# Patient Record
Sex: Female | Born: 1990
Health system: Southern US, Community
[De-identification: ages and names within clinical notes are randomized; demographics above are authoritative.]

## PROBLEM LIST (undated history)

## (undated) DIAGNOSIS — A749 Chlamydial infection, unspecified: Secondary | ICD-10-CM

## (undated) DIAGNOSIS — Z23 Encounter for immunization: Secondary | ICD-10-CM

## (undated) DIAGNOSIS — F418 Other specified anxiety disorders: Secondary | ICD-10-CM

## (undated) DIAGNOSIS — O139 Gestational [pregnancy-induced] hypertension without significant proteinuria, unspecified trimester: Secondary | ICD-10-CM

## (undated) DIAGNOSIS — C50919 Malignant neoplasm of unspecified site of unspecified female breast: Secondary | ICD-10-CM

## (undated) HISTORY — DX: Gestational (pregnancy-induced) hypertension without significant proteinuria, unspecified trimester: O13.9

## (undated) HISTORY — DX: Encounter for immunization: Z23

## (undated) HISTORY — PX: AUGMENTATION MAMMAPLASTY: SUR837

## (undated) HISTORY — DX: Other specified anxiety disorders: F41.8

## (undated) HISTORY — DX: Chlamydial infection, unspecified: A74.9

## (undated) HISTORY — PX: TONSILLECTOMY: SUR1361

## (undated) MED FILL — Fosaprepitant Dimeglumine For IV Infusion 150 MG (Base Eq): INTRAVENOUS | Qty: 5 | Status: AC

---
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
## 2013-01-15 ENCOUNTER — Observation Stay: Payer: Self-pay | Admitting: Obstetrics and Gynecology

## 2013-02-23 ENCOUNTER — Observation Stay: Payer: Self-pay | Admitting: Obstetrics and Gynecology

## 2013-02-23 LAB — PIH PROFILE
Anion Gap: 6 — ABNORMAL LOW (ref 7–16)
BUN: 3 mg/dL — ABNORMAL LOW (ref 7–18)
Co2: 22 mmol/L (ref 21–32)
Creatinine: 0.58 mg/dL — ABNORMAL LOW (ref 0.60–1.30)
EGFR (Non-African Amer.): >60
Glucose: 78 mg/dL (ref 65–99)
HCT: 32.2 % — ABNORMAL LOW (ref 35.0–47.0)
MCH: 28.4 pg (ref 26.0–34.0)
Osmolality: 271 (ref 275–301)
RBC: 3.81 10*6/uL (ref 3.80–5.20)
RDW: 12.5 % (ref 11.5–14.5)
Sodium: 138 mmol/L (ref 136–145)
Uric Acid: 4.1 mg/dL (ref 2.6–6.0)

## 2013-02-23 LAB — PROTEIN / CREATININE RATIO, URINE
Creatinine, Urine: 72.5 mg/dL (ref 30.0–125.0)
Protein, Random Urine: 16 mg/dL — ABNORMAL HIGH (ref 0–12)

## 2013-03-01 ENCOUNTER — Observation Stay: Payer: Self-pay

## 2013-03-01 LAB — PIH PROFILE
Anion Gap: 6 — ABNORMAL LOW (ref 7–16)
Calcium, Total: 8.5 mg/dL (ref 8.5–10.1)
Co2: 25 mmol/L (ref 21–32)
Creatinine: 0.71 mg/dL (ref 0.60–1.30)
EGFR (African American): >60
Glucose: 72 mg/dL (ref 65–99)
HCT: 30.4 % — ABNORMAL LOW (ref 35.0–47.0)
HGB: 10.5 g/dL — ABNORMAL LOW (ref 12.0–16.0)
MCH: 28.3 pg (ref 26.0–34.0)
MCHC: 34.6 g/dL (ref 32.0–36.0)
MCV: 82 fL (ref 80–100)
Osmolality: 271 (ref 275–301)
Platelet: 202 10*3/uL (ref 150–440)
RDW: 12.8 % (ref 11.5–14.5)
WBC: 15.8 10*3/uL — ABNORMAL HIGH (ref 3.6–11.0)

## 2013-03-01 LAB — PROTEIN / CREATININE RATIO, URINE: Protein/Creat. Ratio: 240 mg/gCREAT — ABNORMAL HIGH (ref 0–200)

## 2013-03-04 ENCOUNTER — Observation Stay: Payer: Self-pay | Admitting: Obstetrics and Gynecology

## 2013-03-04 LAB — PIH PROFILE
BUN: 4 mg/dL — ABNORMAL LOW (ref 7–18)
Chloride: 106 mmol/L (ref 98–107)
Co2: 24 mmol/L (ref 21–32)
Creatinine: 0.58 mg/dL — ABNORMAL LOW (ref 0.60–1.30)
EGFR (Non-African Amer.): >60
Glucose: 98 mg/dL (ref 65–99)
MCH: 28.2 pg (ref 26.0–34.0)
MCHC: 34.3 g/dL (ref 32.0–36.0)
Platelet: 207 10*3/uL (ref 150–440)
RBC: 3.54 10*6/uL — ABNORMAL LOW (ref 3.80–5.20)
RDW: 12.9 % (ref 11.5–14.5)
Uric Acid: 4.9 mg/dL (ref 2.6–6.0)
WBC: 15.3 10*3/uL — ABNORMAL HIGH (ref 3.6–11.0)

## 2013-03-04 LAB — PROTEIN / CREATININE RATIO, URINE
Creatinine, Urine: 115.2 mg/dL (ref 30.0–125.0)
Protein, Random Urine: 29 mg/dL — ABNORMAL HIGH (ref 0–12)

## 2013-03-05 LAB — PROTEIN, URINE, 24 HOUR
Collection Hours: 24 hours
Protein, Urine: 23 mg/dL (ref 0–12)
Total Volume: 1200 mL

## 2013-03-10 ENCOUNTER — Inpatient Hospital Stay: Payer: Self-pay

## 2013-03-11 LAB — BASIC METABOLIC PANEL
Anion Gap: 7 (ref 7–16)
BUN: 4 mg/dL — ABNORMAL LOW (ref 7–18)
Calcium, Total: 8.5 mg/dL (ref 8.5–10.1)
Co2: 26 mmol/L (ref 21–32)
Creatinine: 0.66 mg/dL (ref 0.60–1.30)
EGFR (Non-African Amer.): >60
Glucose: 72 mg/dL (ref 65–99)
Potassium: 3.2 mmol/L — ABNORMAL LOW (ref 3.5–5.1)

## 2013-03-11 LAB — SGOT (AST)(ARMC): SGOT(AST): 12 U/L — ABNORMAL LOW (ref 15–37)

## 2013-03-11 LAB — PROTEIN / CREATININE RATIO, URINE
Creatinine, Urine: 23.2 mg/dL — ABNORMAL LOW (ref 30.0–125.0)
Protein, Random Urine: 8 mg/dL (ref 0–12)

## 2013-03-11 LAB — CBC WITH DIFFERENTIAL/PLATELET
Eosinophil #: 0.1 10*3/uL (ref 0.0–0.7)
Eosinophil %: 0.6 %
HCT: 32.8 % — ABNORMAL LOW (ref 35.0–47.0)
HGB: 11.1 g/dL — ABNORMAL LOW (ref 12.0–16.0)
MCH: 27.4 pg (ref 26.0–34.0)
MCV: 81 fL (ref 80–100)
Monocyte #: 1.6 x10 3/mm — ABNORMAL HIGH (ref 0.2–0.9)
Monocyte %: 7 %
Neutrophil %: 83 %
Platelet: 214 10*3/uL (ref 150–440)
WBC: 22.2 10*3/uL — ABNORMAL HIGH (ref 3.6–11.0)

## 2013-03-14 LAB — CBC
MCH: 27.3 pg (ref 26.0–34.0)
MCV: 83 fL (ref 80–100)

## 2013-03-14 LAB — GENTAMICIN LEVEL, RANDOM: Gentamicin, Random: 1.5 ug/mL

## 2013-03-15 LAB — CBC WITH DIFFERENTIAL/PLATELET
Basophil #: 0 10*3/uL (ref 0.0–0.1)
Basophil %: 0.2 %
Eosinophil #: 0.6 10*3/uL (ref 0.0–0.7)
Eosinophil %: 3.6 %
HCT: 28.2 % — ABNORMAL LOW (ref 35.0–47.0)
HGB: 9.5 g/dL — ABNORMAL LOW (ref 12.0–16.0)
Lymphocyte %: 16.5 %
MCH: 27.2 pg (ref 26.0–34.0)
MCHC: 33.6 g/dL (ref 32.0–36.0)
Monocyte #: 1.3 x10 3/mm — ABNORMAL HIGH (ref 0.2–0.9)
Monocyte %: 7.4 %
Platelet: 257 10*3/uL (ref 150–440)
RBC: 3.48 10*6/uL — ABNORMAL LOW (ref 3.80–5.20)
RDW: 13.3 % (ref 11.5–14.5)
WBC: 17.8 10*3/uL — ABNORMAL HIGH (ref 3.6–11.0)

## 2013-06-24 HISTORY — PX: BREAST ENHANCEMENT SURGERY: SHX7

## 2014-10-14 NOTE — Op Note (Signed)
PATIENT NAME:  Eileen Hart, RIPP MR#:  [id number] DATE OF BIRTH:  December 30, 1990  DATE OF PROCEDURE:  03/12/2013  PREOPERATIVE DIAGNOSES: 1.  Term intrauterine pregnancy at 38 weeks, 5 days.  2.  Induction of labor secondary to gestational hypertension.  3.  Failure to progress with arrest of dilation at 4 cm.   POSTOPERATIVE DIAGNOSES: 1.  Term intrauterine pregnancy at 38 weeks, 5 days.  2.  Induction of labor secondary to gestational hypertension.  3.  Failure to progress with arrest of dilation at 4 cm.  4.  Asynclitic presentation of the fetus and nuchal cord x 2.   PROCEDURE PERFORMED:  Low transverse cesarean section via Pfannenstiel skin incision.   ANESTHESIA USED:  Spinal.   PRIMARY SURGEON:  Vena Austria, M.D.   ASSISTANT:  Babette Relic Brother, certified nurse midwife.   ESTIMATED BLOOD LOSS:  600 mL.   OPERATIVE FLUIDS:  500 mL.   URINE OUTPUT:  550 mL.   PREOPERATIVE ANTIBIOTICS:  2 grams of Ancef.   DRAINS OR TUBES:  Foley to gravity drainage, On-Q catheter system.   IMPLANTS:  None.   SPECIMENS REMOVED:  None.   COMPLICATIONS:  None.   INTRAOPERATIVE FINDINGS:  Normal tubes, uterus and ovaries.  Delivery resulted in the birth of a liveborn female infant weighing 2950 grams, 6 pounds, 8 ounces, Apgars 8 and 9.  There was a loose nuchal cord x 2 as well as a caput to the right of the midline suggesting asynclitic presentation.   THE PATIENT CONDITION FOLLOWING THE PROCEDURE:  Stable.   PROCEDURE IN DETAIL:  Risks, benefits and alternatives of the procedure were discussed with the patient prior to proceeding to the Operating Room.  The patient was taken to the Operating Room where spinal anesthesia was administered.  She was prepped and draped in the usual sterile fashion.  A timeout procedure was performed.  The level of anesthetic was checked and noted to be adequate before proceeding with a Pfannenstiel skin incision 2 cm above the pubic symphysis.  The incision was  carried down sharply to the level of the rectus fascia using the knife.  The fascia was incised in the midline and then extended using Mayo scissors.  The superior border of the rectus fascia was grasped with two Coker clamps.  The underlying rectus fascia and the median raphe were taken down bluntly.  The inferior border of the rectus fascia was dissected off the rectus muscle in a similar fashion with the median raphe being incised with Mayo scissors.  The midline was identified.  The peritoneum was entered bluntly.  The peritoneal incision was then extended using manual traction.  A bladder blade was placed.  Next, a bladder flap was created using Metzenbaum scissors and further developed digitally.  The bladder blade was then replaced displacing the bladder caudad.  Following this, a low transverse incision was made on the uterus.  The hysterotomy incision was entered bluntly using a hemostat.  The incision was extended using manual traction.  Upon placing the operator's hand into the hysterotomy, the infant was noted to be in the OA position, slightly asynclitic.  The vertex was grasped, flexed, brought to the incision and delivered atraumatically using fundal pressure.  There is two loose nuchal cords which were reduced prior to delivery of the body.  The infant was suctioned, cord was clamped and cut and the infant was passed to the awaiting pediatricians.  Cord blood was obtained.  The placenta was delivered using manual  extraction.  The uterus was then exteriorized, wiped clean of clots and debris using two moist laps.  The hysterotomy incision was repaired using a two layer closure of 0 Vicryl with the first being a running lock, the second a vertical imbricating.  The uterus was then returned to the abdomen.  The peritoneal gutters were wiped clean of clots and debris using two moist laps.  The hysterotomy incision was reinspected and noted to be hemostatic.  The peritoneum was closed using a running 2-0  Vicryl.  Following this, the superior border of the rectus fascia was grasped with Kocher clamps and the On-Q trocars were placed 4 cm above the incision in the midline.  The catheters were then threaded through the introducers before removing the introducers.  The fascia was then closed using a running #1 looped PDS.  The subcutaneous tissue was irrigated.  Hemostasis was achieved using the Bovie.  Skin was closed using 4-0 Monocryl.  The incision was then dressed with Steri-Strips.  The On-Q catheters were bolused with 5 mL of 0.5% bupivacaine each.  Sponge, needle, instrument counts were correct x 2.  The patient tolerated the procedure well and was taken to the recovery room in stable condition.    ____________________________ Florina OuAndreas M. Bonney AidStaebler, MD ams:ea D: 03/12/2013 01:46:15 ET T: 03/12/2013 02:26:38 ET JOB#: [id number]  cc: Florina OuAndreas M. Bonney AidStaebler, MD, <Dictator> Lorrene ReidANDREAS M Landri Dorsainvil MD ELECTRONICALLY SIGNED 03/16/2013 20:12

## 2014-11-01 NOTE — H&P (Signed)
L&D Evaluation:  History:  HPI 24 yo G1 at 7955w4d by D=8wk US derived EDC of 03/21/13 presenting from clinic for evalution of PIH.  Patient has had elevated BP's in the third trimester.  Labs and protein/creatnine ratio have been normal.  She has not undergone a formal 24-hr urine protein collection.  Patient has been started on labetalol but did not take today.  No LOF, no VB, no ctx, +FM.  Denies HA, vision changes, RUQ/epigastric pain, or increased swelling.    PNL O neg /  1st trimester screen negative / 1-hr 92 / GBS negative   Presents with elevated BP in clinic   Patient's Medical History No Chronic Illness   Patient's Surgical History none   Medications Pre Natal Vitamins  labetalol 100mg  po bid   Allergies NKDA   Social History none   Family History Non-Contributory   ROS:  ROS All systems were reviewed.  HEENT, CNS, GI, GU, Respiratory, CV, Renal and Musculoskeletal systems were found to be normal.   Exam:  Vital Signs 151/85; 149/89; 139/99; 136/82; 146/89   Urine Protein not completed   General no apparent distress   Mental Status clear   Chest clear   Abdomen gravid, non-tender   Estimated Fetal Weight Average for gestational age   Fetal Position vtx   Back no CVAT   Edema no edema   Reflexes 1+   Clonus negative   Pelvic no external lesions, 1/30/-3   Mebranes Intact   FHT normal rate with no decels, 145, moderate, +accels, no decels   Ucx irregular   Impression:  Impression evaluation for PIH   Plan:  Plan EFM/NST, PIH panel   Comments 1) GHTN - Pr/Cr ratio remains normal range, PIH labs normal     - outpatient 24-hr urine collection     - discussed ok not to continue labetalol if peristently severe range BP's would recommend delivery, at present mild range off of medications     - Plan IOL at 5419w3d on 03/10/2013 cervidil  2) Fetus - category I tracing, normal AFI today  3) Disposition - has repeat NST/AFI on 9/15   Electronic  Signatures: Lorrene ReidStaebler, Aireal Slater M (MD)  (Signed 11-Sep-14 15:32)  Authored: L&D Evaluation   Last Updated: 11-Sep-14 15:32 by Lorrene ReidStaebler, Calum Cormier M (MD)

## 2015-12-29 ENCOUNTER — Encounter: Payer: Self-pay | Admitting: Family Medicine

## 2015-12-29 ENCOUNTER — Ambulatory Visit (INDEPENDENT_AMBULATORY_CARE_PROVIDER_SITE_OTHER): Payer: BLUE CROSS/BLUE SHIELD | Admitting: Family Medicine

## 2015-12-29 VITALS — BP 102/68 | HR 70 | Temp 98.5°F | Ht 64.0 in | Wt 114.8 lb

## 2015-12-29 DIAGNOSIS — M2142 Flat foot [pes planus] (acquired), left foot: Secondary | ICD-10-CM | POA: Diagnosis not present

## 2015-12-29 DIAGNOSIS — M25562 Pain in left knee: Secondary | ICD-10-CM

## 2015-12-29 DIAGNOSIS — F419 Anxiety disorder, unspecified: Secondary | ICD-10-CM

## 2015-12-29 DIAGNOSIS — M2141 Flat foot [pes planus] (acquired), right foot: Secondary | ICD-10-CM

## 2015-12-29 DIAGNOSIS — F418 Other specified anxiety disorders: Secondary | ICD-10-CM

## 2015-12-29 DIAGNOSIS — F32A Depression, unspecified: Secondary | ICD-10-CM | POA: Insufficient documentation

## 2015-12-29 DIAGNOSIS — F329 Major depressive disorder, single episode, unspecified: Secondary | ICD-10-CM

## 2015-12-29 NOTE — Assessment & Plan Note (Signed)
Patient with recurrence of anxiety and depression. Some thoughts of being better off if not waking up though no intent or plan to harm herself. Discussed medication and therapy. Patient opted for seeking therapist. She is given names of local therapists to contact. She is given return precautions.

## 2015-12-29 NOTE — Patient Instructions (Signed)
Nice to see you. We are going to refer you to sports medicine for evaluation of your flat feet and consideration of orthotics.  I have provided you with a list of names of therapists in the area. Please contact them and see which ones take her insurance. If you develop worsening anxiety or depression, thoughts of harming herself or others, or any new or changing symptoms please seek medical attention.

## 2015-12-29 NOTE — Assessment & Plan Note (Signed)
Patient with an acute episode of left knee pain over her patella. No obvious cause. No abnormalities of the knees noted on exam. No tenderness of the patella. Unsure of cause at this time. She does have flat feet and eversion of her feet at her ankles which could place undue stress on her knees. Discussed having her evaluated by sports medicine for this for potential custom orthotics. Referral will be placed. She will continue to monitor for recurrence.

## 2015-12-29 NOTE — Progress Notes (Signed)
Pre visit review using our clinic review tool, if applicable. No additional management support is needed unless otherwise documented below in the visit note. 

## 2015-12-29 NOTE — Progress Notes (Signed)
Patient ID: Eileen Hart, female   DOB: 04-Apr-1991, 25 y.o.   MRN: [id number]  Marikay AlarEric Danylle Ouk, MD Phone: [phone]  Eileen CampHolly L Calabretta is a 25 y.o. female who presents today for new patient visit.  Anxiety/depression: Patient notes for the last several months she has felt some anxiety and depression. Notes history of postpartum depression 3 years ago with the birth of her son. Was tried on Zoloft at that time though did not react well to it. She notes she is at baseline and anxious person. She does have thoughts about whether or not it would be better if she just didn't wake up though has no plan or intent to harm herself. Is interested in seeing a therapist.  Depression screen PHQ 2/9 12/29/2015  Decreased Interest 2  Down, Depressed, Hopeless 2  PHQ - 2 Score 4  Altered sleeping 1  Tired, decreased energy 3  Change in appetite 2  Feeling bad or failure about yourself  2  Trouble concentrating 1  Moving slowly or fidgety/restless 0  Suicidal thoughts 0  PHQ-9 Score 13  Difficult doing work/chores Somewhat difficult   GAD 7 : Generalized Anxiety Score 12/29/2015  Nervous, Anxious, on Edge 3  Control/stop worrying 3  Worry too much - different things 3  Trouble relaxing 1  Restless 1  Easily annoyed or irritable 3  Afraid - awful might happen 3  Total GAD 7 Score 17  Anxiety Difficulty Extremely difficult    Left knee pain: Patient notes last week she was walking outside of work when she felt a pop and then had a swollen area and bruised area over her patella. Notes there is pain over this area. All this resolved within 2 days. No pain at this time. Has a history of tendinitis in her knees. Notes chronic achey discomfort in her knees. Does note she has flat feet and occasional achey discomfort in her bilateral feet. Does have insoles though these were not necessarily custom fit for her. No giving out of her knees.  Active Ambulatory Problems    Diagnosis Date Noted  . Anxiety and  depression 12/29/2015  . Knee pain, left 12/29/2015   Resolved Ambulatory Problems    Diagnosis Date Noted  . No Resolved Ambulatory Problems   Past Medical History  Diagnosis Date  . Depression   . Gestational hypertension     Family History  Problem Relation Age of Onset  . Breast cancer Other     Social History   Social History  . Marital Status: Single    Spouse Name: N/A  . Number of Children: N/A  . Years of Education: N/A   Occupational History  . Not on file.   Social History Main Topics  . Smoking status: Current Every Day Smoker  . Smokeless tobacco: Not on file  . Alcohol Use: No  . Drug Use: No  . Sexual Activity: Not on file   Other Topics Concern  . Not on file   Social History Narrative  . No narrative on file    ROS  General:  Negative for nexplained weight loss, fever Skin: Negative for new or changing mole, sore that won't heal HEENT: Negative for trouble hearing, trouble seeing, ringing in ears, mouth sores, hoarseness, change in voice, dysphagia. CV:  Negative for chest pain, dyspnea, edema, palpitations Resp: Negative for cough, dyspnea, hemoptysis GI: Negative for nausea, vomiting, diarrhea, constipation, abdominal pain, melena, hematochezia. GU: Negative for dysuria, incontinence, urinary hesitance, hematuria, vaginal or penile  discharge, polyuria, sexual difficulty, lumps in testicle or breasts MSK: Negative for muscle cramps or aches, joint pain or swelling Neuro: Negative for headaches, weakness, numbness, dizziness, passing out/fainting Psych: Positive for depression, anxiety, negative for memory problems  Objective  Physical Exam Filed Vitals:   12/29/15 1048  BP: 102/68  Pulse: 70  Temp: 98.5 F (36.9 C)    BP Readings from Last 3 Encounters:  12/29/15 102/68   Wt Readings from Last 3 Encounters:  12/29/15 114 lb 12.8 oz (52.073 kg)    Physical Exam  Constitutional: She is well-developed, well-nourished, and in no  distress.  HENT:  Head: Normocephalic and atraumatic.  Right Ear: External ear normal.  Left Ear: External ear normal.  Eyes: Conjunctivae are normal. Pupils are equal, round, and reactive to light.  Cardiovascular: Normal rate, regular rhythm and normal heart sounds.   Pulmonary/Chest: Effort normal and breath sounds normal.  Abdominal: Soft. Bowel sounds are normal. She exhibits no distension. There is no tenderness. There is no rebound and no guarding.  Musculoskeletal:  Bilateral knees with no swelling, tenderness, bruising, warmth, or erythema, no ligamentous laxity, negative McMurray's, has planus noted bilaterally with eversion of feet when standing flat  Neurological: She is alert. Gait normal.  5 out of 5 strength bilateral quads, hamstrings, plantar flexion, and dorsiflexion, sensation light touch intact in bilateral lower extremities, 2+ patellar reflexes  Skin: Skin is warm and dry.  Psychiatric:  Affect normal, mood anxious and depressed     Assessment/Plan:   Anxiety and depression Patient with recurrence of anxiety and depression. Some thoughts of being better off if not waking up though no intent or plan to harm herself. Discussed medication and therapy. Patient opted for seeking therapist. She is given names of local therapists to contact. She is given return precautions.  Knee pain, left Patient with an acute episode of left knee pain over her patella. No obvious cause. No abnormalities of the knees noted on exam. No tenderness of the patella. Unsure of cause at this time. She does have flat feet and eversion of her feet at her ankles which could place undue stress on her knees. Discussed having her evaluated by sports medicine for this for potential custom orthotics. Referral will be placed. She will continue to monitor for recurrence.    Orders Placed This Encounter  Procedures  . Ambulatory referral to Sports Medicine    Referral Priority:  Routine    Referral  Type:  Consultation    Number of Visits Requested:  1    Marikay AlarEric Shayli Altemose, MD [facility] [place]

## 2016-01-12 ENCOUNTER — Encounter: Payer: Self-pay | Admitting: Sports Medicine

## 2016-01-12 ENCOUNTER — Ambulatory Visit (INDEPENDENT_AMBULATORY_CARE_PROVIDER_SITE_OTHER): Payer: BLUE CROSS/BLUE SHIELD | Admitting: Sports Medicine

## 2016-01-12 VITALS — BP 134/79 | HR 53 | Ht 64.0 in | Wt 114.0 lb

## 2016-01-12 DIAGNOSIS — M2142 Flat foot [pes planus] (acquired), left foot: Secondary | ICD-10-CM | POA: Diagnosis not present

## 2016-01-12 DIAGNOSIS — M2141 Flat foot [pes planus] (acquired), right foot: Secondary | ICD-10-CM

## 2016-01-12 NOTE — Progress Notes (Signed)
   Subjective:    Patient ID: Eileen Hart, female    DOB: January 04, 1991, 25 y.o.   MRN: [id number]  HPI chief complaint: Bilateral foot pain and left knee pain  Very pleasant 25 year old female comes in today with a couple of different complaints. First is her left knee. About 3 weeks ago she began to experience some stinging type pain in the anterior patella which was followed shortly thereafter by some swelling. She then began to notice ecchymosis and at that same time her swelling resolved. Her pain also improved as the swelling dissipated. Today she is pain-free. She denies any trauma to the area. Only problem she has had in her knee in the past was with tendinitis. She has a picture on her cell phone of her knee which shows quite an extensive area of ecchymosis over the anterior knee. She denies instability. No mechanical symptoms. No fevers or chills.  Second issue is with her feet. She has intermittent pain that she localizes to the arch of each foot. 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 She has tried some off-the-shelf orthotics as well as some more rigid custom orthotics. The rigid orthotics were uncomfortable but the off-the-shelf orthotics were softer and more tolerable but she has misplaced them. She denies any prior surgery to her foot or ankle in the past. She denies any numbness or tingling. She is here today with her mom.  Past medical history reviewed Medications reviewed Allergies reviewed    Review of Systems As above    Objective:   Physical Exam  Well-developed, well-nourished. No acute distress. Awake alert and oriented 3. Vital signs reviewed  Left knee: Full range of motion. No effusion. No prepatellar swelling. No tenderness to palpation. Good joint stability.  Examination of each foot in the standing position shows moderate pes planus with calcaneal valgus. No tenderness to palpation. No  swelling. Good pulses. Walking without a limp.      Assessment & Plan:   Resolved left knee pain and swelling-question hemorrhagic prepatellar bursitis Pes planus  The etiology of her left knee pain and swelling is not straightforward. It is possible that she had a hemorrhagic prepatellar bursitis but she denies any trauma. The ecchymosis seen on her cell phone picture has certainly resolved and today she is pain-free. I do not think we need to work this up any further but I do want her to return to the office if she experiences any returning swelling/ecchymosis. For her feet, I've given her some green sports insoles with scaphoid pads. If she finds them to be comfortable then we could consider custom orthotics at her follow-up visit in 4 weeks. No restriction on activity. Call with questions or concerns prior to her follow-up visit.

## 2016-02-19 ENCOUNTER — Ambulatory Visit: Payer: BLUE CROSS/BLUE SHIELD | Admitting: Sports Medicine

## 2016-03-01 ENCOUNTER — Encounter (INDEPENDENT_AMBULATORY_CARE_PROVIDER_SITE_OTHER): Payer: Self-pay

## 2016-03-01 ENCOUNTER — Ambulatory Visit (INDEPENDENT_AMBULATORY_CARE_PROVIDER_SITE_OTHER): Payer: BLUE CROSS/BLUE SHIELD | Admitting: Family Medicine

## 2016-03-01 VITALS — BP 102/64 | HR 97 | Temp 98.3°F | Wt 117.8 lb

## 2016-03-01 DIAGNOSIS — F419 Anxiety disorder, unspecified: Principal | ICD-10-CM

## 2016-03-01 DIAGNOSIS — F418 Other specified anxiety disorders: Secondary | ICD-10-CM | POA: Diagnosis not present

## 2016-03-01 DIAGNOSIS — F32A Depression, unspecified: Secondary | ICD-10-CM

## 2016-03-01 DIAGNOSIS — R5383 Other fatigue: Secondary | ICD-10-CM | POA: Diagnosis not present

## 2016-03-01 DIAGNOSIS — F329 Major depressive disorder, single episode, unspecified: Secondary | ICD-10-CM

## 2016-03-01 LAB — TSH: TSH: 0.98 u[IU]/mL (ref 0.35–4.50)

## 2016-03-01 LAB — VITAMIN B12: VITAMIN B 12: 212 pg/mL (ref 211–911)

## 2016-03-01 MED ORDER — CITALOPRAM HYDROBROMIDE 20 MG PO TABS: 20.0000 mg | ORAL_TABLET | Freq: Every day | ORAL | 3 refills | Status: DC

## 2016-03-01 NOTE — Progress Notes (Signed)
Pre visit review using our clinic review tool, if applicable. No additional management support is needed unless otherwise documented below in the visit note. 

## 2016-03-01 NOTE — Patient Instructions (Signed)
Nice to see you. We are going to start on Celexa for your anxiety and depression. We will also check some lab work and call you with the results. If you develop worsening anxiety or depression, thoughts of harming your self, or any new or changing symptoms please seek medical attention.

## 2016-03-01 NOTE — Assessment & Plan Note (Addendum)
No improvement. Has not been treated in any manner. We will start on Celexa. Check TSH and B12. Advised if she gets pregnant she will need to let us and her OB know to discuss whether to continue this medication. Given return precautions.

## 2016-03-01 NOTE — Progress Notes (Signed)
  Marikay AlarEric Tiyona Desouza, MD Phone: [phone]  Eileen CampHolly L Hart is a 25 y.o. female who presents today for follow-up.  Anxiety and depression: Patient notes she was not able to afford therapy given her deductible is quite high. She is interested in starting a medication. She's previously been on Zoloft and Klonopin. She notes Klonopin made her quite drowsy. She notes feeling depressed and having little interest or pleasure in doing things. She has decreased energy. Notes poor appetite. Also having trouble concentrating. Notes significant anxiety as well. She wonders if B12 deficiency or thyroid issues could be playing a role. She notes her grandmother has a history of thyroid dysfunction and B12 deficiency.  ROS see history of present illness  Objective  Physical Exam Vitals:   03/01/16 0850  BP: 102/64  Pulse: 97  Temp: 98.3 F (36.8 C)    BP Readings from Last 3 Encounters:  03/01/16 102/64  01/12/16 134/79  12/29/15 102/68   Wt Readings from Last 3 Encounters:  03/01/16 117 lb 12.8 oz (53.4 kg)  01/12/16 114 lb (51.7 kg)  12/29/15 114 lb 12.8 oz (52.1 kg)    Physical Exam  Constitutional: No distress.  HENT:  Head: Normocephalic and atraumatic.  Cardiovascular: Normal rate, regular rhythm and normal heart sounds.   Pulmonary/Chest: Effort normal and breath sounds normal.  Neurological: She is alert. Gait normal.  Skin: She is not diaphoretic.  Psychiatric:  Mood anxious, affect normal     Assessment/Plan: Please see individual problem list.  Anxiety and depression No improvement. Has not been treated in any manner. We will start on Celexa. Check TSH and B12. Advised if she gets pregnant she will need to let us and her OB know to discuss whether to continue this medication. Given return precautions.   Orders Placed This Encounter  Procedures  . TSH  . B12    Meds ordered this encounter  Medications  . citalopram (CELEXA) 20 MG tablet    Sig: Take 1 tablet (20 mg  total) by mouth daily.    Dispense:  30 tablet    Refill:  3    Marikay AlarEric Analisia Kingsford, MD [facility] [place]

## 2016-03-22 ENCOUNTER — Other Ambulatory Visit: Payer: Self-pay | Admitting: Surgical

## 2016-03-22 DIAGNOSIS — F419 Anxiety disorder, unspecified: Principal | ICD-10-CM

## 2016-03-22 DIAGNOSIS — F329 Major depressive disorder, single episode, unspecified: Secondary | ICD-10-CM

## 2016-03-22 DIAGNOSIS — F32A Depression, unspecified: Secondary | ICD-10-CM

## 2016-03-22 MED ORDER — CITALOPRAM HYDROBROMIDE 20 MG PO TABS: 20.0000 mg | ORAL_TABLET | Freq: Every day | ORAL | 0 refills | Status: DC

## 2016-05-02 ENCOUNTER — Encounter: Payer: Self-pay | Admitting: Family Medicine

## 2016-05-02 ENCOUNTER — Ambulatory Visit (INDEPENDENT_AMBULATORY_CARE_PROVIDER_SITE_OTHER): Payer: BLUE CROSS/BLUE SHIELD | Admitting: Family Medicine

## 2016-05-02 DIAGNOSIS — Z720 Tobacco use: Secondary | ICD-10-CM | POA: Insufficient documentation

## 2016-05-02 DIAGNOSIS — F418 Other specified anxiety disorders: Secondary | ICD-10-CM

## 2016-05-02 DIAGNOSIS — F32A Depression, unspecified: Secondary | ICD-10-CM

## 2016-05-02 DIAGNOSIS — F329 Major depressive disorder, single episode, unspecified: Secondary | ICD-10-CM

## 2016-05-02 DIAGNOSIS — F419 Anxiety disorder, unspecified: Principal | ICD-10-CM

## 2016-05-02 HISTORY — DX: Tobacco use: Z72.0

## 2016-05-02 MED ORDER — CITALOPRAM HYDROBROMIDE 40 MG PO TABS: 40.0000 mg | ORAL_TABLET | Freq: Every day | ORAL | 1 refills | Status: DC

## 2016-05-02 NOTE — Assessment & Plan Note (Signed)
Discussed tobacco cessation. Not currently interested. Advised that if she becomes interested she can let us know.

## 2016-05-02 NOTE — Patient Instructions (Signed)
Nice to see you. We are going to increase your Celexa to 40 mg daily. You can take two 20 mg tablets daily until you runs out of your current prescription. If your anxiety worsens or your depression worsens or you develop thoughts of harming yourself or others please seek medical attention immediately.
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
## 2016-05-02 NOTE — Assessment & Plan Note (Addendum)
Depression improved. Anxiety is still present. No SI or HI. Offered therapy though she cannot afford this at this time. We will increase her Celexa to 40 mg daily. I discussed that Wellbutrin has an indication for depression and smoking cessation though not anxiety. She decided to defer treatment with this. She will monitor and follow up in 2 months.

## 2016-05-03 ENCOUNTER — Ambulatory Visit: Payer: BLUE CROSS/BLUE SHIELD | Admitting: Family Medicine

## 2016-06-27 ENCOUNTER — Other Ambulatory Visit: Payer: Self-pay | Admitting: Family Medicine

## 2016-06-27 DIAGNOSIS — F32A Depression, unspecified: Secondary | ICD-10-CM

## 2016-06-27 DIAGNOSIS — F419 Anxiety disorder, unspecified: Principal | ICD-10-CM

## 2016-06-27 DIAGNOSIS — F329 Major depressive disorder, single episode, unspecified: Secondary | ICD-10-CM

## 2016-07-12 ENCOUNTER — Ambulatory Visit: Payer: BLUE CROSS/BLUE SHIELD | Admitting: Family Medicine

## 2016-09-30 ENCOUNTER — Other Ambulatory Visit: Payer: Self-pay | Admitting: Family Medicine

## 2016-09-30 ENCOUNTER — Encounter: Payer: Self-pay | Admitting: Certified Nurse Midwife

## 2016-09-30 ENCOUNTER — Ambulatory Visit (INDEPENDENT_AMBULATORY_CARE_PROVIDER_SITE_OTHER): Payer: BLUE CROSS/BLUE SHIELD | Admitting: Certified Nurse Midwife

## 2016-09-30 VITALS — BP 98/58 | HR 80 | Ht 64.0 in | Wt 123.0 lb

## 2016-09-30 DIAGNOSIS — Z124 Encounter for screening for malignant neoplasm of cervix: Secondary | ICD-10-CM | POA: Diagnosis not present

## 2016-09-30 DIAGNOSIS — F32A Depression, unspecified: Secondary | ICD-10-CM

## 2016-09-30 DIAGNOSIS — Z01419 Encounter for gynecological examination (general) (routine) without abnormal findings: Secondary | ICD-10-CM | POA: Diagnosis not present

## 2016-09-30 DIAGNOSIS — Z3041 Encounter for surveillance of contraceptive pills: Secondary | ICD-10-CM

## 2016-09-30 DIAGNOSIS — Z113 Encounter for screening for infections with a predominantly sexual mode of transmission: Secondary | ICD-10-CM

## 2016-09-30 DIAGNOSIS — F329 Major depressive disorder, single episode, unspecified: Secondary | ICD-10-CM

## 2016-09-30 DIAGNOSIS — F419 Anxiety disorder, unspecified: Principal | ICD-10-CM

## 2016-09-30 MED ORDER — NORGESTIMATE-ETH ESTRADIOL 0.25-35 MG-MCG PO TABS: 1.0000 | ORAL_TABLET | Freq: Every day | ORAL | 11 refills | Status: DC

## 2016-09-30 NOTE — Progress Notes (Signed)
Gynecology Annual Exam  PCP: Marikay Alar, MD  Chief Complaint:  Chief Complaint  Patient presents with  . Gynecologic Exam    History of Present Illness Eileen Hart is a 26 year old Caucasian/White female, G2 P1011, who presents for her annual exam. She is having no significant GYN problems.  She ran out of her Sprintec 1 month ago and would like a refill. Her menses have been regular on the pill,  They come every month, last 4-6 days and the flow is lite to medium. Her LMP is 09/26/2016  She has had no spotting.  She denies dysmenorrhea.  The patient's past medical history is notable for a history of breast augmentation surgery in 2015. Her last annual exam was in 06/28/2015 at which time her Pap smear was NIL.   She is sexually active. She is currently using condoms for contraception.   Mammogram is not applicable.  There is a positive history of breast cancer in her maternal second cousin. Genetic testing has not been done.  There is no family history of ovarian cancer.  The patient does do monthly self breast exams.  The patient smokes < 1/2 PPD for "stress relief" Plans on quitting once her partner graduates from college (currently cares for child on her own Mon-Fri)  The patient does drink infrequently.  The patient does not use illegal drugs.  The patient does not exercise but leads an active life.  The patient does not get adequate calcium in her diet.  She has not had a recent cholesterol screen and is not interested in labwork.  She has also been off her Celexa (forgetting to take) and doing well off the Celexa. Wanted to restart the medication, but unsure what dose to take. Felt no different on the 40 mgm then on the 20 mgm.   Review of Systems: Review of Systems  Constitutional: Negative for chills, fever and weight loss.  HENT: Negative for congestion, sinus pain and sore throat.   Eyes: Negative for blurred vision and pain.  Respiratory: Negative for hemoptysis,  shortness of breath and wheezing.   Cardiovascular: Negative for chest pain, palpitations and leg swelling.  Gastrointestinal: Negative for abdominal pain, blood in stool, diarrhea, heartburn, nausea and vomiting.  Genitourinary: Negative for dysuria, frequency, hematuria and urgency.  Musculoskeletal: Negative for back pain, joint pain and myalgias.  Skin: Negative for itching and rash.  Neurological: Negative for dizziness, tingling and headaches.  Endo/Heme/Allergies: Negative for environmental allergies and polydipsia. Does not bruise/bleed easily.       Negative for hirsutism   Psychiatric/Behavioral: Negative for depression. The patient is not nervous/anxious and does not have insomnia.      Past Medical History:  Past Medical History:  Diagnosis Date  . Depression   . Gestational hypertension     Past Surgical History:  Past Surgical History:  Procedure Laterality Date  . BREAST ENHANCEMENT SURGERY  2015  . TONSILLECTOMY      Medications: Prior to Admission medications   Medication Sig Start Date End Date Taking? Authorizing Provider  citalopram (CELEXA) 40 MG tablet Take 1 tablet (40 mg total) by mouth daily. Patient not taking: Reported on 09/30/2016 05/02/16   Glori Luis, MD  SPRINTEC 28 0.25-35 MG-MCG tablet TAKE 1 TABLET BY ORAL ROUTE ONCE DAILY 12/20/15   Historical Provider, MD    Allergies:  No Known Allergies  Gynecologic History: Patient's last menstrual period was 09/26/2016 (exact date). Treated for Chlamydia in 2011. Has completed her  Gardasil series.  Obstetric History: G2P1011 Hx of LTCS for FTP/ gestational hypertension  Social History:  Social History   Social History  . Marital status: Single    Spouse name: N/A  . Number of children: 1  . Years of education: N/A   Occupational History  . Groomer    Social History Main Topics  . Smoking status: Current Every Day Smoker  . Smokeless tobacco: Never Used  . Alcohol use No  . Drug use:  No  . Sexual activity: Yes    Birth control/ protection: Condom   Other Topics Concern  . Not on file   Social History Narrative  . No narrative on file    Family History:  Family History  Problem Relation Age of Onset  . Breast cancer Other      Physical Exam Vitals:  BP (!) 98/58   Pulse 80   Ht  (1.626 m)   Wt 55.8 kg (123 lb)   LMP 09/26/2016 (Exact Date)   BMI 21.11 kg/m  Physical Exam  Constitutional: She is oriented to person, place, and time. She appears well-developed and well-nourished.  HENT:  Head: Normocephalic and atraumatic.  Neck: Normal range of motion. Neck supple. No thyromegaly present.  Cardiovascular: Normal rate and regular rhythm.   No murmur heard. Respiratory: Effort normal and breath sounds normal. She has no wheezes. She has no rales. She exhibits no tenderness. Right breast exhibits no inverted nipple, no mass, no nipple discharge, no skin change and no tenderness. Left breast exhibits no inverted nipple, no mass, no nipple discharge, no skin change and no tenderness.  s/p breast augmentation. No infraclavicular or supraclavicular adenopathy  GI: Soft. She exhibits no distension and no mass. There is no tenderness. There is no rebound and no guarding.  Genitourinary: Rectum normal. Cervix exhibits no motion tenderness, no discharge and no friability. Right adnexum displays no mass and no tenderness. Left adnexum displays no mass and no tenderness.  Genitourinary Comments: Vulva: no lesions or inflammation Vagina: small amt bleeding, no lesions Cervix: nullip, NT, no lesions, blood tinged mucous Uterus: nonenlarged, normal contour Position: anteverted  mobile, non-tender Adnexa: no masses, NT   Musculoskeletal: Normal range of motion.  Lymphadenopathy:    She has no cervical adenopathy.    She has no axillary adenopathy.       Right: No inguinal adenopathy present.       Left: No inguinal adenopathy present.  Neurological: She is  alert and oriented to person, place, and time.  Skin: Skin is warm and dry. No rash noted.  Psychiatric: She has a normal mood and affect. Her behavior is normal. Judgment and thought content normal.     Assessment: 26 y.o. G2P1011 well woman exam Tobacco use  Plan:  1) Contraception : Rx for Sprintec 28 tabs with RF x11. Can restart her pills today. Advised can restart her Celexa at 20 mgm.  2)Pap and  STI screening was done.  3) Encouraged smoking cessation. "Not ready to quit."  4) Discussed recommended calcium and vitamin d3 requirements. May need to take supplement.  5) Follow up 1 year for routine annual exam    Farrel Conners, CNM  09/30/2016 1:46 PM

## 2016-10-02 LAB — PAP IG, CT-NG, RFX HPV ALL
CHLAMYDIA, NUC. ACID AMP: NEGATIVE
GONOCOCCUS BY NUCLEIC ACID AMP: NEGATIVE
PAP SMEAR COMMENT: 0

## 2016-10-08 ENCOUNTER — Other Ambulatory Visit: Payer: Self-pay | Admitting: Family Medicine

## 2016-10-08 DIAGNOSIS — F32A Depression, unspecified: Secondary | ICD-10-CM

## 2016-10-08 DIAGNOSIS — F419 Anxiety disorder, unspecified: Principal | ICD-10-CM

## 2016-10-08 DIAGNOSIS — F329 Major depressive disorder, single episode, unspecified: Secondary | ICD-10-CM

## 2016-10-08 NOTE — Telephone Encounter (Signed)
Last OV 05/02/16 last filled 05/02/16 90 1rf

## 2016-10-08 NOTE — Telephone Encounter (Signed)
It appears that she reported not taking this on 09/30/16. Please contact her to get her set up for follow-up and to find out why she was not taking it. Thanks.

## 2016-10-09 NOTE — Telephone Encounter (Signed)
Noted. I will refuse the medication then.

## 2016-10-09 NOTE — Telephone Encounter (Signed)
Patient is no longer taking this, patient is scheduled for follow up 11/25/16

## 2016-10-09 NOTE — Telephone Encounter (Signed)
Left message to return call 

## 2016-11-10 ENCOUNTER — Other Ambulatory Visit: Payer: Self-pay | Admitting: Certified Nurse Midwife

## 2016-11-25 ENCOUNTER — Ambulatory Visit (INDEPENDENT_AMBULATORY_CARE_PROVIDER_SITE_OTHER): Payer: BLUE CROSS/BLUE SHIELD | Admitting: Family Medicine

## 2016-11-25 ENCOUNTER — Encounter: Payer: Self-pay | Admitting: Family Medicine

## 2016-11-25 DIAGNOSIS — F419 Anxiety disorder, unspecified: Secondary | ICD-10-CM | POA: Diagnosis not present

## 2016-11-25 DIAGNOSIS — F329 Major depressive disorder, single episode, unspecified: Secondary | ICD-10-CM | POA: Diagnosis not present

## 2016-11-25 DIAGNOSIS — F32A Depression, unspecified: Secondary | ICD-10-CM

## 2016-11-25 DIAGNOSIS — Z720 Tobacco use: Secondary | ICD-10-CM

## 2016-11-25 MED ORDER — CITALOPRAM HYDROBROMIDE 10 MG PO TABS: 10.0000 mg | ORAL_TABLET | Freq: Every day | ORAL | 1 refills | Status: DC

## 2016-11-25 NOTE — Assessment & Plan Note (Signed)
No depression. Anxiety is still present and worse than prior. No SI or HI. We will start back on Celexa 10 mg daily. We'll see her back in 6 weeks and determine if needed go up on the dose.

## 2016-11-25 NOTE — Assessment & Plan Note (Signed)
Encouraged tobacco cessation though patient is currently not ready to quit. Advised that when she is ready she should let us know.

## 2016-11-25 NOTE — Patient Instructions (Signed)
Nice to see you. We're going to restart you on Celexa. We'll start at 10 mg daily. We'll see you back in 6 weeks.

## 2016-11-25 NOTE — Progress Notes (Signed)
  Eileen AlarEric Belmont Valli, MD Phone: [phone]  Eileen Hart is a 26 y.o. female who presents today for follow-up.  Anxiety/depression: Patient notes no depression. Notes anything and everything makes her anxious. Particularly driving and being in crowds. She stopped Celexa on her own 3-4 months ago when she started to forget to take it when she stopped taking her birth control. Notes her anxiety is a little worse over the last several months. Notes there is quite a difference when she is not on the Celexa. Those around her could tell she is much less anxious when taking it. No SI or HI.  ROS see history of present illness  Objective  Physical Exam Vitals:   11/25/16 1508  BP: 108/80  Pulse: 86  Temp: 99.3 F (37.4 C)    BP Readings from Last 3 Encounters:  11/25/16 108/80  09/30/16 (!) 98/58  05/02/16 116/74   Wt Readings from Last 3 Encounters:  11/25/16 118 lb 6.4 oz (53.7 kg)  09/30/16 123 lb (55.8 kg)  05/02/16 120 lb 12.8 oz (54.8 kg)    Physical Exam  Constitutional: She is well-developed, well-nourished, and in no distress.  Cardiovascular: Normal rate, regular rhythm and normal heart sounds.   Pulmonary/Chest: Effort normal and breath sounds normal.     Assessment/Plan: Please see individual problem list.  Anxiety and depression No depression. Anxiety is still present and worse than prior. No SI or HI. We will start back on Celexa 10 mg daily. We'll see her back in 6 weeks and determine if needed go up on the dose.  Tobacco abuse Encouraged tobacco cessation though patient is currently not ready to quit. Advised that when she is ready she should let us know.   No orders of the defined types were placed in this encounter.   Meds ordered this encounter  Medications  . citalopram (CELEXA) 10 MG tablet    Sig: Take 1 tablet (10 mg total) by mouth daily.    Dispense:  90 tablet    Refill:  1   Eileen AlarEric Brennden Masten, MD [facility] [place]

## 2017-01-13 ENCOUNTER — Ambulatory Visit (INDEPENDENT_AMBULATORY_CARE_PROVIDER_SITE_OTHER): Payer: BLUE CROSS/BLUE SHIELD | Admitting: Family Medicine

## 2017-01-13 ENCOUNTER — Encounter: Payer: Self-pay | Admitting: Family Medicine

## 2017-01-13 DIAGNOSIS — F419 Anxiety disorder, unspecified: Secondary | ICD-10-CM

## 2017-01-13 DIAGNOSIS — Z720 Tobacco use: Secondary | ICD-10-CM | POA: Diagnosis not present

## 2017-01-13 DIAGNOSIS — F329 Major depressive disorder, single episode, unspecified: Secondary | ICD-10-CM

## 2017-01-13 DIAGNOSIS — F32A Depression, unspecified: Secondary | ICD-10-CM

## 2017-01-13 NOTE — Patient Instructions (Signed)
Nice to see you. I am glad things are going better. Please continue the Celexa. If your anxiety worsens please let us know.

## 2017-01-13 NOTE — Progress Notes (Signed)
  Eileen AlarEric Sonnenberg, MD Phone: [phone]  Eileen Hart is a 26 y.o. female who presents today for follow-up.  Anxiety/depression: Currently on Celexa 10 mg. Notes her anxiety is quite a bit better. Not a whole lot makes her anxious now. She left her significant other and notes this helped significantly with anxiety. She notes no depression. No SI or HI.  Tobacco abuse: Still not ready to quit. In the past she is quit on her own.   PMH: Smoker.   ROS see history of present illness  Objective  Physical Exam Vitals:   01/13/17 1404  BP: 102/60  Pulse: 69  Temp: 98.9 F (37.2 C)    BP Readings from Last 3 Encounters:  01/13/17 102/60  11/25/16 108/80  09/30/16 (!) 98/58   Wt Readings from Last 3 Encounters:  01/13/17 118 lb 3.2 oz (53.6 kg)  11/25/16 118 lb 6.4 oz (53.7 kg)  09/30/16 123 lb (55.8 kg)    Physical Exam  Constitutional: No distress.  Cardiovascular: Normal rate, regular rhythm and normal heart sounds.   Pulmonary/Chest: Effort normal and breath sounds normal.  Skin: She is not diaphoretic.     Assessment/Plan: Please see individual problem list.  Anxiety and depression Improved. Continue current dose of Celexa. If anxiety worsens she can call and we would consider going up on Celexa. Follow-up in 6 months.  Tobacco abuse Not quite ready to quit. In the past she has quit on her own. Discussed that she could try nicotine replacement.  Eileen AlarEric Sonnenberg, MD [facility] [place]

## 2017-01-13 NOTE — Assessment & Plan Note (Signed)
Not quite ready to quit. In the past she has quit on her own. Discussed that she could try nicotine replacement.

## 2017-01-13 NOTE — Assessment & Plan Note (Signed)
Improved. Continue current dose of Celexa. If anxiety worsens she can call and we would consider going up on Celexa. Follow-up in 6 months.

## 2017-04-17 ENCOUNTER — Ambulatory Visit (INDEPENDENT_AMBULATORY_CARE_PROVIDER_SITE_OTHER): Payer: BLUE CROSS/BLUE SHIELD | Admitting: Family Medicine

## 2017-04-17 ENCOUNTER — Encounter: Payer: Self-pay | Admitting: Family Medicine

## 2017-04-17 DIAGNOSIS — S46912A Strain of unspecified muscle, fascia and tendon at shoulder and upper arm level, left arm, initial encounter: Secondary | ICD-10-CM | POA: Diagnosis not present

## 2017-04-17 NOTE — Progress Notes (Signed)
  Marikay AlarEric Tige Meas, MD Phone: [phone]  Eileen CampHolly L Hart is a 26 y.o. female who presents today for day visit.  Patient notes about 10 days ago she was at work and barely moved her left shoulder forward and felt a pop and then a burning sensation. Notes the pain was not that bad until several days later when almost any movement of her left shoulder would cause discomfort. Particularly moving it forward and up. She rested it and held it in the same position for some time and then over the last day or so it has improved to where she has almost no pain.  ROS see history of present illness  Objective  Physical Exam Vitals:   04/17/17 0841  BP: 106/72  Pulse: 91  Temp: 99 F (37.2 C)  SpO2: 98%    BP Readings from Last 3 Encounters:  04/17/17 106/72  01/13/17 102/60  11/25/16 108/80   Wt Readings from Last 3 Encounters:  04/17/17 123 lb 9.6 oz (56.1 kg)  01/13/17 118 lb 3.2 oz (53.6 kg)  11/25/16 118 lb 6.4 oz (53.7 kg)    Physical Exam  Constitutional: No distress.  Cardiovascular: Normal rate, regular rhythm and normal heart sounds.   Pulmonary/Chest: Effort normal and breath sounds normal.  Musculoskeletal:  Bilateral shoulder symmetric no tenderness, full range of motion with no discomfort bilaterally  Neurological: She is alert.  Skin: She is not diaphoretic.     Assessment/Plan: Please see individual problem list.  Left shoulder strain Possible impingement syndrome versus other muscular strain. Has improved significantly. Will provide with exercises to help strengthen the shoulder. She'll monitor for worsening.  Marikay AlarEric Teirra Carapia, MD [facility] [place] [place]

## 2017-04-17 NOTE — Patient Instructions (Signed)
Shoulder Impingement Syndrome Rehab  Ask your health care provider which exercises are safe for you. Do exercises exactly as told by your health care provider and adjust them as directed. It is normal to feel mild stretching, pulling, tightness, or discomfort as you do these exercises, but you should stop right away if you feel sudden pain or your pain gets worse. Do not begin these exercises until told by your health care provider.  Stretching and range of motion exercise  This exercise warms up your muscles and joints and improves the movement and flexibility of your shoulder. This exercise also helps to relieve pain and stiffness.  Exercise A: Passive horizontal adduction    1. Sit or stand and pull your left / right elbow across your chest, toward your other shoulder. Stop when you feel a gentle stretch in the back of your shoulder and upper arm.  ? Keep your arm at shoulder height.  ? Keep your arm as close to your body as you comfortably can.  2. Hold for __________ seconds.  3. Slowly return to the starting position.  Repeat __________ times. Complete this exercise __________ times a day.  Strengthening exercises  These exercises build strength and endurance in your shoulder. Endurance is the ability to use your muscles for a long time, even after they get tired.  Exercise B: External rotation, isometric  1. Stand or sit in a doorway, facing the door frame.  2. Bend your left / right elbow and place the back of your wrist against the door frame. Only your wrist should be touching the frame. Keep your upper arm at your side.  3. Gently press your wrist against the door frame, as if you are trying to push your arm away from your abdomen.  ? Avoid shrugging your shoulder while you press your hand against the door frame. Keep your shoulder blade tucked down toward the middle of your back.  4. Hold for __________ seconds.  5. Slowly release the tension, and relax your muscles completely before you do the exercise  again.  Repeat __________ times. Complete this exercise __________ times a day.  Exercise C: Internal rotation, isometric    1. Stand or sit in a doorway, facing the door frame.  2. Bend your left / right elbow and place the inside of your wrist against the door frame. Only your wrist should be touching the frame. Keep your upper arm at your side.  3. Gently press your wrist against the door frame, as if you are trying to push your arm toward your abdomen.  ? Avoid shrugging your shoulder while you press your hand against the door frame. Keep your shoulder blade tucked down toward the middle of your back.  4. Hold for __________ seconds.  5. Slowly release the tension, and relax your muscles completely before you do the exercise again.  Repeat __________ times. Complete this exercise __________ times a day.  Exercise D: Scapular protraction, supine    1. Lie on your back on a firm surface. Hold a __________ weight in your left / right hand.  2. Raise your left / right arm straight into the air so your hand is directly above your shoulder joint.  3. Push the weight into the air so your shoulder lifts off of the surface that you are lying on. Do not move your head, neck, or back.  4. Hold for __________ seconds.  5. Slowly return to the starting position. Let your muscles relax completely before   you repeat this exercise.  Repeat __________ times. Complete this exercise __________ times a day.  Exercise E: Scapular retraction    1. Sit in a stable chair without armrests, or stand.  2. Secure an exercise band to a stable object in front of you so the band is at shoulder height.  3. Hold one end of the exercise band in each hand. Your palms should face down.  4. Squeeze your shoulder blades together and move your elbows slightly behind you. Do not shrug your shoulders while you do this.  5. Hold for __________ seconds.  6. Slowly return to the starting position.  Repeat __________ times. Complete this exercise __________  times a day.  Exercise F: Shoulder extension    1. Sit in a stable chair without armrests, or stand.  2. Secure an exercise band to a stable object in front of you where the band is above shoulder height.  3. Hold one end of the exercise band in each hand.  4. Straighten your elbows and lift your hands up to shoulder height.  5. Squeeze your shoulder blades together and pull your hands down to the sides of your thighs. Stop when your hands are straight down by your sides. Do not let your hands go behind your body.  6. Hold for __________ seconds.  7. Slowly return to the starting position.  Repeat __________ times. Complete this exercise __________ times a day.  This information is not intended to replace advice given to you by your health care provider. Make sure you discuss any questions you have with your health care provider.  Document Released: 06/10/2005 Document Revised: 02/15/2016 Document Reviewed: 05/13/2015  Elsevier Interactive Patient Education © 2018 [employer]

## 2017-04-17 NOTE — Assessment & Plan Note (Signed)
Possible impingement syndrome versus other muscular strain. Has improved significantly. Will provide with exercises to help strengthen the shoulder. She'll monitor for worsening.

## 2017-05-25 ENCOUNTER — Other Ambulatory Visit: Payer: Self-pay | Admitting: Family Medicine

## 2017-05-25 DIAGNOSIS — F32A Depression, unspecified: Secondary | ICD-10-CM

## 2017-05-25 DIAGNOSIS — F329 Major depressive disorder, single episode, unspecified: Secondary | ICD-10-CM

## 2017-05-25 DIAGNOSIS — F419 Anxiety disorder, unspecified: Principal | ICD-10-CM

## 2017-07-21 ENCOUNTER — Encounter: Payer: Self-pay | Admitting: Family Medicine

## 2017-07-21 ENCOUNTER — Other Ambulatory Visit: Payer: Self-pay

## 2017-07-21 ENCOUNTER — Ambulatory Visit: Payer: BLUE CROSS/BLUE SHIELD | Admitting: Family Medicine

## 2017-07-21 VITALS — BP 102/80 | HR 89 | Temp 98.3°F | Wt 126.1 lb

## 2017-07-21 DIAGNOSIS — Z720 Tobacco use: Secondary | ICD-10-CM

## 2017-07-21 DIAGNOSIS — F329 Major depressive disorder, single episode, unspecified: Secondary | ICD-10-CM

## 2017-07-21 DIAGNOSIS — F419 Anxiety disorder, unspecified: Secondary | ICD-10-CM | POA: Diagnosis not present

## 2017-07-21 DIAGNOSIS — F32A Depression, unspecified: Secondary | ICD-10-CM

## 2017-07-21 NOTE — Assessment & Plan Note (Signed)
Fairly well controlled.  She will continue Celexa.  She wants to hold on her current dose.  She is interested in seeing a therapist.  I think this would be beneficial.  Referral placed.

## 2017-07-21 NOTE — Assessment & Plan Note (Signed)
Encouraged smoking cessation.  She notes when she is ready she will quit.

## 2017-07-21 NOTE — Patient Instructions (Signed)
Nice to see you. We will see about getting you into see a therapist. If you would like to increase your Celexa dose please let us know.
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
## 2017-09-04 ENCOUNTER — Other Ambulatory Visit: Payer: Self-pay

## 2017-09-04 DIAGNOSIS — F32A Depression, unspecified: Secondary | ICD-10-CM

## 2017-09-04 DIAGNOSIS — F329 Major depressive disorder, single episode, unspecified: Secondary | ICD-10-CM

## 2017-09-04 DIAGNOSIS — F419 Anxiety disorder, unspecified: Principal | ICD-10-CM

## 2017-09-04 MED ORDER — CITALOPRAM HYDROBROMIDE 10 MG PO TABS: 10.0000 mg | ORAL_TABLET | Freq: Every day | ORAL | 1 refills | Status: DC

## 2017-10-14 ENCOUNTER — Other Ambulatory Visit: Payer: Self-pay | Admitting: Certified Nurse Midwife

## 2017-12-22 DIAGNOSIS — F411 Generalized anxiety disorder: Secondary | ICD-10-CM | POA: Diagnosis not present

## 2017-12-29 DIAGNOSIS — F411 Generalized anxiety disorder: Secondary | ICD-10-CM | POA: Diagnosis not present

## 2018-01-12 DIAGNOSIS — F411 Generalized anxiety disorder: Secondary | ICD-10-CM | POA: Diagnosis not present

## 2018-01-15 ENCOUNTER — Other Ambulatory Visit: Payer: Self-pay | Admitting: Certified Nurse Midwife

## 2018-01-19 ENCOUNTER — Encounter: Payer: Self-pay | Admitting: Family Medicine

## 2018-01-19 ENCOUNTER — Ambulatory Visit: Payer: BLUE CROSS/BLUE SHIELD | Admitting: Family Medicine

## 2018-01-19 DIAGNOSIS — F329 Major depressive disorder, single episode, unspecified: Secondary | ICD-10-CM | POA: Diagnosis not present

## 2018-01-19 DIAGNOSIS — Z720 Tobacco use: Secondary | ICD-10-CM | POA: Diagnosis not present

## 2018-01-19 DIAGNOSIS — F32A Depression, unspecified: Secondary | ICD-10-CM

## 2018-01-19 DIAGNOSIS — F419 Anxiety disorder, unspecified: Secondary | ICD-10-CM | POA: Diagnosis not present

## 2018-01-19 NOTE — Patient Instructions (Signed)
Nice to see you. We will continue your Celexa at this time.  Please continue to see the therapist as you are. When you are ready to quit smoking please quit.

## 2018-01-19 NOTE — Assessment & Plan Note (Signed)
Much improved.  We will continue Celexa and she will continue to see the therapist.  She will follow-up in 6 months.  At that time if things are going well we could consider tapering off of Celexa.

## 2018-01-19 NOTE — Assessment & Plan Note (Signed)
She has cut down.  I have encouraged her to quit when she is ready.

## 2018-01-19 NOTE — Progress Notes (Signed)
  Eileen AlarEric Cutberto Winfree, MD Phone: [phone]  Eileen Hart is a 27 y.o. female who presents today for f/u.  CC: anxiety, tobacco abuse  Anxiety: Eileen Hart notes overall this is quite a bit improved.  Eileen Hart is in a good place.  Eileen Hart started going to see a therapist which has been beneficial.  They are in the process of spacing her visits out.  Eileen Hart notes no depression.  Eileen Hart is on Celexa.  No SI.  Tobacco abuse: Eileen Hart is down to 1/4 pack/day.  Eileen Hart is in the process of buying a house and plans to quit as soon as that is completed.  Social History   Tobacco Use  Smoking Status Current Every Day Smoker  Smokeless Tobacco Never Used     ROS see history of present illness  Objective  Physical Exam Vitals:   01/19/18 1002  BP: 94/60  Pulse: 97  Temp: 98.2 F (36.8 C)  SpO2: 96%    BP Readings from Last 3 Encounters:  01/19/18 94/60  07/21/17 102/80  04/17/17 106/72   Wt Readings from Last 3 Encounters:  01/19/18 118 lb (53.5 kg)  07/21/17 126 lb 1.9 oz (57.2 kg)  04/17/17 123 lb 9.6 oz (56.1 kg)    Physical Exam  Constitutional: No distress.  Cardiovascular: Normal rate, regular rhythm and normal heart sounds.  Pulmonary/Chest: Effort normal and breath sounds normal.  Neurological: Eileen Hart is alert.  Skin: Skin is warm and dry. Eileen Hart is not diaphoretic.     Assessment/Plan: Please see individual problem list.  Anxiety and depression Much improved.  We will continue Celexa and Eileen Hart will continue to see the therapist.  Eileen Hart will follow-up in 6 months.  At that time if things are going well we could consider tapering off of Celexa.  Tobacco abuse Eileen Hart has cut down.  I have encouraged her to quit when Eileen Hart is ready.   No orders of the defined types were placed in this encounter.   No orders of the defined types were placed in this encounter.    Eileen AlarEric Traveion Ruddock, MD [facility] [place] [facility]

## 2018-02-09 DIAGNOSIS — H10433 Chronic follicular conjunctivitis, bilateral: Secondary | ICD-10-CM | POA: Diagnosis not present

## 2018-03-16 ENCOUNTER — Other Ambulatory Visit: Payer: Self-pay | Admitting: Certified Nurse Midwife

## 2018-03-17 ENCOUNTER — Telehealth: Payer: Self-pay | Admitting: Certified Nurse Midwife

## 2018-03-17 NOTE — Telephone Encounter (Signed)
Patient is schedule 05/04/18 with CLG

## 2018-03-17 NOTE — Telephone Encounter (Signed)
-----   Message from Farrel Connersolleen Gutierrez, [place] sent at 03/17/2018 11:43 AM EDT ----- Regarding: appointment Please call client and schedule an appointment for her annual gyn exam.

## 2018-03-23 DIAGNOSIS — N390 Urinary tract infection, site not specified: Secondary | ICD-10-CM | POA: Diagnosis not present

## 2018-03-24 ENCOUNTER — Telehealth: Payer: Self-pay | Admitting: Family Medicine

## 2018-03-24 ENCOUNTER — Ambulatory Visit: Payer: BLUE CROSS/BLUE SHIELD | Admitting: Family Medicine

## 2018-03-24 ENCOUNTER — Encounter: Payer: Self-pay | Admitting: Family Medicine

## 2018-03-24 VITALS — BP 112/58 | HR 102 | Temp 100.2°F | Ht 64.0 in | Wt 115.6 lb

## 2018-03-24 DIAGNOSIS — R309 Painful micturition, unspecified: Secondary | ICD-10-CM

## 2018-03-24 DIAGNOSIS — N3000 Acute cystitis without hematuria: Secondary | ICD-10-CM

## 2018-03-24 DIAGNOSIS — R109 Unspecified abdominal pain: Secondary | ICD-10-CM | POA: Diagnosis not present

## 2018-03-24 DIAGNOSIS — R110 Nausea: Secondary | ICD-10-CM | POA: Diagnosis not present

## 2018-03-24 LAB — POCT URINALYSIS DIPSTICK
Glucose, UA: NEGATIVE
KETONES UA: 80
NITRITE UA: POSITIVE
PROTEIN UA: POSITIVE — AB
Spec Grav, UA: 1.02 (ref 1.010–1.025)
Urobilinogen, UA: 1 E.U./dL
pH, UA: 6 (ref 5.0–8.0)

## 2018-03-24 LAB — POCT URINE PREGNANCY: PREG TEST UR: NEGATIVE

## 2018-03-24 MED ORDER — CEFTRIAXONE SODIUM 1 G IJ SOLR
1.0000 g | Freq: Once | INTRAMUSCULAR | Status: AC
  Administered 2018-03-24: 1 g via INTRAMUSCULAR

## 2018-03-24 MED ORDER — ONDANSETRON 4 MG PO TBDP: 4.0000 mg | ORAL_TABLET | Freq: Three times a day (TID) | ORAL | 0 refills | Status: DC | PRN

## 2018-03-24 NOTE — Patient Instructions (Signed)
Finish bactrim prescribed by the urgent care. 1gram of IM rocephin given in clinic today  Tylenol or motrin as needed for fever  Increase fluids, rest, do good handwashing   Urinary Tract Infection, Adult A urinary tract infection (UTI) is an infection of any part of the urinary tract, which includes the kidneys, ureters, bladder, and urethra. These organs make, store, and get rid of urine in the body. UTI can be a bladder infection (cystitis) or kidney infection (pyelonephritis). What are the causes? This infection may be caused by fungi, viruses, or bacteria. Bacteria are the most common cause of UTIs. This condition can also be caused by repeated incomplete emptying of the bladder during urination. What increases the risk? This condition is more likely to develop if:  You ignore your need to urinate or hold urine for long periods of time.  You do not empty your bladder completely during urination.  You wipe back to front after urinating or having a bowel movement, if you are female.  You are uncircumcised, if you are female.  You are constipated.  You have a urinary catheter that stays in place (indwelling).  You have a weak defense (immune) system.  You have a medical condition that affects your bowels, kidneys, or bladder.  You have diabetes.  You take antibiotic medicines frequently or for long periods of time, and the antibiotics no longer work well against certain types of infections (antibiotic resistance).  You take medicines that irritate your urinary tract.  You are exposed to chemicals that irritate your urinary tract.  You are female.  What are the signs or symptoms? Symptoms of this condition include:  Fever.  Frequent urination or passing small amounts of urine frequently.  Needing to urinate urgently.  Pain or burning with urination.  Urine that smells bad or unusual.  Cloudy urine.  Pain in the lower abdomen or back.  Trouble  urinating.  Blood in the urine.  Vomiting or being less hungry than normal.  Diarrhea or abdominal pain.  Vaginal discharge, if you are female.  How is this diagnosed? This condition is diagnosed with a medical history and physical exam. You will also need to provide a urine sample to test your urine. Other tests may be done, including:  Blood tests.  Sexually transmitted disease (STD) testing.  If you have had more than one UTI, a cystoscopy or imaging studies may be done to determine the cause of the infections. How is this treated? Treatment for this condition often includes a combination of two or more of the following:  Antibiotic medicine.  Other medicines to treat less common causes of UTI.  Over-the-counter medicines to treat pain.  Drinking enough water to stay hydrated.  Follow these instructions at home:  Take over-the-counter and prescription medicines only as told by your health care provider.  If you were prescribed an antibiotic, take it as told by your health care provider. Do not stop taking the antibiotic even if you start to feel better.  Avoid alcohol, caffeine, tea, and carbonated beverages. They can irritate your bladder.  Drink enough fluid to keep your urine clear or pale yellow.  Keep all follow-up visits as told by your health care provider. This is important.  Make sure to: ? Empty your bladder often and completely. Do not hold urine for long periods of time. ? Empty your bladder before and after sex. ? Wipe from front to back after a bowel movement if you are female. Use each tissue  one time when you wipe. Contact a health care provider if:  You have back pain.  You have a fever.  You feel nauseous or vomit.  Your symptoms do not get better after 3 days.  Your symptoms go away and then return. Get help right away if:  You have severe back pain or lower abdominal pain.  You are vomiting and cannot keep down any medicines or  water. This information is not intended to replace advice given to you by your health care provider. Make sure you discuss any questions you have with your health care provider. Document Released: 03/20/2005 Document Revised: 11/22/2015 Document Reviewed: 05/01/2015 Elsevier Interactive Patient Education  [employer].

## 2018-03-24 NOTE — Telephone Encounter (Signed)
Patient called with C/O increased pain in right side and in her back rated at 7 on scale of 0-10, seen on 03/23/18 at [facility] was advised she has UTI given ABX she thinks it was Bactrim given at [facility].  Ask patient if she has ever had kidney stone she said no , but has family HX in mother. Scheduled for 430 fyi.

## 2018-03-24 NOTE — Progress Notes (Signed)
Subjective:    Patient ID: Eileen Hart, female    DOB: 1990/11/30, 27 y.o.   MRN: [id number]  HPI  Patient presents to clinic complaining of right flank pain for past few days.  Recently was seen at [facility], diagnosed with UTI and has been started on Bactrim course.  Patient has taken Bactrim for a total of 3 doses, Rx is for BID x7 days.  Patient also reports feelings of nausea, but nausea seems better since the day has progressed.  Denies any personal history of kidney stones, but states mother has had them before.  Patient Active Problem List   Diagnosis Date Noted  . Tobacco abuse 05/02/2016  . Anxiety and depression 12/29/2015   Social History   Tobacco Use  . Smoking status: Current Every Day Smoker  . Smokeless tobacco: Never Used  Substance Use Topics  . Alcohol use: No    Alcohol/week: 0.0 standard drinks   Review of Systems  Constitutional: Negative for chills, fatigue and fever.  HENT: Negative for congestion, ear pain, sinus pain and sore throat.   Eyes: Negative.   Respiratory: Negative for cough, shortness of breath and wheezing.   Cardiovascular: Negative for chest pain, palpitations and leg swelling.  Gastrointestinal: Negative for abdominal pain, diarrhea, nausea and vomiting.  Genitourinary: Positive for dysuria, frequency and urgency & right flank pain.   Musculoskeletal: Negative for arthralgias and myalgias.  Skin: Negative for color change, pallor and rash.  Neurological: Negative for syncope, light-headedness and headaches.  Psychiatric/Behavioral: The patient is not nervous/anxious.       Objective:   Physical Exam  Constitutional: She appears well-developed and well-nourished. No distress.  Head: Normocephalic and atraumatic.  Eyes: EOM are normal. No scleral icterus.  Neck: Normal range of motion. Neck supple. No tracheal deviation present.  Cardiovascular: Normal rate, regular rhythm and normal heart sounds.  Pulmonary/Chest:  Effort normal and breath sounds normal. No respiratory distress. She has no wheezes. She has no rales.  Abdominal: Soft. Bowel sounds are normal. Mild R flank pain, mild suprapubic tenderness.  Neurological: She is alert and oriented to person, place, and time.  Gait normal  Skin: Skin is warm and dry. No pallor.  Psychiatric: She has a normal mood and affect. Her behavior is normal. Thought content normal.    Nursing note and vitals reviewed.   Vitals:   03/24/18 1647  BP: (!) 112/58  Pulse: (!) 102  Temp: 100.2 F (37.9 C)  SpO2: 94%   Urine pregnancy negative    Assessment & Plan:    UTI, Nausea, R flank pain -- urine is negative for any blood, so I do not suspect kidney stone.  I feel dysuria/fever/nausea symptoms are all related to UTI.  Patient will finish Bactrim course as prescribed by urgent care.  We will also give her 1 g of IM Rocephin in clinic today due to her temperature of 100.2.  Patient advised to use Tylenol and/or Motrin as needed for fever.  Patient also prescribed Zofran ODT to use as needed for any nausea.  Had discussion about eating bland foods, clear liquids for the next few days such as chicken noodle soup, white rice, ginger ale, then may advance diet slowly as tolerated. Increase water intake, get good rest.  Administrations This Visit    cefTRIAXone (ROCEPHIN) injection 1 g    Admin Date 03/24/2018 Action Given Dose 1 g Route Intramuscular Administered By Clearnce Sorrel, RMA  Keep regular follow-up as already planned.  Return to clinic sooner if issues arise.

## 2018-03-25 LAB — URINALYSIS, MICROSCOPIC ONLY: RBC / HPF: NONE SEEN (ref 0–?)

## 2018-04-12 ENCOUNTER — Other Ambulatory Visit: Payer: Self-pay | Admitting: Certified Nurse Midwife

## 2018-05-03 NOTE — Progress Notes (Signed)
Gynecology Annual Exam       PCP: Glori Luis, MD  Chief Complaint:  Chief Complaint  Patient presents with  . Gynecologic Exam    History of Present Illness Eileen Hart is a 27 year old Caucasian/White female, G2 P1011, who presents for her annual exam. She is having no significant GYN problems.   Her menses have been regular on the pill.  They come every month, last 5-6 days and the flow is lite to medium. Her LMP is 04/12/2018  She has had no spotting.  She denies dysmenorrhea.  The patient's past medical history is notable for a history of breast augmentation surgery in 2015. Her last annual exam was in 09/30/2016 at which time her Pap smear was NIL.   She is sexually active. She is currently using OCPs for contraception.   Mammogram is not applicable.  There is a positive history of breast cancer in her maternal second cousin. Genetic testing has not been done.  There is no family history of ovarian cancer.  The patient does do monthly self breast exams.  The patient smokes 1/2 PPD. Has been thinking about quitting.  The patient does not drink alcohol.  The patient does not use illegal drugs.  The patient does not exercise but leads an active life.  The patient does not get adequate calcium in her diet.  She has not had a recent cholesterol screen and is not interested in labwork.  She has weaned down to 10 mgm Celexa daily. Has been seeing a therapist   Review of Systems: Review of Systems  Constitutional: Negative for chills, fever and weight loss.  HENT: Negative for congestion, sinus pain and sore throat.   Eyes: Negative for blurred vision and pain.  Respiratory: Negative for hemoptysis, shortness of breath and wheezing.   Cardiovascular: Negative for chest pain, palpitations and leg swelling.  Gastrointestinal: Negative for abdominal pain, blood in stool, diarrhea, heartburn, nausea and vomiting.  Genitourinary: Negative for dysuria, frequency,  hematuria and urgency.  Musculoskeletal: Negative for back pain, joint pain and myalgias.  Skin: Negative for itching and rash.  Neurological: Negative for dizziness, tingling and headaches.  Endo/Heme/Allergies: Negative for environmental allergies and polydipsia. Does not bruise/bleed easily.       Negative for hirsutism   Psychiatric/Behavioral: Negative for depression. The patient is nervous/anxious. The patient does not have insomnia.      Past Medical History:  Past Medical History:  Diagnosis Date  . Chlamydia 01/25/2010  . Depression with anxiety   . Gestational hypertension   . Immunization, viral disease    gardasil series completed    Past Surgical History:  Past Surgical History:  Procedure Laterality Date  . BREAST ENHANCEMENT SURGERY  2015  . TONSILLECTOMY      Medications:  Current Outpatient Medications:  .  citalopram (CELEXA) 10 MG tablet, Take 1 tablet (10 mg total) by mouth daily., Disp: 90 tablet, Rfl: 1 .  norgestimate-ethinyl estradiol (SPRINTEC 28) 0.25-35 MG-MCG tablet, Take 1 tablet by mouth daily., Disp: 84 tablet, Rfl: 3 Multivitamin Vitamin C  Allergies:  No Known Allergies  Gynecologic History: Patient's last menstrual period was 04/12/2018 (exact date). Treated for Chlamydia in 2011. Has completed her Gardasil series.  Obstetric History: G2P1011 Hx of LTCS for FTP/ gestational hypertension  Social History:  Social History   Socioeconomic History  . Marital status: Single    Spouse name: Not on file  . Number of children: 1  . Years of  education: 15  . Highest education level: Not on file  Occupational History  . Occupation: Groomer    Comment: [employer]  Social Needs  . Financial resource strain: Not on file  . Food insecurity:    Worry: Not on file    Inability: Not on file  . Transportation needs:    Medical: Not on file    Non-medical: Not on file  Tobacco Use  . Smoking status: Current Every Day Smoker     Packs/day: 0.50    Types: Cigarettes  . Smokeless tobacco: Never Used  Substance and Sexual Activity  . Alcohol use: No    Alcohol/week: 0.0 standard drinks  . Drug use: No  . Sexual activity: Yes    Birth control/protection: Condom, Pill  Lifestyle  . Physical activity:    Days per week: Not on file    Minutes per session: Not on file  . Stress: Not on file  Relationships  . Social connections:    Talks on phone: Not on file    Gets together: Not on file    Attends religious service: Not on file    Active member of club or organization: Not on file    Attends meetings of clubs or organizations: Not on file    Relationship status: Not on file  . Intimate partner violence:    Fear of current or ex partner: Not on file    Emotionally abused: Not on file    Physically abused: Not on file    Forced sexual activity: Not on file  Other Topics Concern  . Not on file  Social History Narrative  . Not on file    Family History:  Family History  Problem Relation Age of Onset  . Breast cancer Other 35       mat 2nd cousin  . Diabetes Neg Hx   . Heart disease Neg Hx   . Hypertension Neg Hx   . Ovarian cancer Neg Hx   . Colon cancer Neg Hx      Physical Exam Vitals:  BP 90/60   Pulse 72   Ht 5\' 4"  (1.626 m)   Wt 117 lb (53.1 kg)   LMP 04/12/2018 (Exact Date)   BMI 20.08 kg/m  Physical Exam  Constitutional: She is oriented to person, place, and time. She appears well-developed and well-nourished.  HENT:  Head: Normocephalic and atraumatic.  Neck: Normal range of motion. Neck supple. No thyromegaly present.  Cardiovascular: Normal rate and regular rhythm.  No murmur heard. Respiratory: Effort normal and breath sounds normal. She has no wheezes. She has no rales. She exhibits no tenderness. Right breast exhibits no inverted nipple, no mass, no nipple discharge, no skin change and no tenderness. Left breast exhibits no inverted nipple, no mass, no nipple discharge, no skin  change and no tenderness.  s/p breast augmentation. No infraclavicular or supraclavicular adenopathy  GI: Soft. She exhibits no distension and no mass. There is no tenderness. There is no rebound and no guarding.  Genitourinary: Rectum normal.  Genitourinary Comments: Vulva: no lesions or inflammation Vagina: clear discharge, no lesions Cervix: nullip, NT, no lesions Uterus: nonenlarged, normal contour Position: anteverted  mobile, non-tender Adnexa: no masses, NT   Musculoskeletal: Normal range of motion.  Lymphadenopathy:    She has no cervical adenopathy.    She has no axillary adenopathy.       Right: No inguinal adenopathy present.       Left: No inguinal adenopathy present.  Neurological: She is alert and oriented to person, place, and time.  Skin: Skin is warm and dry. No rash noted.  Psychiatric: She has a normal mood and affect. Her behavior is normal. Judgment and thought content normal.     Assessment: 27 y.o. G2P1011 well woman exam   Plan:  1) Contraception : Rx for Sprintec 84 tabs with RF x3 .  2)Pap and  STI screening was done.  3) Encouraged smoking cessation. "Not ready to quit."  4) Discussed recommended calcium and vitamin d3 requirements. May need to take supplement.  5) Follow up 1 year for routine annual exam    Farrel Conners, CNM  05/04/2018 5:12 PM

## 2018-05-04 ENCOUNTER — Ambulatory Visit (INDEPENDENT_AMBULATORY_CARE_PROVIDER_SITE_OTHER): Payer: BLUE CROSS/BLUE SHIELD | Admitting: Certified Nurse Midwife

## 2018-05-04 ENCOUNTER — Other Ambulatory Visit (HOSPITAL_COMMUNITY)
Admission: RE | Admit: 2018-05-04 | Discharge: 2018-05-04 | Disposition: A | Discharge status: Home / Self Care | Payer: BLUE CROSS/BLUE SHIELD | Source: Ambulatory Visit | Attending: Certified Nurse Midwife | Admitting: Certified Nurse Midwife

## 2018-05-04 ENCOUNTER — Encounter: Payer: Self-pay | Admitting: Certified Nurse Midwife

## 2018-05-04 VITALS — BP 90/60 | HR 72 | Ht 64.0 in | Wt 117.0 lb

## 2018-05-04 DIAGNOSIS — Z124 Encounter for screening for malignant neoplasm of cervix: Secondary | ICD-10-CM | POA: Insufficient documentation

## 2018-05-04 DIAGNOSIS — Z01419 Encounter for gynecological examination (general) (routine) without abnormal findings: Secondary | ICD-10-CM

## 2018-05-04 DIAGNOSIS — Z113 Encounter for screening for infections with a predominantly sexual mode of transmission: Secondary | ICD-10-CM | POA: Insufficient documentation

## 2018-05-04 DIAGNOSIS — Z3041 Encounter for surveillance of contraceptive pills: Secondary | ICD-10-CM

## 2018-05-04 MED ORDER — NORGESTIMATE-ETH ESTRADIOL 0.25-35 MG-MCG PO TABS: 1.0000 | ORAL_TABLET | Freq: Every day | ORAL | 3 refills | Status: DC

## 2018-05-06 LAB — CYTOLOGY - PAP
CHLAMYDIA, DNA PROBE: NEGATIVE
Diagnosis: NEGATIVE
Neisseria Gonorrhea: NEGATIVE

## 2018-07-27 ENCOUNTER — Ambulatory Visit: Payer: BLUE CROSS/BLUE SHIELD | Admitting: Family Medicine

## 2018-07-27 ENCOUNTER — Encounter: Payer: Self-pay | Admitting: Family Medicine

## 2018-07-27 VITALS — BP 102/74 | HR 90 | Temp 98.4°F | Ht 64.0 in | Wt 118.0 lb

## 2018-07-27 DIAGNOSIS — F419 Anxiety disorder, unspecified: Secondary | ICD-10-CM

## 2018-07-27 DIAGNOSIS — F32A Depression, unspecified: Secondary | ICD-10-CM

## 2018-07-27 DIAGNOSIS — F329 Major depressive disorder, single episode, unspecified: Secondary | ICD-10-CM | POA: Diagnosis not present

## 2018-07-27 DIAGNOSIS — Z720 Tobacco use: Secondary | ICD-10-CM | POA: Diagnosis not present

## 2018-07-27 MED ORDER — CITALOPRAM HYDROBROMIDE 20 MG PO TABS: ORAL_TABLET | ORAL | 1 refills | Status: DC

## 2018-07-27 NOTE — Assessment & Plan Note (Signed)
Depression is adequately controlled.  Anxiety is still there.  She does note the Celexa was beneficial though seem to have become less beneficial towards the end of her taking it.  We will restart Celexa at 10 mg daily and have her increase to 20 mg after 2 weeks.  She will follow-up in 3 months.

## 2018-07-27 NOTE — Progress Notes (Signed)
  Marikay Alar, MD Phone: [phone]  Eileen Hart is a 28 y.o. female who presents today for follow-up.  CC: Anxiety/depression, tobacco abuse  Anxiety/depression: Patient notes she has been out of the Celexa for about 3 months.  She notes no depression as she did go to therapy and that helped quite a bit with that.  She still has some anxiety.  She notes that surrounds pretty much everything.  Her son in particular will make her anxious if there is anything going on with him.  She notes work makes her anxious.  She tried Zoloft for postpartum depression and notes that made her feel nauseous and feel like a zombie.  No SI.  Tobacco abuse: She is getting closer to being ready to quit smoking.  She takes less breaks at work.  She smokes between a quarter and half a pack per day.  Social History   Tobacco Use  Smoking Status Current Every Day Smoker  . Packs/day: 0.50  . Types: Cigarettes  Smokeless Tobacco Never Used     ROS see history of present illness  Objective  Physical Exam Vitals:   07/27/18 0913  BP: 102/74  Pulse: 90  Temp: 98.4 F (36.9 C)  SpO2: 99%    BP Readings from Last 3 Encounters:  07/27/18 102/74  05/04/18 90/60  06/28/15 108/64   Wt Readings from Last 3 Encounters:  07/27/18 118 lb (53.5 kg)  05/04/18 117 lb (53.1 kg)  06/28/15 110 lb (49.9 kg)    Physical Exam Constitutional:      General: She is not in acute distress.    Appearance: She is not diaphoretic.  Cardiovascular:     Rate and Rhythm: Normal rate and regular rhythm.     Heart sounds: Normal heart sounds.  Pulmonary:     Effort: Pulmonary effort is normal.     Breath sounds: Normal breath sounds.  Skin:    General: Skin is warm and dry.  Neurological:     Mental Status: She is alert.      Assessment/Plan: Please see individual problem list.  Anxiety and depression Depression is adequately controlled.  Anxiety is still there.  She does note the Celexa was  beneficial though seem to have become less beneficial towards the end of her taking it.  We will restart Celexa at 10 mg daily and have her increase to 20 mg after 2 weeks.  She will follow-up in 3 months.  Tobacco abuse She has cut down.  She is getting closer to being ready to quit smoking.  She will continue to attempt to quit.   Health Maintenance: Patient will have lipid panel and CMP at next visit.  Discussed doing this today though she had to leave to take her son to the dentist.  No orders of the defined types were placed in this encounter.   Meds ordered this encounter  Medications  . citalopram (CELEXA) 20 MG tablet    Sig: Take 10 mg (half a tablet) by mouth daily for 2 weeks, then take 20 mg (1 tablet) by mouth daily    Dispense:  90 tablet    Refill:  1     Marikay Alar, MD [facility]

## 2018-07-27 NOTE — Patient Instructions (Signed)
Nice to see you. Please start on the Celexa as prescribed.  If you notice any side effects from this please let us know. We will get some screening labs today and contact you with the results.

## 2018-07-27 NOTE — Assessment & Plan Note (Signed)
She has cut down.  She is getting closer to being ready to quit smoking.  She will continue to attempt to quit.

## 2018-11-02 ENCOUNTER — Ambulatory Visit: Payer: BLUE CROSS/BLUE SHIELD | Admitting: Family Medicine

## 2018-11-30 DIAGNOSIS — H5213 Myopia, bilateral: Secondary | ICD-10-CM | POA: Diagnosis not present

## 2019-05-26 ENCOUNTER — Other Ambulatory Visit: Payer: Self-pay

## 2019-05-26 MED ORDER — NORGESTIMATE-ETH ESTRADIOL 0.25-35 MG-MCG PO TABS: 1.0000 | ORAL_TABLET | Freq: Every day | ORAL | 0 refills | Status: DC

## 2019-05-26 NOTE — Telephone Encounter (Signed)
Patient has scheduled AE for 06/2019. Requesting refill of OCP to get her to apt. Cb#[phone]

## 2019-05-26 NOTE — Telephone Encounter (Signed)
Spoke w/patient to verify which pharmacy as we have 3 on file. Refill sent to [facility] [address] per patient request.

## 2019-06-14 ENCOUNTER — Other Ambulatory Visit: Payer: Self-pay | Admitting: Certified Nurse Midwife

## 2019-07-06 ENCOUNTER — Other Ambulatory Visit: Payer: Self-pay

## 2019-07-06 ENCOUNTER — Encounter: Payer: Self-pay | Admitting: Certified Nurse Midwife

## 2019-07-06 ENCOUNTER — Ambulatory Visit (INDEPENDENT_AMBULATORY_CARE_PROVIDER_SITE_OTHER): Payer: BC Managed Care – PPO | Admitting: Certified Nurse Midwife

## 2019-07-06 VITALS — BP 120/90 | HR 72 | Ht 64.0 in | Wt 114.0 lb

## 2019-07-06 DIAGNOSIS — Z1239 Encounter for other screening for malignant neoplasm of breast: Secondary | ICD-10-CM

## 2019-07-06 DIAGNOSIS — Z01419 Encounter for gynecological examination (general) (routine) without abnormal findings: Secondary | ICD-10-CM

## 2019-07-06 DIAGNOSIS — Z3041 Encounter for surveillance of contraceptive pills: Secondary | ICD-10-CM

## 2019-07-06 MED ORDER — NORGESTIMATE-ETH ESTRADIOL 0.25-35 MG-MCG PO TABS: 1.0000 | ORAL_TABLET | Freq: Every day | ORAL | 3 refills | Status: DC

## 2019-07-07 ENCOUNTER — Encounter: Payer: Self-pay | Admitting: Certified Nurse Midwife

## 2019-07-07 DIAGNOSIS — Z309 Encounter for contraceptive management, unspecified: Secondary | ICD-10-CM | POA: Insufficient documentation

## 2019-07-07 NOTE — Progress Notes (Signed)
Gynecology Annual Exam       PCP: Glori Luis, MD  Chief Complaint:  Chief Complaint  Patient presents with  . Gynecologic Exam    History of Present Illness Eileen Hart is a 29 year old Caucasian/White female, G2 P1011, who presents for her annual exam. She is having no significant GYN problems.   Her menses have been regular on the pill.  They come every month, last 4-6 days and the flow is lite to medium. Her LMP is 07/01/2018 She has had no intermenstrual spotting.  She denies dysmenorrhea.  The patient's past medical history is notable for a history of breast augmentation surgery in 2015 and anxiety. She is no longer taking Celexa. . Her last annual exam was in 05/04/2018 at which time her Pap smear was NIL.   She is sexually active. She is currently using OCPs for contraception.   Mammogram is not applicable.  There is a positive history of breast cancer in her maternal second cousin. Genetic testing has not been done.  There is no family history of ovarian cancer.  The patient does do monthly self breast exams.  The patient has quit smoking in the last year! (Feb 2020 The patient does not drink alcohol.  The patient does not use illegal drugs.  The patient does not exercise but leads an active life.  The patient may get adequate calcium in her diet.  She has not had a recent cholesterol screen and is interested in labwork.  She has weaned down to 10 mgm Celexa daily. Has been seeing a therapist   Review of Systems: Review of Systems  Constitutional: Positive for malaise/fatigue. Negative for chills, fever and weight loss.  HENT: Negative for congestion, sinus pain and sore throat.   Eyes: Negative for blurred vision and pain.  Respiratory: Negative for hemoptysis, shortness of breath and wheezing.   Cardiovascular: Negative for chest pain, palpitations and leg swelling.  Gastrointestinal: Negative for abdominal pain, blood in stool, diarrhea, heartburn,  nausea and vomiting.  Genitourinary: Negative for dysuria, frequency, hematuria and urgency.  Musculoskeletal: Negative for back pain, joint pain and myalgias.  Skin: Negative for itching and rash.  Neurological: Negative for dizziness, tingling and headaches.  Endo/Heme/Allergies: Negative for environmental allergies and polydipsia. Does not bruise/bleed easily.       Negative for hirsutism   Psychiatric/Behavioral: Negative for depression. The patient is nervous/anxious. The patient does not have insomnia.      Past Medical History:  Past Medical History:  Diagnosis Date  . Chlamydia 01/25/2010  . Depression with anxiety   . Gestational hypertension   . Immunization, viral disease    gardasil series completed    Past Surgical History:  Past Surgical History:  Procedure Laterality Date  . BREAST ENHANCEMENT SURGERY  2015  . CESAREAN SECTION  2014  . TONSILLECTOMY      Medications:  Current Outpatient Medications:  .  norgestimate-ethinyl estradiol (SPRINTEC 28) 0.25-35 MG-MCG tablet, Take 1 tablet by mouth daily., Disp: 84 tablet, Rfl: 3  Vitamin B12 Iron Multivitamin  Allergies:  No Known Allergies  Gynecologic History: Patient's last menstrual period was 07/02/2019 (exact date). Treated for Chlamydia in 2011. Has completed her Gardasil series.  Obstetric History: G2P1011 Hx of LTCS for FTP/ gestational hypertension  Social History:  Social History   Socioeconomic History  . Marital status: Single    Spouse name: Not on file  . Number of children: 1  . Years of education: 74  .  Highest education level: Not on file  Occupational History  . Occupation: Groomer    Comment: [employer]  Tobacco Use  . Smoking status: Former Smoker    Packs/day: 0.50    Types: Cigarettes    Quit date: 07/25/2018    Years since quitting: 0.9  . Smokeless tobacco: Never Used  . Tobacco comment: quit 07/2018  Substance and Sexual Activity  . Alcohol use: No     Alcohol/week: 0.0 standard drinks  . Drug use: No  . Sexual activity: Yes    Birth control/protection: Pill  Other Topics Concern  . Not on file  Social History Narrative  . Not on file   Social Determinants of Health   Financial Resource Strain:   . Difficulty of Paying Living Expenses: Not on file  Food Insecurity:   . Worried About Charity fundraiser in the Last Year: Not on file  . Ran Out of Food in the Last Year: Not on file  Transportation Needs:   . Lack of Transportation (Medical): Not on file  . Lack of Transportation (Non-Medical): Not on file  Physical Activity:   . Days of Exercise per Week: Not on file  . Minutes of Exercise per Session: Not on file  Stress:   . Feeling of Stress : Not on file  Social Connections:   . Frequency of Communication with Friends and Family: Not on file  . Frequency of Social Gatherings with Friends and Family: Not on file  . Attends Religious Services: Not on file  . Active Member of Clubs or Organizations: Not on file  . Attends Archivist Meetings: Not on file  . Marital Status: Not on file  Intimate Partner Violence:   . Fear of Current or Ex-Partner: Not on file  . Emotionally Abused: Not on file  . Physically Abused: Not on file  . Sexually Abused: Not on file    Family History:  Family History  Problem Relation Age of Onset  . Breast cancer Other 35       mat 2nd cousin  . Diabetes Neg Hx   . Heart disease Neg Hx   . Hypertension Neg Hx   . Ovarian cancer Neg Hx   . Colon cancer Neg Hx      Physical Exam Vitals:  BP 120/90   Pulse 72   Ht 5\' 4"  (1.626 m)   Wt 51.7 kg   LMP 07/02/2019 (Exact Date)   BMI 19.57 kg/m   Repeat BP 125/92 Physical Exam  Constitutional: She is oriented to person, place, and time. She appears well-developed and well-nourished.  HENT:  Head: Normocephalic and atraumatic.  Neck: No thyromegaly present.  Cardiovascular: Normal rate and regular rhythm.  No murmur  heard. Respiratory: Effort normal and breath sounds normal. She has no wheezes. She has no rales. She exhibits no tenderness. Right breast exhibits no inverted nipple, no mass, no nipple discharge, no skin change and no tenderness. Left breast exhibits no inverted nipple, no mass, no nipple discharge, no skin change and no tenderness.  s/p breast augmentation. No infraclavicular or supraclavicular adenopathy  GI: Soft. She exhibits no distension and no mass. There is no abdominal tenderness. There is no rebound and no guarding.  Genitourinary:    Rectum normal.     Genitourinary Comments: Vulva: no lesions or inflammation Vagina: small amount of blood (on menses), no lesions Cervix: nullip, NT, no lesions Uterus: nonenlarged, normal contour Position: anteverted  mobile, non-tender Adnexa: no  masses, NT    Musculoskeletal:        General: Normal range of motion.     Cervical back: Normal range of motion and neck supple.  Lymphadenopathy:    She has no cervical adenopathy.    She has no axillary adenopathy.  Neurological: She is alert and oriented to person, place, and time.  Skin: Skin is warm and dry. No rash noted.  Psychiatric: She has a normal mood and affect. Her behavior is normal. Judgment and thought content normal.     Assessment: 29 y.o. G2P1011 well woman exam Mild blood pressure elevation  Plan:  1) Contraception : Rx for Sprintec 84 tabs with RF x3 .  2)Pap was not done. Pap smears to be done every 3 years. Next due in 2 years.  3) Congratulated for stopping smoking!  4) Follow up 1 year for routine annual exam    Farrel Conners, CNM

## 2019-07-21 ENCOUNTER — Telehealth: Payer: Self-pay | Admitting: Family Medicine

## 2019-07-21 NOTE — Telephone Encounter (Signed)
Pt needs a refill on Celexa sent to [facility] [address]

## 2019-07-22 ENCOUNTER — Other Ambulatory Visit: Payer: Self-pay

## 2019-07-22 NOTE — Telephone Encounter (Signed)
Patient called for a refill on Celexa for anxiety and I informed her that she needed to see the provider before she could have any refills, she scehduled a virtual visit with the provider on 07/27/19.  Shannen Flansburg,cma

## 2019-07-27 ENCOUNTER — Other Ambulatory Visit: Payer: Self-pay

## 2019-07-27 ENCOUNTER — Ambulatory Visit (INDEPENDENT_AMBULATORY_CARE_PROVIDER_SITE_OTHER): Payer: BC Managed Care – PPO | Admitting: Family Medicine

## 2019-07-27 ENCOUNTER — Encounter: Payer: Self-pay | Admitting: Family Medicine

## 2019-07-27 VITALS — Ht 64.0 in | Wt 110.0 lb

## 2019-07-27 DIAGNOSIS — F419 Anxiety disorder, unspecified: Secondary | ICD-10-CM | POA: Diagnosis not present

## 2019-07-27 DIAGNOSIS — Z13220 Encounter for screening for lipoid disorders: Secondary | ICD-10-CM | POA: Diagnosis not present

## 2019-07-27 DIAGNOSIS — F32A Depression, unspecified: Secondary | ICD-10-CM

## 2019-07-27 DIAGNOSIS — F329 Major depressive disorder, single episode, unspecified: Secondary | ICD-10-CM | POA: Diagnosis not present

## 2019-07-27 MED ORDER — CITALOPRAM HYDROBROMIDE 10 MG PO TABS: 10.0000 mg | ORAL_TABLET | Freq: Every day | ORAL | 1 refills | Status: DC

## 2019-07-27 NOTE — Assessment & Plan Note (Signed)
Patient is due for labs.  Will obtain a lipid panel.

## 2019-07-27 NOTE — Progress Notes (Signed)
Virtual Visit via video Note  This visit type was conducted due to national recommendations for restrictions regarding the COVID-19 pandemic (e.g. social distancing).  This format is felt to be most appropriate for this patient at this time.  All issues noted in this document were discussed and addressed.  No physical exam was performed (except for noted visual exam findings with Video Visits).   I connected with Arnette Felts today at  8:00 AM EST by a video enabled telemedicine application and verified that I am speaking with the correct person using two identifiers. Location patient: home Location provider: home office Persons participating in the virtual visit: patient, provider  I discussed the limitations, risks, security and privacy concerns of performing an evaluation and management service by telephone and the availability of in person appointments. I also discussed with the patient that there may be a patient responsible charge related to this service. The patient expressed understanding and agreed to proceed.  Reason for visit: follow-up  HPI: Anxiety/depression: Patient notes she came off of the Celexa on her own.  This was related to the pandemic and having to homeschool her son and then she also felt as though she was doing pretty good for a while so she would see how she did off of it.  She notes her anxiety has gotten increasingly worse since coming off of the Celexa.  She notes any little thing hits her harder than she thinks it should.  She feels somewhat down though no significant depression.  No SI.  She has been doing some self-help things at home to help with this.  She has not been doing therapy.  She did quit smoking.   ROS: See pertinent positives and negatives per HPI.  Past Medical History:  Diagnosis Date  . Chlamydia 01/25/2010  . Depression with anxiety   . Gestational hypertension   . Immunization, viral disease    gardasil series completed  . Tobacco  abuse 05/02/2016    Past Surgical History:  Procedure Laterality Date  . BREAST ENHANCEMENT SURGERY  2015  . CESAREAN SECTION  2014  . TONSILLECTOMY      Family History  Problem Relation Age of Onset  . Breast cancer Other 35       mat 2nd cousin  . Diabetes Neg Hx   . Heart disease Neg Hx   . Hypertension Neg Hx   . Ovarian cancer Neg Hx   . Colon cancer Neg Hx     SOCIAL HX: Former smoker   Current Outpatient Medications:  .  norgestimate-ethinyl estradiol (SPRINTEC 28) 0.25-35 MG-MCG tablet, Take 1 tablet by mouth daily., Disp: 84 tablet, Rfl: 3 .  citalopram (CELEXA) 10 MG tablet, Take 1 tablet (10 mg total) by mouth daily., Disp: 90 tablet, Rfl: 1  EXAM:  VITALS per patient if applicable:  GENERAL: alert, oriented, appears well and in no acute distress  HEENT: atraumatic, conjunttiva clear, no obvious abnormalities on inspection of external nose and ears  NECK: normal movements of the head and neck  LUNGS: on inspection no signs of respiratory distress, breathing rate appears normal, no obvious gross SOB, gasping or wheezing  CV: no obvious cyanosis  MS: moves all visible extremities without noticeable abnormality  PSYCH/NEURO: pleasant and cooperative, no obvious depression or anxiety, speech and thought processing grossly intact  ASSESSMENT AND PLAN:  Discussed the following assessment and plan:  Anxiety and depression Anxiety is worsened.  Will restart Celexa.  Encouraged continued self-help strategies at  home.  We will see her for follow-up in 2 to 3 months.  We will check a CMP given that we are starting Celexa and it has been quite sometime since she has had a sodium check  Lipid screening Patient is due for labs.  Will obtain a lipid panel.   Orders Placed This Encounter  Procedures  . Comp Met (CMET)    Standing Status:   Future    Standing Expiration Date:   07/26/2020  . Lipid panel    Standing Status:   Future    Standing Expiration Date:    07/26/2020    Meds ordered this encounter  Medications  . citalopram (CELEXA) 10 MG tablet    Sig: Take 1 tablet (10 mg total) by mouth daily.    Dispense:  90 tablet    Refill:  1     I discussed the assessment and treatment plan with the patient. The patient was provided an opportunity to ask questions and all were answered. The patient agreed with the plan and demonstrated an understanding of the instructions.   The patient was advised to call back or seek an in-person evaluation if the symptoms worsen or if the condition fails to improve as anticipated.   Tommi Rumps, MD

## 2019-07-27 NOTE — Assessment & Plan Note (Addendum)
Anxiety is worsened.  Will restart Celexa.  Encouraged continued self-help strategies at home.  We will see her for follow-up in 2 to 3 months.  We will check a CMP given that we are starting Celexa and it has been quite sometime since she has had a sodium check

## 2019-08-25 ENCOUNTER — Other Ambulatory Visit: Payer: Self-pay

## 2019-08-25 ENCOUNTER — Other Ambulatory Visit (INDEPENDENT_AMBULATORY_CARE_PROVIDER_SITE_OTHER): Payer: BC Managed Care – PPO

## 2019-08-25 DIAGNOSIS — F329 Major depressive disorder, single episode, unspecified: Secondary | ICD-10-CM

## 2019-08-25 DIAGNOSIS — F419 Anxiety disorder, unspecified: Secondary | ICD-10-CM | POA: Diagnosis not present

## 2019-08-25 DIAGNOSIS — Z13220 Encounter for screening for lipoid disorders: Secondary | ICD-10-CM | POA: Diagnosis not present

## 2019-08-25 DIAGNOSIS — F32A Depression, unspecified: Secondary | ICD-10-CM

## 2019-08-25 LAB — LIPID PANEL
Cholesterol: 175 mg/dL (ref 0–200)
HDL: 45.6 mg/dL (ref >39.00)
LDL Cholesterol: 113 mg/dL — ABNORMAL HIGH (ref 0–99)
NonHDL: 129.65
Total CHOL/HDL Ratio: 4
Triglycerides: 83 mg/dL (ref 0.0–149.0)
VLDL: 16.6 mg/dL (ref 0.0–40.0)

## 2019-08-25 LAB — COMPREHENSIVE METABOLIC PANEL
ALT: 6 U/L (ref 0–35)
AST: 12 U/L (ref 0–37)
Albumin: 4.1 g/dL (ref 3.5–5.2)
Alkaline Phosphatase: 59 U/L (ref 39–117)
BUN: 7 mg/dL (ref 6–23)
CO2: 26 mEq/L (ref 19–32)
Calcium: 9.3 mg/dL (ref 8.4–10.5)
Chloride: 105 mEq/L (ref 96–112)
Creatinine, Ser: 0.67 mg/dL (ref 0.40–1.20)
GFR: 105.49 mL/min (ref >60.00)
Glucose, Bld: 63 mg/dL — ABNORMAL LOW (ref 70–99)
Potassium: 4 mEq/L (ref 3.5–5.1)
Sodium: 138 mEq/L (ref 135–145)
Total Bilirubin: 0.4 mg/dL (ref 0.2–1.2)
Total Protein: 7 g/dL (ref 6.0–8.3)

## 2019-08-28 NOTE — Progress Notes (Signed)
Eileen Luis, MD   Chief Complaint  Patient presents with  . Breast exam    lump on LB noticed last week, no tenderness    HPI:      Eileen Hart is a 29 y.o. G2P1011 who LMP was Patient's last menstrual period was 08/18/2019 (exact date)., presents today for LT breast mass the past wk, noticed on SBE. No change in size since first noticed, no erythema, tenderness, trauma, nipple d/c. No hx of breast masses, but FH benign breast masses/fibroadenoma in her mom and MGM. No prior breast imaging. S/p breast aug about 6 yrs ago. FH breast cancer in her 2nd cousin.    Past Medical History:  Diagnosis Date  . Chlamydia 01/25/2010  . Depression with anxiety   . Gestational hypertension   . Immunization, viral disease    gardasil series completed  . Tobacco abuse 05/02/2016    Past Surgical History:  Procedure Laterality Date  . BREAST ENHANCEMENT SURGERY  2015  . CESAREAN SECTION  2014  . TONSILLECTOMY      Family History  Problem Relation Age of Onset  . Breast cancer Other 35       mat 2nd cousin  . Diabetes Neg Hx   . Heart disease Neg Hx   . Hypertension Neg Hx   . Ovarian cancer Neg Hx   . Colon cancer Neg Hx     Social History   Socioeconomic History  . Marital status: Single    Spouse name: Not on file  . Number of children: 1  . Years of education: 41  . Highest education level: Not on file  Occupational History  . Occupation: Groomer    Comment: [employer]  Tobacco Use  . Smoking status: Former Smoker    Packs/day: 0.50    Types: Cigarettes    Quit date: 07/25/2018    Years since quitting: 1.0  . Smokeless tobacco: Never Used  . Tobacco comment: quit 07/2018  Substance and Sexual Activity  . Alcohol use: No    Alcohol/week: 0.0 standard drinks  . Drug use: No  . Sexual activity: Yes    Birth control/protection: Pill  Other Topics Concern  . Not on file  Social History Narrative  . Not on file   Social Determinants of Health     Financial Resource Strain:   . Difficulty of Paying Living Expenses: Not on file  Food Insecurity:   . Worried About Programme researcher, broadcasting/film/video in the Last Year: Not on file  . Ran Out of Food in the Last Year: Not on file  Transportation Needs:   . Lack of Transportation (Medical): Not on file  . Lack of Transportation (Non-Medical): Not on file  Physical Activity:   . Days of Exercise per Week: Not on file  . Minutes of Exercise per Session: Not on file  Stress:   . Feeling of Stress : Not on file  Social Connections:   . Frequency of Communication with Friends and Family: Not on file  . Frequency of Social Gatherings with Friends and Family: Not on file  . Attends Religious Services: Not on file  . Active Member of Clubs or Organizations: Not on file  . Attends Banker Meetings: Not on file  . Marital Status: Not on file  Intimate Partner Violence:   . Fear of Current or Ex-Partner: Not on file  . Emotionally Abused: Not on file  . Physically Abused: Not on  file  . Sexually Abused: Not on file    Outpatient Medications Prior to Visit  Medication Sig Dispense Refill  . citalopram (CELEXA) 10 MG tablet Take 1 tablet (10 mg total) by mouth daily. 90 tablet 1  . norgestimate-ethinyl estradiol (SPRINTEC 28) 0.25-35 MG-MCG tablet Take 1 tablet by mouth daily. 84 tablet 3   No facility-administered medications prior to visit.      ROS:  Review of Systems  Constitutional: Negative for fever.  Gastrointestinal: Negative for blood in stool, constipation, diarrhea, nausea and vomiting.  Genitourinary: Negative for dyspareunia, dysuria, flank pain, frequency, hematuria, urgency, vaginal bleeding, vaginal discharge and vaginal pain.  Musculoskeletal: Negative for back pain.  Skin: Negative for rash.   BREAST: mass   OBJECTIVE:   Vitals:  BP 110/80   Ht 5\' 4"  (1.626 m)   Wt 111 lb (50.3 kg)   LMP 08/18/2019 (Exact Date)   BMI 19.05 kg/m   Physical  Exam Vitals reviewed.  Pulmonary:     Effort: Pulmonary effort is normal.  Chest:     Breasts: Breasts are symmetrical.        Right: No inverted nipple, mass, nipple discharge, skin change or tenderness.        Left: Mass present. No inverted nipple, nipple discharge, skin change or tenderness.    Musculoskeletal:        General: Normal range of motion.     Cervical back: Normal range of motion.  Skin:    General: Skin is warm and dry.  Neurological:     General: No focal deficit present.     Mental Status: She is alert and oriented to person, place, and time.     Cranial Nerves: No cranial nerve deficit.  Psychiatric:        Mood and Affect: Mood normal.        Behavior: Behavior normal.        Thought Content: Thought content normal.        Judgment: Judgment normal.     Assessment/Plan: Left breast mass - Plan: US BREAST LTD UNI LEFT INC AXILLA; 2:00 POS on exam. Check u/s. Will f/u with results. Most likely benign due to age/FH.    Return if symptoms worsen or fail to improve.  Rickita Forstner B. Mandie Crabbe, PA-C 08/30/2019 10:06 AM

## 2019-08-30 ENCOUNTER — Other Ambulatory Visit: Payer: Self-pay

## 2019-08-30 ENCOUNTER — Encounter: Payer: Self-pay | Admitting: Obstetrics and Gynecology

## 2019-08-30 ENCOUNTER — Ambulatory Visit (INDEPENDENT_AMBULATORY_CARE_PROVIDER_SITE_OTHER): Payer: BC Managed Care – PPO | Admitting: Obstetrics and Gynecology

## 2019-08-30 VITALS — BP 110/80 | Ht 64.0 in | Wt 111.0 lb

## 2019-08-30 DIAGNOSIS — Z803 Family history of malignant neoplasm of breast: Secondary | ICD-10-CM | POA: Diagnosis not present

## 2019-08-30 DIAGNOSIS — N6321 Unspecified lump in the left breast, upper outer quadrant: Secondary | ICD-10-CM

## 2019-08-30 DIAGNOSIS — N6320 Unspecified lump in the left breast, unspecified quadrant: Secondary | ICD-10-CM

## 2019-08-30 NOTE — Patient Instructions (Signed)
I value your feedback and entrusting us with your care. If you get a [facility] patient survey, I would appreciate you taking the time to let us know about your experience today. Thank you!  As of June 03, 2019, your lab results will be released to your MyChart immediately, before I even have a chance to see them. Please give me time to review them and contact you if there are any abnormalities. Thank you for your patience.  

## 2019-09-06 ENCOUNTER — Ambulatory Visit
Admission: RE | Admit: 2019-09-06 | Discharge: 2019-09-06 | Disposition: A | Discharge status: Home / Self Care | Payer: BC Managed Care – PPO | Source: Ambulatory Visit | Attending: Obstetrics and Gynecology | Admitting: Obstetrics and Gynecology

## 2019-09-06 DIAGNOSIS — N6320 Unspecified lump in the left breast, unspecified quadrant: Secondary | ICD-10-CM | POA: Insufficient documentation

## 2019-09-06 DIAGNOSIS — N6321 Unspecified lump in the left breast, upper outer quadrant: Secondary | ICD-10-CM | POA: Diagnosis not present

## 2019-09-22 ENCOUNTER — Other Ambulatory Visit: Payer: Self-pay

## 2019-09-27 ENCOUNTER — Ambulatory Visit: Payer: BC Managed Care – PPO | Admitting: Family Medicine

## 2019-09-27 ENCOUNTER — Encounter: Payer: Self-pay | Admitting: Family Medicine

## 2019-09-27 ENCOUNTER — Other Ambulatory Visit: Payer: Self-pay

## 2019-09-27 DIAGNOSIS — F419 Anxiety disorder, unspecified: Secondary | ICD-10-CM

## 2019-09-27 DIAGNOSIS — F329 Major depressive disorder, single episode, unspecified: Secondary | ICD-10-CM

## 2019-09-27 DIAGNOSIS — F32A Depression, unspecified: Secondary | ICD-10-CM

## 2019-09-27 DIAGNOSIS — M25531 Pain in right wrist: Secondary | ICD-10-CM | POA: Diagnosis not present

## 2019-09-27 MED ORDER — DULOXETINE HCL 30 MG PO CPEP: ORAL_CAPSULE | ORAL | 3 refills | Status: DC

## 2019-09-27 NOTE — Assessment & Plan Note (Signed)
Well-controlled and having side effects related to the Celexa.  We will switch to Cymbalta.  We will follow-up in 2 months.

## 2019-09-27 NOTE — Patient Instructions (Addendum)
Nice to see you. We are going to switch you to Cymbalta. You will start this the day after stopping your Celexa. Please monitor your wrist.  You can take ibuprofen 600 mg every 8 hours for the next 3 to 4 days to see if that will help further.  If your wrist is not completely improved in the next 1 to 2 weeks please let us know.

## 2019-09-27 NOTE — Assessment & Plan Note (Signed)
Improving over the last week or so.  Benign exam.  She will continue the brace.  Discussed 3 to 4 days of scheduled ibuprofen 600 mg every 8 hours.  Discussed taking this with food and if it irritates her stomach she will let us know.  If not improving over the next 1 to 2 weeks she will let us know and we can refer her to sports medicine.

## 2019-09-27 NOTE — Progress Notes (Signed)
  Marikay Alar, MD Phone: [phone]  Eileen Hart is a 29 y.o. female who presents today for f/u.  Anxiety/depression: Patient notes her anxiety is quite a bit better.  No SI or HI.  No depression.  She does report some sexual dysfunction side effects with the Celexa and having difficulty orgasmic with this.  She is on Zoloft previously and felt like a zombie.  Right wrist pain: Patient notes she took all the handles off of her cabinets and put them back on and then took them off again back in December.  She thinks repetitive motion caused the right wrist pain.  It is on the radial volar aspect.  No specific injury.  Hurts now with pushing on it.  No numbness, weakness, or tingling.  Notes it has progressively been improving over the last week.  She bought a brace that has been helpful.  Social History   Tobacco Use  Smoking Status Former Smoker  . Packs/day: 0.50  . Types: Cigarettes  . Quit date: 07/25/2018  . Years since quitting: 1.1  Smokeless Tobacco Never Used  Tobacco Comment   quit 07/2018     ROS see history of present illness  Objective  Physical Exam Vitals:   09/27/19 1107  BP: 120/80  Pulse: 86  Temp: 97.7 F (36.5 C)  SpO2: 99%    BP Readings from Last 3 Encounters:  09/27/19 120/80  08/30/19 110/80  07/06/19 120/90   Wt Readings from Last 3 Encounters:  09/27/19 114 lb 6.4 oz (51.9 kg)  08/30/19 111 lb (50.3 kg)  07/27/19 110 lb (49.9 kg)    Physical Exam Constitutional:      General: She is not in acute distress. Musculoskeletal:       Arms:  Neurological:     Mental Status: She is alert.  Psychiatric:        Mood and Affect: Mood normal.      Assessment/Plan: Please see individual problem list.  Anxiety and depression Well-controlled and having side effects related to the Celexa.  We will switch to Cymbalta.  We will follow-up in 2 months.  Right wrist pain Improving over the last week or so.  Benign exam.  She will continue  the brace.  Discussed 3 to 4 days of scheduled ibuprofen 600 mg every 8 hours.  Discussed taking this with food and if it irritates her stomach she will let us know.  If not improving over the next 1 to 2 weeks she will let us know and we can refer her to sports medicine.   No orders of the defined types were placed in this encounter.   Meds ordered this encounter  Medications  . DULoxetine (CYMBALTA) 30 MG capsule    Sig: Take 1 capsule (30 mg total) by mouth daily for 7 days, THEN 2 capsules (60 mg total) daily.    Dispense:  60 capsule    Refill:  3    This visit occurred during the SARS-CoV-2 public health emergency.  Safety protocols were in place, including screening questions prior to the visit, additional usage of staff PPE, and extensive cleaning of exam room while observing appropriate contact time as indicated for disinfecting solutions.    Marikay Alar, MD [facility]

## 2019-10-04 ENCOUNTER — Ambulatory Visit: Payer: BC Managed Care – PPO | Admitting: Certified Nurse Midwife

## 2019-10-06 ENCOUNTER — Ambulatory Visit (INDEPENDENT_AMBULATORY_CARE_PROVIDER_SITE_OTHER): Payer: BC Managed Care – PPO | Admitting: Certified Nurse Midwife

## 2019-10-06 ENCOUNTER — Other Ambulatory Visit: Payer: Self-pay

## 2019-10-06 VITALS — BP 118/74 | Ht 64.0 in | Wt 115.0 lb

## 2019-10-06 DIAGNOSIS — R030 Elevated blood-pressure reading, without diagnosis of hypertension: Secondary | ICD-10-CM | POA: Diagnosis not present

## 2019-10-06 NOTE — Progress Notes (Signed)
  History of Present Illness:  AMBERA Hart is a 29 y.o. who had an elevated blood pressure at the time of her last annual in January. She presents for a blood pressure check. She is taking Sprintec for contraception. Since her last visit she has been on medication for anxiety and depression. Has a long history of anxiety that worsened after the birth of her last child 6 years ago. She was on Celexa but was switched to Cymbalta just recently. She also had a left breast lump evaluated last month. The mass appeared to be a fibroadenoma on ultrasound and she will be having a follow up ultrasound in September.  PMHx: She  has a past medical history of Chlamydia (01/25/2010), Depression with anxiety, Gestational hypertension, Immunization, viral disease, and Tobacco abuse (05/02/2016). Also,  has a past surgical history that includes Breast enhancement surgery (2015); Tonsillectomy; and Cesarean section (2014)., family history includes Breast cancer (age of onset: 69) in an other family member.,  reports that she quit smoking about 14 months ago. Her smoking use included cigarettes. She smoked 0.50 packs per day. She has never used smokeless tobacco. She reports that she does not drink alcohol or use drugs.  She has a current medication list which includes the following prescription(s): duloxetine and norgestimate-ethinyl estradiol. Also, has No Known Allergies.  ROS  Physical Exam:  BP 118/74   Ht 5\' 4"  (1.626 m)   Wt 115 lb (52.2 kg)   LMP 09/18/2019 (Exact Date)   BMI 19.74 kg/m  Body mass index is 19.74 kg/m. Constitutional: Well nourished, well developed female in no acute distress.  Neuro: Grossly intact Psych:  Normal mood and affect.    Assessment: Normotensive  Plan: She will continue on her Sprintec and will have her come back for her annual in January 2022   February 2022, CNM [facility], [facility] 10/06/2019  9:05 AM

## 2019-10-10 ENCOUNTER — Encounter: Payer: Self-pay | Admitting: Certified Nurse Midwife

## 2019-10-21 ENCOUNTER — Other Ambulatory Visit: Payer: Self-pay | Admitting: Family Medicine

## 2019-11-29 ENCOUNTER — Ambulatory Visit: Payer: BC Managed Care – PPO | Admitting: Family Medicine

## 2019-11-29 ENCOUNTER — Ambulatory Visit (INDEPENDENT_AMBULATORY_CARE_PROVIDER_SITE_OTHER): Payer: BC Managed Care – PPO

## 2019-11-29 ENCOUNTER — Encounter: Payer: Self-pay | Admitting: Family Medicine

## 2019-11-29 ENCOUNTER — Other Ambulatory Visit: Payer: Self-pay

## 2019-11-29 VITALS — BP 120/70 | HR 88 | Temp 95.9°F | Ht 64.0 in | Wt 117.0 lb

## 2019-11-29 DIAGNOSIS — F32A Depression, unspecified: Secondary | ICD-10-CM

## 2019-11-29 DIAGNOSIS — F329 Major depressive disorder, single episode, unspecified: Secondary | ICD-10-CM

## 2019-11-29 DIAGNOSIS — F419 Anxiety disorder, unspecified: Secondary | ICD-10-CM

## 2019-11-29 DIAGNOSIS — M25531 Pain in right wrist: Secondary | ICD-10-CM

## 2019-11-29 NOTE — Patient Instructions (Signed)
Nice to see you. Please continue the Cymbalta. We will get an x-ray ordered and refer you to orthopedics.

## 2019-11-29 NOTE — Assessment & Plan Note (Signed)
X-ray today.  Refer to orthopedics. 

## 2019-11-29 NOTE — Assessment & Plan Note (Signed)
Well-controlled on Cymbalta.  No side effects with this.  She will continue with the Cymbalta.

## 2019-11-29 NOTE — Progress Notes (Signed)
  Marikay Alar, MD Phone: [phone]  Eileen Hart is a 29 y.o. female who presents today for f/u.  Anxiety/depression: Patient notes the symptoms are quite a bit improved.  Cymbalta has been helpful.  She has had no side effects from this.  No SI.  Right wrist pain: This continues to be an issue.  If she is wearing the brace she does quite well.  If she takes the brace off and moves her wrist it starts to hurt.  Sometimes is tender at other times its not.  Social History   Tobacco Use  Smoking Status Former Smoker  . Packs/day: 0.50  . Types: Cigarettes  . Quit date: 07/25/2018  . Years since quitting: 1.3  Smokeless Tobacco Never Used  Tobacco Comment   quit 07/2018     ROS see history of present illness  Objective  Physical Exam Vitals:   11/29/19 1044  BP: 120/70  Pulse: 88  Temp: (!) 95.9 F (35.5 C)  SpO2: 99%    BP Readings from Last 3 Encounters:  11/29/19 120/70  10/06/19 118/74  09/27/19 120/80   Wt Readings from Last 3 Encounters:  11/29/19 117 lb (53.1 kg)  10/06/19 115 lb (52.2 kg)  09/27/19 114 lb 6.4 oz (51.9 kg)    Physical Exam Constitutional:      General: She is not in acute distress. Musculoskeletal:     Comments: Right wrist tender over the radial volar aspect, minimal swelling in this area, no anatomic snuffbox tenderness  Neurological:     Mental Status: She is alert.      Assessment/Plan: Please see individual problem list.  Right wrist pain X-ray today.  Refer to orthopedics.  Anxiety and depression Well-controlled on Cymbalta.  No side effects with this.  She will continue with the Cymbalta.   Orders Placed This Encounter  Procedures  . DG Wrist Complete Right    Standing Status:   Future    Standing Expiration Date:   11/28/2020    Order Specific Question:   Reason for Exam (SYMPTOM  OR DIAGNOSIS REQUIRED)    Answer:   right wrist pain for several months    Order Specific Question:   Is patient pregnant?     Answer:   No    Order Specific Question:   Preferred imaging location?    Answer:   [facility] Specific Question:   Radiology Contrast Protocol - do NOT remove file path    Answer:   [email or web address]    No orders of the defined types were placed in this encounter.   This visit occurred during the SARS-CoV-2 public health emergency.  Safety protocols were in place, including screening questions prior to the visit, additional usage of staff PPE, and extensive cleaning of exam room while observing appropriate contact time as indicated for disinfecting solutions.    Marikay Alar, MD [facility]

## 2019-12-02 ENCOUNTER — Other Ambulatory Visit: Payer: Self-pay | Admitting: Family Medicine

## 2019-12-02 DIAGNOSIS — M25531 Pain in right wrist: Secondary | ICD-10-CM

## 2019-12-29 ENCOUNTER — Telehealth: Payer: Self-pay | Admitting: Family Medicine

## 2019-12-29 NOTE — Telephone Encounter (Signed)
Rejection Reason - Other - patient cancelled" [facility] said about 8 hours ago  "Patient's flight was canceled and she was not able to make it to her scheduled appointment, 12/21/19." [facility] said 8 days ago

## 2019-12-29 NOTE — Telephone Encounter (Signed)
Noted.  Please contact the patient to make sure she reschedules this.

## 2020-01-03 NOTE — Telephone Encounter (Signed)
I called the patient and she stated she would call them right away.  Nataleah Scioneaux,cma

## 2020-01-24 DIAGNOSIS — M25531 Pain in right wrist: Secondary | ICD-10-CM | POA: Diagnosis not present

## 2020-01-24 DIAGNOSIS — G8929 Other chronic pain: Secondary | ICD-10-CM | POA: Diagnosis not present

## 2020-01-24 DIAGNOSIS — M659 Synovitis and tenosynovitis, unspecified: Secondary | ICD-10-CM | POA: Diagnosis not present

## 2020-03-06 DIAGNOSIS — H5213 Myopia, bilateral: Secondary | ICD-10-CM | POA: Diagnosis not present

## 2020-03-13 DIAGNOSIS — M25531 Pain in right wrist: Secondary | ICD-10-CM | POA: Diagnosis not present

## 2020-03-13 DIAGNOSIS — G8929 Other chronic pain: Secondary | ICD-10-CM | POA: Diagnosis not present

## 2020-06-05 ENCOUNTER — Other Ambulatory Visit: Payer: Self-pay

## 2020-06-05 ENCOUNTER — Encounter: Payer: Self-pay | Admitting: Family Medicine

## 2020-06-05 ENCOUNTER — Ambulatory Visit: Payer: BC Managed Care – PPO | Admitting: Family Medicine

## 2020-06-05 DIAGNOSIS — Z3041 Encounter for surveillance of contraceptive pills: Secondary | ICD-10-CM

## 2020-06-05 DIAGNOSIS — F419 Anxiety disorder, unspecified: Secondary | ICD-10-CM | POA: Diagnosis not present

## 2020-06-05 DIAGNOSIS — M25531 Pain in right wrist: Secondary | ICD-10-CM | POA: Diagnosis not present

## 2020-06-05 DIAGNOSIS — F32A Depression, unspecified: Secondary | ICD-10-CM

## 2020-06-05 MED ORDER — NORGESTIMATE-ETH ESTRADIOL 0.25-35 MG-MCG PO TABS: 1.0000 | ORAL_TABLET | Freq: Every day | ORAL | 3 refills | Status: DC

## 2020-06-05 NOTE — Patient Instructions (Signed)
Nice to see you. I would encourage you to get the flu vaccine and Covid vaccine when you ready.

## 2020-06-05 NOTE — Assessment & Plan Note (Signed)
Chronic ongoing issue.  She will continue conservative management.  If it becomes persistently bothersome she will follow-up with orthopedics to have an MRI.

## 2020-06-05 NOTE — Progress Notes (Signed)
Marikay Alar, MD Phone: [phone]  Eileen Hart is a 29 y.o. female who presents today for f/u.  Anxiety/depression: Patient notes this is better since she quit her job.  She notes no real significant symptoms.  She notes things are going well considering she currently has a lawsuit against her.  Takes Cymbalta.  No SI.  Right wrist pain: This is a chronic issue.  She saw orthopedics and was diagnosed with tendinitis.  She did exercises for this and with occasionally take ibuprofen.  Now it only bothers her if she does something at work.  She wears her brace at night.  Orthopedics recommended an MRI if she had a prolonged flare to evaluate further.  Oral contraceptive management: Patient notes she has been on the same regimen for about 12 years.  She has 1 menstrual cycle once monthly lasting 4 to 5 days.  No intermenstrual bleeding.  No pain with her menstrual cycles.  She has 1 sexual partner who is a female.  They do not use condoms.  Social History   Tobacco Use  Smoking Status Former Smoker  . Packs/day: 0.50  . Types: Cigarettes  . Quit date: 07/25/2018  . Years since quitting: 1.8  Smokeless Tobacco Never Used  Tobacco Comment   quit 07/2018     ROS see history of present illness  Objective  Physical Exam Vitals:   06/05/20 0832  BP: 120/70  Pulse: 97  Temp: 99 F (37.2 C)  SpO2: 99%    BP Readings from Last 3 Encounters:  06/05/20 120/70  11/29/19 120/70  10/06/19 118/74   Wt Readings from Last 3 Encounters:  06/05/20 139 lb 12.8 oz (63.4 kg)  11/29/19 117 lb (53.1 kg)  10/06/19 115 lb (52.2 kg)    Physical Exam Constitutional:      General: She is not in acute distress.    Appearance: She is not diaphoretic.  Cardiovascular:     Rate and Rhythm: Normal rate and regular rhythm.     Heart sounds: Normal heart sounds.  Pulmonary:     Effort: Pulmonary effort is normal.     Breath sounds: Normal breath sounds.  Musculoskeletal:        General: No  edema.  Skin:    General: Skin is warm and dry.  Neurological:     Mental Status: She is alert.      Assessment/Plan: Please see individual problem list.  Problem List Items Addressed This Visit    Anxiety and depression    Stable.  Continue Cymbalta 60 mg once daily.  Discussed the potential for tapering off of this at her next visit in 6 months.      Contraceptive management    Patient has been stable on Sprintec 1 tablet daily.  We will refill this.  Discussed small risk of blood clot.  She will continue to see her GYN for gynecologic issues/screening.      Relevant Medications   norgestimate-ethinyl estradiol (SPRINTEC 28) 0.25-35 MG-MCG tablet   Right wrist pain    Chronic ongoing issue.  She will continue conservative management.  If it becomes persistently bothersome she will follow-up with orthopedics to have an MRI.          Health Maintenance: Patient declines Covid vaccine and flu vaccine at this time.   This visit occurred during the SARS-CoV-2 public health emergency.  Safety protocols were in place, including screening questions prior to the visit, additional usage of staff PPE, and extensive cleaning  of exam room while observing appropriate contact time as indicated for disinfecting solutions.    Tommi Rumps, MD [place]

## 2020-06-05 NOTE — Assessment & Plan Note (Signed)
Stable.  Continue Cymbalta 60 mg once daily.  Discussed the potential for tapering off of this at her next visit in 6 months.

## 2020-06-05 NOTE — Assessment & Plan Note (Signed)
Patient has been stable on Sprintec 1 tablet daily.  We will refill this.  Discussed small risk of blood clot.  She will continue to see her GYN for gynecologic issues/screening.

## 2020-09-08 ENCOUNTER — Other Ambulatory Visit: Payer: Self-pay | Admitting: Family Medicine

## 2020-12-04 ENCOUNTER — Other Ambulatory Visit: Payer: Self-pay

## 2020-12-04 ENCOUNTER — Ambulatory Visit (INDEPENDENT_AMBULATORY_CARE_PROVIDER_SITE_OTHER): Payer: 59 | Admitting: Family Medicine

## 2020-12-04 ENCOUNTER — Encounter: Payer: Self-pay | Admitting: Family Medicine

## 2020-12-04 VITALS — BP 120/82 | HR 98 | Temp 98.0°F | Ht 64.0 in | Wt 147.4 lb

## 2020-12-04 DIAGNOSIS — Z1159 Encounter for screening for other viral diseases: Secondary | ICD-10-CM

## 2020-12-04 DIAGNOSIS — Z23 Encounter for immunization: Secondary | ICD-10-CM

## 2020-12-04 DIAGNOSIS — F419 Anxiety disorder, unspecified: Secondary | ICD-10-CM | POA: Diagnosis not present

## 2020-12-04 DIAGNOSIS — M25531 Pain in right wrist: Secondary | ICD-10-CM | POA: Diagnosis not present

## 2020-12-04 DIAGNOSIS — F32A Depression, unspecified: Secondary | ICD-10-CM | POA: Diagnosis not present

## 2020-12-04 DIAGNOSIS — R635 Abnormal weight gain: Secondary | ICD-10-CM | POA: Diagnosis not present

## 2020-12-04 LAB — COMPREHENSIVE METABOLIC PANEL
ALT: 14 U/L (ref 0–35)
AST: 15 U/L (ref 0–37)
Albumin: 4.3 g/dL (ref 3.5–5.2)
Alkaline Phosphatase: 70 U/L (ref 39–117)
BUN: 8 mg/dL (ref 6–23)
CO2: 25 mEq/L (ref 19–32)
Calcium: 9.2 mg/dL (ref 8.4–10.5)
Chloride: 105 mEq/L (ref 96–112)
Creatinine, Ser: 0.68 mg/dL (ref 0.40–1.20)
GFR: 118.55 mL/min (ref >60.00)
Glucose, Bld: 80 mg/dL (ref 70–99)
Potassium: 4.3 mEq/L (ref 3.5–5.1)
Sodium: 139 mEq/L (ref 135–145)
Total Bilirubin: 0.4 mg/dL (ref 0.2–1.2)
Total Protein: 7.2 g/dL (ref 6.0–8.3)

## 2020-12-04 LAB — TSH: TSH: 0.62 u[IU]/mL (ref 0.35–4.50)

## 2020-12-04 NOTE — Assessment & Plan Note (Signed)
Possibly related to the Cymbalta though we will evaluate with lab work for other causes.

## 2020-12-04 NOTE — Progress Notes (Signed)
Tommi Rumps, MD Phone: [phone]  Eileen Hart is a 30 y.o. female who presents today for f/u.  Anxiety/depression: Patient notes this is well controlled.  Cymbalta.  No SI.  Weight gain: The patient notes she has not changed her diet.  There were 2 months where she was out of work and was less active though otherwise her activity level has been pretty stable.  She has chronic fatigue.  No itching.  No constipation.  No cold intolerance though does note some heat intolerance particularly at night.  She notes she would get hot and have fairly significant sweats previously 1-2 times a week though she lowered the temperature in her house from 72 F to 38 F and has noted significant improvement with very minimal sweating only 1-2 times a week.  Weight gain seem to start around the time of starting on the Cymbalta.  Right wrist pain: Patient notes this is stable.  She notes her wrist will only hurt with random movements.  She has not seen orthopedics in a while.  She has no pain at this time.  Social History   Tobacco Use  Smoking Status Former   Packs/day: 0.50   Pack years: 0.00   Types: Cigarettes   Quit date: 07/25/2018   Years since quitting: 2.3  Smokeless Tobacco Never  Tobacco Comments   quit 07/2018    Current Outpatient Medications on File Prior to Visit  Medication Sig Dispense Refill   DULoxetine (CYMBALTA) 30 MG capsule TAKE 1 CAPSULE (30 MG TOTAL) BY MOUTH DAILY FOR 7 DAYS, THEN 2 CAPSULES (60 MG TOTAL) DAILY. 180 capsule 2   norgestimate-ethinyl estradiol (SPRINTEC 28) 0.25-35 MG-MCG tablet Take 1 tablet by mouth daily. 84 tablet 3   No current facility-administered medications on file prior to visit.     ROS see history of present illness  Objective  Physical Exam Vitals:   12/04/20 0826  BP: 120/82  Pulse: 98  Temp: 98 F (36.7 C)  SpO2: 98%    BP Readings from Last 3 Encounters:  12/04/20 120/82  06/05/20 120/70  11/29/19 120/70   Wt Readings  from Last 3 Encounters:  12/04/20 147 lb 6.4 oz (66.9 kg)  06/05/20 139 lb 12.8 oz (63.4 kg)  11/29/19 117 lb (53.1 kg)    Physical Exam Constitutional:      General: She is not in acute distress.    Appearance: She is not diaphoretic.  Neck:     Thyroid: No thyroid mass, thyromegaly or thyroid tenderness.  Cardiovascular:     Rate and Rhythm: Normal rate and regular rhythm.     Heart sounds: Normal heart sounds.  Pulmonary:     Effort: Pulmonary effort is normal.     Breath sounds: Normal breath sounds.  Skin:    General: Skin is warm and dry.  Neurological:     Mental Status: She is alert.     Assessment/Plan: Please see individual problem list.  Problem List Items Addressed This Visit     Anxiety and depression - Primary    Well-controlled though she seems to be having side effects from the medication with sweating and weight gain.  Discussed checking lab work to evaluate for a cause of those symptoms other than the medication and if no cause is found we would change the Cymbalta to an alternative medication.       Right wrist pain    This is a chronic ongoing issue.  I encouraged her to follow-up with  orthopedics to have an MRI completed and to determine the next step in management.       Weight gain    Possibly related to the Cymbalta though we will evaluate with lab work for other causes.       Relevant Orders   TSH   Comp Met (CMET)   Other Visit Diagnoses     Need for Td vaccine       Relevant Orders   Td : Tetanus/diphtheria >7yo Preservative  free (Completed)   Need for hepatitis C screening test       Relevant Orders   Hepatitis C Antibody        Health Maintenance: Tetanus vaccine given today.  Hepatitis C screening completed.  The patient had HIV screening during her prior pregnancy.  Return in about 3 months (around 03/06/2021) for Anxiety/depression/weight gain.  This visit occurred during the SARS-CoV-2 public health emergency.  Safety  protocols were in place, including screening questions prior to the visit, additional usage of staff PPE, and extensive cleaning of exam room while observing appropriate contact time as indicated for disinfecting solutions.    Tommi Rumps, MD [place]

## 2020-12-04 NOTE — Assessment & Plan Note (Signed)
Well-controlled though she seems to be having side effects from the medication with sweating and weight gain.  Discussed checking lab work to evaluate for a cause of those symptoms other than the medication and if no cause is found we would change the Cymbalta to an alternative medication.

## 2020-12-04 NOTE — Assessment & Plan Note (Signed)
This is a chronic ongoing issue.  I encouraged her to follow-up with orthopedics to have an MRI completed and to determine the next step in management.

## 2020-12-04 NOTE — Patient Instructions (Signed)
Nice to see you. We will get some lab work today and if there are no causes for your weight gain we will change your Cymbalta to an alternative medication.

## 2020-12-05 LAB — HEPATITIS C ANTIBODY
Hepatitis C Ab: NONREACTIVE
SIGNAL TO CUT-OFF: 0.02 (ref <1.00)

## 2020-12-12 ENCOUNTER — Telehealth: Payer: Self-pay | Admitting: Family Medicine

## 2020-12-12 NOTE — Telephone Encounter (Signed)
Patient returned office phone call for lab resutls. 

## 2020-12-14 ENCOUNTER — Other Ambulatory Visit: Payer: Self-pay | Admitting: Family Medicine

## 2020-12-14 MED ORDER — ESCITALOPRAM OXALATE 10 MG PO TABS: 10.0000 mg | ORAL_TABLET | Freq: Every day | ORAL | 3 refills | Status: DC

## 2021-01-13 ENCOUNTER — Other Ambulatory Visit: Payer: Self-pay | Admitting: Family Medicine

## 2021-03-07 ENCOUNTER — Ambulatory Visit: Payer: 59 | Admitting: Family Medicine

## 2021-03-19 ENCOUNTER — Encounter: Payer: Self-pay | Admitting: Family Medicine

## 2021-03-19 ENCOUNTER — Other Ambulatory Visit: Payer: Self-pay

## 2021-03-19 ENCOUNTER — Ambulatory Visit (INDEPENDENT_AMBULATORY_CARE_PROVIDER_SITE_OTHER): Payer: 59 | Admitting: Family Medicine

## 2021-03-19 DIAGNOSIS — F419 Anxiety disorder, unspecified: Secondary | ICD-10-CM

## 2021-03-19 DIAGNOSIS — R635 Abnormal weight gain: Secondary | ICD-10-CM

## 2021-03-19 DIAGNOSIS — F32A Depression, unspecified: Secondary | ICD-10-CM

## 2021-03-19 NOTE — Patient Instructions (Signed)
Nice to see you. Please continue the Lexapro 10 mg once daily. Please try to start meal prepping and monitoring your diet more closely.  You should also start walking 2-3 times a week at a moderate pace for 20 to 30 minutes.

## 2021-03-19 NOTE — Progress Notes (Signed)
Marikay Alar, MD Phone: [phone]  Eileen Hart is a 30 y.o. female who presents today for follow-up.  Anxiety/depression: Patient is he things are doing fairly well.  She has been on Lexapro 10 mg once daily.  She notes the sweating resolved.  Her weight is down about 3 pounds.  She does note her patience has been a little thin though otherwise this seems to be well controlled.  No SI.  Weight gain: Patient has lost 3 pounds and is now back into the normal weight range.  She has not been exercising or monitoring her diet.  She wants to start walking for exercise and wants to meal prepping.  Social History   Tobacco Use  Smoking Status Former   Packs/day: 0.50   Types: Cigarettes   Quit date: 07/25/2018   Years since quitting: 2.6  Smokeless Tobacco Never  Tobacco Comments   quit 07/2018    Current Outpatient Medications on File Prior to Visit  Medication Sig Dispense Refill   escitalopram (LEXAPRO) 10 MG tablet TAKE 1 TABLET BY MOUTH EVERY DAY 90 tablet 2   norgestimate-ethinyl estradiol (SPRINTEC 28) 0.25-35 MG-MCG tablet Take 1 tablet by mouth daily. 84 tablet 3   No current facility-administered medications on file prior to visit.     ROS see history of present illness  Objective  Physical Exam Vitals:   03/19/21 0939  BP: 110/78  Pulse: 74  Temp: 98.7 F (37.1 C)  SpO2: 99%    BP Readings from Last 3 Encounters:  03/19/21 110/78  12/04/20 120/82  06/05/20 120/70   Wt Readings from Last 3 Encounters:  03/19/21 144 lb 12.8 oz (65.7 kg)  12/04/20 147 lb 6.4 oz (66.9 kg)  06/05/20 139 lb 12.8 oz (63.4 kg)    Physical Exam Constitutional:      General: She is not in acute distress.    Appearance: She is not diaphoretic.  Cardiovascular:     Rate and Rhythm: Normal rate and regular rhythm.     Heart sounds: Normal heart sounds.  Pulmonary:     Effort: Pulmonary effort is normal.     Breath sounds: Normal breath sounds.  Skin:    General: Skin  is warm and dry.  Neurological:     Mental Status: She is alert.     Assessment/Plan: Please see individual problem list.  Problem List Items Addressed This Visit     Anxiety and depression    Adequately controlled on Lexapro 10 mg once daily.  She will continue with this dose.  She will monitor her symptoms and if she has any worsening symptoms she will let us know.      Weight gain    The Cymbalta likely played a role in this though I suspect her lack of exercise and poor diet have played a role as well.  I encouraged her to start meal prepping and cooking at home.  Discussed adding in walking at a moderate pace for exercise at least 2 to 3 days a week.        Health Maintenance: she was encouraged to contact her GYN to set up a visit for her pap smear which is due in November.   Return in about 6 months (around 09/16/2021) for Anxiety/depression/weight.  This visit occurred during the SARS-CoV-2 public health emergency.  Safety protocols were in place, including screening questions prior to the visit, additional usage of staff PPE, and extensive cleaning of exam room while observing appropriate contact time  as indicated for disinfecting solutions.    Tommi Rumps, MD [place]

## 2021-03-19 NOTE — Assessment & Plan Note (Signed)
Adequately controlled on Lexapro 10 mg once daily.  She will continue with this dose.  She will monitor her symptoms and if she has any worsening symptoms she will let us know.

## 2021-03-19 NOTE — Assessment & Plan Note (Signed)
The Cymbalta likely played a role in this though I suspect her lack of exercise and poor diet have played a role as well.  I encouraged her to start meal prepping and cooking at home.  Discussed adding in walking at a moderate pace for exercise at least 2 to 3 days a week.

## 2021-06-06 IMAGING — US US BREAST*L* LIMITED INC AXILLA
1 series · 9 of 9 positions shown · non-contrast
Comparison: None.

CLINICAL DATA: 29-year-old female presenting for evaluation of a
palpable lump in the left breast.

EXAM:
ULTRASOUND OF THE LEFT BREAST

[Series 1: us breast*left* limited inc axilla · 0.05mm/px · 9 of 9 slices shown]
[im 1/9]
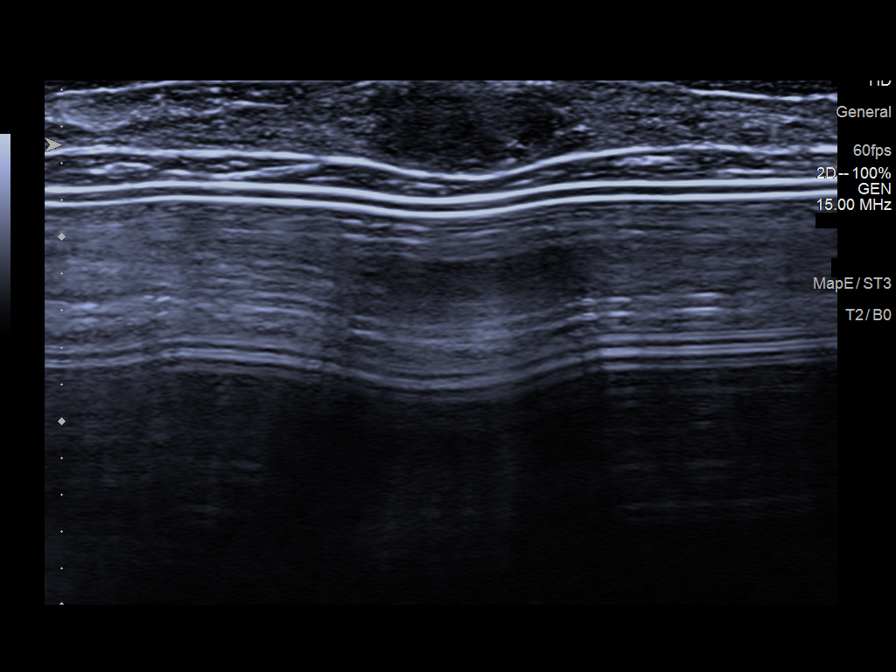
[im 2/9]
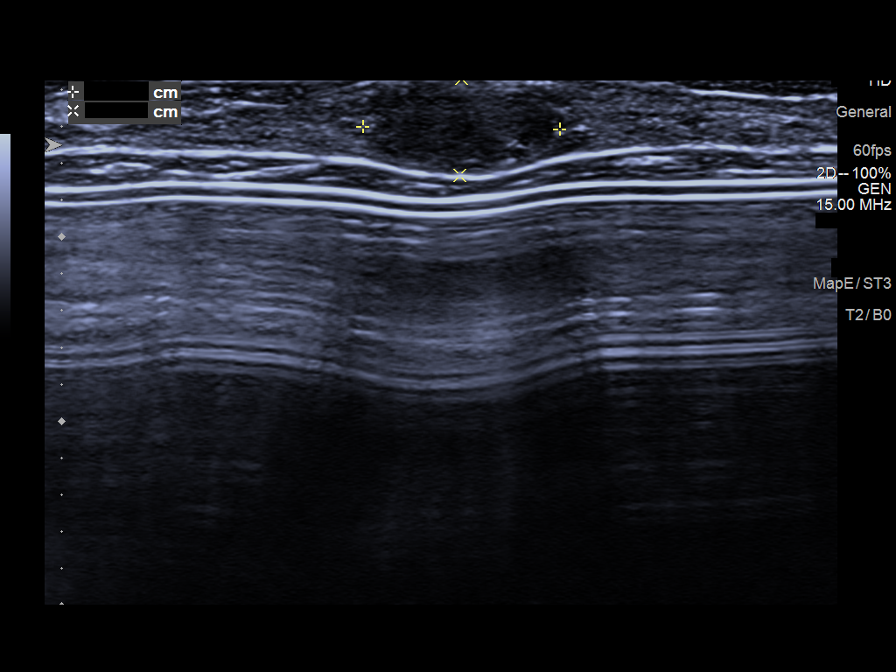
[im 3/9]
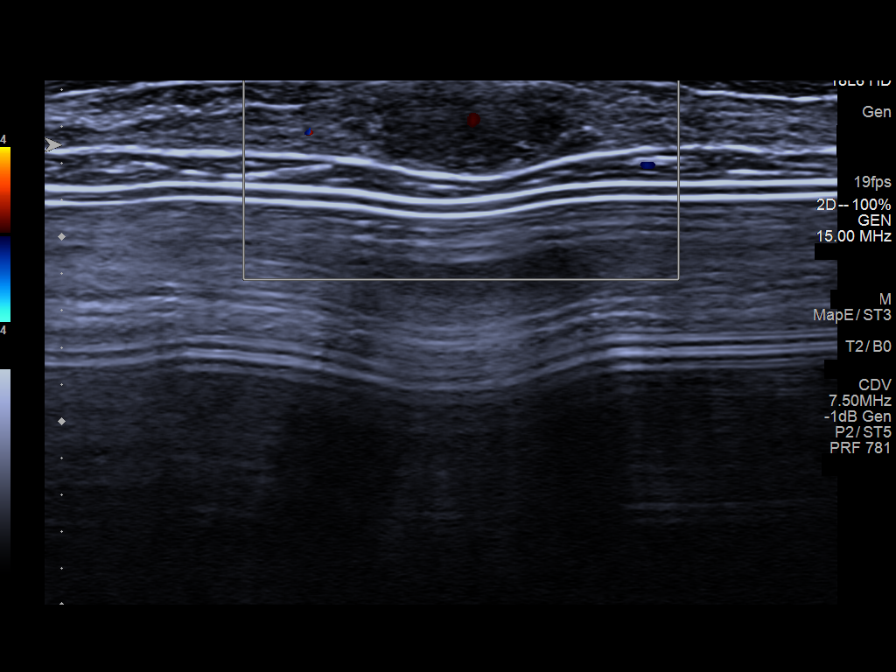
[im 4/9]
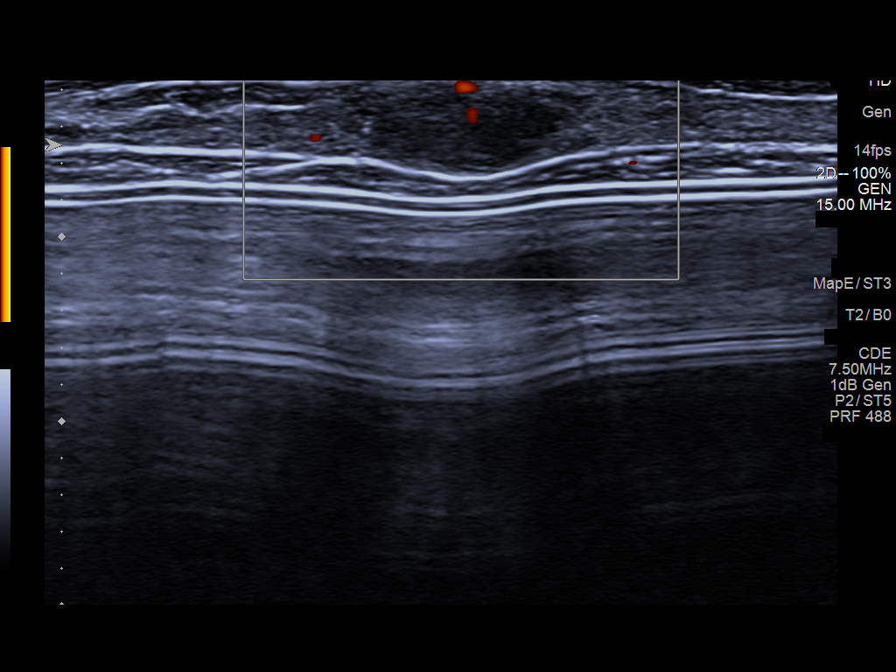
[im 5/9]
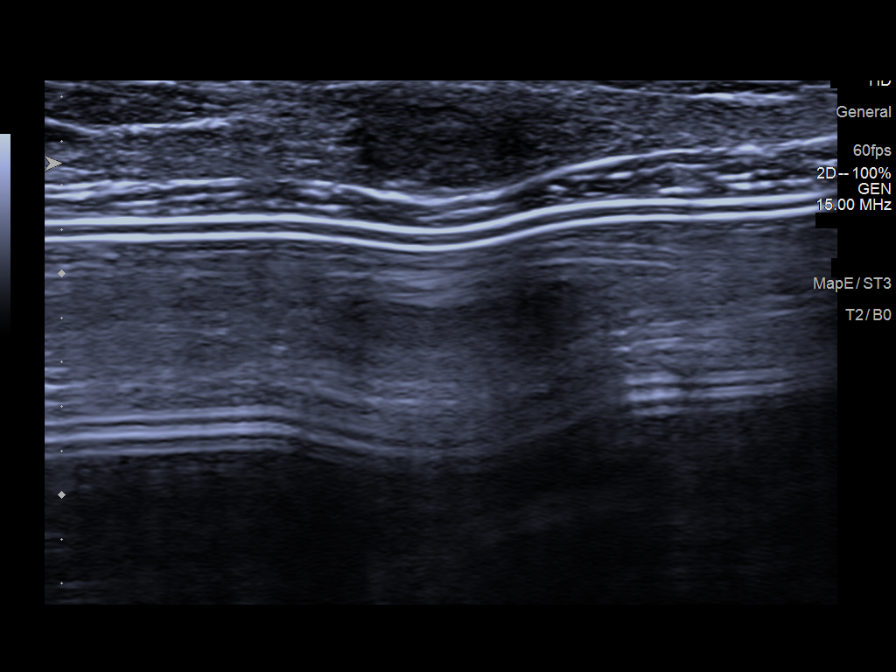
[im 6/9]
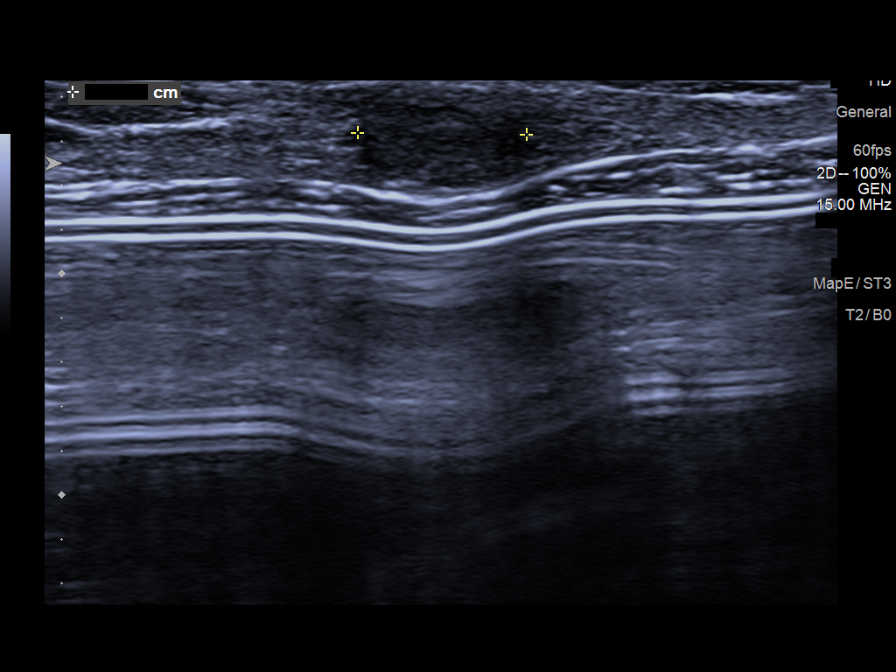
[im 7/9]
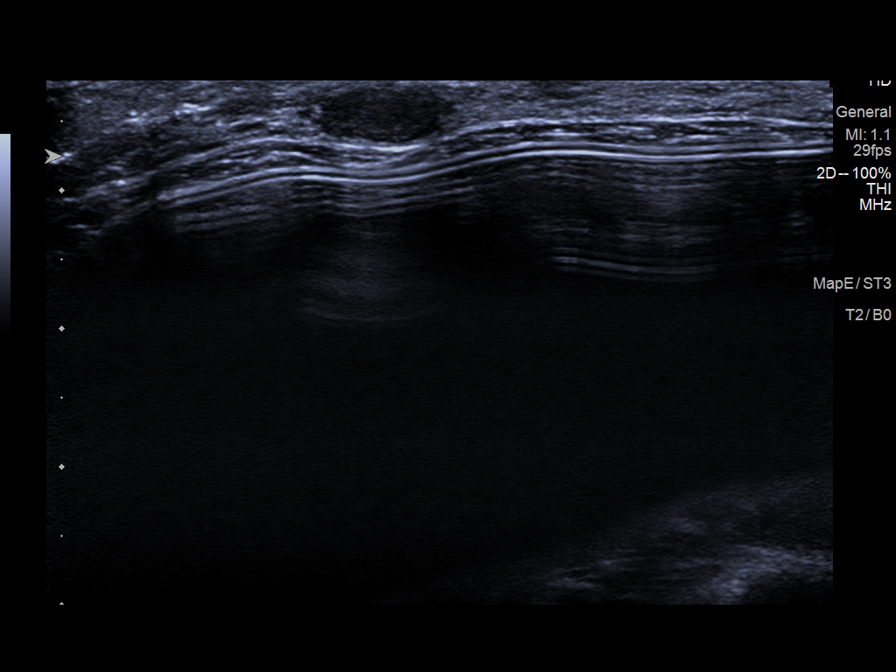
[im 8/9]
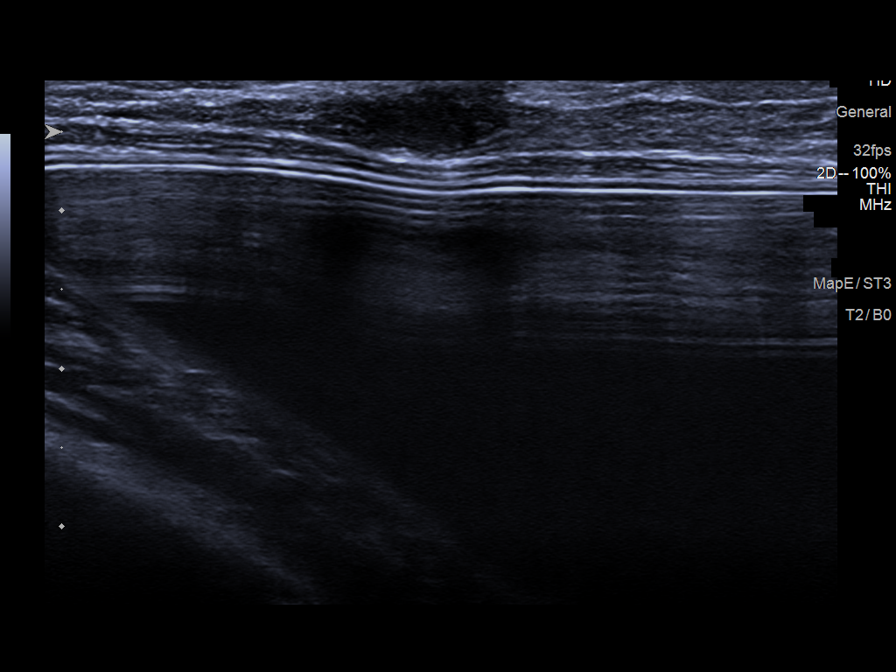
[im 9/9]
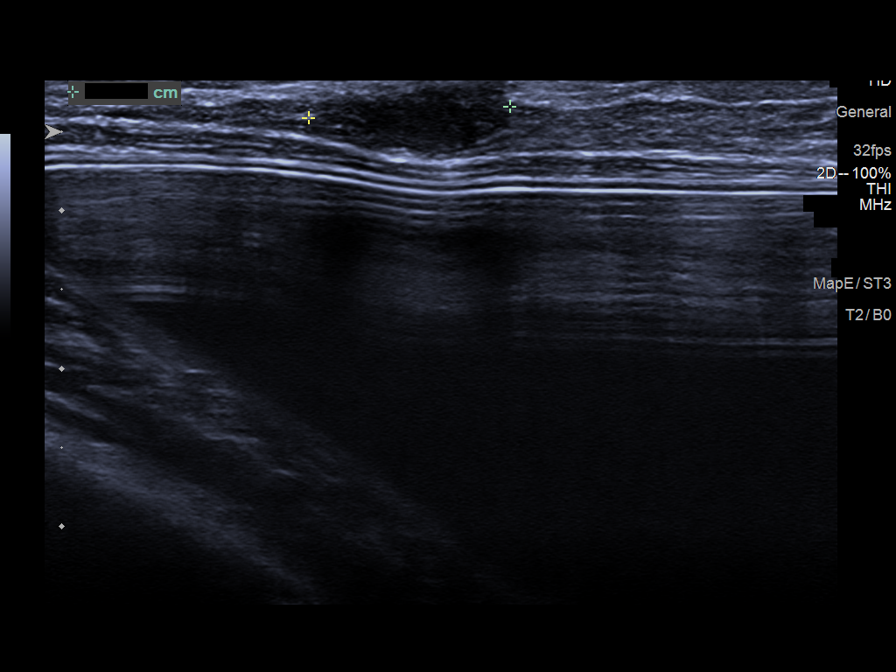

[9 of 9 positions shown; findings below may reference images not displayed]

FINDINGS: On physical exam, there is a mobile smooth palpable lump in the
upper-outer quadrant of the left breast.

Targeted ultrasound is performed, showing a circumscribed oval
hypoechoic mass at 2 o'clock, 5 cm from the nipple demonstrates
measuring 1.3 x 0.5 x 0.8 cm.
IMPRESSION: The mass in the left breast at 2 o'clock is likely benign, favored
to represent a fibroadenoma.

RECOMMENDATION:
Six-month follow-up left breast ultrasound.

I have discussed the findings and recommendations with the patient.
If applicable, a reminder letter will be sent to the patient
regarding the next appointment.

BI-RADS CATEGORY  3: Probably benign.

## 2021-07-31 ENCOUNTER — Other Ambulatory Visit: Payer: Self-pay | Admitting: Family Medicine

## 2021-07-31 DIAGNOSIS — Z3041 Encounter for surveillance of contraceptive pills: Secondary | ICD-10-CM

## 2021-08-29 IMAGING — DX DG WRIST COMPLETE 3+V*R*
3 series · 3 of 3 positions shown · non-contrast
Comparison: None.

CLINICAL DATA: Right wrist pain for several months

EXAM:
RIGHT WRIST - COMPLETE 3+ VIEW

[wrist pa]
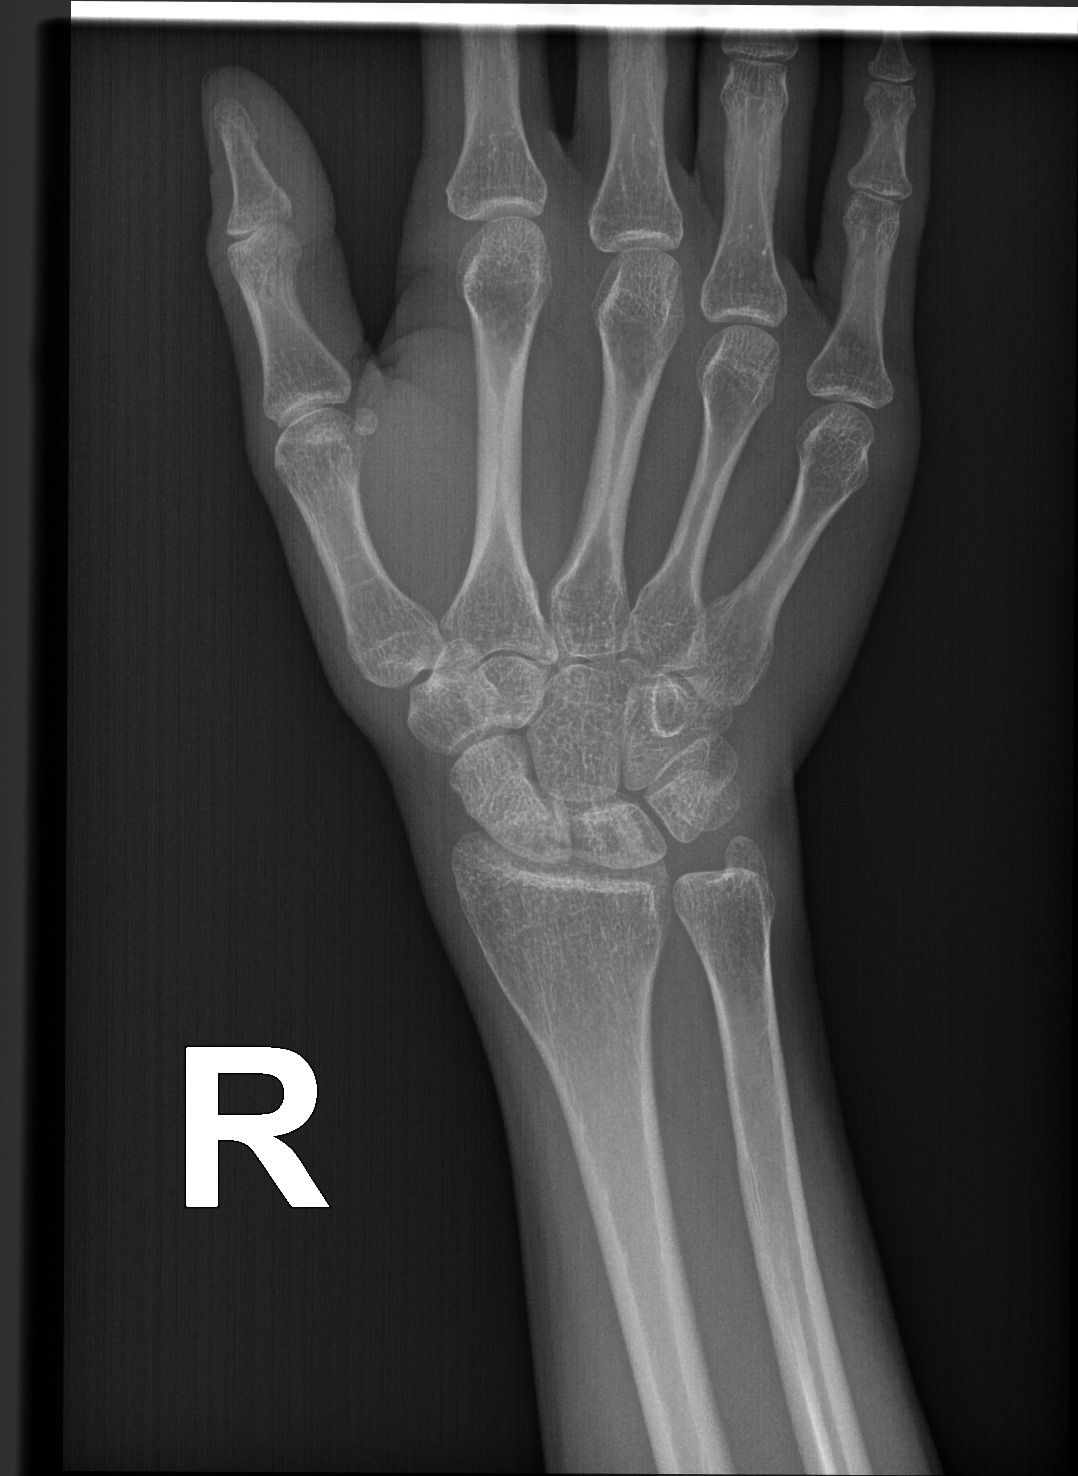

[wrist lat]
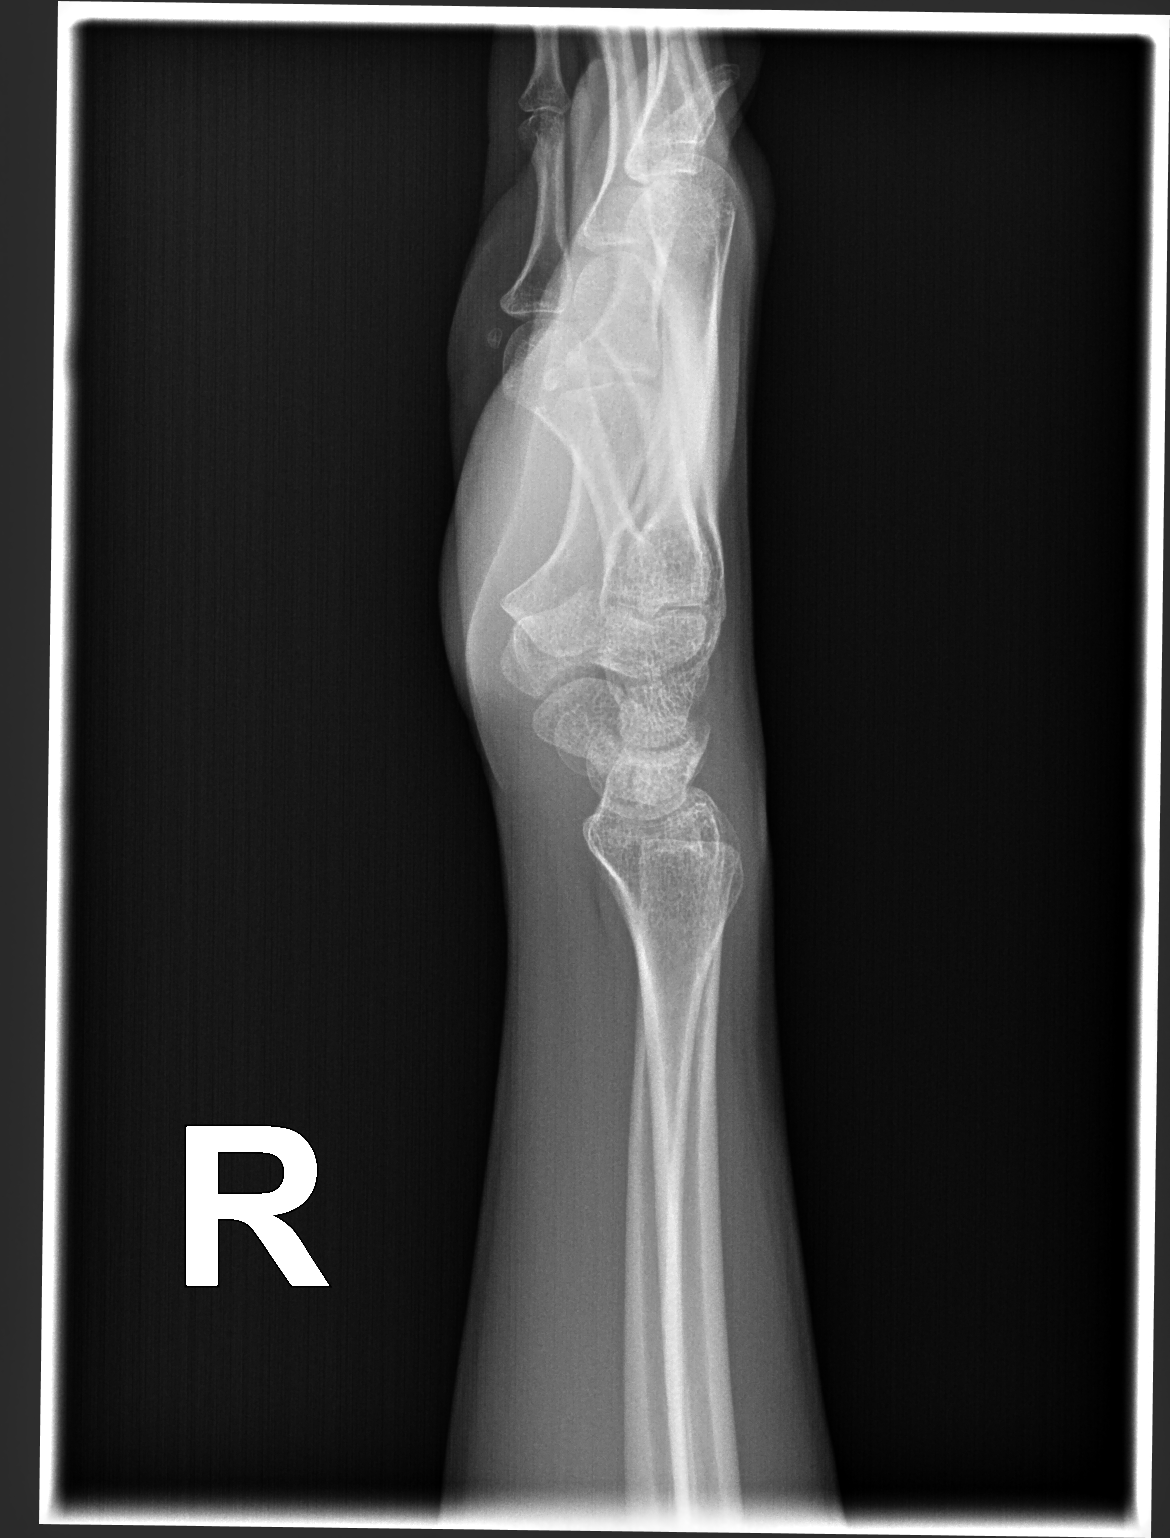

[wrist obl (oblique)]
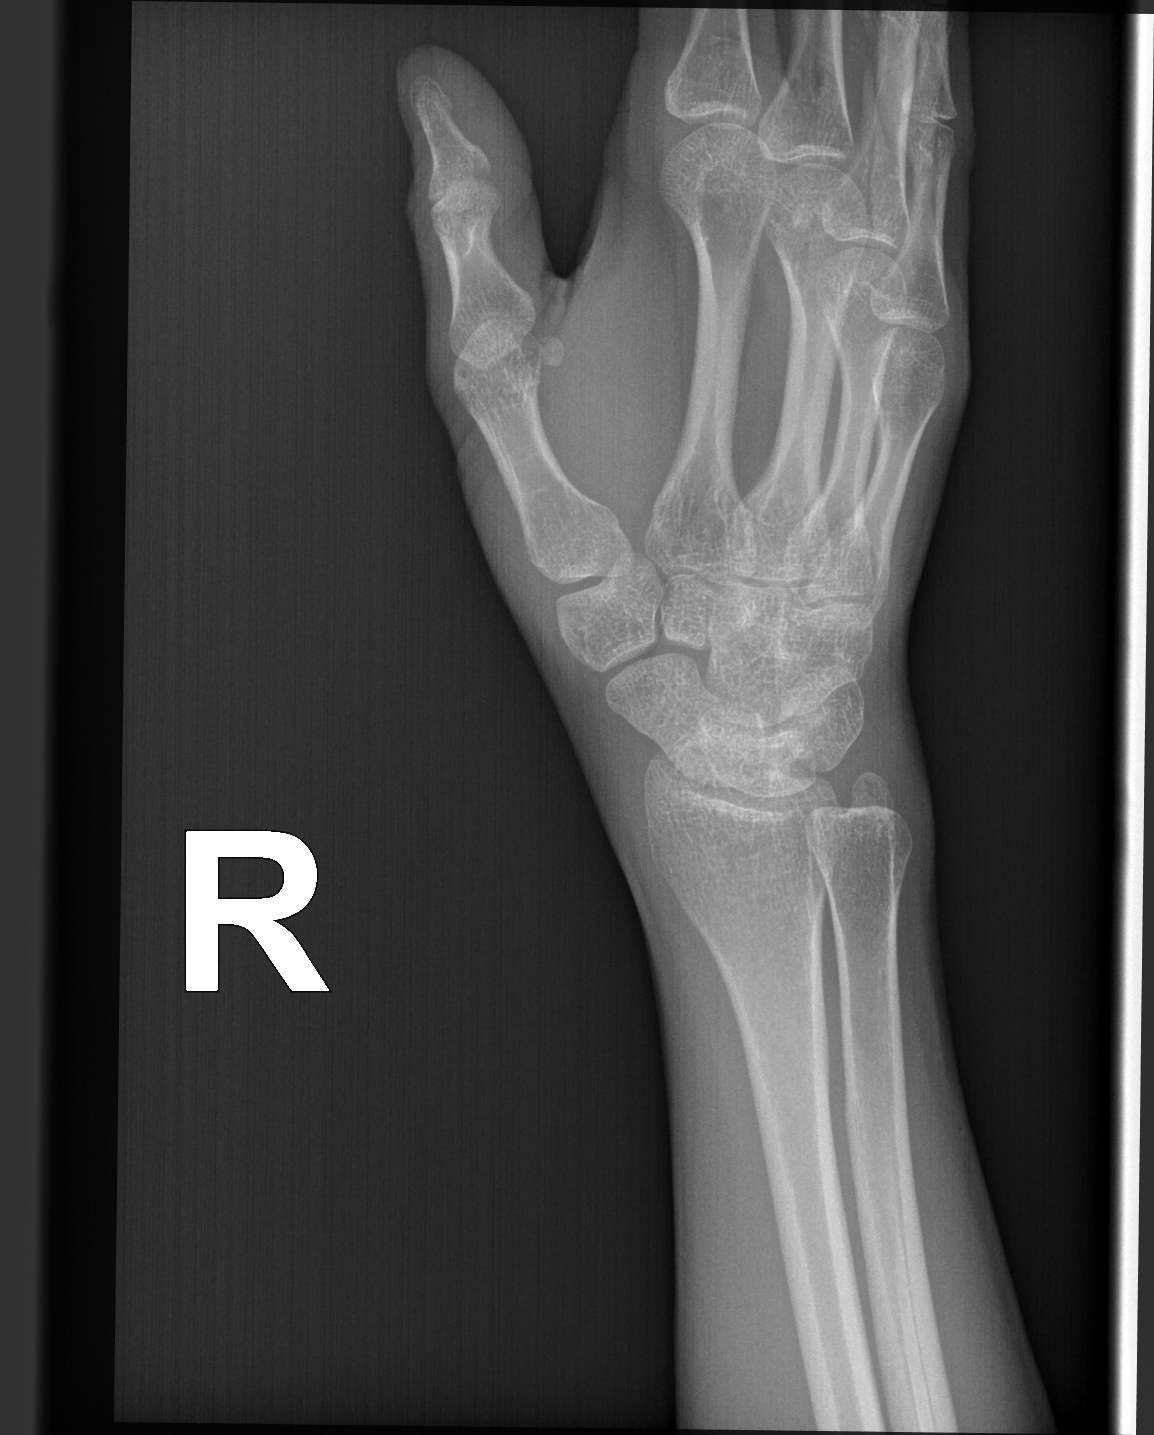

[3 of 3 positions shown; findings below may reference images not displayed]

FINDINGS: There is no evidence of fracture or dislocation. There is no
evidence of arthropathy or other focal bone abnormality. Soft
tissues are unremarkable.
IMPRESSION: No fracture or dislocation of the right wrist. The carpus is
normally aligned.

## 2021-09-17 ENCOUNTER — Encounter: Payer: Self-pay | Admitting: Family Medicine

## 2021-09-17 ENCOUNTER — Other Ambulatory Visit: Payer: Self-pay

## 2021-09-17 ENCOUNTER — Ambulatory Visit (INDEPENDENT_AMBULATORY_CARE_PROVIDER_SITE_OTHER): Payer: 59 | Admitting: Family Medicine

## 2021-09-17 DIAGNOSIS — F32A Depression, unspecified: Secondary | ICD-10-CM | POA: Diagnosis not present

## 2021-09-17 DIAGNOSIS — F419 Anxiety disorder, unspecified: Secondary | ICD-10-CM

## 2021-09-17 DIAGNOSIS — M25531 Pain in right wrist: Secondary | ICD-10-CM | POA: Diagnosis not present

## 2021-09-17 DIAGNOSIS — R635 Abnormal weight gain: Secondary | ICD-10-CM | POA: Diagnosis not present

## 2021-09-17 NOTE — Assessment & Plan Note (Signed)
Continues to trend down.  Suspect lots of the weight gain was related to her prior anxiety medication though inactivity and diet likely played a role as well.  Discussed healthier diet and increasing her activity level. ?

## 2021-09-17 NOTE — Assessment & Plan Note (Signed)
Suspect de Quervain's tenosynovitis though she has had persistent pain despite multiple different kinds of treatment.  She will contact orthopedics to see them for follow-up.  If she is not able to get in to see them in the near future she will let me know and I can proceed with an MRI of her wrist prior to her seeing them. ?

## 2021-09-17 NOTE — Assessment & Plan Note (Signed)
Adequately controlled.  She will continue Lexapro 10 mg once daily.  She will follow-up in 1 year. ?

## 2021-09-17 NOTE — Patient Instructions (Signed)
Nice to see you. ?Please try to increase your exercise and reduce eating out. ?Please call orthopedics to schedule an evaluation for your right wrist. ?Please call GYN to schedule your Pap smear as well. ?

## 2021-09-17 NOTE — Progress Notes (Signed)
?Tommi Rumps, MD ?Phone: [phone] ? ?ADNA WARHURST is a 31 y.o. female who presents today for f/u. ? ?Anxiety/depression: Minimal anxiety.  No depression.  Lexapro has been beneficial.  No noted side effects.  No SI. ? ?Right wrist pain: She continues to have issues with radial right wrist pain.  She gets a sharp pain intermittently when she moves her wrist.  She notes icing did not help.  She has worn multiple braces with no benefit.  She saw orthopedics and had a steroid injection which was not beneficial.  She noted orthopedics advised that she would need an MRI as the next step. ? ?Weight gain: Patient's weight has trended down some with the change in anxiety medication.  She has not been exercising but plans to start walking the dogs.  She eats a lot of restaurant food.   ? ?Social History  ? ?Tobacco Use  ?Smoking Status Former  ? Packs/day: 0.50  ? Types: Cigarettes  ? Quit date: 07/25/2018  ? Years since quitting: 3.1  ?Smokeless Tobacco Never  ?Tobacco Comments  ? quit 07/2018  ? ? ?Current Outpatient Medications on File Prior to Visit  ?Medication Sig Dispense Refill  ? escitalopram (LEXAPRO) 10 MG tablet TAKE 1 TABLET BY MOUTH EVERY DAY 90 tablet 2  ? norgestimate-ethinyl estradiol (ORTHO-CYCLEN) 0.25-35 MG-MCG tablet TAKE 1 TABLET BY MOUTH EVERY DAY 84 tablet 3  ? ?No current facility-administered medications on file prior to visit.  ? ? ? ?ROS see history of present illness ? ?Objective ? ?Physical Exam ?Vitals:  ? 09/17/21 0909  ?BP: 100/60  ?Pulse: 73  ?Temp: 99.3 ?F (37.4 ?C)  ?SpO2: 99%  ? ? ?BP Readings from Last 3 Encounters:  ?09/17/21 100/60  ?03/19/21 110/78  ?12/04/20 120/82  ? ?Wt Readings from Last 3 Encounters:  ?09/17/21 141 lb (64 kg)  ?03/19/21 144 lb 12.8 oz (65.7 kg)  ?12/04/20 147 lb 6.4 oz (66.9 kg)  ? ? ?Physical Exam ?Constitutional:   ?   General: She is not in acute distress. ?   Appearance: She is not diaphoretic.  ?Cardiovascular:  ?   Rate and Rhythm: Normal rate and  regular rhythm.  ?   Heart sounds: Normal heart sounds.  ?Pulmonary:  ?   Effort: Pulmonary effort is normal.  ?   Breath sounds: Normal breath sounds.  ?Musculoskeletal:  ?   Comments: Right wrist with no tenderness, no anatomic snuffbox tenderness, positive Finkelstein's on the right  ?Skin: ?   General: Skin is warm and dry.  ?Neurological:  ?   Mental Status: She is alert.  ? ? ? ?Assessment/Plan: Please see individual problem list. ? ?Problem List Items Addressed This Visit   ? ? Anxiety and depression (Chronic)  ?  Adequately controlled.  She will continue Lexapro 10 mg once daily.  She will follow-up in 1 year. ?  ?  ? Right wrist pain (Chronic)  ?  Suspect de Quervain's tenosynovitis though she has had persistent pain despite multiple different kinds of treatment.  She will contact orthopedics to see them for follow-up.  If she is not able to get in to see them in the near future she will let me know and I can proceed with an MRI of her wrist prior to her seeing them. ?  ?  ? Weight gain  ?  Continues to trend down.  Suspect lots of the weight gain was related to her prior anxiety medication though inactivity and diet likely played  a role as well.  Discussed healthier diet and increasing her activity level. ?  ?  ? ? ? ?Health Maintenance: The patient will call GYN to schedule her Pap smear. ? ?Return in about 1 year (around 09/18/2022) for Anxiety follow-up. ? ?This visit occurred during the SARS-CoV-2 public health emergency.  Safety protocols were in place, including screening questions prior to the visit, additional usage of staff PPE, and extensive cleaning of exam room while observing appropriate contact time as indicated for disinfecting solutions.  ? ? ?Tommi Rumps, MD ?[place] ? ?

## 2022-02-17 ENCOUNTER — Other Ambulatory Visit: Payer: Self-pay | Admitting: Family Medicine

## 2022-09-18 ENCOUNTER — Ambulatory Visit (INDEPENDENT_AMBULATORY_CARE_PROVIDER_SITE_OTHER): Payer: Self-pay | Admitting: Family Medicine

## 2022-09-18 ENCOUNTER — Encounter: Payer: Self-pay | Admitting: Family Medicine

## 2022-09-18 VITALS — BP 116/72 | HR 106 | Temp 98.6°F | Ht 64.0 in | Wt 128.4 lb

## 2022-09-18 DIAGNOSIS — R635 Abnormal weight gain: Secondary | ICD-10-CM

## 2022-09-18 DIAGNOSIS — F32A Depression, unspecified: Secondary | ICD-10-CM

## 2022-09-18 DIAGNOSIS — F419 Anxiety disorder, unspecified: Secondary | ICD-10-CM

## 2022-09-18 DIAGNOSIS — Z3041 Encounter for surveillance of contraceptive pills: Secondary | ICD-10-CM

## 2022-09-18 DIAGNOSIS — N6310 Unspecified lump in the right breast, unspecified quadrant: Secondary | ICD-10-CM

## 2022-09-18 MED ORDER — NORGESTIMATE-ETH ESTRADIOL 0.25-35 MG-MCG PO TABS: 1.0000 | ORAL_TABLET | Freq: Every day | ORAL | 3 refills | Status: DC

## 2022-09-18 NOTE — Progress Notes (Addendum)
Eileen Rumps, MD Phone: [phone]  Eileen Hart is a 32 y.o. female who presents today for follow-up.  Anxiety/depression: Patient notes her anxiety is adequately controlled.  She notes no depression.  She continues on Lexapro.  No SI.  Contraception: Patient continues on an OCP.  She has a menstrual cycle once monthly lasting 4 to 5 days.  She notes no intermenstrual bleeding.  No smoking.  Right breast lump: Patient noted this 2 weeks ago.  There is no pain though it is a little sensitive if she touches the area.  No nipple discharge or skin changes.  Patient notes she started working out with a Physiological scientist 2 times a week and is doing weights and core exercises.  She notes she cut out fast food and is cooking more at home.  She has lost some weight with these changes.  Social History   Tobacco Use  Smoking Status Former   Packs/day: .5   Types: Cigarettes   Quit date: 07/25/2018   Years since quitting: 4.1  Smokeless Tobacco Never  Tobacco Comments   quit 07/2018    Current Outpatient Medications on File Prior to Visit  Medication Sig Dispense Refill   escitalopram (LEXAPRO) 10 MG tablet TAKE 1 TABLET BY MOUTH EVERY DAY 90 tablet 2   No current facility-administered medications on file prior to visit.     ROS see history of present illness  Objective  Physical Exam Vitals:   09/18/22 0823  BP: 116/72  Pulse: (!) 106  Temp: 98.6 F (37 C)  SpO2: 99%    BP Readings from Last 3 Encounters:  09/18/22 116/72  09/17/21 100/60  03/19/21 110/78   Wt Readings from Last 3 Encounters:  09/18/22 128 lb 6.4 oz (58.2 kg)  09/17/21 141 lb (64 kg)  03/19/21 144 lb 12.8 oz (65.7 kg)    Physical Exam Constitutional:      General: She is not in acute distress.    Appearance: She is not diaphoretic.  Cardiovascular:     Rate and Rhythm: Normal rate and regular rhythm.     Heart sounds: Normal heart sounds.  Pulmonary:     Effort: Pulmonary effort is  normal.     Breath sounds: Normal breath sounds.  Chest:       Comments: Other than as outlined there were no other breast masses, tenderness, nipple inversion, or skin changes in either breast, no axillary masses bilaterally Skin:    General: Skin is warm and dry.  Neurological:     Mental Status: She is alert.      Assessment/Plan: Please see individual problem list.  Anxiety and depression Assessment & Plan: Chronic issue.  Adequately controlled.  Patient would like to stay on Lexapro 10 mg daily.  Will follow-up in 1 year.   Encounter for surveillance of contraceptive pills Assessment & Plan: Chronic issue.  Patient has been stable on Ortho-Cyclen 1 tablet daily.  This will be refilled.  Orders: -     Norgestimate-Eth Estradiol; Take 1 tablet by mouth daily.  Dispense: 84 tablet; Refill: 3  Weight gain Assessment & Plan: Patient has changed diet and exercise and has lost weight.  She will continue with her changes.   Mass of right breast, unspecified quadrant Assessment & Plan: Exam completed today.  Mammogram and ultrasound ordered.  Orders: -     MM 3D DIAGNOSTIC MAMMOGRAM BILATERAL BREAST; Future -     [place] LIMITED ULTRASOUND INCLUDING AXILLA LEFT BREAST ; Future -  [place] LIMITED ULTRASOUND INCLUDING AXILLA RIGHT BREAST; Future     Health Maintenance: patient was encouraged to schedule a physical with me or GYN for her pap smear.  Return in about 3 months (around 12/19/2022) for physical.   Eileen Rumps, MD [place]

## 2022-09-18 NOTE — Assessment & Plan Note (Signed)
Chronic issue.  Adequately controlled.  Patient would like to stay on Lexapro 10 mg daily.  Will follow-up in 1 year.

## 2022-09-18 NOTE — Assessment & Plan Note (Signed)
Patient has changed diet and exercise and has lost weight.  She will continue with her changes.

## 2022-09-18 NOTE — Assessment & Plan Note (Signed)
Exam completed today.  Mammogram and ultrasound ordered.

## 2022-09-18 NOTE — Addendum Note (Signed)
Addended by: Leone Haven on: 09/18/2022 10:24 AM   Modules accepted: Orders

## 2022-09-18 NOTE — Assessment & Plan Note (Addendum)
Chronic issue.  Patient has been stable on Ortho-Cyclen 1 tablet daily.  This will be refilled.

## 2022-09-18 NOTE — Patient Instructions (Signed)
Nice to see you. Somebody should call you to schedule your mammogram.  If you do not hear from them within 2 weeks please let us know. Please schedule physical with me or gynecology in the next few months to have your Pap smear completed.

## 2022-10-07 ENCOUNTER — Ambulatory Visit
Admission: RE | Admit: 2022-10-07 | Discharge: 2022-10-07 | Disposition: A | Discharge status: Home / Self Care | Payer: Medicaid Other | Source: Ambulatory Visit | Attending: Family Medicine | Admitting: Family Medicine

## 2022-10-07 ENCOUNTER — Other Ambulatory Visit: Payer: Self-pay

## 2022-10-07 ENCOUNTER — Other Ambulatory Visit: Payer: Self-pay | Admitting: Family Medicine

## 2022-10-07 DIAGNOSIS — Z3041 Encounter for surveillance of contraceptive pills: Secondary | ICD-10-CM

## 2022-10-07 DIAGNOSIS — N6310 Unspecified lump in the right breast, unspecified quadrant: Secondary | ICD-10-CM

## 2022-10-07 DIAGNOSIS — N6311 Unspecified lump in the right breast, upper outer quadrant: Secondary | ICD-10-CM | POA: Diagnosis not present

## 2022-10-07 DIAGNOSIS — F419 Anxiety disorder, unspecified: Secondary | ICD-10-CM

## 2022-10-07 DIAGNOSIS — R635 Abnormal weight gain: Secondary | ICD-10-CM

## 2022-10-08 ENCOUNTER — Other Ambulatory Visit: Payer: Self-pay | Admitting: Family Medicine

## 2022-10-08 DIAGNOSIS — R928 Other abnormal and inconclusive findings on diagnostic imaging of breast: Secondary | ICD-10-CM

## 2022-10-08 DIAGNOSIS — N63 Unspecified lump in unspecified breast: Secondary | ICD-10-CM

## 2022-10-14 ENCOUNTER — Ambulatory Visit
Admission: RE | Admit: 2022-10-14 | Discharge: 2022-10-14 | Disposition: A | Discharge status: Home / Self Care | Payer: Medicaid Other | Source: Ambulatory Visit | Attending: Family Medicine | Admitting: Family Medicine

## 2022-10-14 DIAGNOSIS — N6311 Unspecified lump in the right breast, upper outer quadrant: Secondary | ICD-10-CM | POA: Insufficient documentation

## 2022-10-14 DIAGNOSIS — N63 Unspecified lump in unspecified breast: Secondary | ICD-10-CM | POA: Insufficient documentation

## 2022-10-14 DIAGNOSIS — R928 Other abnormal and inconclusive findings on diagnostic imaging of breast: Secondary | ICD-10-CM

## 2022-10-14 HISTORY — PX: BREAST BIOPSY: SHX20

## 2022-10-14 MED ORDER — LIDOCAINE HCL (PF) 1 % IJ SOLN
2.0000 mL | Freq: Once | INTRAMUSCULAR | Status: AC
  Administered 2022-10-14: 2 mL via INTRADERMAL

## 2022-10-14 MED ORDER — LIDOCAINE-EPINEPHRINE 1 %-1:100000 IJ SOLN
8.0000 mL | Freq: Once | INTRAMUSCULAR | Status: AC
  Administered 2022-10-14: 8 mL

## 2022-10-17 LAB — SURGICAL PATHOLOGY

## 2022-10-18 ENCOUNTER — Other Ambulatory Visit: Payer: Self-pay | Admitting: Anatomic Pathology & Clinical Pathology

## 2022-10-18 ENCOUNTER — Telehealth: Payer: Self-pay | Admitting: Family Medicine

## 2022-10-18 ENCOUNTER — Encounter: Payer: Self-pay | Admitting: *Deleted

## 2022-10-18 DIAGNOSIS — C50919 Malignant neoplasm of unspecified site of unspecified female breast: Secondary | ICD-10-CM

## 2022-10-18 NOTE — Telephone Encounter (Signed)
I attempted to contact the patient after receiving cosign orders for oncology and general surgery after her recent biopsy results came back.  I had to leave a message asking the patient to call back to the office.  I will await her call back.  It appears she has already been scheduled with general surgery and oncology as well as genetics for her recently diagnosed breast cancer.

## 2022-10-18 NOTE — Telephone Encounter (Signed)
Patient returned office phone call. She will keep her phone with her and will wait for Dr Purvis Sheffield phone call, [phone].

## 2022-10-18 NOTE — Telephone Encounter (Signed)
Spoke with patient.  She noted she had no questions for me.  She confirms she knew when her appointments were with the surgeon and oncologist.  I advised if she had any questions or needed anything from [place] she could contact us in the future.

## 2022-10-18 NOTE — Progress Notes (Signed)
Received referral for newly diagnosed breast cancer from [facility].  Navigation initiated.  Referral sent to [facility].  She will see Dr. Smith Robert on Tuesday at 10:45.

## 2022-10-21 ENCOUNTER — Encounter: Payer: Self-pay | Admitting: Surgery

## 2022-10-21 ENCOUNTER — Ambulatory Visit (INDEPENDENT_AMBULATORY_CARE_PROVIDER_SITE_OTHER): Payer: Self-pay | Admitting: Surgery

## 2022-10-21 VITALS — BP 135/84 | HR 84 | Temp 98.9°F | Ht 64.0 in | Wt 123.8 lb

## 2022-10-21 DIAGNOSIS — Z171 Estrogen receptor negative status [ER-]: Secondary | ICD-10-CM

## 2022-10-21 DIAGNOSIS — R9230 Dense breasts, unspecified: Secondary | ICD-10-CM

## 2022-10-21 DIAGNOSIS — C50411 Malignant neoplasm of upper-outer quadrant of right female breast: Secondary | ICD-10-CM

## 2022-10-21 NOTE — Progress Notes (Unsigned)
Hematology/Oncology Consult note [facility] [phone] [phone]  Patient Care Team: Glori Luis, MD as PCP - General (Family Medicine) Hulen Luster, RN as Oncology Nurse Navigator   Name of the patient: Eileen Hart  [id number]  1990-07-16    Reason for referral-new diagnosis of breast cancer   Referring physician-Dr. Birdie Sons  Date of visit: 10/21/22   History of presenting illness- Patient is a 32 year old female who underwent a bilateral diagnostic mammogram on 10/07/2022 after she felt a palpable area of concern in her right breast.  Mammogram showed a 1.6 x 1 x 1.5 cm solid mass in the right breast 9:30 position 5 cm from the nipple.  There was also an adjacent 6 x 9 x 2 mm mass in the right breast.  No right axillary adenopathy.  The dominant mass was biopsied and was consistent with invasive mammary carcinoma grade 3 ER and HER2 negative.Patient has met with Dr. Elenor Legato and bilateral breast MRI has been ordered.  Menarche at the age of 12.  She is G2, P1.  Family history significant for breast cancer in her second maternal cousin at the age of 74.  ECOG PS- ***  Pain scale- ***   Review of systems- ROS  No Known Allergies  Patient Active Problem List   Diagnosis Date Noted   Lump of right breast 09/18/2022   Weight gain 12/04/2020   Right wrist pain 09/27/2019   Contraceptive management 07/07/2019   Anxiety and depression 12/29/2015     Past Medical History:  Diagnosis Date   Chlamydia 01/25/2010   Depression with anxiety    Gestational hypertension    Immunization, viral disease    gardasil series completed   Tobacco abuse 05/02/2016     Past Surgical History:  Procedure Laterality Date   AUGMENTATION MAMMAPLASTY Bilateral    saline implants 2015   BREAST BIOPSY Right 10/14/2022   US biopsy/ coil clip/ path pending   BREAST BIOPSY Right 10/14/2022   [place] RT BREAST BX W LOC DEV 1ST LESION IMG BX SPEC  US GUIDE 10/14/2022 [facility]   BREAST ENHANCEMENT SURGERY  2015   CESAREAN SECTION  2014   TONSILLECTOMY      Social History   Socioeconomic History   Marital status: Single    Spouse name: Not on file   Number of children: 1   Years of education: 15   Highest education level: Not on file  Occupational History   Occupation: Groomer    Comment: [employer]  Tobacco Use   Smoking status: Former    Packs/day: .5    Types: Cigarettes    Quit date: 07/25/2018    Years since quitting: 4.2   Smokeless tobacco: Never   Tobacco comments:    quit 07/2018  Vaping Use   Vaping Use: Never used  Substance and Sexual Activity   Alcohol use: No    Alcohol/week: 0.0 standard drinks of alcohol   Drug use: No   Sexual activity: Yes    Birth control/protection: Pill  Other Topics Concern   Not on file  Social History Narrative   Not on file   Social Determinants of Health   Financial Resource Strain: Not on file  Food Insecurity: Not on file  Transportation Needs: Not on file  Physical Activity: Not on file  Stress: Not on file  Social Connections: Not on file  Intimate Partner Violence: Not on file     Family History  Problem Relation  Age of Onset   Breast cancer Other 35       mat 2nd cousin   Diabetes Neg Hx    Heart disease Neg Hx    Hypertension Neg Hx    Ovarian cancer Neg Hx    Colon cancer Neg Hx      Current Outpatient Medications:    escitalopram (LEXAPRO) 10 MG tablet, TAKE 1 TABLET BY MOUTH EVERY DAY, Disp: 90 tablet, Rfl: 2   norgestimate-ethinyl estradiol (ORTHO-CYCLEN) 0.25-35 MG-MCG tablet, Take 1 tablet by mouth daily., Disp: 84 tablet, Rfl: 3   Physical exam: There were no vitals filed for this visit. Physical Exam        Latest Ref Rng & Units 12/04/2020    9:07 AM  CMP  Glucose 70 - 99 mg/dL 80   BUN 6 - 23 mg/dL 8   Creatinine 1.61 - 0.96 mg/dL 0.45   Sodium 409 - 811 mEq/L 139   Potassium 3.5 - 5.1 mEq/L 4.3   Chloride 96 -  112 mEq/L 105   CO2 19 - 32 mEq/L 25   Calcium 8.4 - 10.5 mg/dL 9.2   Total Protein 6.0 - 8.3 g/dL 7.2   Total Bilirubin 0.2 - 1.2 mg/dL 0.4   Alkaline Phos 39 - 117 U/L 70   AST 0 - 37 U/L 15   ALT 0 - 35 U/L 14       Latest Ref Rng & Units 03/15/2013   11:02 AM  CBC  WBC 3.6 - 11.0 x10 3/mm 3 17.8   Hemoglobin 12.0 - 16.0 g/dL 9.5   Hematocrit 91.4 - 47.0 % 28.2   Platelets 150 - 440 x10 3/mm 3 257     No images are attached to the encounter.  [place] RT BREAST BX W LOC DEV 1ST LESION IMG BX SPEC US GUIDE  Addendum Date: 10/18/2022   ADDENDUM REPORT: 10/18/2022 12:11 ADDENDUM: PATHOLOGY revealed: BREAST, RIGHT AT 9:30 O'CLOCK, 5 CM FROM THE NIPPLE; ULTRASOUND-GUIDED CORE NEEDLE BIOPSY: - INVASIVE MAMMARY CARCINOMA, NO SPECIAL TYPE. 5 mm in this sample. Grade 3. Ductal carcinoma in situ: Not identified. Lymphovascular invasion: Not identified. Comment: Immunohistochemical studies were performed. Tumor cells are positive for GATA3, and negative for neuroendocrine markers chromogranin and synaptophysin. These findings support the above diagnosis. Pathology results are CONCORDANT with imaging findings, per Dr. Annia Belt. Pathology results and recommendations were discussed with patient via telephone on 10/17/2022. Patient reported biopsy site doing well with no adverse symptoms, and only slight tenderness at the site. Post biopsy care instructions were reviewed, questions were answered and my direct phone number was provided. Patient was instructed to call [facility] for any additional questions or concerns related to biopsy site. RECOMMENDATIONS: 1. Please schedule patient for additional RIGHT breast ULTRASOUND-GUIDED CORE NEEDLE BIOPSY for a probably benign oval hypoechoic mass right breast 9:30 o'clock 5 cm from the nipple measuring up to 0.9 cm. 2. Surgical and oncological consultation. Request for surgical and oncological consultation relayed to Irving Shows RN at [facility] by Randa Lynn RN on 10/17/2022. Pathology results reported by Randa Lynn RN on 10/17/2022. Electronically Signed   By: Annia Belt M.D.   On: 10/18/2022 12:11   Result Date: 10/18/2022 CLINICAL DATA:  Indeterminate palpable right breast mass. EXAM: ULTRASOUND GUIDED RIGHT BREAST CORE NEEDLE BIOPSY COMPARISON:  Previous exam(s). PROCEDURE: I met with the patient and we discussed the procedure of ultrasound-guided biopsy, including benefits and alternatives. We discussed the high likelihood  of a successful procedure. We discussed the risks of the procedure, including infection, bleeding, tissue injury, clip migration, and inadequate sampling. Informed written consent was given. The usual time-out protocol was performed immediately prior to the procedure. Lesion quadrant: Upper outer quadrant Using sterile technique and 1% Lidocaine as local anesthetic, under direct ultrasound visualization, a 14 gauge spring-loaded device was used to perform biopsy of right breast mass 9:30 o'clock using a lateral approach. At the conclusion of the procedure coil shaped tissue marker clip was deployed into the biopsy cavity. Follow up 2 view mammogram was performed and dictated separately. IMPRESSION: Ultrasound guided biopsy of right breast mass 9:30 o'clock. No apparent complications. Electronically Signed: By: Annia Belt M.D. On: 10/14/2022 09:08   MM CLIP PLACEMENT RIGHT  Result Date: 10/14/2022 CLINICAL DATA:  Status post ultrasound biopsy right breast mass EXAM: 3D DIAGNOSTIC RIGHT MAMMOGRAM POST ULTRASOUND BIOPSY COMPARISON:  Previous exam(s). FINDINGS: 3D Mammographic images were obtained following ultrasound guided biopsy of right breast mass 9:30 o'clock. The biopsy marking clip is in expected position at the site of biopsy. IMPRESSION: Appropriate positioning of the coil shaped biopsy marking clip at the site of biopsy in the right breast mass 9:30 o'clock. Final Assessment: Post Procedure Mammograms for  Marker Placement Electronically Signed   By: Annia Belt M.D.   On: 10/14/2022 09:02  [place] LIMITED ULTRASOUND INCLUDING AXILLA RIGHT BREAST  Result Date: 10/07/2022 CLINICAL DATA:  Patient presents for palpable area of concern within the outer right breast. EXAM: DIGITAL DIAGNOSTIC BILATERAL MAMMOGRAM WITH IMPLANTS, TOMOSYNTHESIS; ULTRASOUND RIGHT BREAST LIMITED TECHNIQUE: Bilateral digital diagnostic mammography and breast tomosynthesis was performed. Standard and/or implant displaced views were performed.; Targeted ultrasound examination of the right breast was performed COMPARISON:  None ACR Breast Density Category c: The breasts are heterogeneously dense, which may obscure small masses. FINDINGS: There is a partially obscured mass underlying the palpable marker within the outer right breast. No additional masses, calcifications or nonsurgical distortion identified within either breast. The patient has retropectoral implants. On physical exam, there is a small palpable mass within the outer right breast. Targeted ultrasound is performed, showing a 1.6 x 1.0 x 1.5 cm heterogeneous lobular solid mass right breast 9:30 o'clock 5 cm from the nipple. There is an adjacent 6 x 9 x 2 mm hypoechoic mass right breast 9:30 o'clock 5 cm from the nipple. No right axillary adenopathy. IMPRESSION: 1. Indeterminate palpable right breast mass 9:30 o'clock 5 cm from the nipple measuring up to 1.6 cm. 2. Additional probably benign oval hypoechoic mass right breast 9:30 o'clock 5 cm from the nipple measuring up to 0.9 cm. RECOMMENDATION: Ultrasound-guided core needle biopsy of the palpable right breast mass 9:30 o'clock 5 cm from the nipple measuring up to 1.6 cm. If this demonstrates benign pathology, six-month follow-up right breast ultrasound is recommended to reassess the adjacent probably benign right breast mass. At the palpable mass is malignant or surgical in etiology, biopsy of the additional adjacent mass is recommended.  I have discussed the findings and recommendations with the patient. If applicable, a reminder letter will be sent to the patient regarding the next appointment. BI-RADS CATEGORY  4: Suspicious. Electronically Signed   By: Annia Belt M.D.   On: 10/07/2022 15:18  MM 3D DIAGNOSTIC MAMMOGRAM BILATERAL BREAST W/IMPLANT  Result Date: 10/07/2022 CLINICAL DATA:  Patient presents for palpable area of concern within the outer right breast. EXAM: DIGITAL DIAGNOSTIC BILATERAL MAMMOGRAM WITH IMPLANTS, TOMOSYNTHESIS; ULTRASOUND RIGHT BREAST LIMITED TECHNIQUE: Bilateral digital diagnostic  mammography and breast tomosynthesis was performed. Standard and/or implant displaced views were performed.; Targeted ultrasound examination of the right breast was performed COMPARISON:  None ACR Breast Density Category c: The breasts are heterogeneously dense, which may obscure small masses. FINDINGS: There is a partially obscured mass underlying the palpable marker within the outer right breast. No additional masses, calcifications or nonsurgical distortion identified within either breast. The patient has retropectoral implants. On physical exam, there is a small palpable mass within the outer right breast. Targeted ultrasound is performed, showing a 1.6 x 1.0 x 1.5 cm heterogeneous lobular solid mass right breast 9:30 o'clock 5 cm from the nipple. There is an adjacent 6 x 9 x 2 mm hypoechoic mass right breast 9:30 o'clock 5 cm from the nipple. No right axillary adenopathy. IMPRESSION: 1. Indeterminate palpable right breast mass 9:30 o'clock 5 cm from the nipple measuring up to 1.6 cm. 2. Additional probably benign oval hypoechoic mass right breast 9:30 o'clock 5 cm from the nipple measuring up to 0.9 cm. RECOMMENDATION: Ultrasound-guided core needle biopsy of the palpable right breast mass 9:30 o'clock 5 cm from the nipple measuring up to 1.6 cm. If this demonstrates benign pathology, six-month follow-up right breast ultrasound is  recommended to reassess the adjacent probably benign right breast mass. At the palpable mass is malignant or surgical in etiology, biopsy of the additional adjacent mass is recommended. I have discussed the findings and recommendations with the patient. If applicable, a reminder letter will be sent to the patient regarding the next appointment. BI-RADS CATEGORY  4: Suspicious. Electronically Signed   By: Annia Belt M.D.   On: 10/07/2022 15:18   Assessment and plan- Patient is a 32 y.o. female with newly diagnosed triple negative breast cancer of the right breast cT1 cN0 M0 here to discuss further management  I have reviewed mammogram images independently and discussed findings with the patient.  Patient found to have a 1.6 cm solid mass in the right breast and an adjacent6 mm mass at the 9: 30 position 5 cm from the nipple.  No abnormal appearing right axillary lymph nodes.  Dominant mass was biopsied and was consistent with triple negative breast cancer.  I agree with proceeding with bilateral breast MRI given her young age and dense breasts to see if there is anything more than what meets the eye.  I would favor neoadjuvant chemotherapy for her on the regimen will be decided based on the extent of disease on MRI.  Discussed differences between hormone positive and hormone negative breast cancer and the rationale for chemotherapy use and triple negative breast cancer.  I will also discuss her case with Dr. Elenor Legato after her MRI is done.  If plan is to undergo a lumpectomy she will need a biopsy of the nondominant mass as well.  I will refer her to genetic counseling.  Given the absence of local regional adenopathy I am not scheduling any staging scans for her.  I will see her back for an in person visit after MRI results are back.  We will also obtain a baseline echocardiogram at this time.  I will refer her for genetic counseling   Thank you for this kind referral and the opportunity to participate in the  care of this  Patient   Visit Diagnosis No diagnosis found.  Dr. Owens Shark, MD, MPH [facility] [phone] 10/21/2022

## 2022-10-21 NOTE — Patient Instructions (Addendum)
If you have any concerns or questions, please feel free to call our office.   Breast Cancer, Female  Breast cancer is a malignant growth of tissue (tumor) in the breast. Unlike noncancerous (benign) tumors, malignant tumors are cancerous and can spread to other parts of the body. The two most common types of breast cancer start in the milk ducts (ductal carcinoma) or in the lobules where milk is made in the breast (lobular carcinoma). Breast cancer is one of the most common types of cancer in women. What are the causes? The exact cause of female breast cancer is unknown. What increases the risk? The following factors may make you more likely to develop this condition: Being older than 30 years of age. Having a family history of breast cancer. Starting menopause after age 55. Starting your menstrual periods before age 12. Having never been pregnant or having your first child after age 30. Having never breastfed. A personal history of: Breast cancer. Dense breast tissue. Radiation exposure. Having the BRCA1 and BRCA2 genes. Having certain types of benign breast conditions. Exposure to the drug DES, which was given to pregnant women from the 1940s to the 1970s. Other risks include: Using birth control pills. Using hormone therapy after menopause. Drinking more than one alcoholic drink a day. Obesity. What are the signs or symptoms? Symptoms of this condition include: A painless lump or thickening in your breast. Changes in the size or shape of your breast. Breast skin changes, such as puckering or dimpling. Nipple abnormalities, such as scaling, crustiness, redness, or pulling in (retraction). Nipple discharge that is bloody or clear. How is this diagnosed? This condition may be diagnosed by: Taking your medical history and doing a physical exam. During the exam, your health care provider will feel the tissue around your breast and under your arms. Taking a sample of nipple  discharge. The sample will be examined under a microscope. Performing imaging tests, such as breast X-rays (mammogram), ultrasound, or MRI. Taking a tissue sample (biopsy) from the breast. The sample will be examined under a microscope to look for cancer cells. Taking a sample from the lymph nodes near the affected breast (sentinel node biopsy). Your cancer will be staged to determine its severity and extent. Staging is a careful attempt to find out the size of the tumor, whether the cancer has spread, and if so, to what parts of the body. Staging also includes testing your tumor for certain receptors, such as estrogen, progesterone, and human epidermal growth factor receptor 2 (HER2). This will help your cancer care team decide on a treatment that will work best for you. You may need to have more tests to determine the stage of your cancer. Stages include the following: Stage 0--The tumor has not spread to other breast tissue. Stage 1 (I)--The cancer is only found in the breast or may be in the lymph nodes. The tumor may be up to  inch (2 cm) wide. Stage 2 (II)--The cancer has spread to nearby lymph nodes. The tumor may be up to 2 inches (5 cm) wide. Stage 3 (III)--The cancer has spread to more distant lymph nodes. The tumor may be larger than 2 inches (5 cm) wide. Stage 4 (IV)--The cancer has spread to other parts of the body, such as the bones, brain, liver, or lungs. How is this treated? Treatment for this condition depends on the type and stage of the breast cancer. It may be treated with: Surgery. This may involve breast-conserving surgery (lumpectomy or   partial mastectomy) in which only the part of the breast containing the cancer is removed. Some normal tissue surrounding this area may also be removed. In some cases, surgery may be done to remove the entire breast (mastectomy) and nipple. Lymph nodes may also be removed. Radiation therapy, which uses high-energy rays to kill cancer  cells. Chemotherapy, which is the use of medicines to kill cancer cells. Hormone therapy, which involves taking medicine to adjust the hormone levels in your body. You may take medicine to decrease your estrogen levels. This can help stop cancer cells from growing. Targeted therapy, in which medicines are used to block the growth and spread of cancer cells. These medicines target a specific part of the cancer cell and usually cause fewer side effects than chemotherapy. Targeted therapy may be used alone or in combination with chemotherapy. Immunotherapy, which is the use of medicines to boost the immune system to recognize and destroy cancer cells more effectively. A combination of surgery, radiation, chemotherapy, or hormone therapy may be needed to treat breast cancer. Follow these instructions at home: Take over-the-counter and prescription medicines only as told by your health care provider. Eat a healthy diet. A healthy diet includes lots of fruits and vegetables, low-fat dairy products, lean meats, and fiber. Make sure half your plate is filled with fruits or vegetables. Choose high-fiber foods such as whole-grain breads and cereals. Consider joining a support group. This may help you cope with the stress of having breast cancer. Talk to your health care team about exercise and physical activity. The right exercise program can: Help prevent or reduce symptoms such as fatigue or depression. Improve overall health and survival rates. Keep all follow-up visits. This is important. Where to find more information American Cancer Society: www.cancer.org National Cancer Institute: www.cancer.gov Contact a health care provider if: You have a sudden increase in pain. You have any symptoms or changes that concern you. You lose weight without trying. You notice a new lump in either breast or under your arm. You develop swelling in either arm or hand. You have a fever. You notice new fatigue or  weakness. Get help right away if: You have chest pain or trouble breathing. These symptoms may be an emergency. Get help right away. Call 911. Do not wait to see if the symptoms will go away. Do not drive yourself to the hospital. Summary Breast cancer is a malignant growth of tissue (tumor) in the breast. Your cancer will be staged to determine its severity and extent. Treatment for this condition depends on the type and stage of the breast cancer. This information is not intended to replace advice given to you by your health care provider. Make sure you discuss any questions you have with your health care provider. Document Revised: 04/30/2021 Document Reviewed: 04/30/2021 Elsevier Patient Education  2023 Elsevier Inc.  

## 2022-10-21 NOTE — H&P (View-Only) (Signed)
Patient ID: Eileen Hart, female   DOB: 07/30/1990, 32 y.o.   MRN: [id number]  HPI Eileen Hart is a 32 y.o. female seen in consultation at the request of Dr. Sonnenberg for an abnormal mammogram.  She reports that she felt a nodule in the right breast about 4 weeks ago.  The area is a little bit hypersensitive but no real pain.  It has not grown in size.  She denies any nipple discharge or any skin changes. She did have a history of breast augmentation with submuscular saline implants.  Did not have any complication related to breast surgery.  Is able to perform more than 4 METS of activity without any shortness of breath or chest pain.  She quit smoking more than 3 years ago but she still uses nicotine products.  Her mother had a history of first-degree causing with breast cancer that developed around age 30.  No other family members with cancer. Menarche at age 15.  Gravida 2 para 1M1.  She did have breast augmentation surgery right after breast-feeding. She Does take birth control pills She Did have a C-section 9 years ago. SHe is accompanied by her mother HPI  Past Medical History:  Diagnosis Date   Chlamydia 01/25/2010   Depression with anxiety    Gestational hypertension    Immunization, viral disease    gardasil series completed   Tobacco abuse 05/02/2016    Past Surgical History:  Procedure Laterality Date   AUGMENTATION MAMMAPLASTY Bilateral    saline implants 2015   BREAST BIOPSY Right 10/14/2022   us biopsy/ coil clip/ path pending   BREAST BIOPSY Right 10/14/2022   US RT BREAST BX W LOC DEV 1ST LESION IMG BX SPEC US GUIDE 10/14/2022 [facility]   BREAST ENHANCEMENT SURGERY  2015   CESAREAN SECTION  2014   TONSILLECTOMY      Family History  Problem Relation Age of Onset   Breast cancer Other 35       mat 2nd cousin   Diabetes Neg Hx    Heart disease Neg Hx    Hypertension Neg Hx    Ovarian cancer Neg Hx    Colon cancer Neg Hx     Social History Social  History   Tobacco Use   Smoking status: Former    Packs/day: .5    Types: Cigarettes    Quit date: 07/25/2018    Years since quitting: 4.2   Smokeless tobacco: Never   Tobacco comments:    quit 07/2018  Vaping Use   Vaping Use: Never used  Substance Use Topics   Alcohol use: No    Alcohol/week: 0.0 standard drinks of alcohol   Drug use: No    No Known Allergies  Current Outpatient Medications  Medication Sig Dispense Refill   escitalopram (LEXAPRO) 10 MG tablet TAKE 1 TABLET BY MOUTH EVERY DAY 90 tablet 2   norgestimate-ethinyl estradiol (ORTHO-CYCLEN) 0.25-35 MG-MCG tablet Take 1 tablet by mouth daily. 84 tablet 3   No current facility-administered medications for this visit.     Review of Systems Full ROS  was asked and was negative except for the information on the HPI  Physical Exam Blood pressure 135/84, pulse 84, temperature 98.9 F (37.2 C), temperature source Oral, height 5' 4" (1.626 m), weight 123 lb 12.8 oz (56.2 kg), last menstrual period 09/23/2022, SpO2 99 %. CONSTITUTIONAL: NAD. EYES: Pupils are equal, round, Sclera are non-icteric. EARS, NOSE, MOUTH AND THROAT: The oral mucosa is pink and   moist. Hearing is intact to voice. LYMPH NODES:  Lymph nodes in the neck are normal. RESPIRATORY:  Lungs are clear. There is normal respiratory effort, with equal breath sounds bilaterally, and without pathologic use of accessory muscles. CARDIOVASCULAR: Heart is regular without murmurs, gallops, or rubs. BREAST: Is evidence of a 1.5 cm nodule located in the right breast at 935 cm from the nipple.  It is mobile and seems to be superficial. The left breast at 8:00 there is some thickening also superficial difficult to exclude potential mass.  This tissue seems to be dense There is no evidence of axillary lymphadenopathy Evidence of prior breast augmentation surgery bilaterally GI: The abdomen is  soft, nontender, and nondistended. There are no palpable masses. There is no  hepatosplenomegaly. There are normal bowel sounds in all quadrants. GU: Rectal deferred.   MUSCULOSKELETAL: Normal muscle strength and tone. No cyanosis or edema.   SKIN: Turgor is good and there are no pathologic skin lesions or ulcers. NEUROLOGIC: Motor and sensation is grossly normal. Cranial nerves are grossly intact. PSYCH:  Oriented to person, place and time. Affect is normal.  Data Reviewed  I have personally reviewed the patient's imaging, laboratory findings and medical records.    Assessment/Plan 32 yo with triple negative Right breast invasive mammary carcinoma Upper outer quadrant .  Patient does have dense breast tissue and on the left breast I am able to feel some thickening within the inner lower quadrant.  Due to those reasons and the patient being 32 years old triple negative with dense breast tissue strongly believe that further imaging with bilateral MRI is indicated.  We will also make sure she follows with genetic counseling as this will help us determine surgical therapy.  I had an extensive discussion with the patient and the mother regarding breast cancer.  Regarding surgical therapy to include lumpectomy plus radiation therapy versus mastectomy.  We also briefly discussed reconstruction. She   Does have an appointment tomorrow with Dr. Rao for further biopsy of a probably benign mass within the right breast. Will like to get all the data before committing to any definitive surgical intervention.  Please note that I spent 60 minutes in this encounter including personally reviewing imaging studies, coordinating her care, placing orders and performing appropriate documentation Copy of this report was sent to the referring provider  Octavis Sheeler, MD FACS General Surgeon 10/21/2022, 3:15 PM   

## 2022-10-21 NOTE — Progress Notes (Signed)
Patient ID: Eileen Hart, female   DOB: 06-Apr-1991, 32 y.o.   MRN: [id number]  HPI Eileen Hart is a 32 y.o. female seen in consultation at the request of Eileen Hart for an abnormal mammogram.  She reports that she felt a nodule in the right breast about 4 weeks ago.  The area is a little bit hypersensitive but no real pain.  It has not grown in size.  She denies any nipple discharge or any skin changes. She did have a history of breast augmentation with submuscular saline implants.  Did not have any complication related to breast surgery.  Is able to perform more than 4 METS of activity without any shortness of breath or chest pain.  She quit smoking more than 3 years ago but she still uses nicotine products.  Her mother had a history of first-degree causing with breast cancer that developed around age 43.  No other family members with cancer. Menarche at age 6.  Gravida 2 para 1M1.  She did have breast augmentation surgery right after breast-feeding. She Does take birth control pills She Did have a C-section 9 years ago. SHe is accompanied by her mother HPI  Past Medical History:  Diagnosis Date   Chlamydia 01/25/2010   Depression with anxiety    Gestational hypertension    Immunization, viral disease    gardasil series completed   Tobacco abuse 05/02/2016    Past Surgical History:  Procedure Laterality Date   AUGMENTATION MAMMAPLASTY Bilateral    saline implants 2015   BREAST BIOPSY Right 10/14/2022   US biopsy/ coil clip/ path pending   BREAST BIOPSY Right 10/14/2022   [place] RT BREAST BX W LOC DEV 1ST LESION IMG BX SPEC US GUIDE 10/14/2022 [facility]   BREAST ENHANCEMENT SURGERY  2015   CESAREAN SECTION  2014   TONSILLECTOMY      Family History  Problem Relation Age of Onset   Breast cancer Other 35       mat 2nd cousin   Diabetes Neg Hx    Heart disease Neg Hx    Hypertension Neg Hx    Ovarian cancer Neg Hx    Colon cancer Neg Hx     Social History Social  History   Tobacco Use   Smoking status: Former    Packs/day: .5    Types: Cigarettes    Quit date: 07/25/2018    Years since quitting: 4.2   Smokeless tobacco: Never   Tobacco comments:    quit 07/2018  Vaping Use   Vaping Use: Never used  Substance Use Topics   Alcohol use: No    Alcohol/week: 0.0 standard drinks of alcohol   Drug use: No    No Known Allergies  Current Outpatient Medications  Medication Sig Dispense Refill   escitalopram (LEXAPRO) 10 MG tablet TAKE 1 TABLET BY MOUTH EVERY DAY 90 tablet 2   norgestimate-ethinyl estradiol (ORTHO-CYCLEN) 0.25-35 MG-MCG tablet Take 1 tablet by mouth daily. 84 tablet 3   No current facility-administered medications for this visit.     Review of Systems Full ROS  was asked and was negative except for the information on the HPI  Physical Exam Blood pressure 135/84, pulse 84, temperature 98.9 F (37.2 C), temperature source Oral, height 5\' 4"  (1.626 m), weight 123 lb 12.8 oz (56.2 kg), last menstrual period 09/23/2022, SpO2 99 %. CONSTITUTIONAL: NAD. EYES: Pupils are equal, round, Sclera are non-icteric. EARS, NOSE, MOUTH AND THROAT: The oral mucosa is pink and  moist. Hearing is intact to voice. LYMPH NODES:  Lymph nodes in the neck are normal. RESPIRATORY:  Lungs are clear. There is normal respiratory effort, with equal breath sounds bilaterally, and without pathologic use of accessory muscles. CARDIOVASCULAR: Heart is regular without murmurs, gallops, or rubs. BREAST: Is evidence of a 1.5 cm nodule located in the right breast at 935 cm from the nipple.  It is mobile and seems to be superficial. The left breast at 8:00 there is some thickening also superficial difficult to exclude potential mass.  This tissue seems to be dense There is no evidence of axillary lymphadenopathy Evidence of prior breast augmentation surgery bilaterally GI: The abdomen is  soft, nontender, and nondistended. There are no palpable masses. There is no  hepatosplenomegaly. There are normal bowel sounds in all quadrants. GU: Rectal deferred.   MUSCULOSKELETAL: Normal muscle strength and tone. No cyanosis or edema.   SKIN: Turgor is good and there are no pathologic skin lesions or ulcers. NEUROLOGIC: Motor and sensation is grossly normal. Cranial nerves are grossly intact. PSYCH:  Oriented to person, place and time. Affect is normal.  Data Reviewed  I have personally reviewed the patient's imaging, laboratory findings and medical records.    Assessment/Plan 32 yo with triple negative Right breast invasive mammary carcinoma Upper outer quadrant .  Patient does have dense breast tissue and on the left breast I am able to feel some thickening within the inner lower quadrant.  Due to those reasons and the patient being 32 years old triple negative with dense breast tissue strongly believe that further imaging with bilateral MRI is indicated.  We will also make sure she follows with genetic counseling as this will help [place] determine surgical therapy.  I had an extensive discussion with the patient and the mother regarding breast cancer.  Regarding surgical therapy to include lumpectomy plus radiation therapy versus mastectomy.  We also briefly discussed reconstruction. She   Does have an appointment tomorrow with Eileen Hart for further biopsy of a probably benign mass within the right breast. Will like to get all the data before committing to any definitive surgical intervention.  Please note that I spent 60 minutes in this encounter including personally reviewing imaging studies, coordinating her care, placing orders and performing appropriate documentation Copy of this report was sent to the referring provider  Eileen Big, MD FACS General Surgeon 10/21/2022, 3:15 PM

## 2022-10-22 ENCOUNTER — Inpatient Hospital Stay: Payer: Self-pay | Attending: Oncology | Admitting: Licensed Clinical Social Worker

## 2022-10-22 ENCOUNTER — Other Ambulatory Visit: Payer: Self-pay | Admitting: Family Medicine

## 2022-10-22 ENCOUNTER — Inpatient Hospital Stay (HOSPITAL_BASED_OUTPATIENT_CLINIC_OR_DEPARTMENT_OTHER): Payer: Self-pay | Admitting: Oncology

## 2022-10-22 ENCOUNTER — Inpatient Hospital Stay: Payer: Medicaid Other

## 2022-10-22 ENCOUNTER — Encounter: Payer: Self-pay | Admitting: Licensed Clinical Social Worker

## 2022-10-22 ENCOUNTER — Encounter: Payer: Self-pay | Admitting: *Deleted

## 2022-10-22 ENCOUNTER — Encounter: Payer: Self-pay | Admitting: Oncology

## 2022-10-22 VITALS — BP 115/78 | HR 84 | Temp 96.5°F | Resp 16 | Ht 64.0 in | Wt 123.9 lb

## 2022-10-22 DIAGNOSIS — C50411 Malignant neoplasm of upper-outer quadrant of right female breast: Secondary | ICD-10-CM | POA: Insufficient documentation

## 2022-10-22 DIAGNOSIS — C50919 Malignant neoplasm of unspecified site of unspecified female breast: Secondary | ICD-10-CM

## 2022-10-22 DIAGNOSIS — Z7189 Other specified counseling: Secondary | ICD-10-CM | POA: Insufficient documentation

## 2022-10-22 DIAGNOSIS — Z87891 Personal history of nicotine dependence: Secondary | ICD-10-CM

## 2022-10-22 DIAGNOSIS — Z171 Estrogen receptor negative status [ER-]: Secondary | ICD-10-CM | POA: Insufficient documentation

## 2022-10-22 DIAGNOSIS — R928 Other abnormal and inconclusive findings on diagnostic imaging of breast: Secondary | ICD-10-CM

## 2022-10-22 DIAGNOSIS — Z803 Family history of malignant neoplasm of breast: Secondary | ICD-10-CM | POA: Insufficient documentation

## 2022-10-22 NOTE — Progress Notes (Signed)
Accompanied patient and family to initial medical oncology appointment.   Reviewed Breast Cancer treatment handbook.   Care plan summary given to patient.   Reviewed outreach programs and cancer center services.   

## 2022-10-22 NOTE — Addendum Note (Signed)
Addended by: Corene Cornea on: 10/22/2022 02:33 PM   Modules accepted: Orders

## 2022-10-22 NOTE — Progress Notes (Signed)
REFERRING PROVIDER: Creig Hines, MD [address] [place],  [place] [address]  PRIMARY PROVIDER:  Glori Luis, MD  PRIMARY REASON FOR VISIT:  1. Invasive carcinoma of breast (HCC)   2. Family history of breast cancer      HISTORY OF PRESENT ILLNESS:   [name], a 32 y.o. female, was seen for a [facility] cancer genetics consultation at the request of Dr. Smith Robert due to a personal history of breast cancer.  Ms. Banke presents to clinic today to discuss the possibility of a hereditary predisposition to cancer, genetic testing, and to further clarify her future cancer risks, as well as potential cancer risks for family members.   CANCER HISTORY:  In 2024, at the age of 88, Ms. Twining was diagnosed with invasive mammary carcinoma of the right breast, triple negative. The treatment plan includes neoadjuvant chemotherapy, surgical plan is still being determined as patient is having bilateral MRI first.   RISK FACTORS:  Menarche was at age 14.  First live birth at age 27.  Ovaries intact: yes.  Hysterectomy: no.  Menopausal status: premenopausal.  Colonoscopy: n/a   Past Medical History:  Diagnosis Date   Chlamydia 01/25/2010   Depression with anxiety    Gestational hypertension    Immunization, viral disease    gardasil series completed   Tobacco abuse 05/02/2016    Past Surgical History:  Procedure Laterality Date   AUGMENTATION MAMMAPLASTY Bilateral    saline implants 2015   BREAST BIOPSY Right 10/14/2022   US biopsy/ coil clip/ path pending   BREAST BIOPSY Right 10/14/2022   [place] RT BREAST BX W LOC DEV 1ST LESION IMG BX SPEC US GUIDE 10/14/2022 [facility]   BREAST ENHANCEMENT SURGERY  2015   CESAREAN SECTION  2014   TONSILLECTOMY      FAMILY HISTORY:  We obtained a detailed, 4-generation family history.  Significant diagnoses are listed below: Family History  Problem Relation Age of Onset   Breast cancer Other 35       mat 2nd cousin   Diabetes Neg  Hx    Heart disease Neg Hx    Hypertension Neg Hx    Ovarian cancer Neg Hx    Colon cancer Neg Hx    Ms. Quigley has 1 son, 75. She has 1 maternal half sister, 1 paternal half brother, 2 paternal half sisters.   Ms. Hennington's mother is living at 27. Patient's great aunt's daughter (1st cousin once removed) had breast cancer in her 30s and died of acute leukemia. Another first cousin once removed died of leukemia in her teens. Maternal great grandfather had leukemia.  Ms. Yo's father is living at 17. Limited information about this side of the family. No known cancers. Possible lung cancer in paternal grandmother.  Ms. Harney is unaware of previous family history of genetic testing for hereditary cancer risks. There is no reported Ashkenazi Jewish ancestry. There is no known consanguinity.    GENETIC COUNSELING ASSESSMENT: Ms. Tatham is a 32 y.o. female with a personal and family history of early onset breast cancer which is somewhat suggestive of a hereditary cancer syndrome and predisposition to cancer. We, therefore, discussed and recommended the following at today's visit.   DISCUSSION: We discussed that approximately 10% of breast cancer is hereditary. Most cases of hereditary breast cancer are associated with BRCA1/BRCA2 genes, although there are other genes associated with hereditary cancer as well. Cancers and risks are gene specific. We discussed that testing is beneficial for  several reasons including knowing about cancer risks, identifying potential screening and risk-reduction options that may be appropriate, and to understand if other family members could be at risk for cancer and allow them to undergo genetic testing.   We reviewed the characteristics, features and inheritance patterns of hereditary cancer syndromes. We also discussed genetic testing, including the appropriate family members to test, the process of testing, insurance coverage and turn-around-time for results. We  discussed the implications of a negative, positive and/or variant of uncertain significant result. We recommended Ms. Woodrum pursue genetic testing for the [facility] Multi-Cancer+RNA gene panel.   Based on Ms. Frink's personal and family history of cancer, she meets medical criteria for genetic testing. Despite that she meets criteria, she may still have an out of pocket cost. We discussed that if her out of pocket cost for testing is over $100, the laboratory will call and confirm whether she wants to proceed with testing.  If the out of pocket cost of testing is less than $100 she will be billed by the genetic testing laboratory.   PLAN: After considering the risks, benefits, and limitations, Ms. Delude provided informed consent to pursue genetic testing and the blood sample was sent to [facility] for analysis of the Multi-Cancer+RNA panel. Results should be available within approximately 2-3 weeks' time, at which point they will be disclosed by telephone to Ms. Aguillard, as will any additional recommendations warranted by these results. Ms. Dimaio will receive a summary of her genetic counseling visit and a copy of her results once available. This information will also be available in Epic.   Ms. Styer's questions were answered to her satisfaction today. Our contact information was provided should additional questions or concerns arise. Thank you for the referral and allowing [place] to share in the care of your patient.   Lacy Duverney, MS, [facility] Genetic Counselor [email or web address] Phone: [phone]  The patient was seen for a total of 15 minutes in face-to-face genetic counseling. Patient's mother was also present.  Dr. Orlie Dakin was available for discussion regarding this case.   _______________________________________________________________________ For Office Staff:  Number of people involved in session: 2 Was an Intern/ student involved with case: no

## 2022-10-23 ENCOUNTER — Telehealth: Payer: Self-pay | Admitting: Surgery

## 2022-10-23 NOTE — Telephone Encounter (Signed)
Patient has been advised of Pre-Admission date/time, and Surgery date at [facility].  Surgery Date: 10/24/22 Preadmission Testing Date: 10/24/22 (patient to arrive 2 hrs early)  Patient has been made aware to call [phone], between 1-3:00pm the day before surgery, to find out what time to arrive for surgery.   '

## 2022-10-23 NOTE — Addendum Note (Signed)
Addended by: Sterling Big F on: 10/23/2022 01:18 PM   Modules accepted: Orders

## 2022-10-24 ENCOUNTER — Other Ambulatory Visit: Payer: Self-pay

## 2022-10-24 ENCOUNTER — Ambulatory Visit: Payer: Self-pay

## 2022-10-24 ENCOUNTER — Ambulatory Visit: Payer: Medicaid Other | Admitting: Urgent Care

## 2022-10-24 ENCOUNTER — Encounter: Payer: Self-pay | Admitting: Surgery

## 2022-10-24 ENCOUNTER — Encounter
Admission: RE | Disposition: A | Discharge status: Home / Self Care | Payer: Self-pay | Source: Home / Self Care | Attending: Surgery

## 2022-10-24 ENCOUNTER — Ambulatory Visit
Admission: RE | Admit: 2022-10-24 | Discharge: 2022-10-24 | Disposition: A | Discharge status: Home / Self Care | Payer: Medicaid Other | Attending: Surgery | Admitting: Surgery

## 2022-10-24 DIAGNOSIS — I10 Essential (primary) hypertension: Secondary | ICD-10-CM | POA: Diagnosis not present

## 2022-10-24 DIAGNOSIS — Z803 Family history of malignant neoplasm of breast: Secondary | ICD-10-CM | POA: Diagnosis not present

## 2022-10-24 DIAGNOSIS — F419 Anxiety disorder, unspecified: Secondary | ICD-10-CM | POA: Diagnosis not present

## 2022-10-24 DIAGNOSIS — Z171 Estrogen receptor negative status [ER-]: Secondary | ICD-10-CM | POA: Insufficient documentation

## 2022-10-24 DIAGNOSIS — F32A Depression, unspecified: Secondary | ICD-10-CM | POA: Insufficient documentation

## 2022-10-24 DIAGNOSIS — C50411 Malignant neoplasm of upper-outer quadrant of right female breast: Secondary | ICD-10-CM | POA: Insufficient documentation

## 2022-10-24 DIAGNOSIS — Z87891 Personal history of nicotine dependence: Secondary | ICD-10-CM | POA: Insufficient documentation

## 2022-10-24 HISTORY — PX: PORTACATH PLACEMENT: SHX2246

## 2022-10-24 LAB — POCT PREGNANCY, URINE: Preg Test, Ur: NEGATIVE

## 2022-10-24 SURGERY — INSERTION, TUNNELED CENTRAL VENOUS DEVICE, WITH PORT
Anesthesia: General

## 2022-10-24 MED ORDER — GABAPENTIN 300 MG PO CAPS
300.0000 mg | ORAL_CAPSULE | ORAL | Status: AC
  Administered 2022-10-24: 300 mg via ORAL

## 2022-10-24 MED ORDER — 0.9 % SODIUM CHLORIDE (POUR BTL) OPTIME
TOPICAL | Status: DC | PRN
  Administered 2022-10-24: 500 mL

## 2022-10-24 MED ORDER — BUPIVACAINE-EPINEPHRINE (PF) 0.25% -1:200000 IJ SOLN
INTRAMUSCULAR | Status: DC | PRN
  Administered 2022-10-24: 10 mL

## 2022-10-24 MED ORDER — LIDOCAINE HCL (PF) 1 % IJ SOLN
INTRAMUSCULAR | Status: AC
  Filled 2022-10-24: qty 30

## 2022-10-24 MED ORDER — OXYCODONE HCL 5 MG PO TABS: 5.0000 mg | ORAL_TABLET | Freq: Once | ORAL | Status: DC | PRN

## 2022-10-24 MED ORDER — ORAL CARE MOUTH RINSE: 15.0000 mL | Freq: Once | OROMUCOSAL | Status: AC

## 2022-10-24 MED ORDER — FENTANYL CITRATE (PF) 100 MCG/2ML IJ SOLN
INTRAMUSCULAR | Status: DC | PRN
  Administered 2022-10-24: 100 ug via INTRAVENOUS

## 2022-10-24 MED ORDER — ACETAMINOPHEN 500 MG PO TABS
1000.0000 mg | ORAL_TABLET | ORAL | Status: AC
  Administered 2022-10-24: 1000 mg via ORAL

## 2022-10-24 MED ORDER — LACTATED RINGERS IV SOLN: INTRAVENOUS | Status: DC

## 2022-10-24 MED ORDER — OXYCODONE HCL 5 MG/5ML PO SOLN: 5.0000 mg | Freq: Once | ORAL | Status: DC | PRN

## 2022-10-24 MED ORDER — EPINEPHRINE PF 1 MG/ML IJ SOLN
INTRAMUSCULAR | Status: AC
  Filled 2022-10-24: qty 1

## 2022-10-24 MED ORDER — ACETAMINOPHEN 500 MG PO TABS
ORAL_TABLET | ORAL | Status: AC
  Filled 2022-10-24: qty 2

## 2022-10-24 MED ORDER — CHLORHEXIDINE GLUCONATE CLOTH 2 % EX PADS: 6.0000 | MEDICATED_PAD | Freq: Once | CUTANEOUS | Status: DC

## 2022-10-24 MED ORDER — MIDAZOLAM HCL 2 MG/2ML IJ SOLN
INTRAMUSCULAR | Status: AC
  Filled 2022-10-24: qty 2

## 2022-10-24 MED ORDER — CEFAZOLIN SODIUM-DEXTROSE 2-4 GM/100ML-% IV SOLN
2.0000 g | INTRAVENOUS | Status: AC
  Administered 2022-10-24: 2 g via INTRAVENOUS

## 2022-10-24 MED ORDER — HYDROCODONE-ACETAMINOPHEN 5-325 MG PO TABS
1.0000 | ORAL_TABLET | Freq: Four times a day (QID) | ORAL | 0 refills | Status: DC | PRN
Start: 2022-10-24 — End: 2023-02-05

## 2022-10-24 MED ORDER — PROPOFOL 1000 MG/100ML IV EMUL
INTRAVENOUS | Status: AC
  Filled 2022-10-24: qty 100

## 2022-10-24 MED ORDER — HEPARIN 5000 UNITS IN NS 1000 ML (FLUSH)
INTRAMUSCULAR | Status: DC | PRN
  Administered 2022-10-24: 2 mL via INTRAMUSCULAR

## 2022-10-24 MED ORDER — DEXMEDETOMIDINE HCL IN NACL 80 MCG/20ML IV SOLN
INTRAVENOUS | Status: DC | PRN
  Administered 2022-10-24: 8 ug via INTRAVENOUS

## 2022-10-24 MED ORDER — MIDAZOLAM HCL 2 MG/2ML IJ SOLN
INTRAMUSCULAR | Status: DC | PRN
  Administered 2022-10-24: 2 mg via INTRAVENOUS

## 2022-10-24 MED ORDER — PHENYLEPHRINE HCL (PRESSORS) 10 MG/ML IV SOLN
INTRAVENOUS | Status: DC | PRN
  Administered 2022-10-24: 160 ug via INTRAVENOUS
  Administered 2022-10-24 (×2): 80 ug via INTRAVENOUS

## 2022-10-24 MED ORDER — CHLORHEXIDINE GLUCONATE 0.12 % MT SOLN
OROMUCOSAL | Status: AC
  Filled 2022-10-24: qty 15

## 2022-10-24 MED ORDER — CELECOXIB 200 MG PO CAPS
200.0000 mg | ORAL_CAPSULE | ORAL | Status: AC
  Administered 2022-10-24: 200 mg via ORAL

## 2022-10-24 MED ORDER — PROPOFOL 10 MG/ML IV BOLUS
INTRAVENOUS | Status: AC
  Filled 2022-10-24: qty 20

## 2022-10-24 MED ORDER — CELECOXIB 200 MG PO CAPS
ORAL_CAPSULE | ORAL | Status: AC
  Filled 2022-10-24: qty 1

## 2022-10-24 MED ORDER — BUPIVACAINE HCL (PF) 0.25 % IJ SOLN
INTRAMUSCULAR | Status: AC
  Filled 2022-10-24: qty 30

## 2022-10-24 MED ORDER — HEPARIN SODIUM (PORCINE) 5000 UNIT/ML IJ SOLN
INTRAMUSCULAR | Status: AC
  Filled 2022-10-24: qty 1

## 2022-10-24 MED ORDER — LIDOCAINE HCL (PF) 1 % IJ SOLN
INTRAMUSCULAR | Status: DC | PRN
  Administered 2022-10-24: 10 mL

## 2022-10-24 MED ORDER — ONDANSETRON HCL 4 MG/2ML IJ SOLN
INTRAMUSCULAR | Status: DC | PRN
  Administered 2022-10-24: 4 mg via INTRAVENOUS

## 2022-10-24 MED ORDER — CHLORHEXIDINE GLUCONATE CLOTH 2 % EX PADS
6.0000 | MEDICATED_PAD | Freq: Once | CUTANEOUS | Status: AC
  Administered 2022-10-24: 6 via TOPICAL

## 2022-10-24 MED ORDER — GABAPENTIN 300 MG PO CAPS
ORAL_CAPSULE | ORAL | Status: AC
  Filled 2022-10-24: qty 1

## 2022-10-24 MED ORDER — ACETAMINOPHEN 10 MG/ML IV SOLN: 1000.0000 mg | Freq: Once | INTRAVENOUS | Status: DC | PRN

## 2022-10-24 MED ORDER — FENTANYL CITRATE (PF) 100 MCG/2ML IJ SOLN
INTRAMUSCULAR | Status: AC
  Filled 2022-10-24: qty 2

## 2022-10-24 MED ORDER — CHLORHEXIDINE GLUCONATE 0.12 % MT SOLN
15.0000 mL | Freq: Once | OROMUCOSAL | Status: AC
  Administered 2022-10-24: 15 mL via OROMUCOSAL

## 2022-10-24 MED ORDER — ONDANSETRON HCL 4 MG/2ML IJ SOLN: 4.0000 mg | Freq: Once | INTRAMUSCULAR | Status: DC | PRN

## 2022-10-24 MED ORDER — DEXAMETHASONE SODIUM PHOSPHATE 10 MG/ML IJ SOLN
INTRAMUSCULAR | Status: DC | PRN
  Administered 2022-10-24: 5 mg via INTRAVENOUS

## 2022-10-24 MED ORDER — CEFAZOLIN SODIUM-DEXTROSE 2-4 GM/100ML-% IV SOLN
INTRAVENOUS | Status: AC
  Filled 2022-10-24: qty 100

## 2022-10-24 MED ORDER — FENTANYL CITRATE (PF) 100 MCG/2ML IJ SOLN: 25.0000 ug | INTRAMUSCULAR | Status: DC | PRN

## 2022-10-24 MED ORDER — PROPOFOL 500 MG/50ML IV EMUL
INTRAVENOUS | Status: DC | PRN
  Administered 2022-10-24: 120 ug/kg/min via INTRAVENOUS
  Administered 2022-10-24: 40 ug/kg/min via INTRAVENOUS

## 2022-10-24 SURGICAL SUPPLY — 41 items
ADH SKN CLS APL DERMABOND .7 (GAUZE/BANDAGES/DRESSINGS) ×1
APL PRP STRL LF DISP 70% ISPRP (MISCELLANEOUS) ×1
BAG DECANTER FOR FLEXI CONT (MISCELLANEOUS) ×2 IMPLANT
BLADE SURG SZ11 CARB STEEL (BLADE) ×1 IMPLANT
BOOT SUTURE VASCULAR YLW (MISCELLANEOUS) ×1
CHLORAPREP W/TINT 26 (MISCELLANEOUS) ×1 IMPLANT
CLAMP SUTURE YELLOW 5 PAIRS (MISCELLANEOUS) ×1 IMPLANT
COVER LIGHT HANDLE STERIS (MISCELLANEOUS) ×2 IMPLANT
DERMABOND ADVANCED .7 DNX12 (GAUZE/BANDAGES/DRESSINGS) ×1 IMPLANT
DRAPE 3/4 80X56 (DRAPES) ×1 IMPLANT
DRAPE C-ARM XRAY 36X54 (DRAPES) ×2 IMPLANT
DRAPE INCISE IOBAN 66X45 STRL (DRAPES) ×1 IMPLANT
ELECT CAUTERY BLADE 6.4 (BLADE) ×1 IMPLANT
ELECT REM PT RETURN 9FT ADLT (ELECTROSURGICAL) ×1
ELECTRODE REM PT RTRN 9FT ADLT (ELECTROSURGICAL) ×1 IMPLANT
GAUZE 4X4 16PLY ~~LOC~~+RFID DBL (SPONGE) ×1 IMPLANT
GEL ULTRASOUND 20GR AQUASONIC (MISCELLANEOUS) ×1 IMPLANT
GLOVE BIO SURGEON STRL SZ7 (GLOVE) ×1 IMPLANT
GOWN STRL REUS W/ TWL LRG LVL3 (GOWN DISPOSABLE) ×2 IMPLANT
GOWN STRL REUS W/TWL LRG LVL3 (GOWN DISPOSABLE) ×2
IV NS 500ML (IV SOLUTION) ×1
IV NS 500ML BAXH (IV SOLUTION) ×1 IMPLANT
KIT PORT POWER 8FR ISP CVUE (Port) ×1 IMPLANT
MANIFOLD NEPTUNE II (INSTRUMENTS) ×1 IMPLANT
NDL HYPO 22X1.5 SAFETY MO (MISCELLANEOUS) ×1 IMPLANT
NEEDLE HYPO 22X1.5 SAFETY MO (MISCELLANEOUS) ×1 IMPLANT
PACK PORT-A-CATH (MISCELLANEOUS) ×1 IMPLANT
PENCIL SMOKE EVACUATOR (MISCELLANEOUS) IMPLANT
SPONGE T-LAP 18X18 ~~LOC~~+RFID (SPONGE) ×1 IMPLANT
SUT MNCRL AB 4-0 PS2 18 (SUTURE) ×1 IMPLANT
SUT PROLENE 2-0 (SUTURE) ×1
SUT PROLENE 2-0 RB1 36X2 ARM (SUTURE) ×1
SUT VIC AB 3-0 SH 27 (SUTURE) ×1
SUT VIC AB 3-0 SH 27X BRD (SUTURE) ×1 IMPLANT
SUTURE PROLEN 2-0 RB1 36X2 ARM (SUTURE) ×1 IMPLANT
SYR 10ML LL (SYRINGE) ×1 IMPLANT
SYR 20ML LL LF (SYRINGE) ×1 IMPLANT
SYR 5ML LL (SYRINGE) ×1 IMPLANT
TAG SUTURE CLAMP YLW 5PR (MISCELLANEOUS) ×1
TOWEL OR 17X26 4PK STRL BLUE (TOWEL DISPOSABLE) ×1 IMPLANT
TRAP FLUID SMOKE EVACUATOR (MISCELLANEOUS) ×1 IMPLANT

## 2022-10-24 NOTE — Anesthesia Preprocedure Evaluation (Addendum)
Anesthesia Evaluation  Patient identified by MRN, date of birth, ID band Patient awake    Reviewed: Allergy & Precautions, NPO status , Patient's Chart, lab work & pertinent test results  History of Anesthesia Complications Negative for: history of anesthetic complications  Airway Mallampati: III   Neck ROM: Full    Dental no notable dental hx.    Pulmonary former smoker (quit 2020)   Pulmonary exam normal breath sounds clear to auscultation       Cardiovascular hypertension, Normal cardiovascular exam Rhythm:Regular Rate:Normal     Neuro/Psych  PSYCHIATRIC DISORDERS Anxiety Depression    negative neurological ROS     GI/Hepatic negative GI ROS,,,  Endo/Other  negative endocrine ROS    Renal/GU negative Renal ROS     Musculoskeletal   Abdominal   Peds  Hematology Breast CA   Anesthesia Other Findings   Reproductive/Obstetrics                             Anesthesia Physical Anesthesia Plan  ASA: 2  Anesthesia Plan: General   Post-op Pain Management:    Induction: Intravenous  PONV Risk Score and Plan: 3 and Ondansetron, Dexamethasone, Treatment may vary due to age or medical condition, Propofol infusion and TIVA  Airway Management Planned: Natural Airway  Additional Equipment:   Intra-op Plan:   Post-operative Plan:   Informed Consent: I have reviewed the patients History and Physical, chart, labs and discussed the procedure including the risks, benefits and alternatives for the proposed anesthesia with the patient or authorized representative who has indicated his/her understanding and acceptance.       Plan Discussed with: CRNA  Anesthesia Plan Comments: (LMA/GETA backup discussed.  Patient consented for risks of anesthesia including but not limited to:  - adverse reactions to medications - damage to eyes, teeth, lips or other oral mucosa - nerve damage due to  positioning  - sore throat or hoarseness - damage to heart, brain, nerves, lungs, other parts of body or loss of life  Informed patient about role of CRNA in peri- and intra-operative care.  Patient voiced understanding.)        Anesthesia Quick Evaluation

## 2022-10-24 NOTE — Interval H&P Note (Signed)
History and Physical Interval Note:  10/24/2022 7:25 AM  Eileen Hart  has presented today for surgery, with the diagnosis of breast cancer right.  The various methods of treatment have been discussed with the patient and family. After consideration of risks, benefits and other options for treatment, the patient has consented to  Procedure(s): INSERTION PORT-A-CATH (N/A) as a surgical intervention.  The patient's history has been reviewed, patient examined, no change in status, stable for surgery.  I have reviewed the patient's chart and labs.  Questions were answered to the patient's satisfaction.     Derika Eckles F Lama Narayanan

## 2022-10-24 NOTE — Op Note (Signed)
  Pre-operative Diagnosis: Right breast CA  Post-operative Diagnosis: same   Surgeon: Sterling Big, MD FACS  Anesthesia: IV sedation, marcaine .25% w epi and lidocaine 1%  Procedure: Left IJ  Port placement with fluoroscopy under U/S guidance  Findings: Good position of the tip of the catheter by fluoroscopy  Estimated Blood Loss: Minimal         Drains: None         Specimens: None       Complications: none            Procedure Details  The patient was seen again in the Holding Room. The benefits, complications, treatment options, and expected outcomes were discussed with the patient. The risks of bleeding, infection, recurrence of symptoms, failure to resolve symptoms,  thrombosis nonfunction breakage pneumothorax hemopneumothorax any of which could require chest tube or further surgery were reviewed with the patient.   The patient was taken to Operating Room, identified as Eileen Hart and the procedure verified.  A Time Out was held and the above information confirmed.  Prior to the induction of general anesthesia, antibiotic prophylaxis was administered. VTE prophylaxis was in place. Appropriate anesthesia was then administered and tolerated well. The chest was prepped with Chloraprep and draped in the sterile fashion. The patient was positioned in the supine position. Then the patient was placed in Trendelenburg position.  Patient was prepped and draped in sterile fashion and in a Trendelenburg position local anesthetic was infiltrated into the skin and subcutaneous tissues in the neck and anterior chest wall. The large bore needle was placed into the internal jugular vein under U/S guidance without difficulty and then the Seldinger wire was advanced. Fluoroscopy was utilized to confirm that the Seldinger wire was in the superior vena cava.  An incision was made and a port pocket developed with blunt and electrocautery dissection. The introducer dilator was placed over the  Seldinger wire the wire was removed. The previously flushed catheter was placed into the introducer dilator and the peel-away sheath was removed. The catheter length was confirmed and trimmed utilizing fluoroscopy for proper positioning. The catheter was then attached to the previously flushed port. The port was placed into the pocket. The port was held in with 2-0 Prolenes and flushed for function and heparin locked.  The wound was closed with interrupted 3-0 Vicryl followed by 4-0 subcuticular Monocryl sutures. Dermabond used to coat the skin  Patient was taken to the recovery room in stable condition where a postoperative chest film has been ordered.

## 2022-10-24 NOTE — Anesthesia Postprocedure Evaluation (Signed)
Anesthesia Post Note  Patient: Eileen Hart  Procedure(s) Performed: INSERTION PORT-A-CATH  Patient location during evaluation: PACU Anesthesia Type: General Level of consciousness: awake and alert, oriented and patient cooperative Pain management: pain level controlled Vital Signs Assessment: post-procedure vital signs reviewed and stable Respiratory status: spontaneous breathing, nonlabored ventilation and respiratory function stable Cardiovascular status: blood pressure returned to baseline and stable Postop Assessment: adequate PO intake Anesthetic complications: no   No notable events documented.   Last Vitals:  Vitals:   10/24/22 0930 10/24/22 0945  BP: (!) 80/51 98/67  Pulse: (!) 56 (!) 52  Resp: 18 19  Temp:    SpO2: 98% 100%    Last Pain:  Vitals:   10/24/22 0930  TempSrc:   PainSc: 0-No pain                 Reed Breech

## 2022-10-24 NOTE — Discharge Instructions (Addendum)
AMBULATORY SURGERY  DISCHARGE INSTRUCTIONS   The drugs that you were given will stay in your system until tomorrow so for the next 24 hours you should not:  Drive an automobile Make any legal decisions Drink any alcoholic beverage   You may resume regular meals tomorrow.  Today it is better to start with liquids and gradually work up to solid foods.  You may eat anything you prefer, but it is better to start with liquids, then soup and crackers, and gradually work up to solid foods.   Please notify your doctor immediately if you have any unusual bleeding, trouble breathing, redness and pain at the surgery site, drainage, fever, or pain not relieved by medication.   Please call to schedule your post-operative visit.  

## 2022-10-24 NOTE — Transfer of Care (Signed)
Immediate Anesthesia Transfer of Care Note  Patient: Eileen Hart  Procedure(s) Performed: INSERTION PORT-A-CATH  Patient Location: PACU  Anesthesia Type:General  Level of Consciousness: drowsy  Airway & Oxygen Therapy: Patient Spontanous Breathing and Patient connected to face mask oxygen  Post-op Assessment: Report given to RN  Post vital signs: stable  Last Vitals:  Vitals Value Taken Time  BP 83/55 10/24/22 0840  Temp    Pulse 66 10/24/22 0842  Resp 11 10/24/22 0842  SpO2 100 % 10/24/22 0842  Vitals shown include unvalidated device data.  Last Pain:  Vitals:   10/24/22 0625  TempSrc: Temporal  PainSc: 0-No pain         Complications: No notable events documented.

## 2022-10-25 ENCOUNTER — Ambulatory Visit
Admission: RE | Admit: 2022-10-25 | Discharge: 2022-10-25 | Disposition: A | Discharge status: Home / Self Care | Payer: Medicaid Other | Source: Ambulatory Visit | Attending: Surgery | Admitting: Surgery

## 2022-10-25 ENCOUNTER — Other Ambulatory Visit: Payer: Self-pay | Admitting: Surgery

## 2022-10-25 ENCOUNTER — Other Ambulatory Visit: Payer: Self-pay

## 2022-10-25 ENCOUNTER — Encounter: Payer: Self-pay | Admitting: Surgery

## 2022-10-25 DIAGNOSIS — N63 Unspecified lump in unspecified breast: Secondary | ICD-10-CM

## 2022-10-25 DIAGNOSIS — R9230 Dense breasts, unspecified: Secondary | ICD-10-CM | POA: Insufficient documentation

## 2022-10-25 MED ORDER — GADOBUTROL 1 MMOL/ML IV SOLN
5.0000 mL | Freq: Once | INTRAVENOUS | Status: AC | PRN
  Administered 2022-10-25: 5 mL via INTRAVENOUS

## 2022-10-28 ENCOUNTER — Other Ambulatory Visit: Payer: Self-pay | Admitting: Family Medicine

## 2022-10-28 ENCOUNTER — Encounter
Admission: RE | Admit: 2022-10-28 | Discharge: 2022-10-28 | Disposition: A | Discharge status: Home / Self Care | Payer: Medicaid Other | Source: Ambulatory Visit | Attending: Oncology | Admitting: Oncology

## 2022-10-28 ENCOUNTER — Other Ambulatory Visit: Payer: Self-pay | Admitting: Family

## 2022-10-28 DIAGNOSIS — R928 Other abnormal and inconclusive findings on diagnostic imaging of breast: Secondary | ICD-10-CM

## 2022-10-28 DIAGNOSIS — Z171 Estrogen receptor negative status [ER-]: Secondary | ICD-10-CM

## 2022-10-28 DIAGNOSIS — C50411 Malignant neoplasm of upper-outer quadrant of right female breast: Secondary | ICD-10-CM | POA: Insufficient documentation

## 2022-10-28 DIAGNOSIS — Z7189 Other specified counseling: Secondary | ICD-10-CM

## 2022-10-28 MED ORDER — TECHNETIUM TC 99M-LABELED RED BLOOD CELLS IV KIT
20.0000 | PACK | Freq: Once | INTRAVENOUS | Status: AC | PRN
  Administered 2022-10-28: 21.94 via INTRAVENOUS

## 2022-10-29 ENCOUNTER — Ambulatory Visit
Admission: RE | Admit: 2022-10-29 | Discharge: 2022-10-29 | Disposition: A | Discharge status: Home / Self Care | Payer: Medicaid Other | Source: Ambulatory Visit | Attending: Family Medicine | Admitting: Family Medicine

## 2022-10-29 ENCOUNTER — Other Ambulatory Visit: Payer: Self-pay | Admitting: *Deleted

## 2022-10-29 ENCOUNTER — Other Ambulatory Visit: Payer: Self-pay | Admitting: Family Medicine

## 2022-10-29 ENCOUNTER — Ambulatory Visit
Admission: RE | Admit: 2022-10-29 | Discharge: 2022-10-29 | Disposition: A | Discharge status: Home / Self Care | Payer: Medicaid Other | Source: Ambulatory Visit | Attending: Surgery | Admitting: Surgery

## 2022-10-29 DIAGNOSIS — C50911 Malignant neoplasm of unspecified site of right female breast: Secondary | ICD-10-CM | POA: Diagnosis present

## 2022-10-29 DIAGNOSIS — N63 Unspecified lump in unspecified breast: Secondary | ICD-10-CM | POA: Insufficient documentation

## 2022-10-29 DIAGNOSIS — N6321 Unspecified lump in the left breast, upper outer quadrant: Secondary | ICD-10-CM | POA: Insufficient documentation

## 2022-10-29 DIAGNOSIS — R928 Other abnormal and inconclusive findings on diagnostic imaging of breast: Secondary | ICD-10-CM

## 2022-10-29 DIAGNOSIS — N6021 Fibroadenosis of right breast: Secondary | ICD-10-CM | POA: Diagnosis not present

## 2022-10-29 DIAGNOSIS — C50111 Malignant neoplasm of central portion of right female breast: Secondary | ICD-10-CM | POA: Insufficient documentation

## 2022-10-29 DIAGNOSIS — N6325 Unspecified lump in the left breast, overlapping quadrants: Secondary | ICD-10-CM | POA: Insufficient documentation

## 2022-10-29 DIAGNOSIS — N6311 Unspecified lump in the right breast, upper outer quadrant: Secondary | ICD-10-CM | POA: Diagnosis not present

## 2022-10-29 DIAGNOSIS — N6341 Unspecified lump in right breast, subareolar: Secondary | ICD-10-CM | POA: Diagnosis not present

## 2022-10-29 HISTORY — PX: BREAST BIOPSY: SHX20

## 2022-10-29 MED ORDER — LIDOCAINE HCL (PF) 1 % IJ SOLN
1.0000 mL | Freq: Once | INTRAMUSCULAR | Status: AC
  Administered 2022-10-29: 1 mL via INTRADERMAL
  Filled 2022-10-29: qty 2

## 2022-10-29 MED ORDER — LIDOCAINE-EPINEPHRINE 1 %-1:100000 IJ SOLN
2.0000 mL | Freq: Once | INTRAMUSCULAR | Status: AC
  Administered 2022-10-29: 2 mL
  Filled 2022-10-29: qty 2

## 2022-10-29 MED ORDER — LIDOCAINE-EPINEPHRINE 1 %-1:100000 IJ SOLN
3.0000 mL | Freq: Once | INTRAMUSCULAR | Status: AC
  Administered 2022-10-29: 3 mL
  Filled 2022-10-29: qty 3

## 2022-10-30 ENCOUNTER — Telehealth: Payer: Self-pay | Admitting: Licensed Clinical Social Worker

## 2022-10-30 ENCOUNTER — Ambulatory Visit: Payer: Self-pay | Admitting: Licensed Clinical Social Worker

## 2022-10-30 ENCOUNTER — Encounter: Payer: Self-pay | Admitting: Licensed Clinical Social Worker

## 2022-10-30 DIAGNOSIS — Z1379 Encounter for other screening for genetic and chromosomal anomalies: Secondary | ICD-10-CM | POA: Insufficient documentation

## 2022-10-30 LAB — SURGICAL PATHOLOGY

## 2022-10-30 NOTE — Telephone Encounter (Signed)
I contacted Ms. Komatsu to discuss her genetic testing results. No pathogenic variants were identified in the 70 genes analyzed. Detailed clinic note to follow.   The test report has been scanned into EPIC and is located under the Molecular Pathology section of the Results Review tab.  A portion of the result report is included below for reference.      Lacy Duverney, MS, [facility] Genetic Counselor [email or web address] Phone: [phone]

## 2022-10-30 NOTE — Progress Notes (Signed)
HPI:   [name] was previously seen in the [facility] due to a personal and family Hart of cancer and concerns regarding a hereditary predisposition to cancer. Please refer to our prior cancer genetics clinic note for more information regarding our discussion, assessment and recommendations, at the time. [name] recent genetic test results were disclosed to her, as were recommendations warranted by these results. These results and recommendations are discussed in more detail below.  CANCER Hart:  Oncology Hart  Malignant neoplasm of upper-outer quadrant of right breast in female, estrogen receptor negative (HCC)  10/22/2022 Initial Diagnosis   Malignant neoplasm of upper-outer quadrant of right breast in female, estrogen receptor negative (HCC)   10/22/2022 Cancer Staging   Staging form: Breast, AJCC 8th Edition - Clinical stage from 10/22/2022: Stage IB (cT1c, cN0, cM0, G3, ER-, PR-, HER2-) - Signed by Creig Hines, MD on 10/22/2022 Stage prefix: Initial diagnosis Histologic grading system: 3 grade system    Genetic Testing   Negative genetic testing. No pathogenic variants identified on the [facility] Multi-Cancer+RNA panel. The report date is 10/28/2022.  The Multi-Cancer + RNA Panel offered by [facility] includes sequencing and/or deletion/duplication analysis of the following 70 genes:  AIP*, ALK, APC*, ATM*, AXIN2*, BAP1*, BARD1*, BLM*, BMPR1A*, BRCA1*, BRCA2*, BRIP1*, CDC73*, CDH1*, CDK4, CDKN1B*, CDKN2A, CHEK2*, CTNNA1*, DICER1*, EPCAM, EGFR, FH*, FLCN*, GREM1, HOXB13, KIT, LZTR1, MAX*, MBD4, MEN1*, MET, MITF, MLH1*, MSH2*, MSH3*, MSH6*, MUTYH*, NF1*, NF2*, NTHL1*, PALB2*, PDGFRA, PMS2*, POLD1*, POLE*, POT1*, PRKAR1A*, PTCH1*, PTEN*, RAD51C*, RAD51D*, RB1*, RET, SDHA*, SDHAF2*, SDHB*, SDHC*, SDHD*, SMAD4*, SMARCA4*, SMARCB1*, SMARCE1*, STK11*, SUFU*, TMEM127*, TP53*, TSC1*, TSC2*, VHL*. RNA analysis is performed for * genes.     FAMILY Hart:  We  obtained a detailed, 4-generation family Hart.  Significant diagnoses are listed below: Family Hart  Problem Relation Age of Onset   Breast cancer Other 35       mat 2nd cousin   Diabetes Neg Hx    Heart disease Neg Hx    Hypertension Neg Hx    Ovarian cancer Neg Hx    Colon cancer Neg Hx     [name], 59. She has 1 maternal half sister, 1 paternal half brother, 2 paternal half sisters.    [name] Patient's great aunt's daughter (1st cousin once removed) had breast cancer in her 30s and died of acute leukemia. Another first cousin once removed died of leukemia in her teens. Maternal great grandfather had leukemia.   [name] father is living at Hart. Limited information about this side of the family. No known cancers. Possible lung cancer in paternal grandmother.   [name] of genetic testing for hereditary cancer risks. There is no reported Ashkenazi Jewish ancestry. There is no known consanguinity.       GENETIC TEST RESULTS:  The [facility] Multi-Cancer+RNA Panel found no pathogenic mutations.   The Multi-Cancer + RNA Panel offered by [facility] includes sequencing and/or deletion/duplication analysis of the following 70 genes:  AIP*, ALK, APC*, ATM*, AXIN2*, BAP1*, BARD1*, BLM*, BMPR1A*, BRCA1*, BRCA2*, BRIP1*, CDC73*, CDH1*, CDK4, CDKN1B*, CDKN2A, CHEK2*, CTNNA1*, DICER1*, EPCAM, EGFR, FH*, FLCN*, GREM1, HOXB13, KIT, LZTR1, MAX*, MBD4, MEN1*, MET, MITF, MLH1*, MSH2*, MSH3*, MSH6*, MUTYH*, NF1*, NF2*, NTHL1*, PALB2*, PDGFRA, PMS2*, POLD1*, POLE*, POT1*, PRKAR1A*, PTCH1*, PTEN*, RAD51C*, RAD51D*, RB1*, RET, SDHA*, SDHAF2*, SDHB*, SDHC*, SDHD*, SMAD4*, SMARCA4*, SMARCB1*, SMARCE1*, STK11*, SUFU*, TMEM127*, TP53*, TSC1*, TSC2*, VHL*. RNA analysis is  performed for * genes.   The test report has been scanned into EPIC and is located under the Molecular Pathology section of the Results Review tab.  A portion  of the result report is included below for reference. Genetic testing reported out on 10/28/2022.     Even though a pathogenic variant was not identified, possible explanations for the cancer in the family may include: There may be no hereditary risk for cancer in the family. The cancers in [name] and/or her family may be sporadic/familial or due to other genetic and environmental factors. There may be a gene mutation in one of these genes that current testing methods cannot detect but that chance is small. There could be another gene that has not yet been discovered, or that we have not yet tested, that is responsible for the cancer diagnoses in the family.  It is also possible there is a hereditary cause for the cancer in the family that [name] did not inherit.  Therefore, it is important to remain in touch with cancer genetics in the future so that we can continue to offer [name] the most up to date genetic testing.  ADDITIONAL GENETIC TESTING:  We discussed with [name] that her genetic testing was fairly extensive.  If there are additional relevant genes identified to increase cancer risk that can be analyzed in the future, we would be happy to discuss and coordinate this testing at that time.    CANCER SCREENING RECOMMENDATIONS:  [name] test result is considered negative (normal).  This means that we have not identified a hereditary cause for her personal and family Hart of cancer at this time.   An individual's cancer risk and medical management are not determined by genetic test results alone. Overall cancer risk assessment incorporates additional factors, including personal medical Hart, family Hart, and any available genetic information that may result in a personalized plan for cancer prevention and surveillance. Therefore, it is recommended she continue to follow the cancer management and screening guidelines provided by her oncology and primary healthcare  provider.  RECOMMENDATIONS FOR FAMILY MEMBERS:   Since she did not inherit a identifiable mutation in a cancer predisposition gene included on this panel, her children could not have inherited a known mutation from her in one of these genes. Individuals in this family might be at some increased risk of developing cancer, over the general population risk, due to the family Hart of cancer.  Individuals in the family should notify their providers of the family Hart of cancer. We recommend women in this family have a yearly mammogram beginning at age 65, or 67 years younger than the earliest onset of cancer, an annual clinical breast exam, and perform monthly breast self-exams.  Family members should have colonoscopies by at age 40, or earlier, as recommended by their providers. Other members of the family may still carry a pathogenic variant in one of these genes that [name] did not inherit. Based on the family Hart, we recommend those related to her cousin who had breast cancer at 76 have genetic counseling and testing. [name] will let us know if we can be of any assistance in coordinating genetic counseling and/or testing for this family member.    FOLLOW-UP:  Lastly, we discussed with [name] that cancer genetics is a rapidly advancing field and it is possible that new genetic tests will be appropriate for her and/or her family members in the future. We encouraged her to remain  in contact with cancer genetics on an annual basis so we can update her personal and family histories and let her know of advances in cancer genetics that may benefit this family.   Our contact number was provided. [name] questions were answered to her satisfaction, and she knows she is welcome to call us at anytime with additional questions or concerns.    Lacy Duverney, MS, [facility] Genetic Counselor [email or web address] Phone: [phone]

## 2022-11-01 ENCOUNTER — Inpatient Hospital Stay: Payer: Self-pay | Attending: Oncology | Admitting: Oncology

## 2022-11-01 ENCOUNTER — Encounter: Payer: Self-pay | Admitting: *Deleted

## 2022-11-01 ENCOUNTER — Encounter: Payer: Self-pay | Admitting: Oncology

## 2022-11-01 VITALS — BP 119/80 | HR 88 | Temp 98.9°F | Resp 16 | Wt 121.5 lb

## 2022-11-01 DIAGNOSIS — Z7189 Other specified counseling: Secondary | ICD-10-CM

## 2022-11-01 DIAGNOSIS — Z5189 Encounter for other specified aftercare: Secondary | ICD-10-CM | POA: Diagnosis not present

## 2022-11-01 DIAGNOSIS — Z5111 Encounter for antineoplastic chemotherapy: Secondary | ICD-10-CM | POA: Diagnosis present

## 2022-11-01 DIAGNOSIS — Z171 Estrogen receptor negative status [ER-]: Secondary | ICD-10-CM | POA: Diagnosis not present

## 2022-11-01 DIAGNOSIS — C50411 Malignant neoplasm of upper-outer quadrant of right female breast: Secondary | ICD-10-CM | POA: Insufficient documentation

## 2022-11-01 NOTE — Progress Notes (Signed)
Met with patient and family at follow up appt. With Dr. Smith Robert.

## 2022-11-04 ENCOUNTER — Encounter: Payer: Self-pay | Admitting: Oncology

## 2022-11-04 ENCOUNTER — Inpatient Hospital Stay: Payer: Medicaid Other

## 2022-11-04 MED ORDER — LIDOCAINE-PRILOCAINE 2.5-2.5 % EX CREA
TOPICAL_CREAM | CUTANEOUS | 3 refills | Status: DC
Start: 2022-11-04 — End: 2023-04-23

## 2022-11-04 MED ORDER — ONDANSETRON HCL 8 MG PO TABS
ORAL_TABLET | ORAL | 1 refills | Status: DC
Start: 2022-11-04 — End: 2023-04-23

## 2022-11-04 MED ORDER — PROCHLORPERAZINE MALEATE 10 MG PO TABS
10.0000 mg | ORAL_TABLET | Freq: Four times a day (QID) | ORAL | 1 refills | Status: DC | PRN
Start: 2022-11-04 — End: 2023-04-23

## 2022-11-04 MED ORDER — DEXAMETHASONE 4 MG PO TABS
ORAL_TABLET | ORAL | 1 refills | Status: DC
Start: 2022-11-04 — End: 2023-04-23

## 2022-11-04 NOTE — Progress Notes (Signed)
START ON PATHWAY REGIMEN - Breast     Cycles 1 through 4: A cycle is every 14 days:     Doxorubicin      Cyclophosphamide      Pegfilgrastim-xxxx    Cycles 5 through 16: A cycle is every 7 days:     Paclitaxel   **Always confirm dose/schedule in your pharmacy ordering system**  Patient Characteristics: Preoperative or Nonsurgical Candidate, M0 (Clinical Staging), Up to cT4c, Any N, M0, Neoadjuvant Therapy followed by Surgery, Invasive Disease, Chemotherapy, HER2 Negative, ER Negative, Platinum Therapy Not Indicated Therapeutic Status: Preoperative or Nonsurgical Candidate, M0 (Clinical Staging) AJCC M Category: cM0 AJCC Grade: G3 ER Status: Negative (-) AJCC 8 Stage Grouping: IB HER2 Status: Negative (-) AJCC T Category: cT1c AJCC N Category: cN0 PR Status: Negative (-) Breast Surgical Plan: Neoadjuvant Therapy followed by Surgery Intent of Therapy: Curative Intent, Discussed with Patient

## 2022-11-05 ENCOUNTER — Other Ambulatory Visit: Payer: Self-pay

## 2022-11-06 ENCOUNTER — Ambulatory Visit
Admission: RE | Admit: 2022-11-06 | Discharge: 2022-11-06 | Disposition: A | Discharge status: Home / Self Care | Payer: Medicaid Other | Source: Ambulatory Visit | Attending: Oncology | Admitting: Oncology

## 2022-11-06 DIAGNOSIS — C50411 Malignant neoplasm of upper-outer quadrant of right female breast: Secondary | ICD-10-CM | POA: Diagnosis present

## 2022-11-06 DIAGNOSIS — Z171 Estrogen receptor negative status [ER-]: Secondary | ICD-10-CM | POA: Insufficient documentation

## 2022-11-06 LAB — GLUCOSE, CAPILLARY: Glucose-Capillary: 82 mg/dL (ref 70–99)

## 2022-11-06 MED ORDER — FLUDEOXYGLUCOSE F - 18 (FDG) INJECTION
6.7300 | Freq: Once | INTRAVENOUS | Status: AC | PRN
  Administered 2022-11-06: 6.73 via INTRAVENOUS

## 2022-11-07 ENCOUNTER — Inpatient Hospital Stay: Payer: Medicaid Other

## 2022-11-07 MED FILL — Dexamethasone Sodium Phosphate Inj 100 MG/10ML: INTRAMUSCULAR | Qty: 1 | Status: AC

## 2022-11-08 ENCOUNTER — Inpatient Hospital Stay: Payer: Medicaid Other

## 2022-11-08 ENCOUNTER — Encounter: Payer: Self-pay | Admitting: *Deleted

## 2022-11-08 ENCOUNTER — Inpatient Hospital Stay (HOSPITAL_BASED_OUTPATIENT_CLINIC_OR_DEPARTMENT_OTHER): Payer: Medicaid Other | Admitting: Oncology

## 2022-11-08 ENCOUNTER — Encounter: Payer: Self-pay | Admitting: Oncology

## 2022-11-08 VITALS — BP 121/74 | HR 97 | Temp 98.7°F | Resp 20 | Wt 122.6 lb

## 2022-11-08 DIAGNOSIS — Z5111 Encounter for antineoplastic chemotherapy: Secondary | ICD-10-CM | POA: Diagnosis not present

## 2022-11-08 DIAGNOSIS — C50411 Malignant neoplasm of upper-outer quadrant of right female breast: Secondary | ICD-10-CM

## 2022-11-08 DIAGNOSIS — Z171 Estrogen receptor negative status [ER-]: Secondary | ICD-10-CM | POA: Diagnosis not present

## 2022-11-08 LAB — CBC WITH DIFFERENTIAL (CANCER CENTER ONLY)
Abs Immature Granulocytes: 0.02 10*3/uL (ref 0.00–0.07)
Basophils Absolute: 0.1 10*3/uL (ref 0.0–0.1)
Basophils Relative: 1 %
Eosinophils Absolute: 0.1 10*3/uL (ref 0.0–0.5)
Eosinophils Relative: 1 %
HCT: 39 % (ref 36.0–46.0)
Hemoglobin: 12.7 g/dL (ref 12.0–15.0)
Immature Granulocytes: 0 %
Lymphocytes Relative: 37 %
Lymphs Abs: 2.7 10*3/uL (ref 0.7–4.0)
MCH: 28.1 pg (ref 26.0–34.0)
MCHC: 32.6 g/dL (ref 30.0–36.0)
MCV: 86.3 fL (ref 80.0–100.0)
Monocytes Absolute: 0.5 10*3/uL (ref 0.1–1.0)
Monocytes Relative: 6 %
Neutro Abs: 4.1 10*3/uL (ref 1.7–7.7)
Neutrophils Relative %: 55 %
Platelet Count: 261 10*3/uL (ref 150–400)
RBC: 4.52 MIL/uL (ref 3.87–5.11)
RDW: 13.3 % (ref 11.5–15.5)
WBC Count: 7.5 10*3/uL (ref 4.0–10.5)
nRBC: 0 % (ref 0.0–0.2)

## 2022-11-08 LAB — CMP (CANCER CENTER ONLY)
ALT: 8 U/L (ref 0–44)
AST: 17 U/L (ref 15–41)
Albumin: 4.3 g/dL (ref 3.5–5.0)
Alkaline Phosphatase: 48 U/L (ref 38–126)
Anion gap: 10 (ref 5–15)
BUN: 8 mg/dL (ref 6–20)
CO2: 21 mmol/L — ABNORMAL LOW (ref 22–32)
Calcium: 8.9 mg/dL (ref 8.9–10.3)
Chloride: 106 mmol/L (ref 98–111)
Creatinine: 0.61 mg/dL (ref 0.44–1.00)
GFR, Estimated: >60 mL/min (ref >60)
Glucose, Bld: 109 mg/dL — ABNORMAL HIGH (ref 70–99)
Potassium: 3.5 mmol/L (ref 3.5–5.1)
Sodium: 137 mmol/L (ref 135–145)
Total Bilirubin: 0.7 mg/dL (ref 0.3–1.2)
Total Protein: 7.1 g/dL (ref 6.5–8.1)

## 2022-11-08 MED ORDER — SODIUM CHLORIDE 0.9 % IV SOLN
Freq: Once | INTRAVENOUS | Status: AC
  Filled 2022-11-08: qty 250

## 2022-11-08 MED ORDER — SODIUM CHLORIDE 0.9 % IV SOLN
150.0000 mg | Freq: Once | INTRAVENOUS | Status: AC
  Administered 2022-11-08: 150 mg via INTRAVENOUS
  Filled 2022-11-08: qty 150

## 2022-11-08 MED ORDER — HEPARIN SOD (PORK) LOCK FLUSH 100 UNIT/ML IV SOLN
500.0000 [IU] | Freq: Once | INTRAVENOUS | Status: AC | PRN
  Administered 2022-11-08: 500 [IU]
  Filled 2022-11-08: qty 5

## 2022-11-08 MED ORDER — SODIUM CHLORIDE 0.9 % IV SOLN
600.0000 mg/m2 | Freq: Once | INTRAVENOUS | Status: AC
  Administered 2022-11-08: 1000 mg via INTRAVENOUS
  Filled 2022-11-08: qty 50

## 2022-11-08 MED ORDER — DOXORUBICIN HCL CHEMO IV INJECTION 2 MG/ML
60.0000 mg/m2 | Freq: Once | INTRAVENOUS | Status: AC
  Administered 2022-11-08: 94 mg via INTRAVENOUS
  Filled 2022-11-08: qty 47

## 2022-11-08 MED ORDER — SODIUM CHLORIDE 0.9 % IV SOLN
10.0000 mg | Freq: Once | INTRAVENOUS | Status: AC
  Administered 2022-11-08: 10 mg via INTRAVENOUS
  Filled 2022-11-08: qty 10

## 2022-11-08 MED ORDER — PALONOSETRON HCL INJECTION 0.25 MG/5ML
0.2500 mg | Freq: Once | INTRAVENOUS | Status: AC
  Administered 2022-11-08: 0.25 mg via INTRAVENOUS
  Filled 2022-11-08: qty 5

## 2022-11-08 NOTE — Progress Notes (Signed)
Hematology/Oncology Consult note [facility]  Telephone:([phone] [phone]  Patient Care Team: Glori Luis, MD as PCP - General (Family Medicine) Hulen Luster, RN as Oncology Nurse Navigator   Name of the patient: Eileen Hart  [id number]  [birth year]   Date of visit: 11/08/22  Diagnosis-clinical prognostic stage Ib invasive mammary carcinoma of the right breast cT1 cN0 M0 triple negative  Chief complaint/ Reason for visit-on treatment assessment prior to cycle 1 of neoadjuvant AC chemotherapy  Heme/Onc history: Patient is a 32 year old female who underwent a bilateral diagnostic mammogram on 10/07/2022 after she felt a palpable area of concern in her right breast. Mammogram showed a 1.6 x 1 x 1.5 cm solid mass in the right breast 9:30 position 5 cm from the nipple. There was also an adjacent 6 x 9 x 2 mm mass in the right breast. No right axillary adenopathy. The dominant mass was biopsied and was consistent with invasive mammary carcinoma grade 3 ER and HER2 negative.   Bilateral breast MRI showed additional areas of concern in the right breast.  2 additional enhancing masses 1 in the 12 o'clock position and 1 in the 4 o'clock position.  2 adjacent prominent right axillary lymph nodes.  2 intermittent masses in the left breast 9 o'clock position and upper outer left breast.  This was followed by a dedicated ultrasound.  She had 3 breast biopsies in the right side and 1 left breast biopsy and all of them were negative for malignancy.  On the ultrasound her right axillary lymph nodes appeared normal and therefore biopsy was not recommended for the same.  PET CT scan showed focal hypermetabolic activity in the right breast adjacent to the biopsy clip but no other additional areas of concern.  Baseline MUGA scan showed normal EF.  Interval history-she feels well overall today and denies any specific complaints at this time  ECOG PS- 0 Pain scale-  0  Review of systems- Review of Systems  Constitutional:  Negative for chills, fever, malaise/fatigue and weight loss.  HENT:  Negative for congestion, ear discharge and nosebleeds.   Eyes:  Negative for blurred vision.  Respiratory:  Negative for cough, hemoptysis, sputum production, shortness of breath and wheezing.   Cardiovascular:  Negative for chest pain, palpitations, orthopnea and claudication.  Gastrointestinal:  Negative for abdominal pain, blood in stool, constipation, diarrhea, heartburn, melena, nausea and vomiting.  Genitourinary:  Negative for dysuria, flank pain, frequency, hematuria and urgency.  Musculoskeletal:  Negative for back pain, joint pain and myalgias.  Skin:  Negative for rash.  Neurological:  Negative for dizziness, tingling, focal weakness, seizures, weakness and headaches.  Endo/Heme/Allergies:  Does not bruise/bleed easily.  Psychiatric/Behavioral:  Negative for depression and suicidal ideas. The patient does not have insomnia.       No Known Allergies   Past Medical History:  Diagnosis Date   Chlamydia 01/25/2010   Depression with anxiety    Gestational hypertension    Immunization, viral disease    gardasil series completed   Tobacco abuse 05/02/2016     Past Surgical History:  Procedure Laterality Date   AUGMENTATION MAMMAPLASTY Bilateral    saline implants 2015   BREAST BIOPSY Right 10/14/2022   US biopsy/ coil clip/ path pending   BREAST BIOPSY Right 10/14/2022   [place] RT BREAST BX W LOC DEV 1ST LESION IMG BX SPEC US GUIDE 10/14/2022 [facility]   BREAST BIOPSY Left 10/29/2022   [place] Core Bx Ribbon  clip- path pending   BREAST BIOPSY Right 10/29/2022   [place] Core 1130 Venus Clip- path pending   BREAST BIOPSY Right 10/29/2022   [place] Core Bx Retroareolar heart clip-path pending   BREAST BIOPSY Right 10/29/2022   [place] Core Bx Ribbon Clip path pending   BREAST BIOPSY Right 10/29/2022   [place] RT BREAST BX W LOC DEV EA ADD LESION IMG BX SPEC US GUIDE  10/29/2022 [facility]   BREAST BIOPSY Right 10/29/2022   [place] RT BREAST BX W LOC DEV 1ST LESION IMG BX SPEC US GUIDE 10/29/2022 [facility]   BREAST BIOPSY Right 10/29/2022   [place] RT BREAST BX W LOC DEV EA ADD LESION IMG BX SPEC US GUIDE 10/29/2022 [facility]   BREAST BIOPSY Left 10/29/2022   [place] LT BREAST BX W LOC DEV 1ST LESION IMG BX SPEC US GUIDE 10/29/2022 [facility]   BREAST ENHANCEMENT SURGERY  2015   CESAREAN SECTION  2014   PORTACATH PLACEMENT N/A 10/24/2022   Procedure: INSERTION PORT-A-CATH;  Surgeon: Leafy Ro, MD;  Location: [facility];  Service: General;  Laterality: N/A;   TONSILLECTOMY      Social History   Socioeconomic History   Marital status: Single    Spouse name: Not on file   Number of children: 1   Years of education: 15   Highest education level: Not on file  Occupational History   Occupation: Groomer    Comment: [employer]  Tobacco Use   Smoking status: Former    Packs/day: .5    Types: Cigarettes    Quit date: 07/25/2018    Years since quitting: 4.2   Smokeless tobacco: Never   Tobacco comments:    quit 07/2018  Vaping Use   Vaping Use: Never used  Substance and Sexual Activity   Alcohol use: No    Alcohol/week: 0.0 standard drinks of alcohol   Drug use: No   Sexual activity: Yes    Birth control/protection: Pill  Other Topics Concern   Not on file  Social History Narrative   Not on file   Social Determinants of Health   Financial Resource Strain: Not on file  Food Insecurity: No Food Insecurity (10/22/2022)   Hunger Vital Sign    Worried About Running Out of Food in the Last Year: Never true    Ran Out of Food in the Last Year: Never true  Transportation Needs: No Transportation Needs (10/22/2022)   PRAPARE - Administrator, Civil Service (Medical): No    Lack of Transportation (Non-Medical): No  Physical Activity: Not on file  Stress: Not on file  Social Connections: Not on file  Intimate Partner Violence:  Not At Risk (10/22/2022)   Humiliation, Afraid, Rape, and Kick questionnaire    Fear of Current or Ex-Partner: No    Emotionally Abused: No    Physically Abused: No    Sexually Abused: No    Family History  Problem Relation Age of Onset   Breast cancer Other 35       mat 2nd cousin   Diabetes Neg Hx    Heart disease Neg Hx    Hypertension Neg Hx    Ovarian cancer Neg Hx    Colon cancer Neg Hx      Current Outpatient Medications:    dexamethasone (DECADRON) 4 MG tablet, Take 2 tablets (8 mg total) by mouth daily for 3 days. Start the day after doxorubicin/cyclophosphamide chemotherapy. Take with food., Disp: 30 tablet, Rfl: 1   escitalopram (  LEXAPRO) 10 MG tablet, TAKE 1 TABLET BY MOUTH EVERY DAY, Disp: 90 tablet, Rfl: 2   HYDROcodone-acetaminophen (NORCO/VICODIN) 5-325 MG tablet, Take 1-2 tablets by mouth every 6 (six) hours as needed for moderate pain., Disp: 15 tablet, Rfl: 0   lidocaine-prilocaine (EMLA) cream, Apply to affected area once, Disp: 30 g, Rfl: 3   norgestimate-ethinyl estradiol (ORTHO-CYCLEN) 0.25-35 MG-MCG tablet, Take 1 tablet by mouth daily., Disp: 84 tablet, Rfl: 3   ondansetron (ZOFRAN) 8 MG tablet, Take 1 tab (8 mg) by mouth every 8 hrs as needed for nausea/vomiting. Start third day after doxorubicin/cyclophosphamide chemotherapy., Disp: 30 tablet, Rfl: 1   prochlorperazine (COMPAZINE) 10 MG tablet, Take 1 tablet (10 mg total) by mouth every 6 (six) hours as needed for nausea or vomiting., Disp: 30 tablet, Rfl: 1 No current facility-administered medications for this visit.  Facility-Administered Medications Ordered in Other Visits:    heparin lock flush 100 unit/mL, 500 Units, Intracatheter, Once PRN, Creig Hines, MD  Physical exam:  Vitals:   11/08/22 1051  BP: 121/74  Pulse: 97  Resp: 20  Temp: 98.7 F (37.1 C)  SpO2: 100%  Weight: 122 lb 9.6 oz (55.6 kg)   Physical Exam Cardiovascular:     Rate and Rhythm: Normal rate and regular rhythm.      Heart sounds: Normal heart sounds.  Pulmonary:     Effort: Pulmonary effort is normal.     Breath sounds: Normal breath sounds.  Abdominal:     General: Bowel sounds are normal.     Palpations: Abdomen is soft.  Skin:    General: Skin is warm and dry.  Neurological:     Mental Status: She is alert and oriented to person, place, and time.         Latest Ref Rng & Units 11/08/2022   10:46 AM  CMP  Glucose 70 - 99 mg/dL 161   BUN 6 - 20 mg/dL 8   Creatinine 0.96 - 0.45 mg/dL 4.09   Sodium 811 - 914 mmol/L 137   Potassium 3.5 - 5.1 mmol/L 3.5   Chloride 98 - 111 mmol/L 106   CO2 22 - 32 mmol/L 21   Calcium 8.9 - 10.3 mg/dL 8.9   Total Protein 6.5 - 8.1 g/dL 7.1   Total Bilirubin 0.3 - 1.2 mg/dL 0.7   Alkaline Phos 38 - 126 U/L 48   AST 15 - 41 U/L 17   ALT 0 - 44 U/L 8       Latest Ref Rng & Units 11/08/2022   10:46 AM  CBC  WBC 4.0 - 10.5 K/uL 7.5   Hemoglobin 12.0 - 15.0 g/dL 78.2   Hematocrit 95.6 - 46.0 % 39.0   Platelets 150 - 400 K/uL 261     No images are attached to the encounter.  NM PET Image Initial (PI) Skull Base To Thigh  Result Date: 11/06/2022 CLINICAL DATA:  Initial treatment strategy for newly diagnosed right breast cancer. EXAM: NUCLEAR MEDICINE PET SKULL BASE TO THIGH TECHNIQUE: 6.73 mCi F-18 FDG was injected intravenously. Full-ring PET imaging was performed from the skull base to thigh after the radiotracer. CT data was obtained and used for attenuation correction and anatomic localization. Fasting blood glucose: 82 mg/dl COMPARISON:  No prior relevant cross-sectional imaging. Recent right breast imaging is correlated. FINDINGS: Mediastinal blood pool activity: SUV max 1.6 NECK: No hypermetabolic cervical lymph nodes are identified.Fairly symmetric activity within the lymphoid tissue of Waldeyer's ring is within physiologic limits.No  suspicious activity identified within the pharyngeal mucosal space. Incidental CT findings: none CHEST: There are no  hypermetabolic mediastinal, hilar, axillary or internal mammary lymph nodes. There is focal hypermetabolic activity laterally in the right breast adjacent to a biopsy clip, demonstrating an SUV max of 4.3. No other hypermetabolic activity is seen within the breasts or chest wall. No hypermetabolic pulmonary activity or suspicious nodularity. Incidental CT findings: Left IJ Port-A-Cath extends to the superior cavoatrial junction. Small amount of residual thymic tissue in the anterior mediastinum. There are bilateral subpectoral breast implants. ABDOMEN/PELVIS: There is no hypermetabolic activity within the liver, adrenal glands, spleen or pancreas. There is no hypermetabolic nodal activity in the abdomen or pelvis. There is tubular activity in the pelvis bilaterally. On the right, this is posteriorly located and may relate to the right ureter or adnexa. On the left, this is anteriorly located and appears adnexal within SUV max of 16.3. Incidental CT findings: Possible punctate renal calculi bilaterally. No hydronephrosis, ascites or peritoneal nodularity. SKELETON: There is no hypermetabolic activity to suggest osseous metastatic disease. Incidental CT findings: none IMPRESSION: 1. Focal hypermetabolic activity laterally in the right breast consistent with known breast cancer. 2. No evidence of metastatic disease. 3. Indeterminate tubular activity in the pelvis bilaterally, likely physiologic. Consider further evaluation with pelvic ultrasound, as clinically warranted. 4. Possible punctate renal calculi bilaterally. Electronically Signed   By: Carey Bullocks M.D.   On: 11/06/2022 16:39   [place] RT BREAST BX W LOC DEV 1ST LESION IMG BX SPEC US GUIDE  Addendum Date: 11/04/2022   ADDENDUM REPORT: 11/04/2022 12:43 ADDENDUM: PATHOLOGY revealed: BREAST, RIGHT 9:30 5 CMFN; ULTRASOUND-GUIDED BIOPSY: BENIGN BREAST TISSUE WITH FIBROUS STROMA, SCLEROSING ADENOSIS, AND FOCAL STROMAL HEMORRHAGE. NEGATIVE FOR ATYPIA AND  MALIGNANCY. Pathology results are CONCORDANT with imaging findings, per Dr. Harmon Pier. PATHOLOGY revealed: BREAST, RIGHT 11:30 5 CM FN; ULTRASOUND-GUIDED BIOPSY: FIBROADENOMA. BACKGROUND BENIGN BREAST TISSUE WITH FOCAL LACTATIONAL CHANGE. NEGATIVE FOR ATYPIA AND MALIGNANCY. Pathology results are CONCORDANT with imaging findings, per Dr. Harmon Pier. PATHOLOGY revealed: BREAST, RIGHT RETROAREOLAR 3:00; ULTRASOUND-GUIDED BIOPSY: BENIGN BREAST TISSUE WITH FIBROUS STROMA, SCLEROSING ADENOSIS, AND FOCAL STROMAL HEMORRHAGE. NEGATIVE FOR ATYPIA AND MALIGNANCY. Pathology results are CONCORDANT with imaging findings, per Dr. Harmon Pier. PATHOLOGY revealed: BREAST, LEFT 9:00 4 CM FN; ULTRASOUND-GUIDED BIOPSY: FIBROADENOMA. NEGATIVE FOR ATYPIA AND MALIGNANCY. Pathology results are CONCORDANT with imaging findings, per Dr. Harmon Pier. Pathology results and recommendations below were discussed with patient by telephone on 10/30/22. Patient reported biopsy site within normal limits with slight tenderness at the site. Post biopsy care instructions were reviewed, questions were answered and my direct phone number was provided to patient. Patient was instructed to call [facility] if any concerns or questions arise related to the biopsy. RECOMMENDATION: Patient instructed to continue monthly self breast examinations and to return to screenings at the recommendation of her oncologist. Pathology results reported by Hezzie Bump, RN 10/30/2022. Electronically Signed   By: Harmon Pier M.D.   On: 11/04/2022 12:43   Result Date: 11/04/2022 CLINICAL DATA:  32 year old female with newly diagnosed RIGHT breast cancer. Patient presents for tissue sampling of 3 RIGHT breast masses and 1 LEFT breast mass (RIGHT breast mass identified on 10/07/2022 ultrasound -9:30 position mass, as well as 2 additional RIGHT breast masses and 1 LEFT breast mass identified on 2nd look ultrasound from MR findings. EXAM: ULTRASOUND GUIDED RIGHT  BREAST CORE NEEDLE BIOPSY X 3 ULTRASOUND GUIDED LEFT BREAST CORE NEEDLE BIOPSY COMPARISON:  Previous exam(s). PROCEDURE: I  met with the patient and we discussed the procedure of ultrasound-guided biopsy, including benefits and alternatives. We discussed the high likelihood of a successful procedure. We discussed the risks of the procedure, including moderate chance of implant damage, infection, bleeding, tissue injury, clip migration, and inadequate sampling. Informed written consent was given. The usual time-out protocol was performed immediately prior to the procedure. ULTRASOUND GUIDED RIGHT BREAST CORE NEEDLE BIOPSY #1 (0.9 cm mass at 9:30 position-RIBBON clip): Lesion quadrant: UPPER-OUTER RIGHT breast Using sterile technique and 1% Lidocaine with and without epinephrine as local anesthetic, under direct ultrasound visualization, a 14 gauge spring-loaded device was used to perform biopsy of the 0.9 cm mass at the 9:30 position of the RIGHT breast using a LATERAL approach. At the conclusion of the procedure a RIBBON shaped tissue marker clip was deployed into the biopsy cavity. Follow up 2 view mammogram was performed and dictated separately. ULTRASOUND GUIDED RIGHT BREAST CORE NEEDLE BIOPSY #2 (1.2 cm mass at the 11:30 position-VENUS clip): Lesion quadrant: UPPER-OUTER RIGHT breast Using sterile technique and 1% Lidocaine with and without epinephrine as local anesthetic, under direct ultrasound visualization, a 14 gauge spring-loaded device was used to perform biopsy of the 1.2 cm mass at the 11:30 position of the RIGHT breast using a LATERAL approach. At the conclusion of the procedure a VENUS shaped tissue marker clip was deployed into the biopsy cavity. Follow up 2 view mammogram was performed and dictated separately. ULTRASOUND GUIDED RIGHT BREAST CORE NEEDLE BIOPSY #3 (0.7 cm 3 o'clock position RETROAREOLAR mass-HEART clip): Using sterile technique and 1% Lidocaine with and without epinephrine as local  anesthetic, under direct ultrasound visualization, a 14 gauge spring-loaded device was used to perform biopsy of the 0.7 cm mass at the 3 o'clock position of the RETROAREOLAR RIGHT breast using a SUPERIOR approach. At the conclusion of the procedure a HEART shaped tissue marker clip was deployed into the biopsy cavity. Follow up 2 view mammogram was performed and dictated separately. ULTRASOUND GUIDED LEFT BREAST CORE NEEDLE BIOPSY (0.9 cm 9 o'clock position mass-RIBBON clip): Using sterile technique and 1% Lidocaine with and without epinephrine as local anesthetic, under direct ultrasound visualization, a 14 gauge spring-loaded device was used to perform biopsy of the 0.9 cm mass at the 9 o'clock position of the LEFT breast using a MEDIAL approach. At the conclusion of the procedure RIBBON shaped tissue marker clip was deployed into the biopsy cavity. Follow up 2 view mammogram was performed and dictated separately. IMPRESSION: Ultrasound guided biopsy of 0.9 cm RIGHT breast mass at the 9:30 position (RIBBON clip). Ultrasound-guided biopsy of 1.2 cm RIGHT breast mass at the 11:30 position (VENUS clip). Ultrasound-guided biopsy 0.7 cm INNER RETROAREOLAR RIGHT breast mass (HEART clip). Ultrasound guided biopsy of 0.9 cm INNER LEFT breast mass (RIBBON clip). No apparent complications. Electronically Signed: By: Harmon Pier M.D. On: 10/29/2022 13:29   [place] LT BREAST BX W LOC DEV 1ST LESION IMG BX SPEC US GUIDE  Addendum Date: 11/04/2022   ADDENDUM REPORT: 11/04/2022 12:43 ADDENDUM: PATHOLOGY revealed: BREAST, RIGHT 9:30 5 CMFN; ULTRASOUND-GUIDED BIOPSY: BENIGN BREAST TISSUE WITH FIBROUS STROMA, SCLEROSING ADENOSIS, AND FOCAL STROMAL HEMORRHAGE. NEGATIVE FOR ATYPIA AND MALIGNANCY. Pathology results are CONCORDANT with imaging findings, per Dr. Harmon Pier. PATHOLOGY revealed: BREAST, RIGHT 11:30 5 CM FN; ULTRASOUND-GUIDED BIOPSY: FIBROADENOMA. BACKGROUND BENIGN BREAST TISSUE WITH FOCAL LACTATIONAL CHANGE. NEGATIVE FOR  ATYPIA AND MALIGNANCY. Pathology results are CONCORDANT with imaging findings, per Dr. Harmon Pier. PATHOLOGY revealed: BREAST, RIGHT RETROAREOLAR 3:00; ULTRASOUND-GUIDED BIOPSY: BENIGN  BREAST TISSUE WITH FIBROUS STROMA, SCLEROSING ADENOSIS, AND FOCAL STROMAL HEMORRHAGE. NEGATIVE FOR ATYPIA AND MALIGNANCY. Pathology results are CONCORDANT with imaging findings, per Dr. Harmon Pier. PATHOLOGY revealed: BREAST, LEFT 9:00 4 CM FN; ULTRASOUND-GUIDED BIOPSY: FIBROADENOMA. NEGATIVE FOR ATYPIA AND MALIGNANCY. Pathology results are CONCORDANT with imaging findings, per Dr. Harmon Pier. Pathology results and recommendations below were discussed with patient by telephone on 10/30/22. Patient reported biopsy site within normal limits with slight tenderness at the site. Post biopsy care instructions were reviewed, questions were answered and my direct phone number was provided to patient. Patient was instructed to call [facility] if any concerns or questions arise related to the biopsy. RECOMMENDATION: Patient instructed to continue monthly self breast examinations and to return to screenings at the recommendation of her oncologist. Pathology results reported by Hezzie Bump, RN 10/30/2022. Electronically Signed   By: Harmon Pier M.D.   On: 11/04/2022 12:43   Result Date: 11/04/2022 CLINICAL DATA:  32 year old female with newly diagnosed RIGHT breast cancer. Patient presents for tissue sampling of 3 RIGHT breast masses and 1 LEFT breast mass (RIGHT breast mass identified on 10/07/2022 ultrasound -9:30 position mass, as well as 2 additional RIGHT breast masses and 1 LEFT breast mass identified on 2nd look ultrasound from MR findings. EXAM: ULTRASOUND GUIDED RIGHT BREAST CORE NEEDLE BIOPSY X 3 ULTRASOUND GUIDED LEFT BREAST CORE NEEDLE BIOPSY COMPARISON:  Previous exam(s). PROCEDURE: I met with the patient and we discussed the procedure of ultrasound-guided biopsy, including benefits and alternatives. We discussed  the high likelihood of a successful procedure. We discussed the risks of the procedure, including moderate chance of implant damage, infection, bleeding, tissue injury, clip migration, and inadequate sampling. Informed written consent was given. The usual time-out protocol was performed immediately prior to the procedure. ULTRASOUND GUIDED RIGHT BREAST CORE NEEDLE BIOPSY #1 (0.9 cm mass at 9:30 position-RIBBON clip): Lesion quadrant: UPPER-OUTER RIGHT breast Using sterile technique and 1% Lidocaine with and without epinephrine as local anesthetic, under direct ultrasound visualization, a 14 gauge spring-loaded device was used to perform biopsy of the 0.9 cm mass at the 9:30 position of the RIGHT breast using a LATERAL approach. At the conclusion of the procedure a RIBBON shaped tissue marker clip was deployed into the biopsy cavity. Follow up 2 view mammogram was performed and dictated separately. ULTRASOUND GUIDED RIGHT BREAST CORE NEEDLE BIOPSY #2 (1.2 cm mass at the 11:30 position-VENUS clip): Lesion quadrant: UPPER-OUTER RIGHT breast Using sterile technique and 1% Lidocaine with and without epinephrine as local anesthetic, under direct ultrasound visualization, a 14 gauge spring-loaded device was used to perform biopsy of the 1.2 cm mass at the 11:30 position of the RIGHT breast using a LATERAL approach. At the conclusion of the procedure a VENUS shaped tissue marker clip was deployed into the biopsy cavity. Follow up 2 view mammogram was performed and dictated separately. ULTRASOUND GUIDED RIGHT BREAST CORE NEEDLE BIOPSY #3 (0.7 cm 3 o'clock position RETROAREOLAR mass-HEART clip): Using sterile technique and 1% Lidocaine with and without epinephrine as local anesthetic, under direct ultrasound visualization, a 14 gauge spring-loaded device was used to perform biopsy of the 0.7 cm mass at the 3 o'clock position of the RETROAREOLAR RIGHT breast using a SUPERIOR approach. At the conclusion of the procedure a HEART  shaped tissue marker clip was deployed into the biopsy cavity. Follow up 2 view mammogram was performed and dictated separately. ULTRASOUND GUIDED LEFT BREAST CORE NEEDLE BIOPSY (0.9 cm 9 o'clock position mass-RIBBON clip): Using sterile technique  and 1% Lidocaine with and without epinephrine as local anesthetic, under direct ultrasound visualization, a 14 gauge spring-loaded device was used to perform biopsy of the 0.9 cm mass at the 9 o'clock position of the LEFT breast using a MEDIAL approach. At the conclusion of the procedure RIBBON shaped tissue marker clip was deployed into the biopsy cavity. Follow up 2 view mammogram was performed and dictated separately. IMPRESSION: Ultrasound guided biopsy of 0.9 cm RIGHT breast mass at the 9:30 position (RIBBON clip). Ultrasound-guided biopsy of 1.2 cm RIGHT breast mass at the 11:30 position (VENUS clip). Ultrasound-guided biopsy 0.7 cm INNER RETROAREOLAR RIGHT breast mass (HEART clip). Ultrasound guided biopsy of 0.9 cm INNER LEFT breast mass (RIBBON clip). No apparent complications. Electronically Signed: By: Harmon Pier M.D. On: 10/29/2022 13:29   [place] RT BREAST BX W LOC DEV EA ADD LESION IMG BX SPEC US GUIDE  Addendum Date: 11/04/2022   ADDENDUM REPORT: 11/04/2022 12:43 ADDENDUM: PATHOLOGY revealed: BREAST, RIGHT 9:30 5 CMFN; ULTRASOUND-GUIDED BIOPSY: BENIGN BREAST TISSUE WITH FIBROUS STROMA, SCLEROSING ADENOSIS, AND FOCAL STROMAL HEMORRHAGE. NEGATIVE FOR ATYPIA AND MALIGNANCY. Pathology results are CONCORDANT with imaging findings, per Dr. Harmon Pier. PATHOLOGY revealed: BREAST, RIGHT 11:30 5 CM FN; ULTRASOUND-GUIDED BIOPSY: FIBROADENOMA. BACKGROUND BENIGN BREAST TISSUE WITH FOCAL LACTATIONAL CHANGE. NEGATIVE FOR ATYPIA AND MALIGNANCY. Pathology results are CONCORDANT with imaging findings, per Dr. Harmon Pier. PATHOLOGY revealed: BREAST, RIGHT RETROAREOLAR 3:00; ULTRASOUND-GUIDED BIOPSY: BENIGN BREAST TISSUE WITH FIBROUS STROMA, SCLEROSING ADENOSIS, AND FOCAL  STROMAL HEMORRHAGE. NEGATIVE FOR ATYPIA AND MALIGNANCY. Pathology results are CONCORDANT with imaging findings, per Dr. Harmon Pier. PATHOLOGY revealed: BREAST, LEFT 9:00 4 CM FN; ULTRASOUND-GUIDED BIOPSY: FIBROADENOMA. NEGATIVE FOR ATYPIA AND MALIGNANCY. Pathology results are CONCORDANT with imaging findings, per Dr. Harmon Pier. Pathology results and recommendations below were discussed with patient by telephone on 10/30/22. Patient reported biopsy site within normal limits with slight tenderness at the site. Post biopsy care instructions were reviewed, questions were answered and my direct phone number was provided to patient. Patient was instructed to call [facility] if any concerns or questions arise related to the biopsy. RECOMMENDATION: Patient instructed to continue monthly self breast examinations and to return to screenings at the recommendation of her oncologist. Pathology results reported by Hezzie Bump, RN 10/30/2022. Electronically Signed   By: Harmon Pier M.D.   On: 11/04/2022 12:43   Result Date: 11/04/2022 CLINICAL DATA:  32 year old female with newly diagnosed RIGHT breast cancer. Patient presents for tissue sampling of 3 RIGHT breast masses and 1 LEFT breast mass (RIGHT breast mass identified on 10/07/2022 ultrasound -9:30 position mass, as well as 2 additional RIGHT breast masses and 1 LEFT breast mass identified on 2nd look ultrasound from MR findings. EXAM: ULTRASOUND GUIDED RIGHT BREAST CORE NEEDLE BIOPSY X 3 ULTRASOUND GUIDED LEFT BREAST CORE NEEDLE BIOPSY COMPARISON:  Previous exam(s). PROCEDURE: I met with the patient and we discussed the procedure of ultrasound-guided biopsy, including benefits and alternatives. We discussed the high likelihood of a successful procedure. We discussed the risks of the procedure, including moderate chance of implant damage, infection, bleeding, tissue injury, clip migration, and inadequate sampling. Informed written consent was given. The  usual time-out protocol was performed immediately prior to the procedure. ULTRASOUND GUIDED RIGHT BREAST CORE NEEDLE BIOPSY #1 (0.9 cm mass at 9:30 position-RIBBON clip): Lesion quadrant: UPPER-OUTER RIGHT breast Using sterile technique and 1% Lidocaine with and without epinephrine as local anesthetic, under direct ultrasound visualization, a 14 gauge spring-loaded device was used to perform biopsy of  the 0.9 cm mass at the 9:30 position of the RIGHT breast using a LATERAL approach. At the conclusion of the procedure a RIBBON shaped tissue marker clip was deployed into the biopsy cavity. Follow up 2 view mammogram was performed and dictated separately. ULTRASOUND GUIDED RIGHT BREAST CORE NEEDLE BIOPSY #2 (1.2 cm mass at the 11:30 position-VENUS clip): Lesion quadrant: UPPER-OUTER RIGHT breast Using sterile technique and 1% Lidocaine with and without epinephrine as local anesthetic, under direct ultrasound visualization, a 14 gauge spring-loaded device was used to perform biopsy of the 1.2 cm mass at the 11:30 position of the RIGHT breast using a LATERAL approach. At the conclusion of the procedure a VENUS shaped tissue marker clip was deployed into the biopsy cavity. Follow up 2 view mammogram was performed and dictated separately. ULTRASOUND GUIDED RIGHT BREAST CORE NEEDLE BIOPSY #3 (0.7 cm 3 o'clock position RETROAREOLAR mass-HEART clip): Using sterile technique and 1% Lidocaine with and without epinephrine as local anesthetic, under direct ultrasound visualization, a 14 gauge spring-loaded device was used to perform biopsy of the 0.7 cm mass at the 3 o'clock position of the RETROAREOLAR RIGHT breast using a SUPERIOR approach. At the conclusion of the procedure a HEART shaped tissue marker clip was deployed into the biopsy cavity. Follow up 2 view mammogram was performed and dictated separately. ULTRASOUND GUIDED LEFT BREAST CORE NEEDLE BIOPSY (0.9 cm 9 o'clock position mass-RIBBON clip): Using sterile technique  and 1% Lidocaine with and without epinephrine as local anesthetic, under direct ultrasound visualization, a 14 gauge spring-loaded device was used to perform biopsy of the 0.9 cm mass at the 9 o'clock position of the LEFT breast using a MEDIAL approach. At the conclusion of the procedure RIBBON shaped tissue marker clip was deployed into the biopsy cavity. Follow up 2 view mammogram was performed and dictated separately. IMPRESSION: Ultrasound guided biopsy of 0.9 cm RIGHT breast mass at the 9:30 position (RIBBON clip). Ultrasound-guided biopsy of 1.2 cm RIGHT breast mass at the 11:30 position (VENUS clip). Ultrasound-guided biopsy 0.7 cm INNER RETROAREOLAR RIGHT breast mass (HEART clip). Ultrasound guided biopsy of 0.9 cm INNER LEFT breast mass (RIBBON clip). No apparent complications. Electronically Signed: By: Harmon Pier M.D. On: 10/29/2022 13:29   [place] RT BREAST BX W LOC DEV EA ADD LESION IMG BX SPEC US GUIDE  Addendum Date: 11/04/2022   ADDENDUM REPORT: 11/04/2022 12:43 ADDENDUM: PATHOLOGY revealed: BREAST, RIGHT 9:30 5 CMFN; ULTRASOUND-GUIDED BIOPSY: BENIGN BREAST TISSUE WITH FIBROUS STROMA, SCLEROSING ADENOSIS, AND FOCAL STROMAL HEMORRHAGE. NEGATIVE FOR ATYPIA AND MALIGNANCY. Pathology results are CONCORDANT with imaging findings, per Dr. Harmon Pier. PATHOLOGY revealed: BREAST, RIGHT 11:30 5 CM FN; ULTRASOUND-GUIDED BIOPSY: FIBROADENOMA. BACKGROUND BENIGN BREAST TISSUE WITH FOCAL LACTATIONAL CHANGE. NEGATIVE FOR ATYPIA AND MALIGNANCY. Pathology results are CONCORDANT with imaging findings, per Dr. Harmon Pier. PATHOLOGY revealed: BREAST, RIGHT RETROAREOLAR 3:00; ULTRASOUND-GUIDED BIOPSY: BENIGN BREAST TISSUE WITH FIBROUS STROMA, SCLEROSING ADENOSIS, AND FOCAL STROMAL HEMORRHAGE. NEGATIVE FOR ATYPIA AND MALIGNANCY. Pathology results are CONCORDANT with imaging findings, per Dr. Harmon Pier. PATHOLOGY revealed: BREAST, LEFT 9:00 4 CM FN; ULTRASOUND-GUIDED BIOPSY: FIBROADENOMA. NEGATIVE FOR ATYPIA AND  MALIGNANCY. Pathology results are CONCORDANT with imaging findings, per Dr. Harmon Pier. Pathology results and recommendations below were discussed with patient by telephone on 10/30/22. Patient reported biopsy site within normal limits with slight tenderness at the site. Post biopsy care instructions were reviewed, questions were answered and my direct phone number was provided to patient. Patient was instructed to call [facility] if any concerns or  questions arise related to the biopsy. RECOMMENDATION: Patient instructed to continue monthly self breast examinations and to return to screenings at the recommendation of her oncologist. Pathology results reported by Hezzie Bump, RN 10/30/2022. Electronically Signed   By: Harmon Pier M.D.   On: 11/04/2022 12:43   Result Date: 11/04/2022 CLINICAL DATA:  32 year old female with newly diagnosed RIGHT breast cancer. Patient presents for tissue sampling of 3 RIGHT breast masses and 1 LEFT breast mass (RIGHT breast mass identified on 10/07/2022 ultrasound -9:30 position mass, as well as 2 additional RIGHT breast masses and 1 LEFT breast mass identified on 2nd look ultrasound from MR findings. EXAM: ULTRASOUND GUIDED RIGHT BREAST CORE NEEDLE BIOPSY X 3 ULTRASOUND GUIDED LEFT BREAST CORE NEEDLE BIOPSY COMPARISON:  Previous exam(s). PROCEDURE: I met with the patient and we discussed the procedure of ultrasound-guided biopsy, including benefits and alternatives. We discussed the high likelihood of a successful procedure. We discussed the risks of the procedure, including moderate chance of implant damage, infection, bleeding, tissue injury, clip migration, and inadequate sampling. Informed written consent was given. The usual time-out protocol was performed immediately prior to the procedure. ULTRASOUND GUIDED RIGHT BREAST CORE NEEDLE BIOPSY #1 (0.9 cm mass at 9:30 position-RIBBON clip): Lesion quadrant: UPPER-OUTER RIGHT breast Using sterile technique and 1%  Lidocaine with and without epinephrine as local anesthetic, under direct ultrasound visualization, a 14 gauge spring-loaded device was used to perform biopsy of the 0.9 cm mass at the 9:30 position of the RIGHT breast using a LATERAL approach. At the conclusion of the procedure a RIBBON shaped tissue marker clip was deployed into the biopsy cavity. Follow up 2 view mammogram was performed and dictated separately. ULTRASOUND GUIDED RIGHT BREAST CORE NEEDLE BIOPSY #2 (1.2 cm mass at the 11:30 position-VENUS clip): Lesion quadrant: UPPER-OUTER RIGHT breast Using sterile technique and 1% Lidocaine with and without epinephrine as local anesthetic, under direct ultrasound visualization, a 14 gauge spring-loaded device was used to perform biopsy of the 1.2 cm mass at the 11:30 position of the RIGHT breast using a LATERAL approach. At the conclusion of the procedure a VENUS shaped tissue marker clip was deployed into the biopsy cavity. Follow up 2 view mammogram was performed and dictated separately. ULTRASOUND GUIDED RIGHT BREAST CORE NEEDLE BIOPSY #3 (0.7 cm 3 o'clock position RETROAREOLAR mass-HEART clip): Using sterile technique and 1% Lidocaine with and without epinephrine as local anesthetic, under direct ultrasound visualization, a 14 gauge spring-loaded device was used to perform biopsy of the 0.7 cm mass at the 3 o'clock position of the RETROAREOLAR RIGHT breast using a SUPERIOR approach. At the conclusion of the procedure a HEART shaped tissue marker clip was deployed into the biopsy cavity. Follow up 2 view mammogram was performed and dictated separately. ULTRASOUND GUIDED LEFT BREAST CORE NEEDLE BIOPSY (0.9 cm 9 o'clock position mass-RIBBON clip): Using sterile technique and 1% Lidocaine with and without epinephrine as local anesthetic, under direct ultrasound visualization, a 14 gauge spring-loaded device was used to perform biopsy of the 0.9 cm mass at the 9 o'clock position of the LEFT breast using a MEDIAL  approach. At the conclusion of the procedure RIBBON shaped tissue marker clip was deployed into the biopsy cavity. Follow up 2 view mammogram was performed and dictated separately. IMPRESSION: Ultrasound guided biopsy of 0.9 cm RIGHT breast mass at the 9:30 position (RIBBON clip). Ultrasound-guided biopsy of 1.2 cm RIGHT breast mass at the 11:30 position (VENUS clip). Ultrasound-guided biopsy 0.7 cm INNER RETROAREOLAR RIGHT breast mass (HEART clip).  Ultrasound guided biopsy of 0.9 cm INNER LEFT breast mass (RIBBON clip). No apparent complications. Electronically Signed: By: Harmon Pier M.D. On: 10/29/2022 13:29   MM CLIP PLACEMENT RIGHT  Result Date: 10/29/2022 CLINICAL DATA:  Evaluate placement of biopsy clips following bilateral ultrasound-guided breast biopsies. EXAM: 3D DIAGNOSTIC BILATERAL MAMMOGRAM POST ULTRASOUND BIOPSY COMPARISON:  Previous exam(s). FINDINGS: 3D Mammographic images were obtained following ultrasound guided biopsy of a 0.9 cm 9:30 position RIGHT breast mass (RIBBON clip), a 1.2 cm 11:30 position RIGHT breast mass (VENUS clip), a 0.7 cm INNER RETROAREOLAR RIGHT breast mass (HEART clip), and a 0.9 cm INNER LEFT breast mass (RIBBON clip). The RIBBON biopsy marking clip is in expected position at the site of biopsy within the UPPER-OUTER RIGHT breast. The VENUS biopsy marking clip is in expected position at the site of biopsy within the UPPER-OUTER RIGHT breast. The HEART biopsy marking clip is in expected position at the site of biopsy within the RETROAREOLAR RIGHT breast The RIBBON biopsy marking clip is in expected position at the site of biopsy within the INNER LEFT breast IMPRESSION: Appropriate positioning of the RIBBON shaped biopsy marking clip at the site of biopsy in the UPPER OUTER RIGHT breast (9:30 position RIGHT breast mass). Appropriate positioning of the VENUS biopsy marking clip at the site of biopsy within the UPPER-OUTER RIGHT breast (11:30 position RIGHT breast mass).  Appropriate positioning of the HEART biopsy marking clip at the site of biopsy within the RETROAREOLAR RIGHT breast (RETROAREOLAR RIGHT breast mass). Appropriate positioning of the RIBBON shaped biopsy marking clip at the site of biopsy within the INNER LEFT breast. Final Assessment: Post Procedure Mammograms for Marker Placement Electronically Signed   By: Harmon Pier M.D.   On: 10/29/2022 13:34  MM CLIP PLACEMENT LEFT  Result Date: 10/29/2022 CLINICAL DATA:  Evaluate placement of biopsy clips following bilateral ultrasound-guided breast biopsies. EXAM: 3D DIAGNOSTIC BILATERAL MAMMOGRAM POST ULTRASOUND BIOPSY COMPARISON:  Previous exam(s). FINDINGS: 3D Mammographic images were obtained following ultrasound guided biopsy of a 0.9 cm 9:30 position RIGHT breast mass (RIBBON clip), a 1.2 cm 11:30 position RIGHT breast mass (VENUS clip), a 0.7 cm INNER RETROAREOLAR RIGHT breast mass (HEART clip), and a 0.9 cm INNER LEFT breast mass (RIBBON clip). The RIBBON biopsy marking clip is in expected position at the site of biopsy within the UPPER-OUTER RIGHT breast. The VENUS biopsy marking clip is in expected position at the site of biopsy within the UPPER-OUTER RIGHT breast. The HEART biopsy marking clip is in expected position at the site of biopsy within the RETROAREOLAR RIGHT breast The RIBBON biopsy marking clip is in expected position at the site of biopsy within the INNER LEFT breast IMPRESSION: Appropriate positioning of the RIBBON shaped biopsy marking clip at the site of biopsy in the UPPER OUTER RIGHT breast (9:30 position RIGHT breast mass). Appropriate positioning of the VENUS biopsy marking clip at the site of biopsy within the UPPER-OUTER RIGHT breast (11:30 position RIGHT breast mass). Appropriate positioning of the HEART biopsy marking clip at the site of biopsy within the RETROAREOLAR RIGHT breast (RETROAREOLAR RIGHT breast mass). Appropriate positioning of the RIBBON shaped biopsy marking clip at the site  of biopsy within the INNER LEFT breast. Final Assessment: Post Procedure Mammograms for Marker Placement Electronically Signed   By: Harmon Pier M.D.   On: 10/29/2022 13:34  [place] LIMITED ULTRASOUND INCLUDING AXILLA LEFT BREAST   Result Date: 10/29/2022 CLINICAL DATA:  32 year old female with newly diagnosed RIGHT breast cancer. For 2nd-look  ultrasound following recent MRI demonstrating additional UPPER RIGHT breast mass, INNER RIGHT breast mass, UPPER OUTER LEFT breast mass and INNER LEFT breast mass. Patient had a previous LEFT breast ultrasound on 09/06/2019 demonstrating a mass at the 2 o'clock position. EXAM: ULTRASOUND OF THE BILATERAL BREAST COMPARISON:  09/06/2019 ultrasound.  10/25/2022 MRI. FINDINGS: Targeted ultrasound is performed, showing the following: RIGHT breast: A 1.2 x 0.4 x 0.9 cm circumscribed oval hypoechoic mass at the 11:30 position 7 cm from the nipple corresponding to the UPPER RIGHT breast mass identified on MR. A 0.6 x 0.7 x 0.3 cm hypoechoic mass at the 3 o'clock position of the RETROAREOLAR RIGHT breast corresponding to the INNER RIGHT breast mass identified on MR. LEFT breast: A 1 x 0.4 x 0.9 cm mass at the 2 o'clock position 7 cm from the nipple, unchanged from 2021 and compatible with a benign mass and corresponding to the UPPER-OUTER LEFT breast mass identified on MR. A 0.9 x 0.4 x 0.8 cm circumscribed oval hypoechoic mass at the 9 o'clock position 4 cm from the nipple and corresponding to the INNER LEFT breast mass identified on MR. No abnormal axillary lymph nodes are noted. IMPRESSION: 1. Indeterminate 1.2 cm UPPER RIGHT breast mass, indeterminate 0.7 cm INNER RETROAREOLAR RIGHT breast mass, and indeterminate 0.9 cm INNER LEFT breast mass, corresponding to the recent MR findings. Tissue sampling of all 3 masses recommended. 2. Stable 1 cm UPPER-OUTER LEFT breast mass corresponding to the MR finding. Given this is unchanged from 09/06/2019, this is considered benign and no further  follow-up recommended. RECOMMENDATION: Ultrasound-guided biopsies of indeterminate RIGHT breast masses and LEFT breast mass, which will be performed today but dictated in a separate report. I have discussed the findings and recommendations with the patient. If applicable, a reminder letter will be sent to the patient regarding the next appointment. BI-RADS CATEGORY  4: Suspicious. Electronically Signed   By: Harmon Pier M.D.   On: 10/29/2022 13:05  [place] LIMITED ULTRASOUND INCLUDING AXILLA RIGHT BREAST  Result Date: 10/29/2022 CLINICAL DATA:  32 year old female with newly diagnosed RIGHT breast cancer. For 2nd-look ultrasound following recent MRI demonstrating additional UPPER RIGHT breast mass, INNER RIGHT breast mass, UPPER OUTER LEFT breast mass and INNER LEFT breast mass. Patient had a previous LEFT breast ultrasound on 09/06/2019 demonstrating a mass at the 2 o'clock position. EXAM: ULTRASOUND OF THE BILATERAL BREAST COMPARISON:  09/06/2019 ultrasound.  10/25/2022 MRI. FINDINGS: Targeted ultrasound is performed, showing the following: RIGHT breast: A 1.2 x 0.4 x 0.9 cm circumscribed oval hypoechoic mass at the 11:30 position 7 cm from the nipple corresponding to the UPPER RIGHT breast mass identified on MR. A 0.6 x 0.7 x 0.3 cm hypoechoic mass at the 3 o'clock position of the RETROAREOLAR RIGHT breast corresponding to the INNER RIGHT breast mass identified on MR. LEFT breast: A 1 x 0.4 x 0.9 cm mass at the 2 o'clock position 7 cm from the nipple, unchanged from 2021 and compatible with a benign mass and corresponding to the UPPER-OUTER LEFT breast mass identified on MR. A 0.9 x 0.4 x 0.8 cm circumscribed oval hypoechoic mass at the 9 o'clock position 4 cm from the nipple and corresponding to the INNER LEFT breast mass identified on MR. No abnormal axillary lymph nodes are noted. IMPRESSION: 1. Indeterminate 1.2 cm UPPER RIGHT breast mass, indeterminate 0.7 cm INNER RETROAREOLAR RIGHT breast mass, and  indeterminate 0.9 cm INNER LEFT breast mass, corresponding to the recent MR findings. Tissue sampling of all 3  masses recommended. 2. Stable 1 cm UPPER-OUTER LEFT breast mass corresponding to the MR finding. Given this is unchanged from 09/06/2019, this is considered benign and no further follow-up recommended. RECOMMENDATION: Ultrasound-guided biopsies of indeterminate RIGHT breast masses and LEFT breast mass, which will be performed today but dictated in a separate report. I have discussed the findings and recommendations with the patient. If applicable, a reminder letter will be sent to the patient regarding the next appointment. BI-RADS CATEGORY  4: Suspicious. Electronically Signed   By: Harmon Pier M.D.   On: 10/29/2022 13:05  NM Cardiac Muga Rest  Result Date: 10/28/2022 CLINICAL DATA:  RIGHT breast cancer, pre cardiotoxic chemotherapy EXAM: NUCLEAR MEDICINE CARDIAC BLOOD POOL IMAGING (MUGA) TECHNIQUE: Cardiac multi-gated acquisition was performed at rest following intravenous injection of Tc-10m labeled red blood cells. RADIOPHARMACEUTICALS:  21.94 mCi Tc-11m pertechnetate in-vitro labeled red blood cells IV COMPARISON:  None Available. FINDINGS: Calculated LEFT ventricular ejection fraction is 62.9%, normal. Study was obtained at a cardiac rate of 68 bpm. Patient was arrhythmic during imaging. Cine analysis of the LEFT ventricle in 3 projections demonstrates normal LV wall motion. IMPRESSION: Normal LEFT ventricular ejection fraction of 62.9% with normal LV wall motion. Electronically Signed   By: Ulyses Southward M.D.   On: 10/28/2022 13:25   MR Breast Bilateral W Wo Contrast  Result Date: 10/25/2022 CLINICAL DATA:  Patient with recent diagnosis right breast cancer. EXAM: BILATERAL BREAST MRI WITH AND WITHOUT CONTRAST TECHNIQUE: Multiplanar, multisequence MR images of both breasts were obtained prior to and following the intravenous administration of 5 ml of Gadavist Three-dimensional MR images were  rendered by post-processing of the original MR data on an independent workstation. The three-dimensional MR images were interpreted, and findings are reported in the following complete MRI report for this study. Three dimensional images were evaluated at the independent interpreting workstation using the DynaCAD thin client. COMPARISON:  Previous exam(s). FINDINGS: Breast composition: d. Extreme fibroglandular tissue. Background parenchymal enhancement: Mild Right breast: Within the 12 o'clock position right breast there is a 0.9 cm lobular enhancing mass. Within the 4 o'clock position left breast there is a 1.1 cm enhancing mass. Within the lower outer right breast there is a 1.7 x 1.0 cm enhancing mass compatible with biopsy-proven right breast carcinoma. Retropectoral implants. Left breast: Within the medial left breast there is a 1.1 cm oval enhancing mass. Within the upper-outer left breast there is a 0.8 cm oval enhancing mass. No additional concerning areas of enhancement identified within the left breast. Retropectoral implants. Lymph nodes: Two adjacent prominent right axillary lymph nodes measuring up to 5 mm in thickness. Ancillary findings:  Port-A-Cath. IMPRESSION: 1. Biopsy-proven right breast malignancy. 2. Within the right breast there are 2 additional enhancing masses. One is located in the 12 o'clock position and the other mass is located within the 4 o'clock position. 3. Two indeterminate masses within the left breast with one mass at the 9 o'clock position and other mass within the upper-outer left breast. 4. Two adjacent prominent right axillary lymph nodes. RECOMMENDATION: Recommend patient return for second-look ultrasound to evaluate MR findings. Attempt to locate the 2 additional masses within the right breast at the 12 o'clock position and 4 o'clock position. Attempt to locate the 2 additional masses within the left breast at the 9 o'clock position and upper-outer left breast. Reassess the  right axilla for possible right axillary adenopathy. If any of these masses are not able to be assessed with ultrasound, MR biopsy of the  mass would be recommended. BI-RADS CATEGORY  4: Suspicious. Electronically Signed   By: Annia Belt M.D.   On: 10/25/2022 11:21  DG Chest Port 1 View  Result Date: 10/24/2022 CLINICAL DATA:  [id number] S/P PICC central line placement [id number] EXAM: PORTABLE CHEST 1 VIEW COMPARISON:  None Available. FINDINGS: The cardiomediastinal silhouette is normal in contour.LEFT chest port with tip terminating over the superior cavoatrial junction. No pleural effusion. No pneumothorax. No acute pleuroparenchymal abnormality. IMPRESSION: LEFT chest port with tip terminating over the superior cavoatrial junction. Electronically Signed   By: Meda Klinefelter M.D.   On: 10/24/2022 09:22   DG C-Arm 1-60 Min-No Report  Result Date: 10/24/2022 Fluoroscopy was utilized by the requesting physician.  No radiographic interpretation.   [place] RT BREAST BX W LOC DEV 1ST LESION IMG BX SPEC US GUIDE  Addendum Date: 10/18/2022   ADDENDUM REPORT: 10/18/2022 12:11 ADDENDUM: PATHOLOGY revealed: BREAST, RIGHT AT 9:30 O'CLOCK, 5 CM FROM THE NIPPLE; ULTRASOUND-GUIDED CORE NEEDLE BIOPSY: - INVASIVE MAMMARY CARCINOMA, NO SPECIAL TYPE. 5 mm in this sample. Grade 3. Ductal carcinoma in situ: Not identified. Lymphovascular invasion: Not identified. Comment: Immunohistochemical studies were performed. Tumor cells are positive for GATA3, and negative for neuroendocrine markers chromogranin and synaptophysin. These findings support the above diagnosis. Pathology results are CONCORDANT with imaging findings, per Dr. Annia Belt. Pathology results and recommendations were discussed with patient via telephone on 10/17/2022. Patient reported biopsy site doing well with no adverse symptoms, and only slight tenderness at the site. Post biopsy care instructions were reviewed, questions were answered and my direct phone number was  provided. Patient was instructed to call [facility] for any additional questions or concerns related to biopsy site. RECOMMENDATIONS: 1. Please schedule patient for additional RIGHT breast ULTRASOUND-GUIDED CORE NEEDLE BIOPSY for a probably benign oval hypoechoic mass right breast 9:30 o'clock 5 cm from the nipple measuring up to 0.9 cm. 2. Surgical and oncological consultation. Request for surgical and oncological consultation relayed to Irving Shows RN at [facility] by Randa Lynn RN on 10/17/2022. Pathology results reported by Randa Lynn RN on 10/17/2022. Electronically Signed   By: Annia Belt M.D.   On: 10/18/2022 12:11   Result Date: 10/18/2022 CLINICAL DATA:  Indeterminate palpable right breast mass. EXAM: ULTRASOUND GUIDED RIGHT BREAST CORE NEEDLE BIOPSY COMPARISON:  Previous exam(s). PROCEDURE: I met with the patient and we discussed the procedure of ultrasound-guided biopsy, including benefits and alternatives. We discussed the high likelihood of a successful procedure. We discussed the risks of the procedure, including infection, bleeding, tissue injury, clip migration, and inadequate sampling. Informed written consent was given. The usual time-out protocol was performed immediately prior to the procedure. Lesion quadrant: Upper outer quadrant Using sterile technique and 1% Lidocaine as local anesthetic, under direct ultrasound visualization, a 14 gauge spring-loaded device was used to perform biopsy of right breast mass 9:30 o'clock using a lateral approach. At the conclusion of the procedure coil shaped tissue marker clip was deployed into the biopsy cavity. Follow up 2 view mammogram was performed and dictated separately. IMPRESSION: Ultrasound guided biopsy of right breast mass 9:30 o'clock. No apparent complications. Electronically Signed: By: Annia Belt M.D. On: 10/14/2022 09:08   MM CLIP PLACEMENT RIGHT  Result Date: 10/14/2022 CLINICAL DATA:  Status post  ultrasound biopsy right breast mass EXAM: 3D DIAGNOSTIC RIGHT MAMMOGRAM POST ULTRASOUND BIOPSY COMPARISON:  Previous exam(s). FINDINGS: 3D Mammographic images were obtained following ultrasound guided biopsy of right breast mass 9:30 o'clock.  The biopsy marking clip is in expected position at the site of biopsy. IMPRESSION: Appropriate positioning of the coil shaped biopsy marking clip at the site of biopsy in the right breast mass 9:30 o'clock. Final Assessment: Post Procedure Mammograms for Marker Placement Electronically Signed   By: Annia Belt M.D.   On: 10/14/2022 09:02    Assessment and plan- Patient is a 32 y.o. 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  I will see her back in 2 weeks for cycle 2.  Will continue to monitor her mass clinically as well.  Plan to get interim ultrasound after 4 cycles of AC chemotherapy.  Discussed risks and benefits of chemotherapy including all but not limited to nausea, vomiting, low blood counts, risk of infections and hospitalizations.  Possible risk of infertility associated with chemotherapy.  Patient understands and agrees to proceed as planned.  She does not desire fertility conservation at this time.   Visit Diagnosis 1. Encounter for antineoplastic chemotherapy   2. Malignant neoplasm of upper-outer quadrant of right breast in female, estrogen receptor negative (HCC)      Dr. Owens Shark, MD, MPH [facility] [phone] 11/08/2022 1:40 PM

## 2022-11-08 NOTE — Patient Instructions (Signed)
[place] [facility]  Discharge Instructions: Thank you for choosing [facility] to provide your oncology and hematology care.  If you have a lab appointment with the Cancer Center, please go directly to the Cancer Center and check in at the registration area.  Wear comfortable clothing and clothing appropriate for easy access to any Portacath or PICC line.   We strive to give you quality time with your provider. You may need to reschedule your appointment if you arrive late (15 or more minutes).  Arriving late affects you and other patients whose appointments are after yours.  Also, if you miss three or more appointments without notifying the office, you may be dismissed from the clinic at the provider's discretion.      For prescription refill requests, have your pharmacy contact our office and allow 72 hours for refills to be completed.    Today you received the following chemotherapy and/or immunotherapy agents Doxorubicin and Cytoxan.      To help prevent nausea and vomiting after your treatment, we encourage you to take your nausea medication as directed.  BELOW ARE SYMPTOMS THAT SHOULD BE REPORTED IMMEDIATELY: *FEVER GREATER THAN 100.4 F (38 C) OR HIGHER *CHILLS OR SWEATING *NAUSEA AND VOMITING THAT IS NOT CONTROLLED WITH YOUR NAUSEA MEDICATION *UNUSUAL SHORTNESS OF BREATH *UNUSUAL BRUISING OR BLEEDING *URINARY PROBLEMS (pain or burning when urinating, or frequent urination) *BOWEL PROBLEMS (unusual diarrhea, constipation, pain near the anus) TENDERNESS IN MOUTH AND THROAT WITH OR WITHOUT PRESENCE OF ULCERS (sore throat, sores in mouth, or a toothache) UNUSUAL RASH, SWELLING OR PAIN  UNUSUAL VAGINAL DISCHARGE OR ITCHING   Items with * indicate a potential emergency and should be followed up as soon as possible or go to the Emergency Department if any problems should occur.  Please show the CHEMOTHERAPY ALERT CARD or IMMUNOTHERAPY ALERT CARD  at check-in to the Emergency Department and triage nurse.  Should you have questions after your visit or need to cancel or reschedule your appointment, please contact [place] [facility]  [phone] and follow the prompts.  Office hours are 8:00 a.m. to 4:30 p.m. Monday - Friday. Please note that voicemails left after 4:00 p.m. may not be returned until the following business day.  We are closed weekends and major holidays. You have access to a nurse at all times for urgent questions. Please call the main number to the clinic [phone] and follow the prompts.  For any non-urgent questions, you may also contact your provider using MyChart. We now offer e-Visits for anyone 26 and older to request care online for non-urgent symptoms. For details visit [email or web address].   Also download the MyChart app! Go to the app store, search "MyChart", open the app, select [place], and log in with your MyChart username and password.

## 2022-11-10 ENCOUNTER — Encounter: Payer: Self-pay | Admitting: Oncology

## 2022-11-10 NOTE — Progress Notes (Signed)
Hematology/Oncology Consult note [facility]  [phone] [phone]  Patient Care Team: Glori Luis, MD as PCP - General (Family Medicine) Hulen Luster, RN as Oncology Nurse Navigator   Name of the patient: Eileen Hart  [id number]  March 10, 1991   Date of visit: 11/10/22  Diagnosis- clinical prognostic stage Ib invasive mammary carcinoma of the right breast cT1 cN0 M0 triple negative    Chief complaint/ Reason for visit-pathology results and further management  Heme/Onc history: Patient is a 32 year old female who underwent a bilateral diagnostic mammogram on 10/07/2022 after she felt a palpable area of concern in her right breast. Mammogram showed a 1.6 x 1 x 1.5 cm solid mass in the right breast 9:30 position 5 cm from the nipple. There was also an adjacent 6 x 9 x 2 mm mass in the right breast. No right axillary adenopathy. The dominant mass was biopsied and was consistent with invasive mammary carcinoma grade 3 ER and HER2 negative.    Bilateral breast MRI showed additional areas of concern in the right breast.  2 additional enhancing masses 1 in the 12 o'clock position and 1 in the 4 o'clock position.  2 adjacent prominent right axillary lymph nodes.  2 intermittent masses in the left breast 9 o'clock position and upper outer left breast.  This was followed by a dedicated ultrasound.  She had 3 breast biopsies in the right side and 1 left breast biopsy and all of them were negative for malignancy.  On the ultrasound her right axillary lymph nodes appeared normal and therefore biopsy was not recommended for the same.    Interval history-no acute issues since last visit  ECOG PS- 0 Pain scale- 0   Review of systems- Review of Systems  Constitutional:  Negative for chills, fever, malaise/fatigue and weight loss.  HENT:  Negative for congestion, ear discharge and nosebleeds.   Eyes:  Negative for blurred vision.  Respiratory:   Negative for cough, hemoptysis, sputum production, shortness of breath and wheezing.   Cardiovascular:  Negative for chest pain, palpitations, orthopnea and claudication.  Gastrointestinal:  Negative for abdominal pain, blood in stool, constipation, diarrhea, heartburn, melena, nausea and vomiting.  Genitourinary:  Negative for dysuria, flank pain, frequency, hematuria and urgency.  Musculoskeletal:  Negative for back pain, joint pain and myalgias.  Skin:  Negative for rash.  Neurological:  Negative for dizziness, tingling, focal weakness, seizures, weakness and headaches.  Endo/Heme/Allergies:  Does not bruise/bleed easily.  Psychiatric/Behavioral:  Negative for depression and suicidal ideas. The patient does not have insomnia.       No Known Allergies   Past Medical History:  Diagnosis Date   Chlamydia 01/25/2010   Depression with anxiety    Gestational hypertension    Immunization, viral disease    gardasil series completed   Tobacco abuse 05/02/2016     Past Surgical History:  Procedure Laterality Date   AUGMENTATION MAMMAPLASTY Bilateral    saline implants 2015   BREAST BIOPSY Right 10/14/2022   US biopsy/ coil clip/ path pending   BREAST BIOPSY Right 10/14/2022   [place] RT BREAST BX W LOC DEV 1ST LESION IMG BX SPEC US GUIDE 10/14/2022 [facility]   BREAST BIOPSY Left 10/29/2022   [place] Core Bx Ribbon clip- path pending   BREAST BIOPSY Right 10/29/2022   [place] Core 1130 Venus Clip- path pending   BREAST BIOPSY Right 10/29/2022   [place] Core Bx Retroareolar heart clip-path pending   BREAST BIOPSY  Right 10/29/2022   [place] Core Bx Ribbon Clip path pending   BREAST BIOPSY Right 10/29/2022   [place] RT BREAST BX W LOC DEV EA ADD LESION IMG BX SPEC US GUIDE 10/29/2022 [facility]   BREAST BIOPSY Right 10/29/2022   [place] RT BREAST BX W LOC DEV 1ST LESION IMG BX SPEC US GUIDE 10/29/2022 [facility]   BREAST BIOPSY Right 10/29/2022   [place] RT BREAST BX W LOC DEV EA ADD LESION IMG BX SPEC US GUIDE  10/29/2022 [facility]   BREAST BIOPSY Left 10/29/2022   [place] LT BREAST BX W LOC DEV 1ST LESION IMG BX SPEC US GUIDE 10/29/2022 [facility]   BREAST ENHANCEMENT SURGERY  2015   CESAREAN SECTION  2014   PORTACATH PLACEMENT N/A 10/24/2022   Procedure: INSERTION PORT-A-CATH;  Surgeon: Leafy Ro, MD;  Location: [facility];  Service: General;  Laterality: N/A;   TONSILLECTOMY      Social History   Socioeconomic History   Marital status: Single    Spouse name: Not on file   Number of children: 1   Years of education: 15   Highest education level: Not on file  Occupational History   Occupation: Groomer    Comment: [employer]  Tobacco Use   Smoking status: Former    Packs/day: .5    Types: Cigarettes    Quit date: 07/25/2018    Years since quitting: 4.2   Smokeless tobacco: Never   Tobacco comments:    quit 07/2018  Vaping Use   Vaping Use: Never used  Substance and Sexual Activity   Alcohol use: No    Alcohol/week: 0.0 standard drinks of alcohol   Drug use: No   Sexual activity: Yes    Birth control/protection: Pill  Other Topics Concern   Not on file  Social History Narrative   Not on file   Social Determinants of Health   Financial Resource Strain: Not on file  Food Insecurity: No Food Insecurity (10/22/2022)   Hunger Vital Sign    Worried About Running Out of Food in the Last Year: Never true    Ran Out of Food in the Last Year: Never true  Transportation Needs: No Transportation Needs (10/22/2022)   PRAPARE - Administrator, Civil Service (Medical): No    Lack of Transportation (Non-Medical): No  Physical Activity: Not on file  Stress: Not on file  Social Connections: Not on file  Intimate Partner Violence: Not At Risk (10/22/2022)   Humiliation, Afraid, Rape, and Kick questionnaire    Fear of Current or Ex-Partner: No    Emotionally Abused: No    Physically Abused: No    Sexually Abused: No    Family History  Problem Relation Age of  Onset   Breast cancer Other 35       mat 2nd cousin   Diabetes Neg Hx    Heart disease Neg Hx    Hypertension Neg Hx    Ovarian cancer Neg Hx    Colon cancer Neg Hx      Current Outpatient Medications:    escitalopram (LEXAPRO) 10 MG tablet, TAKE 1 TABLET BY MOUTH EVERY DAY, Disp: 90 tablet, Rfl: 2   HYDROcodone-acetaminophen (NORCO/VICODIN) 5-325 MG tablet, Take 1-2 tablets by mouth every 6 (six) hours as needed for moderate pain., Disp: 15 tablet, Rfl: 0   norgestimate-ethinyl estradiol (ORTHO-CYCLEN) 0.25-35 MG-MCG tablet, Take 1 tablet by mouth daily., Disp: 84 tablet, Rfl: 3   dexamethasone (DECADRON) 4 MG tablet, Take 2  tablets (8 mg total) by mouth daily for 3 days. Start the day after doxorubicin/cyclophosphamide chemotherapy. Take with food., Disp: 30 tablet, Rfl: 1   lidocaine-prilocaine (EMLA) cream, Apply to affected area once, Disp: 30 g, Rfl: 3   ondansetron (ZOFRAN) 8 MG tablet, Take 1 tab (8 mg) by mouth every 8 hrs as needed for nausea/vomiting. Start third day after doxorubicin/cyclophosphamide chemotherapy., Disp: 30 tablet, Rfl: 1   prochlorperazine (COMPAZINE) 10 MG tablet, Take 1 tablet (10 mg total) by mouth every 6 (six) hours as needed for nausea or vomiting., Disp: 30 tablet, Rfl: 1  Physical exam:  Vitals:   11/01/22 0953  BP: 119/80  Pulse: 88  Resp: 16  Temp: 98.9 F (37.2 C)  TempSrc: Tympanic  SpO2: 100%  Weight: 121 lb 8 oz (55.1 kg)   Physical Exam Cardiovascular:     Rate and Rhythm: Normal rate and regular rhythm.     Heart sounds: Normal heart sounds.  Pulmonary:     Effort: Pulmonary effort is normal.     Breath sounds: Normal breath sounds.  Skin:    General: Skin is warm and dry.  Neurological:     Mental Status: She is alert and oriented to person, place, and time.         Latest Ref Rng & Units 11/08/2022   10:46 AM  CMP  Glucose 70 - 99 mg/dL 119   BUN 6 - 20 mg/dL 8   Creatinine 1.47 - 8.29 mg/dL 5.62   Sodium 130 - 865  mmol/L 137   Potassium 3.5 - 5.1 mmol/L 3.5   Chloride 98 - 111 mmol/L 106   CO2 22 - 32 mmol/L 21   Calcium 8.9 - 10.3 mg/dL 8.9   Total Protein 6.5 - 8.1 g/dL 7.1   Total Bilirubin 0.3 - 1.2 mg/dL 0.7   Alkaline Phos 38 - 126 U/L 48   AST 15 - 41 U/L 17   ALT 0 - 44 U/L 8       Latest Ref Rng & Units 11/08/2022   10:46 AM  CBC  WBC 4.0 - 10.5 K/uL 7.5   Hemoglobin 12.0 - 15.0 g/dL 78.4   Hematocrit 69.6 - 46.0 % 39.0   Platelets 150 - 400 K/uL 261       NM PET Image Initial (PI) Skull Base To Thigh  Result Date: 11/06/2022 CLINICAL DATA:  Initial treatment strategy for newly diagnosed right breast cancer. EXAM: NUCLEAR MEDICINE PET SKULL BASE TO THIGH TECHNIQUE: 6.73 mCi F-18 FDG was injected intravenously. Full-ring PET imaging was performed from the skull base to thigh after the radiotracer. CT data was obtained and used for attenuation correction and anatomic localization. Fasting blood glucose: 82 mg/dl COMPARISON:  No prior relevant cross-sectional imaging. Recent right breast imaging is correlated. FINDINGS: Mediastinal blood pool activity: SUV max 1.6 NECK: No hypermetabolic cervical lymph nodes are identified.Fairly symmetric activity within the lymphoid tissue of Waldeyer's ring is within physiologic limits.No suspicious activity identified within the pharyngeal mucosal space. Incidental CT findings: none CHEST: There are no hypermetabolic mediastinal, hilar, axillary or internal mammary lymph nodes. There is focal hypermetabolic activity laterally in the right breast adjacent to a biopsy clip, demonstrating an SUV max of 4.3. No other hypermetabolic activity is seen within the breasts or chest wall. No hypermetabolic pulmonary activity or suspicious nodularity. Incidental CT findings: Left IJ Port-A-Cath extends to the superior cavoatrial junction. Small amount of residual thymic tissue in the anterior mediastinum. There are bilateral  subpectoral breast implants. ABDOMEN/PELVIS:  There is no hypermetabolic activity within the liver, adrenal glands, spleen or pancreas. There is no hypermetabolic nodal activity in the abdomen or pelvis. There is tubular activity in the pelvis bilaterally. On the right, this is posteriorly located and may relate to the right ureter or adnexa. On the left, this is anteriorly located and appears adnexal within SUV max of 16.3. Incidental CT findings: Possible punctate renal calculi bilaterally. No hydronephrosis, ascites or peritoneal nodularity. SKELETON: There is no hypermetabolic activity to suggest osseous metastatic disease. Incidental CT findings: none IMPRESSION: 1. Focal hypermetabolic activity laterally in the right breast consistent with known breast cancer. 2. No evidence of metastatic disease. 3. Indeterminate tubular activity in the pelvis bilaterally, likely physiologic. Consider further evaluation with pelvic ultrasound, as clinically warranted. 4. Possible punctate renal calculi bilaterally. Electronically Signed   By: Carey Bullocks M.D.   On: 11/06/2022 16:39   [place] RT BREAST BX W LOC DEV 1ST LESION IMG BX SPEC US GUIDE  Addendum Date: 11/04/2022   ADDENDUM REPORT: 11/04/2022 12:43 ADDENDUM: PATHOLOGY revealed: BREAST, RIGHT 9:30 5 CMFN; ULTRASOUND-GUIDED BIOPSY: BENIGN BREAST TISSUE WITH FIBROUS STROMA, SCLEROSING ADENOSIS, AND FOCAL STROMAL HEMORRHAGE. NEGATIVE FOR ATYPIA AND MALIGNANCY. Pathology results are CONCORDANT with imaging findings, per Dr. Harmon Pier. PATHOLOGY revealed: BREAST, RIGHT 11:30 5 CM FN; ULTRASOUND-GUIDED BIOPSY: FIBROADENOMA. BACKGROUND BENIGN BREAST TISSUE WITH FOCAL LACTATIONAL CHANGE. NEGATIVE FOR ATYPIA AND MALIGNANCY. Pathology results are CONCORDANT with imaging findings, per Dr. Harmon Pier. PATHOLOGY revealed: BREAST, RIGHT RETROAREOLAR 3:00; ULTRASOUND-GUIDED BIOPSY: BENIGN BREAST TISSUE WITH FIBROUS STROMA, SCLEROSING ADENOSIS, AND FOCAL STROMAL HEMORRHAGE. NEGATIVE FOR ATYPIA AND MALIGNANCY. Pathology  results are CONCORDANT with imaging findings, per Dr. Harmon Pier. PATHOLOGY revealed: BREAST, LEFT 9:00 4 CM FN; ULTRASOUND-GUIDED BIOPSY: FIBROADENOMA. NEGATIVE FOR ATYPIA AND MALIGNANCY. Pathology results are CONCORDANT with imaging findings, per Dr. Harmon Pier. Pathology results and recommendations below were discussed with patient by telephone on 10/30/22. Patient reported biopsy site within normal limits with slight tenderness at the site. Post biopsy care instructions were reviewed, questions were answered and my direct phone number was provided to patient. Patient was instructed to call [facility] if any concerns or questions arise related to the biopsy. RECOMMENDATION: Patient instructed to continue monthly self breast examinations and to return to screenings at the recommendation of her oncologist. Pathology results reported by Hezzie Bump, RN 10/30/2022. Electronically Signed   By: Harmon Pier M.D.   On: 11/04/2022 12:43   Result Date: 11/04/2022 CLINICAL DATA:  32 year old female with newly diagnosed RIGHT breast cancer. Patient presents for tissue sampling of 3 RIGHT breast masses and 1 LEFT breast mass (RIGHT breast mass identified on 10/07/2022 ultrasound -9:30 position mass, as well as 2 additional RIGHT breast masses and 1 LEFT breast mass identified on 2nd look ultrasound from MR findings. EXAM: ULTRASOUND GUIDED RIGHT BREAST CORE NEEDLE BIOPSY X 3 ULTRASOUND GUIDED LEFT BREAST CORE NEEDLE BIOPSY COMPARISON:  Previous exam(s). PROCEDURE: I met with the patient and we discussed the procedure of ultrasound-guided biopsy, including benefits and alternatives. We discussed the high likelihood of a successful procedure. We discussed the risks of the procedure, including moderate chance of implant damage, infection, bleeding, tissue injury, clip migration, and inadequate sampling. Informed written consent was given. The usual time-out protocol was performed immediately prior to the  procedure. ULTRASOUND GUIDED RIGHT BREAST CORE NEEDLE BIOPSY #1 (0.9 cm mass at 9:30 position-RIBBON clip): Lesion quadrant: UPPER-OUTER RIGHT breast Using sterile technique and 1% Lidocaine with  and without epinephrine as local anesthetic, under direct ultrasound visualization, a 14 gauge spring-loaded device was used to perform biopsy of the 0.9 cm mass at the 9:30 position of the RIGHT breast using a LATERAL approach. At the conclusion of the procedure a RIBBON shaped tissue marker clip was deployed into the biopsy cavity. Follow up 2 view mammogram was performed and dictated separately. ULTRASOUND GUIDED RIGHT BREAST CORE NEEDLE BIOPSY #2 (1.2 cm mass at the 11:30 position-VENUS clip): Lesion quadrant: UPPER-OUTER RIGHT breast Using sterile technique and 1% Lidocaine with and without epinephrine as local anesthetic, under direct ultrasound visualization, a 14 gauge spring-loaded device was used to perform biopsy of the 1.2 cm mass at the 11:30 position of the RIGHT breast using a LATERAL approach. At the conclusion of the procedure a VENUS shaped tissue marker clip was deployed into the biopsy cavity. Follow up 2 view mammogram was performed and dictated separately. ULTRASOUND GUIDED RIGHT BREAST CORE NEEDLE BIOPSY #3 (0.7 cm 3 o'clock position RETROAREOLAR mass-HEART clip): Using sterile technique and 1% Lidocaine with and without epinephrine as local anesthetic, under direct ultrasound visualization, a 14 gauge spring-loaded device was used to perform biopsy of the 0.7 cm mass at the 3 o'clock position of the RETROAREOLAR RIGHT breast using a SUPERIOR approach. At the conclusion of the procedure a HEART shaped tissue marker clip was deployed into the biopsy cavity. Follow up 2 view mammogram was performed and dictated separately. ULTRASOUND GUIDED LEFT BREAST CORE NEEDLE BIOPSY (0.9 cm 9 o'clock position mass-RIBBON clip): Using sterile technique and 1% Lidocaine with and without epinephrine as local  anesthetic, under direct ultrasound visualization, a 14 gauge spring-loaded device was used to perform biopsy of the 0.9 cm mass at the 9 o'clock position of the LEFT breast using a MEDIAL approach. At the conclusion of the procedure RIBBON shaped tissue marker clip was deployed into the biopsy cavity. Follow up 2 view mammogram was performed and dictated separately. IMPRESSION: Ultrasound guided biopsy of 0.9 cm RIGHT breast mass at the 9:30 position (RIBBON clip). Ultrasound-guided biopsy of 1.2 cm RIGHT breast mass at the 11:30 position (VENUS clip). Ultrasound-guided biopsy 0.7 cm INNER RETROAREOLAR RIGHT breast mass (HEART clip). Ultrasound guided biopsy of 0.9 cm INNER LEFT breast mass (RIBBON clip). No apparent complications. Electronically Signed: By: Harmon Pier M.D. On: 10/29/2022 13:29   [place] LT BREAST BX W LOC DEV 1ST LESION IMG BX SPEC US GUIDE  Addendum Date: 11/04/2022   ADDENDUM REPORT: 11/04/2022 12:43 ADDENDUM: PATHOLOGY revealed: BREAST, RIGHT 9:30 5 CMFN; ULTRASOUND-GUIDED BIOPSY: BENIGN BREAST TISSUE WITH FIBROUS STROMA, SCLEROSING ADENOSIS, AND FOCAL STROMAL HEMORRHAGE. NEGATIVE FOR ATYPIA AND MALIGNANCY. Pathology results are CONCORDANT with imaging findings, per Dr. Harmon Pier. PATHOLOGY revealed: BREAST, RIGHT 11:30 5 CM FN; ULTRASOUND-GUIDED BIOPSY: FIBROADENOMA. BACKGROUND BENIGN BREAST TISSUE WITH FOCAL LACTATIONAL CHANGE. NEGATIVE FOR ATYPIA AND MALIGNANCY. Pathology results are CONCORDANT with imaging findings, per Dr. Harmon Pier. PATHOLOGY revealed: BREAST, RIGHT RETROAREOLAR 3:00; ULTRASOUND-GUIDED BIOPSY: BENIGN BREAST TISSUE WITH FIBROUS STROMA, SCLEROSING ADENOSIS, AND FOCAL STROMAL HEMORRHAGE. NEGATIVE FOR ATYPIA AND MALIGNANCY. Pathology results are CONCORDANT with imaging findings, per Dr. Harmon Pier. PATHOLOGY revealed: BREAST, LEFT 9:00 4 CM FN; ULTRASOUND-GUIDED BIOPSY: FIBROADENOMA. NEGATIVE FOR ATYPIA AND MALIGNANCY. Pathology results are CONCORDANT with imaging findings,  per Dr. Harmon Pier. Pathology results and recommendations below were discussed with patient by telephone on 10/30/22. Patient reported biopsy site within normal limits with slight tenderness at the site. Post biopsy care instructions were reviewed, questions were answered and  my direct phone number was provided to patient. Patient was instructed to call [facility] if any concerns or questions arise related to the biopsy. RECOMMENDATION: Patient instructed to continue monthly self breast examinations and to return to screenings at the recommendation of her oncologist. Pathology results reported by Hezzie Bump, RN 10/30/2022. Electronically Signed   By: Harmon Pier M.D.   On: 11/04/2022 12:43   Result Date: 11/04/2022 CLINICAL DATA:  32 year old female with newly diagnosed RIGHT breast cancer. Patient presents for tissue sampling of 3 RIGHT breast masses and 1 LEFT breast mass (RIGHT breast mass identified on 10/07/2022 ultrasound -9:30 position mass, as well as 2 additional RIGHT breast masses and 1 LEFT breast mass identified on 2nd look ultrasound from MR findings. EXAM: ULTRASOUND GUIDED RIGHT BREAST CORE NEEDLE BIOPSY X 3 ULTRASOUND GUIDED LEFT BREAST CORE NEEDLE BIOPSY COMPARISON:  Previous exam(s). PROCEDURE: I met with the patient and we discussed the procedure of ultrasound-guided biopsy, including benefits and alternatives. We discussed the high likelihood of a successful procedure. We discussed the risks of the procedure, including moderate chance of implant damage, infection, bleeding, tissue injury, clip migration, and inadequate sampling. Informed written consent was given. The usual time-out protocol was performed immediately prior to the procedure. ULTRASOUND GUIDED RIGHT BREAST CORE NEEDLE BIOPSY #1 (0.9 cm mass at 9:30 position-RIBBON clip): Lesion quadrant: UPPER-OUTER RIGHT breast Using sterile technique and 1% Lidocaine with and without epinephrine as local anesthetic, under  direct ultrasound visualization, a 14 gauge spring-loaded device was used to perform biopsy of the 0.9 cm mass at the 9:30 position of the RIGHT breast using a LATERAL approach. At the conclusion of the procedure a RIBBON shaped tissue marker clip was deployed into the biopsy cavity. Follow up 2 view mammogram was performed and dictated separately. ULTRASOUND GUIDED RIGHT BREAST CORE NEEDLE BIOPSY #2 (1.2 cm mass at the 11:30 position-VENUS clip): Lesion quadrant: UPPER-OUTER RIGHT breast Using sterile technique and 1% Lidocaine with and without epinephrine as local anesthetic, under direct ultrasound visualization, a 14 gauge spring-loaded device was used to perform biopsy of the 1.2 cm mass at the 11:30 position of the RIGHT breast using a LATERAL approach. At the conclusion of the procedure a VENUS shaped tissue marker clip was deployed into the biopsy cavity. Follow up 2 view mammogram was performed and dictated separately. ULTRASOUND GUIDED RIGHT BREAST CORE NEEDLE BIOPSY #3 (0.7 cm 3 o'clock position RETROAREOLAR mass-HEART clip): Using sterile technique and 1% Lidocaine with and without epinephrine as local anesthetic, under direct ultrasound visualization, a 14 gauge spring-loaded device was used to perform biopsy of the 0.7 cm mass at the 3 o'clock position of the RETROAREOLAR RIGHT breast using a SUPERIOR approach. At the conclusion of the procedure a HEART shaped tissue marker clip was deployed into the biopsy cavity. Follow up 2 view mammogram was performed and dictated separately. ULTRASOUND GUIDED LEFT BREAST CORE NEEDLE BIOPSY (0.9 cm 9 o'clock position mass-RIBBON clip): Using sterile technique and 1% Lidocaine with and without epinephrine as local anesthetic, under direct ultrasound visualization, a 14 gauge spring-loaded device was used to perform biopsy of the 0.9 cm mass at the 9 o'clock position of the LEFT breast using a MEDIAL approach. At the conclusion of the procedure RIBBON shaped tissue  marker clip was deployed into the biopsy cavity. Follow up 2 view mammogram was performed and dictated separately. IMPRESSION: Ultrasound guided biopsy of 0.9 cm RIGHT breast mass at the 9:30 position (RIBBON clip). Ultrasound-guided biopsy of 1.2 cm  RIGHT breast mass at the 11:30 position (VENUS clip). Ultrasound-guided biopsy 0.7 cm INNER RETROAREOLAR RIGHT breast mass (HEART clip). Ultrasound guided biopsy of 0.9 cm INNER LEFT breast mass (RIBBON clip). No apparent complications. Electronically Signed: By: Harmon Pier M.D. On: 10/29/2022 13:29   [place] RT BREAST BX W LOC DEV EA ADD LESION IMG BX SPEC US GUIDE  Addendum Date: 11/04/2022   ADDENDUM REPORT: 11/04/2022 12:43 ADDENDUM: PATHOLOGY revealed: BREAST, RIGHT 9:30 5 CMFN; ULTRASOUND-GUIDED BIOPSY: BENIGN BREAST TISSUE WITH FIBROUS STROMA, SCLEROSING ADENOSIS, AND FOCAL STROMAL HEMORRHAGE. NEGATIVE FOR ATYPIA AND MALIGNANCY. Pathology results are CONCORDANT with imaging findings, per Dr. Harmon Pier. PATHOLOGY revealed: BREAST, RIGHT 11:30 5 CM FN; ULTRASOUND-GUIDED BIOPSY: FIBROADENOMA. BACKGROUND BENIGN BREAST TISSUE WITH FOCAL LACTATIONAL CHANGE. NEGATIVE FOR ATYPIA AND MALIGNANCY. Pathology results are CONCORDANT with imaging findings, per Dr. Harmon Pier. PATHOLOGY revealed: BREAST, RIGHT RETROAREOLAR 3:00; ULTRASOUND-GUIDED BIOPSY: BENIGN BREAST TISSUE WITH FIBROUS STROMA, SCLEROSING ADENOSIS, AND FOCAL STROMAL HEMORRHAGE. NEGATIVE FOR ATYPIA AND MALIGNANCY. Pathology results are CONCORDANT with imaging findings, per Dr. Harmon Pier. PATHOLOGY revealed: BREAST, LEFT 9:00 4 CM FN; ULTRASOUND-GUIDED BIOPSY: FIBROADENOMA. NEGATIVE FOR ATYPIA AND MALIGNANCY. Pathology results are CONCORDANT with imaging findings, per Dr. Harmon Pier. Pathology results and recommendations below were discussed with patient by telephone on 10/30/22. Patient reported biopsy site within normal limits with slight tenderness at the site. Post biopsy care instructions were reviewed,  questions were answered and my direct phone number was provided to patient. Patient was instructed to call [facility] if any concerns or questions arise related to the biopsy. RECOMMENDATION: Patient instructed to continue monthly self breast examinations and to return to screenings at the recommendation of her oncologist. Pathology results reported by Hezzie Bump, RN 10/30/2022. Electronically Signed   By: Harmon Pier M.D.   On: 11/04/2022 12:43   Result Date: 11/04/2022 CLINICAL DATA:  33 year old female with newly diagnosed RIGHT breast cancer. Patient presents for tissue sampling of 3 RIGHT breast masses and 1 LEFT breast mass (RIGHT breast mass identified on 10/07/2022 ultrasound -9:30 position mass, as well as 2 additional RIGHT breast masses and 1 LEFT breast mass identified on 2nd look ultrasound from MR findings. EXAM: ULTRASOUND GUIDED RIGHT BREAST CORE NEEDLE BIOPSY X 3 ULTRASOUND GUIDED LEFT BREAST CORE NEEDLE BIOPSY COMPARISON:  Previous exam(s). PROCEDURE: I met with the patient and we discussed the procedure of ultrasound-guided biopsy, including benefits and alternatives. We discussed the high likelihood of a successful procedure. We discussed the risks of the procedure, including moderate chance of implant damage, infection, bleeding, tissue injury, clip migration, and inadequate sampling. Informed written consent was given. The usual time-out protocol was performed immediately prior to the procedure. ULTRASOUND GUIDED RIGHT BREAST CORE NEEDLE BIOPSY #1 (0.9 cm mass at 9:30 position-RIBBON clip): Lesion quadrant: UPPER-OUTER RIGHT breast Using sterile technique and 1% Lidocaine with and without epinephrine as local anesthetic, under direct ultrasound visualization, a 14 gauge spring-loaded device was used to perform biopsy of the 0.9 cm mass at the 9:30 position of the RIGHT breast using a LATERAL approach. At the conclusion of the procedure a RIBBON shaped tissue marker clip was  deployed into the biopsy cavity. Follow up 2 view mammogram was performed and dictated separately. ULTRASOUND GUIDED RIGHT BREAST CORE NEEDLE BIOPSY #2 (1.2 cm mass at the 11:30 position-VENUS clip): Lesion quadrant: UPPER-OUTER RIGHT breast Using sterile technique and 1% Lidocaine with and without epinephrine as local anesthetic, under direct ultrasound visualization, a 14 gauge spring-loaded device was used to  perform biopsy of the 1.2 cm mass at the 11:30 position of the RIGHT breast using a LATERAL approach. At the conclusion of the procedure a VENUS shaped tissue marker clip was deployed into the biopsy cavity. Follow up 2 view mammogram was performed and dictated separately. ULTRASOUND GUIDED RIGHT BREAST CORE NEEDLE BIOPSY #3 (0.7 cm 3 o'clock position RETROAREOLAR mass-HEART clip): Using sterile technique and 1% Lidocaine with and without epinephrine as local anesthetic, under direct ultrasound visualization, a 14 gauge spring-loaded device was used to perform biopsy of the 0.7 cm mass at the 3 o'clock position of the RETROAREOLAR RIGHT breast using a SUPERIOR approach. At the conclusion of the procedure a HEART shaped tissue marker clip was deployed into the biopsy cavity. Follow up 2 view mammogram was performed and dictated separately. ULTRASOUND GUIDED LEFT BREAST CORE NEEDLE BIOPSY (0.9 cm 9 o'clock position mass-RIBBON clip): Using sterile technique and 1% Lidocaine with and without epinephrine as local anesthetic, under direct ultrasound visualization, a 14 gauge spring-loaded device was used to perform biopsy of the 0.9 cm mass at the 9 o'clock position of the LEFT breast using a MEDIAL approach. At the conclusion of the procedure RIBBON shaped tissue marker clip was deployed into the biopsy cavity. Follow up 2 view mammogram was performed and dictated separately. IMPRESSION: Ultrasound guided biopsy of 0.9 cm RIGHT breast mass at the 9:30 position (RIBBON clip). Ultrasound-guided biopsy of 1.2 cm  RIGHT breast mass at the 11:30 position (VENUS clip). Ultrasound-guided biopsy 0.7 cm INNER RETROAREOLAR RIGHT breast mass (HEART clip). Ultrasound guided biopsy of 0.9 cm INNER LEFT breast mass (RIBBON clip). No apparent complications. Electronically Signed: By: Harmon Pier M.D. On: 10/29/2022 13:29   [place] RT BREAST BX W LOC DEV EA ADD LESION IMG BX SPEC US GUIDE  Addendum Date: 11/04/2022   ADDENDUM REPORT: 11/04/2022 12:43 ADDENDUM: PATHOLOGY revealed: BREAST, RIGHT 9:30 5 CMFN; ULTRASOUND-GUIDED BIOPSY: BENIGN BREAST TISSUE WITH FIBROUS STROMA, SCLEROSING ADENOSIS, AND FOCAL STROMAL HEMORRHAGE. NEGATIVE FOR ATYPIA AND MALIGNANCY. Pathology results are CONCORDANT with imaging findings, per Dr. Harmon Pier. PATHOLOGY revealed: BREAST, RIGHT 11:30 5 CM FN; ULTRASOUND-GUIDED BIOPSY: FIBROADENOMA. BACKGROUND BENIGN BREAST TISSUE WITH FOCAL LACTATIONAL CHANGE. NEGATIVE FOR ATYPIA AND MALIGNANCY. Pathology results are CONCORDANT with imaging findings, per Dr. Harmon Pier. PATHOLOGY revealed: BREAST, RIGHT RETROAREOLAR 3:00; ULTRASOUND-GUIDED BIOPSY: BENIGN BREAST TISSUE WITH FIBROUS STROMA, SCLEROSING ADENOSIS, AND FOCAL STROMAL HEMORRHAGE. NEGATIVE FOR ATYPIA AND MALIGNANCY. Pathology results are CONCORDANT with imaging findings, per Dr. Harmon Pier. PATHOLOGY revealed: BREAST, LEFT 9:00 4 CM FN; ULTRASOUND-GUIDED BIOPSY: FIBROADENOMA. NEGATIVE FOR ATYPIA AND MALIGNANCY. Pathology results are CONCORDANT with imaging findings, per Dr. Harmon Pier. Pathology results and recommendations below were discussed with patient by telephone on 10/30/22. Patient reported biopsy site within normal limits with slight tenderness at the site. Post biopsy care instructions were reviewed, questions were answered and my direct phone number was provided to patient. Patient was instructed to call [facility] if any concerns or questions arise related to the biopsy. RECOMMENDATION: Patient instructed to continue monthly self  breast examinations and to return to screenings at the recommendation of her oncologist. Pathology results reported by Hezzie Bump, RN 10/30/2022. Electronically Signed   By: Harmon Pier M.D.   On: 11/04/2022 12:43   Result Date: 11/04/2022 CLINICAL DATA:  32 year old female with newly diagnosed RIGHT breast cancer. Patient presents for tissue sampling of 3 RIGHT breast masses and 1 LEFT breast mass (RIGHT breast mass identified on 10/07/2022 ultrasound -9:30 position mass, as  well as 2 additional RIGHT breast masses and 1 LEFT breast mass identified on 2nd look ultrasound from MR findings. EXAM: ULTRASOUND GUIDED RIGHT BREAST CORE NEEDLE BIOPSY X 3 ULTRASOUND GUIDED LEFT BREAST CORE NEEDLE BIOPSY COMPARISON:  Previous exam(s). PROCEDURE: I met with the patient and we discussed the procedure of ultrasound-guided biopsy, including benefits and alternatives. We discussed the high likelihood of a successful procedure. We discussed the risks of the procedure, including moderate chance of implant damage, infection, bleeding, tissue injury, clip migration, and inadequate sampling. Informed written consent was given. The usual time-out protocol was performed immediately prior to the procedure. ULTRASOUND GUIDED RIGHT BREAST CORE NEEDLE BIOPSY #1 (0.9 cm mass at 9:30 position-RIBBON clip): Lesion quadrant: UPPER-OUTER RIGHT breast Using sterile technique and 1% Lidocaine with and without epinephrine as local anesthetic, under direct ultrasound visualization, a 14 gauge spring-loaded device was used to perform biopsy of the 0.9 cm mass at the 9:30 position of the RIGHT breast using a LATERAL approach. At the conclusion of the procedure a RIBBON shaped tissue marker clip was deployed into the biopsy cavity. Follow up 2 view mammogram was performed and dictated separately. ULTRASOUND GUIDED RIGHT BREAST CORE NEEDLE BIOPSY #2 (1.2 cm mass at the 11:30 position-VENUS clip): Lesion quadrant: UPPER-OUTER RIGHT breast Using  sterile technique and 1% Lidocaine with and without epinephrine as local anesthetic, under direct ultrasound visualization, a 14 gauge spring-loaded device was used to perform biopsy of the 1.2 cm mass at the 11:30 position of the RIGHT breast using a LATERAL approach. At the conclusion of the procedure a VENUS shaped tissue marker clip was deployed into the biopsy cavity. Follow up 2 view mammogram was performed and dictated separately. ULTRASOUND GUIDED RIGHT BREAST CORE NEEDLE BIOPSY #3 (0.7 cm 3 o'clock position RETROAREOLAR mass-HEART clip): Using sterile technique and 1% Lidocaine with and without epinephrine as local anesthetic, under direct ultrasound visualization, a 14 gauge spring-loaded device was used to perform biopsy of the 0.7 cm mass at the 3 o'clock position of the RETROAREOLAR RIGHT breast using a SUPERIOR approach. At the conclusion of the procedure a HEART shaped tissue marker clip was deployed into the biopsy cavity. Follow up 2 view mammogram was performed and dictated separately. ULTRASOUND GUIDED LEFT BREAST CORE NEEDLE BIOPSY (0.9 cm 9 o'clock position mass-RIBBON clip): Using sterile technique and 1% Lidocaine with and without epinephrine as local anesthetic, under direct ultrasound visualization, a 14 gauge spring-loaded device was used to perform biopsy of the 0.9 cm mass at the 9 o'clock position of the LEFT breast using a MEDIAL approach. At the conclusion of the procedure RIBBON shaped tissue marker clip was deployed into the biopsy cavity. Follow up 2 view mammogram was performed and dictated separately. IMPRESSION: Ultrasound guided biopsy of 0.9 cm RIGHT breast mass at the 9:30 position (RIBBON clip). Ultrasound-guided biopsy of 1.2 cm RIGHT breast mass at the 11:30 position (VENUS clip). Ultrasound-guided biopsy 0.7 cm INNER RETROAREOLAR RIGHT breast mass (HEART clip). Ultrasound guided biopsy of 0.9 cm INNER LEFT breast mass (RIBBON clip). No apparent complications.  Electronically Signed: By: Harmon Pier M.D. On: 10/29/2022 13:29   MM CLIP PLACEMENT RIGHT  Result Date: 10/29/2022 CLINICAL DATA:  Evaluate placement of biopsy clips following bilateral ultrasound-guided breast biopsies. EXAM: 3D DIAGNOSTIC BILATERAL MAMMOGRAM POST ULTRASOUND BIOPSY COMPARISON:  Previous exam(s). FINDINGS: 3D Mammographic images were obtained following ultrasound guided biopsy of a 0.9 cm 9:30 position RIGHT breast mass (RIBBON clip), a 1.2 cm 11:30 position RIGHT breast mass (VENUS  clip), a 0.7 cm INNER RETROAREOLAR RIGHT breast mass (HEART clip), and a 0.9 cm INNER LEFT breast mass (RIBBON clip). The RIBBON biopsy marking clip is in expected position at the site of biopsy within the UPPER-OUTER RIGHT breast. The VENUS biopsy marking clip is in expected position at the site of biopsy within the UPPER-OUTER RIGHT breast. The HEART biopsy marking clip is in expected position at the site of biopsy within the RETROAREOLAR RIGHT breast The RIBBON biopsy marking clip is in expected position at the site of biopsy within the INNER LEFT breast IMPRESSION: Appropriate positioning of the RIBBON shaped biopsy marking clip at the site of biopsy in the UPPER OUTER RIGHT breast (9:30 position RIGHT breast mass). Appropriate positioning of the VENUS biopsy marking clip at the site of biopsy within the UPPER-OUTER RIGHT breast (11:30 position RIGHT breast mass). Appropriate positioning of the HEART biopsy marking clip at the site of biopsy within the RETROAREOLAR RIGHT breast (RETROAREOLAR RIGHT breast mass). Appropriate positioning of the RIBBON shaped biopsy marking clip at the site of biopsy within the INNER LEFT breast. Final Assessment: Post Procedure Mammograms for Marker Placement Electronically Signed   By: Harmon Pier M.D.   On: 10/29/2022 13:34  MM CLIP PLACEMENT LEFT  Result Date: 10/29/2022 CLINICAL DATA:  Evaluate placement of biopsy clips following bilateral ultrasound-guided breast biopsies.  EXAM: 3D DIAGNOSTIC BILATERAL MAMMOGRAM POST ULTRASOUND BIOPSY COMPARISON:  Previous exam(s). FINDINGS: 3D Mammographic images were obtained following ultrasound guided biopsy of a 0.9 cm 9:30 position RIGHT breast mass (RIBBON clip), a 1.2 cm 11:30 position RIGHT breast mass (VENUS clip), a 0.7 cm INNER RETROAREOLAR RIGHT breast mass (HEART clip), and a 0.9 cm INNER LEFT breast mass (RIBBON clip). The RIBBON biopsy marking clip is in expected position at the site of biopsy within the UPPER-OUTER RIGHT breast. The VENUS biopsy marking clip is in expected position at the site of biopsy within the UPPER-OUTER RIGHT breast. The HEART biopsy marking clip is in expected position at the site of biopsy within the RETROAREOLAR RIGHT breast The RIBBON biopsy marking clip is in expected position at the site of biopsy within the INNER LEFT breast IMPRESSION: Appropriate positioning of the RIBBON shaped biopsy marking clip at the site of biopsy in the UPPER OUTER RIGHT breast (9:30 position RIGHT breast mass). Appropriate positioning of the VENUS biopsy marking clip at the site of biopsy within the UPPER-OUTER RIGHT breast (11:30 position RIGHT breast mass). Appropriate positioning of the HEART biopsy marking clip at the site of biopsy within the RETROAREOLAR RIGHT breast (RETROAREOLAR RIGHT breast mass). Appropriate positioning of the RIBBON shaped biopsy marking clip at the site of biopsy within the INNER LEFT breast. Final Assessment: Post Procedure Mammograms for Marker Placement Electronically Signed   By: Harmon Pier M.D.   On: 10/29/2022 13:34  [place] LIMITED ULTRASOUND INCLUDING AXILLA LEFT BREAST   Result Date: 10/29/2022 CLINICAL DATA:  32 year old female with newly diagnosed RIGHT breast cancer. For 2nd-look ultrasound following recent MRI demonstrating additional UPPER RIGHT breast mass, INNER RIGHT breast mass, UPPER OUTER LEFT breast mass and INNER LEFT breast mass. Patient had a previous LEFT breast ultrasound on  09/06/2019 demonstrating a mass at the 2 o'clock position. EXAM: ULTRASOUND OF THE BILATERAL BREAST COMPARISON:  09/06/2019 ultrasound.  10/25/2022 MRI. FINDINGS: Targeted ultrasound is performed, showing the following: RIGHT breast: A 1.2 x 0.4 x 0.9 cm circumscribed oval hypoechoic mass at the 11:30 position 7 cm from the nipple corresponding to the UPPER RIGHT breast  mass identified on MR. A 0.6 x 0.7 x 0.3 cm hypoechoic mass at the 3 o'clock position of the RETROAREOLAR RIGHT breast corresponding to the INNER RIGHT breast mass identified on MR. LEFT breast: A 1 x 0.4 x 0.9 cm mass at the 2 o'clock position 7 cm from the nipple, unchanged from 2021 and compatible with a benign mass and corresponding to the UPPER-OUTER LEFT breast mass identified on MR. A 0.9 x 0.4 x 0.8 cm circumscribed oval hypoechoic mass at the 9 o'clock position 4 cm from the nipple and corresponding to the INNER LEFT breast mass identified on MR. No abnormal axillary lymph nodes are noted. IMPRESSION: 1. Indeterminate 1.2 cm UPPER RIGHT breast mass, indeterminate 0.7 cm INNER RETROAREOLAR RIGHT breast mass, and indeterminate 0.9 cm INNER LEFT breast mass, corresponding to the recent MR findings. Tissue sampling of all 3 masses recommended. 2. Stable 1 cm UPPER-OUTER LEFT breast mass corresponding to the MR finding. Given this is unchanged from 09/06/2019, this is considered benign and no further follow-up recommended. RECOMMENDATION: Ultrasound-guided biopsies of indeterminate RIGHT breast masses and LEFT breast mass, which will be performed today but dictated in a separate report. I have discussed the findings and recommendations with the patient. If applicable, a reminder letter will be sent to the patient regarding the next appointment. BI-RADS CATEGORY  4: Suspicious. Electronically Signed   By: Harmon Pier M.D.   On: 10/29/2022 13:05  [place] LIMITED ULTRASOUND INCLUDING AXILLA RIGHT BREAST  Result Date: 10/29/2022 CLINICAL DATA:   32 year old female with newly diagnosed RIGHT breast cancer. For 2nd-look ultrasound following recent MRI demonstrating additional UPPER RIGHT breast mass, INNER RIGHT breast mass, UPPER OUTER LEFT breast mass and INNER LEFT breast mass. Patient had a previous LEFT breast ultrasound on 09/06/2019 demonstrating a mass at the 2 o'clock position. EXAM: ULTRASOUND OF THE BILATERAL BREAST COMPARISON:  09/06/2019 ultrasound.  10/25/2022 MRI. FINDINGS: Targeted ultrasound is performed, showing the following: RIGHT breast: A 1.2 x 0.4 x 0.9 cm circumscribed oval hypoechoic mass at the 11:30 position 7 cm from the nipple corresponding to the UPPER RIGHT breast mass identified on MR. A 0.6 x 0.7 x 0.3 cm hypoechoic mass at the 3 o'clock position of the RETROAREOLAR RIGHT breast corresponding to the INNER RIGHT breast mass identified on MR. LEFT breast: A 1 x 0.4 x 0.9 cm mass at the 2 o'clock position 7 cm from the nipple, unchanged from 2021 and compatible with a benign mass and corresponding to the UPPER-OUTER LEFT breast mass identified on MR. A 0.9 x 0.4 x 0.8 cm circumscribed oval hypoechoic mass at the 9 o'clock position 4 cm from the nipple and corresponding to the INNER LEFT breast mass identified on MR. No abnormal axillary lymph nodes are noted. IMPRESSION: 1. Indeterminate 1.2 cm UPPER RIGHT breast mass, indeterminate 0.7 cm INNER RETROAREOLAR RIGHT breast mass, and indeterminate 0.9 cm INNER LEFT breast mass, corresponding to the recent MR findings. Tissue sampling of all 3 masses recommended. 2. Stable 1 cm UPPER-OUTER LEFT breast mass corresponding to the MR finding. Given this is unchanged from 09/06/2019, this is considered benign and no further follow-up recommended. RECOMMENDATION: Ultrasound-guided biopsies of indeterminate RIGHT breast masses and LEFT breast mass, which will be performed today but dictated in a separate report. I have discussed the findings and recommendations with the patient. If  applicable, a reminder letter will be sent to the patient regarding the next appointment. BI-RADS CATEGORY  4: Suspicious. Electronically Signed   By: Tinnie Gens  Hu M.D.   On: 10/29/2022 13:05  NM Cardiac Muga Rest  Result Date: 10/28/2022 CLINICAL DATA:  RIGHT breast cancer, pre cardiotoxic chemotherapy EXAM: NUCLEAR MEDICINE CARDIAC BLOOD POOL IMAGING (MUGA) TECHNIQUE: Cardiac multi-gated acquisition was performed at rest following intravenous injection of Tc-25m labeled red blood cells. RADIOPHARMACEUTICALS:  21.94 mCi Tc-94m pertechnetate in-vitro labeled red blood cells IV COMPARISON:  None Available. FINDINGS: Calculated LEFT ventricular ejection fraction is 62.9%, normal. Study was obtained at a cardiac rate of 68 bpm. Patient was arrhythmic during imaging. Cine analysis of the LEFT ventricle in 3 projections demonstrates normal LV wall motion. IMPRESSION: Normal LEFT ventricular ejection fraction of 62.9% with normal LV wall motion. Electronically Signed   By: Ulyses Southward M.D.   On: 10/28/2022 13:25   MR Breast Bilateral W Wo Contrast  Result Date: 10/25/2022 CLINICAL DATA:  Patient with recent diagnosis right breast cancer. EXAM: BILATERAL BREAST MRI WITH AND WITHOUT CONTRAST TECHNIQUE: Multiplanar, multisequence MR images of both breasts were obtained prior to and following the intravenous administration of 5 ml of Gadavist Three-dimensional MR images were rendered by post-processing of the original MR data on an independent workstation. The three-dimensional MR images were interpreted, and findings are reported in the following complete MRI report for this study. Three dimensional images were evaluated at the independent interpreting workstation using the DynaCAD thin client. COMPARISON:  Previous exam(s). FINDINGS: Breast composition: d. Extreme fibroglandular tissue. Background parenchymal enhancement: Mild Right breast: Within the 12 o'clock position right breast there is a 0.9 cm lobular enhancing  mass. Within the 4 o'clock position left breast there is a 1.1 cm enhancing mass. Within the lower outer right breast there is a 1.7 x 1.0 cm enhancing mass compatible with biopsy-proven right breast carcinoma. Retropectoral implants. Left breast: Within the medial left breast there is a 1.1 cm oval enhancing mass. Within the upper-outer left breast there is a 0.8 cm oval enhancing mass. No additional concerning areas of enhancement identified within the left breast. Retropectoral implants. Lymph nodes: Two adjacent prominent right axillary lymph nodes measuring up to 5 mm in thickness. Ancillary findings:  Port-A-Cath. IMPRESSION: 1. Biopsy-proven right breast malignancy. 2. Within the right breast there are 2 additional enhancing masses. One is located in the 12 o'clock position and the other mass is located within the 4 o'clock position. 3. Two indeterminate masses within the left breast with one mass at the 9 o'clock position and other mass within the upper-outer left breast. 4. Two adjacent prominent right axillary lymph nodes. RECOMMENDATION: Recommend patient return for second-look ultrasound to evaluate MR findings. Attempt to locate the 2 additional masses within the right breast at the 12 o'clock position and 4 o'clock position. Attempt to locate the 2 additional masses within the left breast at the 9 o'clock position and upper-outer left breast. Reassess the right axilla for possible right axillary adenopathy. If any of these masses are not able to be assessed with ultrasound, MR biopsy of the mass would be recommended. BI-RADS CATEGORY  4: Suspicious. Electronically Signed   By: Annia Belt M.D.   On: 10/25/2022 11:21  DG Chest Port 1 View  Result Date: 10/24/2022 CLINICAL DATA:  [id number] S/P PICC central line placement [id number] EXAM: PORTABLE CHEST 1 VIEW COMPARISON:  None Available. FINDINGS: The cardiomediastinal silhouette is normal in contour.LEFT chest port with tip terminating over the superior  cavoatrial junction. No pleural effusion. No pneumothorax. No acute pleuroparenchymal abnormality. IMPRESSION: LEFT chest port with tip terminating over the superior cavoatrial  junction. Electronically Signed   By: Meda Klinefelter M.D.   On: 10/24/2022 09:22   DG C-Arm 1-60 Min-No Report  Result Date: 10/24/2022 Fluoroscopy was utilized by the requesting physician.  No radiographic interpretation.   [place] RT BREAST BX W LOC DEV 1ST LESION IMG BX SPEC US GUIDE  Addendum Date: 10/18/2022   ADDENDUM REPORT: 10/18/2022 12:11 ADDENDUM: PATHOLOGY revealed: BREAST, RIGHT AT 9:30 O'CLOCK, 5 CM FROM THE NIPPLE; ULTRASOUND-GUIDED CORE NEEDLE BIOPSY: - INVASIVE MAMMARY CARCINOMA, NO SPECIAL TYPE. 5 mm in this sample. Grade 3. Ductal carcinoma in situ: Not identified. Lymphovascular invasion: Not identified. Comment: Immunohistochemical studies were performed. Tumor cells are positive for GATA3, and negative for neuroendocrine markers chromogranin and synaptophysin. These findings support the above diagnosis. Pathology results are CONCORDANT with imaging findings, per Dr. Annia Belt. Pathology results and recommendations were discussed with patient via telephone on 10/17/2022. Patient reported biopsy site doing well with no adverse symptoms, and only slight tenderness at the site. Post biopsy care instructions were reviewed, questions were answered and my direct phone number was provided. Patient was instructed to call [facility] for any additional questions or concerns related to biopsy site. RECOMMENDATIONS: 1. Please schedule patient for additional RIGHT breast ULTRASOUND-GUIDED CORE NEEDLE BIOPSY for a probably benign oval hypoechoic mass right breast 9:30 o'clock 5 cm from the nipple measuring up to 0.9 cm. 2. Surgical and oncological consultation. Request for surgical and oncological consultation relayed to Irving Shows RN at [facility] by Randa Lynn RN on 10/17/2022. Pathology results  reported by Randa Lynn RN on 10/17/2022. Electronically Signed   By: Annia Belt M.D.   On: 10/18/2022 12:11   Result Date: 10/18/2022 CLINICAL DATA:  Indeterminate palpable right breast mass. EXAM: ULTRASOUND GUIDED RIGHT BREAST CORE NEEDLE BIOPSY COMPARISON:  Previous exam(s). PROCEDURE: I met with the patient and we discussed the procedure of ultrasound-guided biopsy, including benefits and alternatives. We discussed the high likelihood of a successful procedure. We discussed the risks of the procedure, including infection, bleeding, tissue injury, clip migration, and inadequate sampling. Informed written consent was given. The usual time-out protocol was performed immediately prior to the procedure. Lesion quadrant: Upper outer quadrant Using sterile technique and 1% Lidocaine as local anesthetic, under direct ultrasound visualization, a 14 gauge spring-loaded device was used to perform biopsy of right breast mass 9:30 o'clock using a lateral approach. At the conclusion of the procedure coil shaped tissue marker clip was deployed into the biopsy cavity. Follow up 2 view mammogram was performed and dictated separately. IMPRESSION: Ultrasound guided biopsy of right breast mass 9:30 o'clock. No apparent complications. Electronically Signed: By: Annia Belt M.D. On: 10/14/2022 09:08   MM CLIP PLACEMENT RIGHT  Result Date: 10/14/2022 CLINICAL DATA:  Status post ultrasound biopsy right breast mass EXAM: 3D DIAGNOSTIC RIGHT MAMMOGRAM POST ULTRASOUND BIOPSY COMPARISON:  Previous exam(s). FINDINGS: 3D Mammographic images were obtained following ultrasound guided biopsy of right breast mass 9:30 o'clock. The biopsy marking clip is in expected position at the site of biopsy. IMPRESSION: Appropriate positioning of the coil shaped biopsy marking clip at the site of biopsy in the right breast mass 9:30 o'clock. Final Assessment: Post Procedure Mammograms for Marker Placement Electronically Signed   By: Annia Belt M.D.    On: 10/14/2022 09:02    Assessment and plan- Patient is a 32 y.o. 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  She is here to  discuss MRI and pathology results and further management  MRI bilateral breast showed area of proven triple negative breast cancer in the lower outer quadrant of the right breast.  In addition to that there was a 0.9 cm mass at the 12 o'clock position and 1.1 cm enhancing mass at the 4 o'clock position of the right breast.  There were 2 indeterminate masses in the left breast as well.  2 prominent right axillary lymph nodes.  Patient had another ultrasound-guided biopsy.  At the time of ultrasound right axillary lymph nodes appeared normal.  3 additional biopsies in the right breast which looked concerning on the MRI were all negative for malignancy.  Left breast 9 o'clock position biopsy also negative for malignancy.  Based on MRI and biopsies the only proven area of malignancy is a 17 mm triple negative breast cancer.  All other concerning areas were negative for malignancy and results were deemed concordant.  I would therefore recommend neoadjuvant Adriamycin Cytoxan IV every 2 weeks for 4 cycles followed by weekly Taxol for 12 cycles for her.  This constitutes stage Ib disease and I therefore do not recommend doing neoadjuvant Keytruda or carboplatin as per keynote 522 regimen.  Discussed acetaminophens of chemotherapy including all but not limited to nausea, vomiting, low blood counts, risk of infections and hospitalizations.  Risk of neurotoxicity including peripheral neuropathy and infusion reaction associated with Taxol.  Risk of cardiotoxicity associated with Adriamycin.  Need for obtaining echocardiogram prior.  Treatment will be given with curative intent.  Patient understands and agrees to proceed as planned.   Cancer Staging  Malignant neoplasm of upper-outer quadrant of right  breast in female, estrogen receptor negative (HCC) Staging form: Breast, AJCC 8th Edition - Clinical stage from 10/22/2022: Stage IB (cT1c, cN0, cM0, G3, ER-, PR-, HER2-) - Signed by Creig Hines, MD on 10/22/2022 Stage prefix: Initial diagnosis Histologic grading system: 3 grade system     Visit Diagnosis 1. Malignant neoplasm of upper-outer quadrant of right breast in female, estrogen receptor negative (HCC)   2. Goals of care, counseling/discussion      Dr. Owens Shark, MD, MPH [facility] [phone] 11/10/2022 9:04 PM

## 2022-11-11 ENCOUNTER — Telehealth: Payer: Self-pay

## 2022-11-11 ENCOUNTER — Inpatient Hospital Stay: Payer: Self-pay

## 2022-11-11 ENCOUNTER — Ambulatory Visit: Payer: Self-pay

## 2022-11-11 DIAGNOSIS — Z171 Estrogen receptor negative status [ER-]: Secondary | ICD-10-CM

## 2022-11-11 DIAGNOSIS — Z5111 Encounter for antineoplastic chemotherapy: Secondary | ICD-10-CM | POA: Diagnosis not present

## 2022-11-11 MED ORDER — PEGFILGRASTIM-CBQV 6 MG/0.6ML ~~LOC~~ SOSY
6.0000 mg | PREFILLED_SYRINGE | Freq: Once | SUBCUTANEOUS | Status: AC
  Administered 2022-11-11: 6 mg via SUBCUTANEOUS
  Filled 2022-11-11: qty 0.6

## 2022-11-11 NOTE — Telephone Encounter (Signed)
Telephone call to patient for follow up after receiving first infusion.   Patient states infusion went great.  States eating good and drinking plenty of fluids.   Denies any nausea or vomiting.  Encouraged patient to call for any concerns or questions. 

## 2022-11-21 MED FILL — Dexamethasone Sodium Phosphate Inj 100 MG/10ML: INTRAMUSCULAR | Qty: 1 | Status: AC

## 2022-11-21 MED FILL — Fosaprepitant Dimeglumine For IV Infusion 150 MG (Base Eq): INTRAVENOUS | Qty: 5 | Status: AC

## 2022-11-22 ENCOUNTER — Encounter: Payer: Self-pay | Admitting: Oncology

## 2022-11-22 ENCOUNTER — Other Ambulatory Visit: Payer: Self-pay | Admitting: *Deleted

## 2022-11-22 ENCOUNTER — Encounter: Payer: Self-pay | Admitting: Nurse Practitioner

## 2022-11-22 ENCOUNTER — Inpatient Hospital Stay (HOSPITAL_BASED_OUTPATIENT_CLINIC_OR_DEPARTMENT_OTHER): Payer: Medicaid Other | Admitting: Nurse Practitioner

## 2022-11-22 ENCOUNTER — Inpatient Hospital Stay: Payer: Medicaid Other

## 2022-11-22 VITALS — BP 119/80 | HR 75 | Temp 97.5°F | Wt 122.0 lb

## 2022-11-22 DIAGNOSIS — M898X9 Other specified disorders of bone, unspecified site: Secondary | ICD-10-CM

## 2022-11-22 DIAGNOSIS — Z5111 Encounter for antineoplastic chemotherapy: Secondary | ICD-10-CM | POA: Diagnosis not present

## 2022-11-22 DIAGNOSIS — C50411 Malignant neoplasm of upper-outer quadrant of right female breast: Secondary | ICD-10-CM

## 2022-11-22 DIAGNOSIS — Z171 Estrogen receptor negative status [ER-]: Secondary | ICD-10-CM | POA: Diagnosis not present

## 2022-11-22 LAB — CMP (CANCER CENTER ONLY)
ALT: 13 U/L (ref 0–44)
AST: 22 U/L (ref 15–41)
Albumin: 3.9 g/dL (ref 3.5–5.0)
Alkaline Phosphatase: 79 U/L (ref 38–126)
Anion gap: 10 (ref 5–15)
BUN: 7 mg/dL (ref 6–20)
CO2: 23 mmol/L (ref 22–32)
Calcium: 8.7 mg/dL — ABNORMAL LOW (ref 8.9–10.3)
Chloride: 105 mmol/L (ref 98–111)
Creatinine: 0.58 mg/dL (ref 0.44–1.00)
GFR, Estimated: >60 mL/min (ref >60)
Glucose, Bld: 110 mg/dL — ABNORMAL HIGH (ref 70–99)
Potassium: 3.2 mmol/L — ABNORMAL LOW (ref 3.5–5.1)
Sodium: 138 mmol/L (ref 135–145)
Total Bilirubin: 0.4 mg/dL (ref 0.3–1.2)
Total Protein: 6.9 g/dL (ref 6.5–8.1)

## 2022-11-22 LAB — CBC WITH DIFFERENTIAL (CANCER CENTER ONLY)
Abs Immature Granulocytes: 2.69 10*3/uL — ABNORMAL HIGH (ref 0.00–0.07)
Basophils Absolute: 0 10*3/uL (ref 0.0–0.1)
Basophils Relative: 0 %
Eosinophils Absolute: 0.1 10*3/uL (ref 0.0–0.5)
Eosinophils Relative: 0 %
HCT: 34.8 % — ABNORMAL LOW (ref 36.0–46.0)
Hemoglobin: 11.5 g/dL — ABNORMAL LOW (ref 12.0–15.0)
Immature Granulocytes: 15 %
Lymphocytes Relative: 17 %
Lymphs Abs: 3 10*3/uL (ref 0.7–4.0)
MCH: 28.8 pg (ref 26.0–34.0)
MCHC: 33 g/dL (ref 30.0–36.0)
MCV: 87 fL (ref 80.0–100.0)
Monocytes Absolute: 1.7 10*3/uL — ABNORMAL HIGH (ref 0.1–1.0)
Monocytes Relative: 9 %
Neutro Abs: 10.8 10*3/uL — ABNORMAL HIGH (ref 1.7–7.7)
Neutrophils Relative %: 59 %
Platelet Count: 241 10*3/uL (ref 150–400)
RBC: 4 MIL/uL (ref 3.87–5.11)
RDW: 13.4 % (ref 11.5–15.5)
Smear Review: NORMAL
WBC Count: 18.2 10*3/uL — ABNORMAL HIGH (ref 4.0–10.5)
nRBC: 0.2 % (ref 0.0–0.2)

## 2022-11-22 LAB — PREGNANCY, URINE: Preg Test, Ur: NEGATIVE

## 2022-11-22 MED ORDER — SODIUM CHLORIDE 0.9 % IV SOLN
Freq: Once | INTRAVENOUS | Status: AC
  Filled 2022-11-22: qty 250

## 2022-11-22 MED ORDER — SODIUM CHLORIDE 0.9 % IV SOLN
600.0000 mg/m2 | Freq: Once | INTRAVENOUS | Status: AC
  Administered 2022-11-22: 1000 mg via INTRAVENOUS
  Filled 2022-11-22: qty 50

## 2022-11-22 MED ORDER — POTASSIUM CHLORIDE CRYS ER 20 MEQ PO TBCR: 20.0000 meq | EXTENDED_RELEASE_TABLET | Freq: Every day | ORAL | 0 refills | Status: DC

## 2022-11-22 MED ORDER — PALONOSETRON HCL INJECTION 0.25 MG/5ML
0.2500 mg | Freq: Once | INTRAVENOUS | Status: AC
  Administered 2022-11-22: 0.25 mg via INTRAVENOUS
  Filled 2022-11-22: qty 5

## 2022-11-22 MED ORDER — HEPARIN SOD (PORK) LOCK FLUSH 100 UNIT/ML IV SOLN
500.0000 [IU] | Freq: Once | INTRAVENOUS | Status: AC | PRN
  Administered 2022-11-22: 500 [IU]
  Filled 2022-11-22: qty 5

## 2022-11-22 MED ORDER — DOXORUBICIN HCL CHEMO IV INJECTION 2 MG/ML
60.0000 mg/m2 | Freq: Once | INTRAVENOUS | Status: AC
  Administered 2022-11-22: 94 mg via INTRAVENOUS
  Filled 2022-11-22: qty 47

## 2022-11-22 MED ORDER — SODIUM CHLORIDE 0.9 % IV SOLN
150.0000 mg | Freq: Once | INTRAVENOUS | Status: AC
  Administered 2022-11-22: 150 mg via INTRAVENOUS
  Filled 2022-11-22: qty 150

## 2022-11-22 MED ORDER — SODIUM CHLORIDE 0.9 % IV SOLN
10.0000 mg | Freq: Once | INTRAVENOUS | Status: AC
  Administered 2022-11-22: 10 mg via INTRAVENOUS
  Filled 2022-11-22: qty 10

## 2022-11-22 NOTE — Progress Notes (Signed)
WBC 18.2, ANC 10.8, Per Consuello Masse proceed with Udenyca injection as scheduled on Monday 11/25/2022.

## 2022-11-22 NOTE — Patient Instructions (Signed)
[facility]  Discharge Instructions: Thank you for choosing [facility] to provide your oncology and hematology care.  If you have a lab appointment with the Cancer Center, please go directly to the Cancer Center and check in at the registration area.  Wear comfortable clothing and clothing appropriate for easy access to any Portacath or PICC line.   We strive to give you quality time with your provider. You may need to reschedule your appointment if you arrive late (15 or more minutes).  Arriving late affects you and other patients whose appointments are after yours.  Also, if you miss three or more appointments without notifying the office, you may be dismissed from the clinic at the provider's discretion.      For prescription refill requests, have your pharmacy contact our office and allow 72 hours for refills to be completed.    Today you received the following chemotherapy and/or immunotherapy agents adriamycin and cytoxan      To help prevent nausea and vomiting after your treatment, we encourage you to take your nausea medication as directed.  BELOW ARE SYMPTOMS THAT SHOULD BE REPORTED IMMEDIATELY: *FEVER GREATER THAN 100.4 F (38 C) OR HIGHER *CHILLS OR SWEATING *NAUSEA AND VOMITING THAT IS NOT CONTROLLED WITH YOUR NAUSEA MEDICATION *UNUSUAL SHORTNESS OF BREATH *UNUSUAL BRUISING OR BLEEDING *URINARY PROBLEMS (pain or burning when urinating, or frequent urination) *BOWEL PROBLEMS (unusual diarrhea, constipation, pain near the anus) TENDERNESS IN MOUTH AND THROAT WITH OR WITHOUT PRESENCE OF ULCERS (sore throat, sores in mouth, or a toothache) UNUSUAL RASH, SWELLING OR PAIN  UNUSUAL VAGINAL DISCHARGE OR ITCHING   Items with * indicate a potential emergency and should be followed up as soon as possible or go to the Emergency Department if any problems should occur.  Please show the CHEMOTHERAPY ALERT CARD or IMMUNOTHERAPY ALERT CARD at  check-in to the Emergency Department and triage nurse.  Should you have questions after your visit or need to cancel or reschedule your appointment, please contact [facility]  [phone] and follow the prompts.  Office hours are 8:00 a.m. to 4:30 p.m. Monday - Friday. Please note that voicemails left after 4:00 p.m. may not be returned until the following business day.  We are closed weekends and major holidays. You have access to a nurse at all times for urgent questions. Please call the main number to the clinic [phone] and follow the prompts.  For any non-urgent questions, you may also contact your provider using MyChart. We now offer e-Visits for anyone 49 and older to request care online for non-urgent symptoms. For details visit [email or web address].   Also download the MyChart app! Go to the app store, search "MyChart", open the app, select [place], and log in with your MyChart username and password.

## 2022-11-22 NOTE — Progress Notes (Signed)
Hematology/Oncology Consult Note [facility]  Telephone:([phone] [phone]  Patient Care Team: Glori Luis, MD as PCP - General (Family Medicine) Hulen Luster, RN as Oncology Nurse Navigator   Name of the patient: Eileen Hart  [id number]  [birth year]   Date of visit: 11/22/22  Diagnosis-clinical prognostic stage Ib invasive mammary carcinoma of the right breast cT1 cN0 M0 triple negative  Chief complaint/ Reason for visit-on treatment assessment prior to cycle 2 of neoadjuvant AC chemotherapy  Heme/Onc history: Patient is a 32 year old female who underwent a bilateral diagnostic mammogram on 10/07/2022 after she felt a palpable area of concern in her right breast. Mammogram showed a 1.6 x 1 x 1.5 cm solid mass in the right breast 9:30 position 5 cm from the nipple. There was also an adjacent 6 x 9 x 2 mm mass in the right breast. No right axillary adenopathy. The dominant mass was biopsied and was consistent with invasive mammary carcinoma grade 3 ER and HER2 negative.   Bilateral breast MRI showed additional areas of concern in the right breast.  2 additional enhancing masses 1 in the 12 o'clock position and 1 in the 4 o'clock position.  2 adjacent prominent right axillary lymph nodes.  2 intermittent masses in the left breast 9 o'clock position and upper outer left breast.  This was followed by a dedicated ultrasound.  She had 3 breast biopsies in the right side and 1 left breast biopsy and all of them were negative for malignancy.  On the ultrasound her right axillary lymph nodes appeared normal and therefore biopsy was not recommended for the same.  PET CT scan showed focal hypermetabolic activity in the right breast adjacent to the biopsy clip but no other additional areas of concern.  Baseline MUGA scan showed normal EF.  Interval history- Patient is 32 year old female patient diagnosed with triple negative breast cancer, currently undergoing  neoadjuvant chemotherapy who returns to clinic for consideration of cycle 2 of chemotherapy. She tolerated cycle 1 well without significant side effects. Some mild nausea and fatigue after treatment which was controlled with antiemetics. No complaints today.   ECOG PS- 0 Pain scale- 0  Review of systems- Review of Systems  Constitutional:  Negative for chills, fever, malaise/fatigue and weight loss.  HENT:  Negative for congestion, ear discharge and nosebleeds.   Eyes:  Negative for blurred vision.  Respiratory:  Negative for cough, hemoptysis, sputum production, shortness of breath and wheezing.   Cardiovascular:  Negative for chest pain, palpitations, orthopnea and claudication.  Gastrointestinal:  Negative for abdominal pain, blood in stool, constipation, diarrhea, heartburn, melena, nausea and vomiting.  Genitourinary:  Negative for dysuria, flank pain, frequency, hematuria and urgency.  Musculoskeletal:  Negative for back pain, joint pain and myalgias.  Skin:  Negative for rash.  Neurological:  Negative for dizziness, tingling, focal weakness, seizures, weakness and headaches.  Endo/Heme/Allergies:  Does not bruise/bleed easily.  Psychiatric/Behavioral:  Negative for depression and suicidal ideas. The patient does not have insomnia.      No Known Allergies   Past Medical History:  Diagnosis Date   Chlamydia 01/25/2010   Depression with anxiety    Gestational hypertension    Immunization, viral disease    gardasil series completed   Tobacco abuse 05/02/2016     Past Surgical History:  Procedure Laterality Date   AUGMENTATION MAMMAPLASTY Bilateral    saline implants 2015   BREAST BIOPSY Right 10/14/2022   US biopsy/ coil clip/  path pending   BREAST BIOPSY Right 10/14/2022   [place] RT BREAST BX W LOC DEV 1ST LESION IMG BX SPEC US GUIDE 10/14/2022 [facility]   BREAST BIOPSY Left 10/29/2022   [place] Core Bx Ribbon clip- path pending   BREAST BIOPSY Right 10/29/2022   [place] Core  1130 Venus Clip- path pending   BREAST BIOPSY Right 10/29/2022   [place] Core Bx Retroareolar heart clip-path pending   BREAST BIOPSY Right 10/29/2022   [place] Core Bx Ribbon Clip path pending   BREAST BIOPSY Right 10/29/2022   [place] RT BREAST BX W LOC DEV EA ADD LESION IMG BX SPEC US GUIDE 10/29/2022 [facility]   BREAST BIOPSY Right 10/29/2022   [place] RT BREAST BX W LOC DEV 1ST LESION IMG BX SPEC US GUIDE 10/29/2022 [facility]   BREAST BIOPSY Right 10/29/2022   [place] RT BREAST BX W LOC DEV EA ADD LESION IMG BX SPEC US GUIDE 10/29/2022 [facility]   BREAST BIOPSY Left 10/29/2022   [place] LT BREAST BX W LOC DEV 1ST LESION IMG BX SPEC US GUIDE 10/29/2022 [facility]   BREAST ENHANCEMENT SURGERY  2015   CESAREAN SECTION  2014   PORTACATH PLACEMENT N/A 10/24/2022   Procedure: INSERTION PORT-A-CATH;  Surgeon: Leafy Ro, MD;  Location: [facility];  Service: General;  Laterality: N/A;   TONSILLECTOMY      Social History   Socioeconomic History   Marital status: Single    Spouse name: Not on file   Number of children: 1   Years of education: 15   Highest education level: Not on file  Occupational History   Occupation: Groomer    Comment: [employer]  Tobacco Use   Smoking status: Former    Packs/day: .5    Types: Cigarettes    Quit date: 07/25/2018    Years since quitting: 4.3   Smokeless tobacco: Never   Tobacco comments:    quit 07/2018  Vaping Use   Vaping Use: Never used  Substance and Sexual Activity   Alcohol use: No    Alcohol/week: 0.0 standard drinks of alcohol   Drug use: No   Sexual activity: Yes    Birth control/protection: Pill  Other Topics Concern   Not on file  Social History Narrative   Not on file   Social Determinants of Health   Financial Resource Strain: Not on file  Food Insecurity: No Food Insecurity (10/22/2022)   Hunger Vital Sign    Worried About Running Out of Food in the Last Year: Never true    Ran Out of Food in the Last Year: Never true   Transportation Needs: No Transportation Needs (10/22/2022)   PRAPARE - Administrator, Civil Service (Medical): No    Lack of Transportation (Non-Medical): No  Physical Activity: Not on file  Stress: Not on file  Social Connections: Not on file  Intimate Partner Violence: Not At Risk (10/22/2022)   Humiliation, Afraid, Rape, and Kick questionnaire    Fear of Current or Ex-Partner: No    Emotionally Abused: No    Physically Abused: No    Sexually Abused: No    Family History  Problem Relation Age of Onset   Breast cancer Other 35       mat 2nd cousin   Diabetes Neg Hx    Heart disease Neg Hx    Hypertension Neg Hx    Ovarian cancer Neg Hx    Colon cancer Neg Hx      Current Outpatient  Medications:    dexamethasone (DECADRON) 4 MG tablet, Take 2 tablets (8 mg total) by mouth daily for 3 days. Start the day after doxorubicin/cyclophosphamide chemotherapy. Take with food., Disp: 30 tablet, Rfl: 1   escitalopram (LEXAPRO) 10 MG tablet, TAKE 1 TABLET BY MOUTH EVERY DAY, Disp: 90 tablet, Rfl: 2   HYDROcodone-acetaminophen (NORCO/VICODIN) 5-325 MG tablet, Take 1-2 tablets by mouth every 6 (six) hours as needed for moderate pain., Disp: 15 tablet, Rfl: 0   lidocaine-prilocaine (EMLA) cream, Apply to affected area once, Disp: 30 g, Rfl: 3   norgestimate-ethinyl estradiol (ORTHO-CYCLEN) 0.25-35 MG-MCG tablet, Take 1 tablet by mouth daily., Disp: 84 tablet, Rfl: 3   ondansetron (ZOFRAN) 8 MG tablet, Take 1 tab (8 mg) by mouth every 8 hrs as needed for nausea/vomiting. Start third day after doxorubicin/cyclophosphamide chemotherapy., Disp: 30 tablet, Rfl: 1   prochlorperazine (COMPAZINE) 10 MG tablet, Take 1 tablet (10 mg total) by mouth every 6 (six) hours as needed for nausea or vomiting., Disp: 30 tablet, Rfl: 1  Physical exam:  Vitals:   11/22/22 0903  BP: 119/80  Pulse: 75  Temp: (!) 97.5 F (36.4 C)  TempSrc: Tympanic  Weight: 122 lb (55.3 kg)    Physical  Exam Vitals reviewed.  Constitutional:      Comments: Accompanied by younger sister  HENT:     Head: Normocephalic.  Eyes:     General: No scleral icterus. Cardiovascular:     Rate and Rhythm: Normal rate and regular rhythm.  Pulmonary:     Effort: Pulmonary effort is normal.  Abdominal:     General: There is no distension.     Palpations: Abdomen is soft.  Skin:    General: Skin is warm.     Coloration: Skin is not pale.  Neurological:     Mental Status: She is alert and oriented to person, place, and time.  Psychiatric:        Mood and Affect: Mood normal.        Behavior: Behavior normal.         Latest Ref Rng & Units 11/22/2022    8:43 AM  CMP  Glucose 70 - 99 mg/dL 161   BUN 6 - 20 mg/dL 7   Creatinine 0.96 - 0.45 mg/dL 4.09   Sodium 811 - 914 mmol/L 138   Potassium 3.5 - 5.1 mmol/L 3.2   Chloride 98 - 111 mmol/L 105   CO2 22 - 32 mmol/L 23   Calcium 8.9 - 10.3 mg/dL 8.7   Total Protein 6.5 - 8.1 g/dL 6.9   Total Bilirubin 0.3 - 1.2 mg/dL 0.4   Alkaline Phos 38 - 126 U/L 79   AST 15 - 41 U/L 22   ALT 0 - 44 U/L 13       Latest Ref Rng & Units 11/22/2022    8:43 AM  CBC  WBC 4.0 - 10.5 K/uL 18.2   Hemoglobin 12.0 - 15.0 g/dL 78.2   Hematocrit 95.6 - 46.0 % 34.8   Platelets 150 - 400 K/uL 241     No images are attached to the encounter.  NM PET Image Initial (PI) Skull Base To Thigh  Result Date: 11/06/2022 CLINICAL DATA:  Initial treatment strategy for newly diagnosed right breast cancer. EXAM: NUCLEAR MEDICINE PET SKULL BASE TO THIGH TECHNIQUE: 6.73 mCi F-18 FDG was injected intravenously. Full-ring PET imaging was performed from the skull base to thigh after the radiotracer. CT data was obtained and used for attenuation  correction and anatomic localization. Fasting blood glucose: 82 mg/dl COMPARISON:  No prior relevant cross-sectional imaging. Recent right breast imaging is correlated. FINDINGS: Mediastinal blood pool activity: SUV max 1.6 NECK: No  hypermetabolic cervical lymph nodes are identified.Fairly symmetric activity within the lymphoid tissue of Waldeyer's ring is within physiologic limits.No suspicious activity identified within the pharyngeal mucosal space. Incidental CT findings: none CHEST: There are no hypermetabolic mediastinal, hilar, axillary or internal mammary lymph nodes. There is focal hypermetabolic activity laterally in the right breast adjacent to a biopsy clip, demonstrating an SUV max of 4.3. No other hypermetabolic activity is seen within the breasts or chest wall. No hypermetabolic pulmonary activity or suspicious nodularity. Incidental CT findings: Left IJ Port-A-Cath extends to the superior cavoatrial junction. Small amount of residual thymic tissue in the anterior mediastinum. There are bilateral subpectoral breast implants. ABDOMEN/PELVIS: There is no hypermetabolic activity within the liver, adrenal glands, spleen or pancreas. There is no hypermetabolic nodal activity in the abdomen or pelvis. There is tubular activity in the pelvis bilaterally. On the right, this is posteriorly located and may relate to the right ureter or adnexa. On the left, this is anteriorly located and appears adnexal within SUV max of 16.3. Incidental CT findings: Possible punctate renal calculi bilaterally. No hydronephrosis, ascites or peritoneal nodularity. SKELETON: There is no hypermetabolic activity to suggest osseous metastatic disease. Incidental CT findings: none IMPRESSION: 1. Focal hypermetabolic activity laterally in the right breast consistent with known breast cancer. 2. No evidence of metastatic disease. 3. Indeterminate tubular activity in the pelvis bilaterally, likely physiologic. Consider further evaluation with pelvic ultrasound, as clinically warranted. 4. Possible punctate renal calculi bilaterally. Electronically Signed   By: Carey Bullocks M.D.   On: 11/06/2022 16:39   [place] RT BREAST BX W LOC DEV 1ST LESION IMG BX SPEC US  GUIDE  Addendum Date: 11/04/2022   ADDENDUM REPORT: 11/04/2022 12:43 ADDENDUM: PATHOLOGY revealed: BREAST, RIGHT 9:30 5 CMFN; ULTRASOUND-GUIDED BIOPSY: BENIGN BREAST TISSUE WITH FIBROUS STROMA, SCLEROSING ADENOSIS, AND FOCAL STROMAL HEMORRHAGE. NEGATIVE FOR ATYPIA AND MALIGNANCY. Pathology results are CONCORDANT with imaging findings, per Dr. Harmon Pier. PATHOLOGY revealed: BREAST, RIGHT 11:30 5 CM FN; ULTRASOUND-GUIDED BIOPSY: FIBROADENOMA. BACKGROUND BENIGN BREAST TISSUE WITH FOCAL LACTATIONAL CHANGE. NEGATIVE FOR ATYPIA AND MALIGNANCY. Pathology results are CONCORDANT with imaging findings, per Dr. Harmon Pier. PATHOLOGY revealed: BREAST, RIGHT RETROAREOLAR 3:00; ULTRASOUND-GUIDED BIOPSY: BENIGN BREAST TISSUE WITH FIBROUS STROMA, SCLEROSING ADENOSIS, AND FOCAL STROMAL HEMORRHAGE. NEGATIVE FOR ATYPIA AND MALIGNANCY. Pathology results are CONCORDANT with imaging findings, per Dr. Harmon Pier. PATHOLOGY revealed: BREAST, LEFT 9:00 4 CM FN; ULTRASOUND-GUIDED BIOPSY: FIBROADENOMA. NEGATIVE FOR ATYPIA AND MALIGNANCY. Pathology results are CONCORDANT with imaging findings, per Dr. Harmon Pier. Pathology results and recommendations below were discussed with patient by telephone on 10/30/22. Patient reported biopsy site within normal limits with slight tenderness at the site. Post biopsy care instructions were reviewed, questions were answered and my direct phone number was provided to patient. Patient was instructed to call [facility] if any concerns or questions arise related to the biopsy. RECOMMENDATION: Patient instructed to continue monthly self breast examinations and to return to screenings at the recommendation of her oncologist. Pathology results reported by Hezzie Bump, RN 10/30/2022. Electronically Signed   By: Harmon Pier M.D.   On: 11/04/2022 12:43   Result Date: 11/04/2022 CLINICAL DATA:  32 year old female with newly diagnosed RIGHT breast cancer. Patient presents for tissue sampling of 3  RIGHT breast masses and 1 LEFT breast mass (RIGHT breast mass  identified on 10/07/2022 ultrasound -9:30 position mass, as well as 2 additional RIGHT breast masses and 1 LEFT breast mass identified on 2nd look ultrasound from MR findings. EXAM: ULTRASOUND GUIDED RIGHT BREAST CORE NEEDLE BIOPSY X 3 ULTRASOUND GUIDED LEFT BREAST CORE NEEDLE BIOPSY COMPARISON:  Previous exam(s). PROCEDURE: I met with the patient and we discussed the procedure of ultrasound-guided biopsy, including benefits and alternatives. We discussed the high likelihood of a successful procedure. We discussed the risks of the procedure, including moderate chance of implant damage, infection, bleeding, tissue injury, clip migration, and inadequate sampling. Informed written consent was given. The usual time-out protocol was performed immediately prior to the procedure. ULTRASOUND GUIDED RIGHT BREAST CORE NEEDLE BIOPSY #1 (0.9 cm mass at 9:30 position-RIBBON clip): Lesion quadrant: UPPER-OUTER RIGHT breast Using sterile technique and 1% Lidocaine with and without epinephrine as local anesthetic, under direct ultrasound visualization, a 14 gauge spring-loaded device was used to perform biopsy of the 0.9 cm mass at the 9:30 position of the RIGHT breast using a LATERAL approach. At the conclusion of the procedure a RIBBON shaped tissue marker clip was deployed into the biopsy cavity. Follow up 2 view mammogram was performed and dictated separately. ULTRASOUND GUIDED RIGHT BREAST CORE NEEDLE BIOPSY #2 (1.2 cm mass at the 11:30 position-VENUS clip): Lesion quadrant: UPPER-OUTER RIGHT breast Using sterile technique and 1% Lidocaine with and without epinephrine as local anesthetic, under direct ultrasound visualization, a 14 gauge spring-loaded device was used to perform biopsy of the 1.2 cm mass at the 11:30 position of the RIGHT breast using a LATERAL approach. At the conclusion of the procedure a VENUS shaped tissue marker clip was deployed into the biopsy  cavity. Follow up 2 view mammogram was performed and dictated separately. ULTRASOUND GUIDED RIGHT BREAST CORE NEEDLE BIOPSY #3 (0.7 cm 3 o'clock position RETROAREOLAR mass-HEART clip): Using sterile technique and 1% Lidocaine with and without epinephrine as local anesthetic, under direct ultrasound visualization, a 14 gauge spring-loaded device was used to perform biopsy of the 0.7 cm mass at the 3 o'clock position of the RETROAREOLAR RIGHT breast using a SUPERIOR approach. At the conclusion of the procedure a HEART shaped tissue marker clip was deployed into the biopsy cavity. Follow up 2 view mammogram was performed and dictated separately. ULTRASOUND GUIDED LEFT BREAST CORE NEEDLE BIOPSY (0.9 cm 9 o'clock position mass-RIBBON clip): Using sterile technique and 1% Lidocaine with and without epinephrine as local anesthetic, under direct ultrasound visualization, a 14 gauge spring-loaded device was used to perform biopsy of the 0.9 cm mass at the 9 o'clock position of the LEFT breast using a MEDIAL approach. At the conclusion of the procedure RIBBON shaped tissue marker clip was deployed into the biopsy cavity. Follow up 2 view mammogram was performed and dictated separately. IMPRESSION: Ultrasound guided biopsy of 0.9 cm RIGHT breast mass at the 9:30 position (RIBBON clip). Ultrasound-guided biopsy of 1.2 cm RIGHT breast mass at the 11:30 position (VENUS clip). Ultrasound-guided biopsy 0.7 cm INNER RETROAREOLAR RIGHT breast mass (HEART clip). Ultrasound guided biopsy of 0.9 cm INNER LEFT breast mass (RIBBON clip). No apparent complications. Electronically Signed: By: Harmon Pier M.D. On: 10/29/2022 13:29   [place] LT BREAST BX W LOC DEV 1ST LESION IMG BX SPEC US GUIDE  Addendum Date: 11/04/2022   ADDENDUM REPORT: 11/04/2022 12:43 ADDENDUM: PATHOLOGY revealed: BREAST, RIGHT 9:30 5 CMFN; ULTRASOUND-GUIDED BIOPSY: BENIGN BREAST TISSUE WITH FIBROUS STROMA, SCLEROSING ADENOSIS, AND FOCAL STROMAL HEMORRHAGE. NEGATIVE FOR  ATYPIA AND MALIGNANCY. Pathology results are CONCORDANT  with imaging findings, per Dr. Harmon Pier. PATHOLOGY revealed: BREAST, RIGHT 11:30 5 CM FN; ULTRASOUND-GUIDED BIOPSY: FIBROADENOMA. BACKGROUND BENIGN BREAST TISSUE WITH FOCAL LACTATIONAL CHANGE. NEGATIVE FOR ATYPIA AND MALIGNANCY. Pathology results are CONCORDANT with imaging findings, per Dr. Harmon Pier. PATHOLOGY revealed: BREAST, RIGHT RETROAREOLAR 3:00; ULTRASOUND-GUIDED BIOPSY: BENIGN BREAST TISSUE WITH FIBROUS STROMA, SCLEROSING ADENOSIS, AND FOCAL STROMAL HEMORRHAGE. NEGATIVE FOR ATYPIA AND MALIGNANCY. Pathology results are CONCORDANT with imaging findings, per Dr. Harmon Pier. PATHOLOGY revealed: BREAST, LEFT 9:00 4 CM FN; ULTRASOUND-GUIDED BIOPSY: FIBROADENOMA. NEGATIVE FOR ATYPIA AND MALIGNANCY. Pathology results are CONCORDANT with imaging findings, per Dr. Harmon Pier. Pathology results and recommendations below were discussed with patient by telephone on 10/30/22. Patient reported biopsy site within normal limits with slight tenderness at the site. Post biopsy care instructions were reviewed, questions were answered and my direct phone number was provided to patient. Patient was instructed to call [facility] if any concerns or questions arise related to the biopsy. RECOMMENDATION: Patient instructed to continue monthly self breast examinations and to return to screenings at the recommendation of her oncologist. Pathology results reported by Hezzie Bump, RN 10/30/2022. Electronically Signed   By: Harmon Pier M.D.   On: 11/04/2022 12:43   Result Date: 11/04/2022 CLINICAL DATA:  32 year old female with newly diagnosed RIGHT breast cancer. Patient presents for tissue sampling of 3 RIGHT breast masses and 1 LEFT breast mass (RIGHT breast mass identified on 10/07/2022 ultrasound -9:30 position mass, as well as 2 additional RIGHT breast masses and 1 LEFT breast mass identified on 2nd look ultrasound from MR findings. EXAM: ULTRASOUND  GUIDED RIGHT BREAST CORE NEEDLE BIOPSY X 3 ULTRASOUND GUIDED LEFT BREAST CORE NEEDLE BIOPSY COMPARISON:  Previous exam(s). PROCEDURE: I met with the patient and we discussed the procedure of ultrasound-guided biopsy, including benefits and alternatives. We discussed the high likelihood of a successful procedure. We discussed the risks of the procedure, including moderate chance of implant damage, infection, bleeding, tissue injury, clip migration, and inadequate sampling. Informed written consent was given. The usual time-out protocol was performed immediately prior to the procedure. ULTRASOUND GUIDED RIGHT BREAST CORE NEEDLE BIOPSY #1 (0.9 cm mass at 9:30 position-RIBBON clip): Lesion quadrant: UPPER-OUTER RIGHT breast Using sterile technique and 1% Lidocaine with and without epinephrine as local anesthetic, under direct ultrasound visualization, a 14 gauge spring-loaded device was used to perform biopsy of the 0.9 cm mass at the 9:30 position of the RIGHT breast using a LATERAL approach. At the conclusion of the procedure a RIBBON shaped tissue marker clip was deployed into the biopsy cavity. Follow up 2 view mammogram was performed and dictated separately. ULTRASOUND GUIDED RIGHT BREAST CORE NEEDLE BIOPSY #2 (1.2 cm mass at the 11:30 position-VENUS clip): Lesion quadrant: UPPER-OUTER RIGHT breast Using sterile technique and 1% Lidocaine with and without epinephrine as local anesthetic, under direct ultrasound visualization, a 14 gauge spring-loaded device was used to perform biopsy of the 1.2 cm mass at the 11:30 position of the RIGHT breast using a LATERAL approach. At the conclusion of the procedure a VENUS shaped tissue marker clip was deployed into the biopsy cavity. Follow up 2 view mammogram was performed and dictated separately. ULTRASOUND GUIDED RIGHT BREAST CORE NEEDLE BIOPSY #3 (0.7 cm 3 o'clock position RETROAREOLAR mass-HEART clip): Using sterile technique and 1% Lidocaine with and without epinephrine  as local anesthetic, under direct ultrasound visualization, a 14 gauge spring-loaded device was used to perform biopsy of the 0.7 cm mass at the 3 o'clock position of the RETROAREOLAR  RIGHT breast using a SUPERIOR approach. At the conclusion of the procedure a HEART shaped tissue marker clip was deployed into the biopsy cavity. Follow up 2 view mammogram was performed and dictated separately. ULTRASOUND GUIDED LEFT BREAST CORE NEEDLE BIOPSY (0.9 cm 9 o'clock position mass-RIBBON clip): Using sterile technique and 1% Lidocaine with and without epinephrine as local anesthetic, under direct ultrasound visualization, a 14 gauge spring-loaded device was used to perform biopsy of the 0.9 cm mass at the 9 o'clock position of the LEFT breast using a MEDIAL approach. At the conclusion of the procedure RIBBON shaped tissue marker clip was deployed into the biopsy cavity. Follow up 2 view mammogram was performed and dictated separately. IMPRESSION: Ultrasound guided biopsy of 0.9 cm RIGHT breast mass at the 9:30 position (RIBBON clip). Ultrasound-guided biopsy of 1.2 cm RIGHT breast mass at the 11:30 position (VENUS clip). Ultrasound-guided biopsy 0.7 cm INNER RETROAREOLAR RIGHT breast mass (HEART clip). Ultrasound guided biopsy of 0.9 cm INNER LEFT breast mass (RIBBON clip). No apparent complications. Electronically Signed: By: Harmon Pier M.D. On: 10/29/2022 13:29   [place] RT BREAST BX W LOC DEV EA ADD LESION IMG BX SPEC US GUIDE  Addendum Date: 11/04/2022   ADDENDUM REPORT: 11/04/2022 12:43 ADDENDUM: PATHOLOGY revealed: BREAST, RIGHT 9:30 5 CMFN; ULTRASOUND-GUIDED BIOPSY: BENIGN BREAST TISSUE WITH FIBROUS STROMA, SCLEROSING ADENOSIS, AND FOCAL STROMAL HEMORRHAGE. NEGATIVE FOR ATYPIA AND MALIGNANCY. Pathology results are CONCORDANT with imaging findings, per Dr. Harmon Pier. PATHOLOGY revealed: BREAST, RIGHT 11:30 5 CM FN; ULTRASOUND-GUIDED BIOPSY: FIBROADENOMA. BACKGROUND BENIGN BREAST TISSUE WITH FOCAL LACTATIONAL CHANGE.  NEGATIVE FOR ATYPIA AND MALIGNANCY. Pathology results are CONCORDANT with imaging findings, per Dr. Harmon Pier. PATHOLOGY revealed: BREAST, RIGHT RETROAREOLAR 3:00; ULTRASOUND-GUIDED BIOPSY: BENIGN BREAST TISSUE WITH FIBROUS STROMA, SCLEROSING ADENOSIS, AND FOCAL STROMAL HEMORRHAGE. NEGATIVE FOR ATYPIA AND MALIGNANCY. Pathology results are CONCORDANT with imaging findings, per Dr. Harmon Pier. PATHOLOGY revealed: BREAST, LEFT 9:00 4 CM FN; ULTRASOUND-GUIDED BIOPSY: FIBROADENOMA. NEGATIVE FOR ATYPIA AND MALIGNANCY. Pathology results are CONCORDANT with imaging findings, per Dr. Harmon Pier. Pathology results and recommendations below were discussed with patient by telephone on 10/30/22. Patient reported biopsy site within normal limits with slight tenderness at the site. Post biopsy care instructions were reviewed, questions were answered and my direct phone number was provided to patient. Patient was instructed to call [facility] if any concerns or questions arise related to the biopsy. RECOMMENDATION: Patient instructed to continue monthly self breast examinations and to return to screenings at the recommendation of her oncologist. Pathology results reported by Hezzie Bump, RN 10/30/2022. Electronically Signed   By: Harmon Pier M.D.   On: 11/04/2022 12:43   Result Date: 11/04/2022 CLINICAL DATA:  32 year old female with newly diagnosed RIGHT breast cancer. Patient presents for tissue sampling of 3 RIGHT breast masses and 1 LEFT breast mass (RIGHT breast mass identified on 10/07/2022 ultrasound -9:30 position mass, as well as 2 additional RIGHT breast masses and 1 LEFT breast mass identified on 2nd look ultrasound from MR findings. EXAM: ULTRASOUND GUIDED RIGHT BREAST CORE NEEDLE BIOPSY X 3 ULTRASOUND GUIDED LEFT BREAST CORE NEEDLE BIOPSY COMPARISON:  Previous exam(s). PROCEDURE: I met with the patient and we discussed the procedure of ultrasound-guided biopsy, including benefits and alternatives. We  discussed the high likelihood of a successful procedure. We discussed the risks of the procedure, including moderate chance of implant damage, infection, bleeding, tissue injury, clip migration, and inadequate sampling. Informed written consent was given. The usual time-out protocol was performed immediately prior  to the procedure. ULTRASOUND GUIDED RIGHT BREAST CORE NEEDLE BIOPSY #1 (0.9 cm mass at 9:30 position-RIBBON clip): Lesion quadrant: UPPER-OUTER RIGHT breast Using sterile technique and 1% Lidocaine with and without epinephrine as local anesthetic, under direct ultrasound visualization, a 14 gauge spring-loaded device was used to perform biopsy of the 0.9 cm mass at the 9:30 position of the RIGHT breast using a LATERAL approach. At the conclusion of the procedure a RIBBON shaped tissue marker clip was deployed into the biopsy cavity. Follow up 2 view mammogram was performed and dictated separately. ULTRASOUND GUIDED RIGHT BREAST CORE NEEDLE BIOPSY #2 (1.2 cm mass at the 11:30 position-VENUS clip): Lesion quadrant: UPPER-OUTER RIGHT breast Using sterile technique and 1% Lidocaine with and without epinephrine as local anesthetic, under direct ultrasound visualization, a 14 gauge spring-loaded device was used to perform biopsy of the 1.2 cm mass at the 11:30 position of the RIGHT breast using a LATERAL approach. At the conclusion of the procedure a VENUS shaped tissue marker clip was deployed into the biopsy cavity. Follow up 2 view mammogram was performed and dictated separately. ULTRASOUND GUIDED RIGHT BREAST CORE NEEDLE BIOPSY #3 (0.7 cm 3 o'clock position RETROAREOLAR mass-HEART clip): Using sterile technique and 1% Lidocaine with and without epinephrine as local anesthetic, under direct ultrasound visualization, a 14 gauge spring-loaded device was used to perform biopsy of the 0.7 cm mass at the 3 o'clock position of the RETROAREOLAR RIGHT breast using a SUPERIOR approach. At the conclusion of the  procedure a HEART shaped tissue marker clip was deployed into the biopsy cavity. Follow up 2 view mammogram was performed and dictated separately. ULTRASOUND GUIDED LEFT BREAST CORE NEEDLE BIOPSY (0.9 cm 9 o'clock position mass-RIBBON clip): Using sterile technique and 1% Lidocaine with and without epinephrine as local anesthetic, under direct ultrasound visualization, a 14 gauge spring-loaded device was used to perform biopsy of the 0.9 cm mass at the 9 o'clock position of the LEFT breast using a MEDIAL approach. At the conclusion of the procedure RIBBON shaped tissue marker clip was deployed into the biopsy cavity. Follow up 2 view mammogram was performed and dictated separately. IMPRESSION: Ultrasound guided biopsy of 0.9 cm RIGHT breast mass at the 9:30 position (RIBBON clip). Ultrasound-guided biopsy of 1.2 cm RIGHT breast mass at the 11:30 position (VENUS clip). Ultrasound-guided biopsy 0.7 cm INNER RETROAREOLAR RIGHT breast mass (HEART clip). Ultrasound guided biopsy of 0.9 cm INNER LEFT breast mass (RIBBON clip). No apparent complications. Electronically Signed: By: Harmon Pier M.D. On: 10/29/2022 13:29   [place] RT BREAST BX W LOC DEV EA ADD LESION IMG BX SPEC US GUIDE  Addendum Date: 11/04/2022   ADDENDUM REPORT: 11/04/2022 12:43 ADDENDUM: PATHOLOGY revealed: BREAST, RIGHT 9:30 5 CMFN; ULTRASOUND-GUIDED BIOPSY: BENIGN BREAST TISSUE WITH FIBROUS STROMA, SCLEROSING ADENOSIS, AND FOCAL STROMAL HEMORRHAGE. NEGATIVE FOR ATYPIA AND MALIGNANCY. Pathology results are CONCORDANT with imaging findings, per Dr. Harmon Pier. PATHOLOGY revealed: BREAST, RIGHT 11:30 5 CM FN; ULTRASOUND-GUIDED BIOPSY: FIBROADENOMA. BACKGROUND BENIGN BREAST TISSUE WITH FOCAL LACTATIONAL CHANGE. NEGATIVE FOR ATYPIA AND MALIGNANCY. Pathology results are CONCORDANT with imaging findings, per Dr. Harmon Pier. PATHOLOGY revealed: BREAST, RIGHT RETROAREOLAR 3:00; ULTRASOUND-GUIDED BIOPSY: BENIGN BREAST TISSUE WITH FIBROUS STROMA, SCLEROSING  ADENOSIS, AND FOCAL STROMAL HEMORRHAGE. NEGATIVE FOR ATYPIA AND MALIGNANCY. Pathology results are CONCORDANT with imaging findings, per Dr. Harmon Pier. PATHOLOGY revealed: BREAST, LEFT 9:00 4 CM FN; ULTRASOUND-GUIDED BIOPSY: FIBROADENOMA. NEGATIVE FOR ATYPIA AND MALIGNANCY. Pathology results are CONCORDANT with imaging findings, per Dr. Harmon Pier. Pathology results and recommendations below  were discussed with patient by telephone on 10/30/22. Patient reported biopsy site within normal limits with slight tenderness at the site. Post biopsy care instructions were reviewed, questions were answered and my direct phone number was provided to patient. Patient was instructed to call [facility] if any concerns or questions arise related to the biopsy. RECOMMENDATION: Patient instructed to continue monthly self breast examinations and to return to screenings at the recommendation of her oncologist. Pathology results reported by Hezzie Bump, RN 10/30/2022. Electronically Signed   By: Harmon Pier M.D.   On: 11/04/2022 12:43   Result Date: 11/04/2022 CLINICAL DATA:  32 year old female with newly diagnosed RIGHT breast cancer. Patient presents for tissue sampling of 3 RIGHT breast masses and 1 LEFT breast mass (RIGHT breast mass identified on 10/07/2022 ultrasound -9:30 position mass, as well as 2 additional RIGHT breast masses and 1 LEFT breast mass identified on 2nd look ultrasound from MR findings. EXAM: ULTRASOUND GUIDED RIGHT BREAST CORE NEEDLE BIOPSY X 3 ULTRASOUND GUIDED LEFT BREAST CORE NEEDLE BIOPSY COMPARISON:  Previous exam(s). PROCEDURE: I met with the patient and we discussed the procedure of ultrasound-guided biopsy, including benefits and alternatives. We discussed the high likelihood of a successful procedure. We discussed the risks of the procedure, including moderate chance of implant damage, infection, bleeding, tissue injury, clip migration, and inadequate sampling. Informed written  consent was given. The usual time-out protocol was performed immediately prior to the procedure. ULTRASOUND GUIDED RIGHT BREAST CORE NEEDLE BIOPSY #1 (0.9 cm mass at 9:30 position-RIBBON clip): Lesion quadrant: UPPER-OUTER RIGHT breast Using sterile technique and 1% Lidocaine with and without epinephrine as local anesthetic, under direct ultrasound visualization, a 14 gauge spring-loaded device was used to perform biopsy of the 0.9 cm mass at the 9:30 position of the RIGHT breast using a LATERAL approach. At the conclusion of the procedure a RIBBON shaped tissue marker clip was deployed into the biopsy cavity. Follow up 2 view mammogram was performed and dictated separately. ULTRASOUND GUIDED RIGHT BREAST CORE NEEDLE BIOPSY #2 (1.2 cm mass at the 11:30 position-VENUS clip): Lesion quadrant: UPPER-OUTER RIGHT breast Using sterile technique and 1% Lidocaine with and without epinephrine as local anesthetic, under direct ultrasound visualization, a 14 gauge spring-loaded device was used to perform biopsy of the 1.2 cm mass at the 11:30 position of the RIGHT breast using a LATERAL approach. At the conclusion of the procedure a VENUS shaped tissue marker clip was deployed into the biopsy cavity. Follow up 2 view mammogram was performed and dictated separately. ULTRASOUND GUIDED RIGHT BREAST CORE NEEDLE BIOPSY #3 (0.7 cm 3 o'clock position RETROAREOLAR mass-HEART clip): Using sterile technique and 1% Lidocaine with and without epinephrine as local anesthetic, under direct ultrasound visualization, a 14 gauge spring-loaded device was used to perform biopsy of the 0.7 cm mass at the 3 o'clock position of the RETROAREOLAR RIGHT breast using a SUPERIOR approach. At the conclusion of the procedure a HEART shaped tissue marker clip was deployed into the biopsy cavity. Follow up 2 view mammogram was performed and dictated separately. ULTRASOUND GUIDED LEFT BREAST CORE NEEDLE BIOPSY (0.9 cm 9 o'clock position mass-RIBBON clip):  Using sterile technique and 1% Lidocaine with and without epinephrine as local anesthetic, under direct ultrasound visualization, a 14 gauge spring-loaded device was used to perform biopsy of the 0.9 cm mass at the 9 o'clock position of the LEFT breast using a MEDIAL approach. At the conclusion of the procedure RIBBON shaped tissue marker clip was deployed into the biopsy cavity.  Follow up 2 view mammogram was performed and dictated separately. IMPRESSION: Ultrasound guided biopsy of 0.9 cm RIGHT breast mass at the 9:30 position (RIBBON clip). Ultrasound-guided biopsy of 1.2 cm RIGHT breast mass at the 11:30 position (VENUS clip). Ultrasound-guided biopsy 0.7 cm INNER RETROAREOLAR RIGHT breast mass (HEART clip). Ultrasound guided biopsy of 0.9 cm INNER LEFT breast mass (RIBBON clip). No apparent complications. Electronically Signed: By: Harmon Pier M.D. On: 10/29/2022 13:29   MM CLIP PLACEMENT RIGHT  Result Date: 10/29/2022 CLINICAL DATA:  Evaluate placement of biopsy clips following bilateral ultrasound-guided breast biopsies. EXAM: 3D DIAGNOSTIC BILATERAL MAMMOGRAM POST ULTRASOUND BIOPSY COMPARISON:  Previous exam(s). FINDINGS: 3D Mammographic images were obtained following ultrasound guided biopsy of a 0.9 cm 9:30 position RIGHT breast mass (RIBBON clip), a 1.2 cm 11:30 position RIGHT breast mass (VENUS clip), a 0.7 cm INNER RETROAREOLAR RIGHT breast mass (HEART clip), and a 0.9 cm INNER LEFT breast mass (RIBBON clip). The RIBBON biopsy marking clip is in expected position at the site of biopsy within the UPPER-OUTER RIGHT breast. The VENUS biopsy marking clip is in expected position at the site of biopsy within the UPPER-OUTER RIGHT breast. The HEART biopsy marking clip is in expected position at the site of biopsy within the RETROAREOLAR RIGHT breast The RIBBON biopsy marking clip is in expected position at the site of biopsy within the INNER LEFT breast IMPRESSION: Appropriate positioning of the RIBBON  shaped biopsy marking clip at the site of biopsy in the UPPER OUTER RIGHT breast (9:30 position RIGHT breast mass). Appropriate positioning of the VENUS biopsy marking clip at the site of biopsy within the UPPER-OUTER RIGHT breast (11:30 position RIGHT breast mass). Appropriate positioning of the HEART biopsy marking clip at the site of biopsy within the RETROAREOLAR RIGHT breast (RETROAREOLAR RIGHT breast mass). Appropriate positioning of the RIBBON shaped biopsy marking clip at the site of biopsy within the INNER LEFT breast. Final Assessment: Post Procedure Mammograms for Marker Placement Electronically Signed   By: Harmon Pier M.D.   On: 10/29/2022 13:34  MM CLIP PLACEMENT LEFT  Result Date: 10/29/2022 CLINICAL DATA:  Evaluate placement of biopsy clips following bilateral ultrasound-guided breast biopsies. EXAM: 3D DIAGNOSTIC BILATERAL MAMMOGRAM POST ULTRASOUND BIOPSY COMPARISON:  Previous exam(s). FINDINGS: 3D Mammographic images were obtained following ultrasound guided biopsy of a 0.9 cm 9:30 position RIGHT breast mass (RIBBON clip), a 1.2 cm 11:30 position RIGHT breast mass (VENUS clip), a 0.7 cm INNER RETROAREOLAR RIGHT breast mass (HEART clip), and a 0.9 cm INNER LEFT breast mass (RIBBON clip). The RIBBON biopsy marking clip is in expected position at the site of biopsy within the UPPER-OUTER RIGHT breast. The VENUS biopsy marking clip is in expected position at the site of biopsy within the UPPER-OUTER RIGHT breast. The HEART biopsy marking clip is in expected position at the site of biopsy within the RETROAREOLAR RIGHT breast The RIBBON biopsy marking clip is in expected position at the site of biopsy within the INNER LEFT breast IMPRESSION: Appropriate positioning of the RIBBON shaped biopsy marking clip at the site of biopsy in the UPPER OUTER RIGHT breast (9:30 position RIGHT breast mass). Appropriate positioning of the VENUS biopsy marking clip at the site of biopsy within the UPPER-OUTER RIGHT  breast (11:30 position RIGHT breast mass). Appropriate positioning of the HEART biopsy marking clip at the site of biopsy within the RETROAREOLAR RIGHT breast (RETROAREOLAR RIGHT breast mass). Appropriate positioning of the RIBBON shaped biopsy marking clip at the site of biopsy within the  INNER LEFT breast. Final Assessment: Post Procedure Mammograms for Marker Placement Electronically Signed   By: Harmon Pier M.D.   On: 10/29/2022 13:34  [place] LIMITED ULTRASOUND INCLUDING AXILLA LEFT BREAST   Result Date: 10/29/2022 CLINICAL DATA:  32 year old female with newly diagnosed RIGHT breast cancer. For 2nd-look ultrasound following recent MRI demonstrating additional UPPER RIGHT breast mass, INNER RIGHT breast mass, UPPER OUTER LEFT breast mass and INNER LEFT breast mass. Patient had a previous LEFT breast ultrasound on 09/06/2019 demonstrating a mass at the 2 o'clock position. EXAM: ULTRASOUND OF THE BILATERAL BREAST COMPARISON:  09/06/2019 ultrasound.  10/25/2022 MRI. FINDINGS: Targeted ultrasound is performed, showing the following: RIGHT breast: A 1.2 x 0.4 x 0.9 cm circumscribed oval hypoechoic mass at the 11:30 position 7 cm from the nipple corresponding to the UPPER RIGHT breast mass identified on MR. A 0.6 x 0.7 x 0.3 cm hypoechoic mass at the 3 o'clock position of the RETROAREOLAR RIGHT breast corresponding to the INNER RIGHT breast mass identified on MR. LEFT breast: A 1 x 0.4 x 0.9 cm mass at the 2 o'clock position 7 cm from the nipple, unchanged from 2021 and compatible with a benign mass and corresponding to the UPPER-OUTER LEFT breast mass identified on MR. A 0.9 x 0.4 x 0.8 cm circumscribed oval hypoechoic mass at the 9 o'clock position 4 cm from the nipple and corresponding to the INNER LEFT breast mass identified on MR. No abnormal axillary lymph nodes are noted. IMPRESSION: 1. Indeterminate 1.2 cm UPPER RIGHT breast mass, indeterminate 0.7 cm INNER RETROAREOLAR RIGHT breast mass, and indeterminate 0.9  cm INNER LEFT breast mass, corresponding to the recent MR findings. Tissue sampling of all 3 masses recommended. 2. Stable 1 cm UPPER-OUTER LEFT breast mass corresponding to the MR finding. Given this is unchanged from 09/06/2019, this is considered benign and no further follow-up recommended. RECOMMENDATION: Ultrasound-guided biopsies of indeterminate RIGHT breast masses and LEFT breast mass, which will be performed today but dictated in a separate report. I have discussed the findings and recommendations with the patient. If applicable, a reminder letter will be sent to the patient regarding the next appointment. BI-RADS CATEGORY  4: Suspicious. Electronically Signed   By: Harmon Pier M.D.   On: 10/29/2022 13:05  [place] LIMITED ULTRASOUND INCLUDING AXILLA RIGHT BREAST  Result Date: 10/29/2022 CLINICAL DATA:  32 year old female with newly diagnosed RIGHT breast cancer. For 2nd-look ultrasound following recent MRI demonstrating additional UPPER RIGHT breast mass, INNER RIGHT breast mass, UPPER OUTER LEFT breast mass and INNER LEFT breast mass. Patient had a previous LEFT breast ultrasound on 09/06/2019 demonstrating a mass at the 2 o'clock position. EXAM: ULTRASOUND OF THE BILATERAL BREAST COMPARISON:  09/06/2019 ultrasound.  10/25/2022 MRI. FINDINGS: Targeted ultrasound is performed, showing the following: RIGHT breast: A 1.2 x 0.4 x 0.9 cm circumscribed oval hypoechoic mass at the 11:30 position 7 cm from the nipple corresponding to the UPPER RIGHT breast mass identified on MR. A 0.6 x 0.7 x 0.3 cm hypoechoic mass at the 3 o'clock position of the RETROAREOLAR RIGHT breast corresponding to the INNER RIGHT breast mass identified on MR. LEFT breast: A 1 x 0.4 x 0.9 cm mass at the 2 o'clock position 7 cm from the nipple, unchanged from 2021 and compatible with a benign mass and corresponding to the UPPER-OUTER LEFT breast mass identified on MR. A 0.9 x 0.4 x 0.8 cm circumscribed oval hypoechoic mass at the 9 o'clock  position 4 cm from the nipple and corresponding to  the INNER LEFT breast mass identified on MR. No abnormal axillary lymph nodes are noted. IMPRESSION: 1. Indeterminate 1.2 cm UPPER RIGHT breast mass, indeterminate 0.7 cm INNER RETROAREOLAR RIGHT breast mass, and indeterminate 0.9 cm INNER LEFT breast mass, corresponding to the recent MR findings. Tissue sampling of all 3 masses recommended. 2. Stable 1 cm UPPER-OUTER LEFT breast mass corresponding to the MR finding. Given this is unchanged from 09/06/2019, this is considered benign and no further follow-up recommended. RECOMMENDATION: Ultrasound-guided biopsies of indeterminate RIGHT breast masses and LEFT breast mass, which will be performed today but dictated in a separate report. I have discussed the findings and recommendations with the patient. If applicable, a reminder letter will be sent to the patient regarding the next appointment. BI-RADS CATEGORY  4: Suspicious. Electronically Signed   By: Harmon Pier M.D.   On: 10/29/2022 13:05  NM Cardiac Muga Rest  Result Date: 10/28/2022 CLINICAL DATA:  RIGHT breast cancer, pre cardiotoxic chemotherapy EXAM: NUCLEAR MEDICINE CARDIAC BLOOD POOL IMAGING (MUGA) TECHNIQUE: Cardiac multi-gated acquisition was performed at rest following intravenous injection of Tc-17m labeled red blood cells. RADIOPHARMACEUTICALS:  21.94 mCi Tc-80m pertechnetate in-vitro labeled red blood cells IV COMPARISON:  None Available. FINDINGS: Calculated LEFT ventricular ejection fraction is 62.9%, normal. Study was obtained at a cardiac rate of 68 bpm. Patient was arrhythmic during imaging. Cine analysis of the LEFT ventricle in 3 projections demonstrates normal LV wall motion. IMPRESSION: Normal LEFT ventricular ejection fraction of 62.9% with normal LV wall motion. Electronically Signed   By: Ulyses Southward M.D.   On: 10/28/2022 13:25   MR Breast Bilateral W Wo Contrast  Result Date: 10/25/2022 CLINICAL DATA:  Patient with recent diagnosis  right breast cancer. EXAM: BILATERAL BREAST MRI WITH AND WITHOUT CONTRAST TECHNIQUE: Multiplanar, multisequence MR images of both breasts were obtained prior to and following the intravenous administration of 5 ml of Gadavist Three-dimensional MR images were rendered by post-processing of the original MR data on an independent workstation. The three-dimensional MR images were interpreted, and findings are reported in the following complete MRI report for this study. Three dimensional images were evaluated at the independent interpreting workstation using the DynaCAD thin client. COMPARISON:  Previous exam(s). FINDINGS: Breast composition: d. Extreme fibroglandular tissue. Background parenchymal enhancement: Mild Right breast: Within the 12 o'clock position right breast there is a 0.9 cm lobular enhancing mass. Within the 4 o'clock position left breast there is a 1.1 cm enhancing mass. Within the lower outer right breast there is a 1.7 x 1.0 cm enhancing mass compatible with biopsy-proven right breast carcinoma. Retropectoral implants. Left breast: Within the medial left breast there is a 1.1 cm oval enhancing mass. Within the upper-outer left breast there is a 0.8 cm oval enhancing mass. No additional concerning areas of enhancement identified within the left breast. Retropectoral implants. Lymph nodes: Two adjacent prominent right axillary lymph nodes measuring up to 5 mm in thickness. Ancillary findings:  Port-A-Cath. IMPRESSION: 1. Biopsy-proven right breast malignancy. 2. Within the right breast there are 2 additional enhancing masses. One is located in the 12 o'clock position and the other mass is located within the 4 o'clock position. 3. Two indeterminate masses within the left breast with one mass at the 9 o'clock position and other mass within the upper-outer left breast. 4. Two adjacent prominent right axillary lymph nodes. RECOMMENDATION: Recommend patient return for second-look ultrasound to evaluate MR  findings. Attempt to locate the 2 additional masses within the right breast at the 12 o'clock  position and 4 o'clock position. Attempt to locate the 2 additional masses within the left breast at the 9 o'clock position and upper-outer left breast. Reassess the right axilla for possible right axillary adenopathy. If any of these masses are not able to be assessed with ultrasound, MR biopsy of the mass would be recommended. BI-RADS CATEGORY  4: Suspicious. Electronically Signed   By: Annia Belt M.D.   On: 10/25/2022 11:21  DG Chest Port 1 View  Result Date: 10/24/2022 CLINICAL DATA:  [id number] S/P PICC central line placement [id number] EXAM: PORTABLE CHEST 1 VIEW COMPARISON:  None Available. FINDINGS: The cardiomediastinal silhouette is normal in contour.LEFT chest port with tip terminating over the superior cavoatrial junction. No pleural effusion. No pneumothorax. No acute pleuroparenchymal abnormality. IMPRESSION: LEFT chest port with tip terminating over the superior cavoatrial junction. Electronically Signed   By: Meda Klinefelter M.D.   On: 10/24/2022 09:22   DG C-Arm 1-60 Min-No Report  Result Date: 10/24/2022 Fluoroscopy was utilized by the requesting physician.  No radiographic interpretation.     Assessment and plan- Patient is a 32 y.o. female with breast cancer who returns to clinic for consideration of cycle 2 of chemotherapy:   Clinically prognostic stage Ib invasive mammary carcinoma of the right breast - cT1c CN0CM0 triple negative. Currently receiving neoadjuvant dose dense AC Chemotherapy with udenyca support. S/p cycle 1. Labs reviewed and acceptable for continuation of treatment. Proceed with cycle 2 of AC chemotherapy with udenyca support. Will plan to ultrasound after 4 cycles of AC chemotherapy. Plan to re-evaluate mass size at next visit.  Myalgias and arthralgias d/t GCSF- d/t udenyca. Continue claritin and tylenol as needed.  Chemotherapy induced nausea- well controlled with  antiemetics.  Hypokalemia- K 3.2 today. Start oral KDur 20 meq daily.  Anemia- d/t chemotherapy. Hemoglobin 11.5. Stable. Monitor.  Fertility- declines fertility conservation.  Referral to social work for financial assistance  Disposition:  Proceed with chemotherapy today. RTC for udenyca support on D4 RTC in 2 weeks for port/lab, Dr Smith Robert, +/- cycle 3 AC chemotherapy with Udenyca on D4- la  Visit Diagnosis 1. Malignant neoplasm of upper-outer quadrant of right breast in female, estrogen receptor negative (HCC)    Consuello Masse, DNP, AGNP-C, AOCNP [facility] [phone] (clinic) 11/22/2022

## 2022-11-22 NOTE — Progress Notes (Signed)
Lab in

## 2022-11-25 ENCOUNTER — Inpatient Hospital Stay: Payer: Medicaid Other | Attending: Oncology

## 2022-11-25 ENCOUNTER — Telehealth: Payer: Self-pay

## 2022-11-25 DIAGNOSIS — Z5111 Encounter for antineoplastic chemotherapy: Secondary | ICD-10-CM | POA: Diagnosis present

## 2022-11-25 DIAGNOSIS — Z79899 Other long term (current) drug therapy: Secondary | ICD-10-CM | POA: Diagnosis not present

## 2022-11-25 DIAGNOSIS — C50411 Malignant neoplasm of upper-outer quadrant of right female breast: Secondary | ICD-10-CM | POA: Diagnosis present

## 2022-11-25 MED ORDER — PEGFILGRASTIM-CBQV 6 MG/0.6ML ~~LOC~~ SOSY
6.0000 mg | PREFILLED_SYRINGE | Freq: Once | SUBCUTANEOUS | Status: AC
  Administered 2022-11-25: 6 mg via SUBCUTANEOUS
  Filled 2022-11-25: qty 0.6

## 2022-11-25 NOTE — Telephone Encounter (Signed)
CSW attempted to contact patient per referral from medical provider.  Left vm.

## 2022-11-27 ENCOUNTER — Inpatient Hospital Stay (HOSPITAL_BASED_OUTPATIENT_CLINIC_OR_DEPARTMENT_OTHER): Payer: Medicaid Other | Admitting: Hospice and Palliative Medicine

## 2022-11-27 DIAGNOSIS — C50411 Malignant neoplasm of upper-outer quadrant of right female breast: Secondary | ICD-10-CM

## 2022-11-27 DIAGNOSIS — Z171 Estrogen receptor negative status [ER-]: Secondary | ICD-10-CM

## 2022-11-27 NOTE — Progress Notes (Signed)
Multidisciplinary Oncology Council Documentation  KADYNN SCHANTZ was presented by our [facility] on 11/27/2022, which included representatives from:  Palliative Care Dietitian  Physical/Occupational Therapist Nurse Navigator Genetics Speech Therapist Social work Survivorship RN Financial Navigator Research RN   Jolea currently presents with history of breast cancer  We reviewed previous medical and familial history, history of present illness, and recent lab results along with all available histopathologic and imaging studies. The MOC considered available treatment options and made the following recommendations/referrals:  SW, nutrition  The MOC is a meeting of clinicians from various specialty areas who evaluate and discuss patients for whom a multidisciplinary approach is being considered. Final determinations in the plan of care are those of the provider(s).   Today's extended care, comprehensive team conference, Charlese was not present for the discussion and was not examined.

## 2022-11-28 ENCOUNTER — Telehealth: Payer: Self-pay

## 2022-11-28 NOTE — Telephone Encounter (Signed)
Left detailed message on voicemail-needs to bring current paystub, and bills to 12/06/2022, give to [place], [employer], to be sent to [facility].

## 2022-12-02 ENCOUNTER — Telehealth: Payer: Self-pay

## 2022-12-02 NOTE — Telephone Encounter (Signed)
CSW attempted to contact patient to assess psychosocial needs.  Left vm. 

## 2022-12-05 MED FILL — Fosaprepitant Dimeglumine For IV Infusion 150 MG (Base Eq): INTRAVENOUS | Qty: 5 | Status: AC

## 2022-12-05 MED FILL — Dexamethasone Sodium Phosphate Inj 100 MG/10ML: INTRAMUSCULAR | Qty: 1 | Status: AC

## 2022-12-06 ENCOUNTER — Inpatient Hospital Stay (HOSPITAL_BASED_OUTPATIENT_CLINIC_OR_DEPARTMENT_OTHER): Payer: Medicaid Other | Admitting: Oncology

## 2022-12-06 ENCOUNTER — Inpatient Hospital Stay: Payer: Medicaid Other

## 2022-12-06 ENCOUNTER — Encounter: Payer: Self-pay | Admitting: Oncology

## 2022-12-06 VITALS — BP 111/76 | HR 98 | Temp 97.9°F | Resp 18 | Ht 64.0 in | Wt 122.7 lb

## 2022-12-06 DIAGNOSIS — Z171 Estrogen receptor negative status [ER-]: Secondary | ICD-10-CM | POA: Diagnosis not present

## 2022-12-06 DIAGNOSIS — Z5111 Encounter for antineoplastic chemotherapy: Secondary | ICD-10-CM

## 2022-12-06 DIAGNOSIS — C50411 Malignant neoplasm of upper-outer quadrant of right female breast: Secondary | ICD-10-CM

## 2022-12-06 DIAGNOSIS — D729 Disorder of white blood cells, unspecified: Secondary | ICD-10-CM | POA: Diagnosis not present

## 2022-12-06 LAB — CBC WITH DIFFERENTIAL (CANCER CENTER ONLY)
Abs Immature Granulocytes: 6 10*3/uL — ABNORMAL HIGH (ref 0.00–0.07)
Basophils Absolute: 0.1 10*3/uL (ref 0.0–0.1)
Basophils Relative: 0 %
Eosinophils Absolute: 0 10*3/uL (ref 0.0–0.5)
Eosinophils Relative: 0 %
HCT: 35.2 % — ABNORMAL LOW (ref 36.0–46.0)
Hemoglobin: 11.7 g/dL — ABNORMAL LOW (ref 12.0–15.0)
Immature Granulocytes: 18 %
Lymphocytes Relative: 10 %
Lymphs Abs: 3.3 10*3/uL (ref 0.7–4.0)
MCH: 29.3 pg (ref 26.0–34.0)
MCHC: 33.2 g/dL (ref 30.0–36.0)
MCV: 88.2 fL (ref 80.0–100.0)
Monocytes Absolute: 1.8 10*3/uL — ABNORMAL HIGH (ref 0.1–1.0)
Monocytes Relative: 5 %
Neutro Abs: 23.2 10*3/uL — ABNORMAL HIGH (ref 1.7–7.7)
Neutrophils Relative %: 67 %
Platelet Count: 231 10*3/uL (ref 150–400)
RBC: 3.99 MIL/uL (ref 3.87–5.11)
RDW: 14.4 % (ref 11.5–15.5)
Smear Review: NORMAL
WBC Count: 34.4 10*3/uL — ABNORMAL HIGH (ref 4.0–10.5)
WBC Morphology: 10
nRBC: 0.2 % (ref 0.0–0.2)

## 2022-12-06 LAB — CMP (CANCER CENTER ONLY)
ALT: 20 U/L (ref 0–44)
AST: 21 U/L (ref 15–41)
Albumin: 4.1 g/dL (ref 3.5–5.0)
Alkaline Phosphatase: 104 U/L (ref 38–126)
Anion gap: 8 (ref 5–15)
BUN: 6 mg/dL (ref 6–20)
CO2: 23 mmol/L (ref 22–32)
Calcium: 8.8 mg/dL — ABNORMAL LOW (ref 8.9–10.3)
Chloride: 105 mmol/L (ref 98–111)
Creatinine: 0.64 mg/dL (ref 0.44–1.00)
GFR, Estimated: >60 mL/min (ref >60)
Glucose, Bld: 114 mg/dL — ABNORMAL HIGH (ref 70–99)
Potassium: 4.1 mmol/L (ref 3.5–5.1)
Sodium: 136 mmol/L (ref 135–145)
Total Bilirubin: 0.4 mg/dL (ref 0.3–1.2)
Total Protein: 7 g/dL (ref 6.5–8.1)

## 2022-12-06 LAB — PREGNANCY, URINE: Preg Test, Ur: NEGATIVE

## 2022-12-06 MED ORDER — PALONOSETRON HCL INJECTION 0.25 MG/5ML
0.2500 mg | Freq: Once | INTRAVENOUS | Status: AC
  Administered 2022-12-06: 0.25 mg via INTRAVENOUS
  Filled 2022-12-06: qty 5

## 2022-12-06 MED ORDER — SODIUM CHLORIDE 0.9 % IV SOLN
Freq: Once | INTRAVENOUS | Status: AC
  Filled 2022-12-06: qty 250

## 2022-12-06 MED ORDER — SODIUM CHLORIDE 0.9 % IV SOLN
10.0000 mg | Freq: Once | INTRAVENOUS | Status: AC
  Administered 2022-12-06: 10 mg via INTRAVENOUS
  Filled 2022-12-06: qty 10

## 2022-12-06 MED ORDER — SODIUM CHLORIDE 0.9 % IV SOLN
600.0000 mg/m2 | Freq: Once | INTRAVENOUS | Status: AC
  Administered 2022-12-06: 1000 mg via INTRAVENOUS
  Filled 2022-12-06: qty 50

## 2022-12-06 MED ORDER — SODIUM CHLORIDE 0.9% FLUSH
10.0000 mL | INTRAVENOUS | Status: DC | PRN
  Administered 2022-12-06: 10 mL
  Filled 2022-12-06: qty 10

## 2022-12-06 MED ORDER — SODIUM CHLORIDE 0.9 % IV SOLN
150.0000 mg | Freq: Once | INTRAVENOUS | Status: AC
  Administered 2022-12-06: 150 mg via INTRAVENOUS
  Filled 2022-12-06: qty 150

## 2022-12-06 MED ORDER — HEPARIN SOD (PORK) LOCK FLUSH 100 UNIT/ML IV SOLN
500.0000 [IU] | Freq: Once | INTRAVENOUS | Status: AC | PRN
  Administered 2022-12-06: 500 [IU]
  Filled 2022-12-06: qty 5

## 2022-12-06 MED ORDER — DOXORUBICIN HCL CHEMO IV INJECTION 2 MG/ML
60.0000 mg/m2 | Freq: Once | INTRAVENOUS | Status: AC
  Administered 2022-12-06: 94 mg via INTRAVENOUS
  Filled 2022-12-06: qty 47

## 2022-12-06 NOTE — Progress Notes (Signed)
Hematology/Oncology Consult note [facility]  [phone] [phone]  Patient Care Team: Glori Luis, MD as PCP - General (Family Medicine) Hulen Luster, RN as Oncology Nurse Navigator   Name of the patient: Eileen Hart  [id number]  [birth year]   Date of visit: 12/06/22  Diagnosis- clinical prognostic stage Ib invasive mammary carcinoma of the right breast cT1 cN0 M0 triple negative   Chief complaint/ Reason for visit-on treatment assessment prior to cycle 3 of neoadjuvant AC chemotherapy  Heme/Onc history: Patient is a 32 year old female who underwent a bilateral diagnostic mammogram on 10/07/2022 after she felt a palpable area of concern in her right breast. Mammogram showed a 1.6 x 1 x 1.5 cm solid mass in the right breast 9:30 position 5 cm from the nipple. There was also an adjacent 6 x 9 x 2 mm mass in the right breast. No right axillary adenopathy. The dominant mass was biopsied and was consistent with invasive mammary carcinoma grade 3 ER and HER2 negative.    Bilateral breast MRI showed additional areas of concern in the right breast.  2 additional enhancing masses 1 in the 12 o'clock position and 1 in the 4 o'clock position.  2 adjacent prominent right axillary lymph nodes.  2 intermittent masses in the left breast 9 o'clock position and upper outer left breast.  This was followed by a dedicated ultrasound.  She had 3 breast biopsies in the right side and 1 left breast biopsy and all of them were negative for malignancy.  On the ultrasound her right axillary lymph nodes appeared normal and therefore biopsy was not recommended for the same.   PET CT scan showed focal hypermetabolic activity in the right breast adjacent to the biopsy clip but no other additional areas of concern.  Baseline MUGA scan showed normal EF.  Given that additional biopsies were negative for malignancy and the only biopsy-proven site was 1.6 cm triple  negative breast cancer with negative lymph nodes plan was to do [facility] Taxol chemotherapy neoadjuvant without opting for keynote 522 regimen  Interval history-tolerating chemotherapy well so far.  She has nausea for about 2 to 3 days after chemotherapy which is well-controlled with nausea medications.  She reports ongoing fatigue but remains active.  ECOG PS- 0 Pain scale- 0   Review of systems- Review of Systems  Constitutional:  Negative for chills, fever, malaise/fatigue and weight loss.  HENT:  Negative for congestion, ear discharge and nosebleeds.   Eyes:  Negative for blurred vision.  Respiratory:  Negative for cough, hemoptysis, sputum production, shortness of breath and wheezing.   Cardiovascular:  Negative for chest pain, palpitations, orthopnea and claudication.  Gastrointestinal:  Negative for abdominal pain, blood in stool, constipation, diarrhea, heartburn, melena, nausea and vomiting.  Genitourinary:  Negative for dysuria, flank pain, frequency, hematuria and urgency.  Musculoskeletal:  Negative for back pain, joint pain and myalgias.  Skin:  Negative for rash.  Neurological:  Negative for dizziness, tingling, focal weakness, seizures, weakness and headaches.  Endo/Heme/Allergies:  Does not bruise/bleed easily.  Psychiatric/Behavioral:  Negative for depression and suicidal ideas. The patient does not have insomnia.       No Known Allergies   Past Medical History:  Diagnosis Date   Chlamydia 01/25/2010   Depression with anxiety    Gestational hypertension    Immunization, viral disease    gardasil series completed   Tobacco abuse 05/02/2016     Past Surgical History:  Procedure Laterality  Date   AUGMENTATION MAMMAPLASTY Bilateral    saline implants 2015   BREAST BIOPSY Right 10/14/2022   US biopsy/ coil clip/ path pending   BREAST BIOPSY Right 10/14/2022   [place] RT BREAST BX W LOC DEV 1ST LESION IMG BX SPEC US GUIDE 10/14/2022 [facility]   BREAST BIOPSY Left  10/29/2022   [place] Core Bx Ribbon clip- path pending   BREAST BIOPSY Right 10/29/2022   [place] Core 1130 Venus Clip- path pending   BREAST BIOPSY Right 10/29/2022   [place] Core Bx Retroareolar heart clip-path pending   BREAST BIOPSY Right 10/29/2022   [place] Core Bx Ribbon Clip path pending   BREAST BIOPSY Right 10/29/2022   [place] RT BREAST BX W LOC DEV EA ADD LESION IMG BX SPEC US GUIDE 10/29/2022 [facility]   BREAST BIOPSY Right 10/29/2022   [place] RT BREAST BX W LOC DEV 1ST LESION IMG BX SPEC US GUIDE 10/29/2022 [facility]   BREAST BIOPSY Right 10/29/2022   [place] RT BREAST BX W LOC DEV EA ADD LESION IMG BX SPEC US GUIDE 10/29/2022 [facility]   BREAST BIOPSY Left 10/29/2022   [place] LT BREAST BX W LOC DEV 1ST LESION IMG BX SPEC US GUIDE 10/29/2022 [facility]   BREAST ENHANCEMENT SURGERY  2015   CESAREAN SECTION  2014   PORTACATH PLACEMENT N/A 10/24/2022   Procedure: INSERTION PORT-A-CATH;  Surgeon: Leafy Ro, MD;  Location: [facility];  Service: General;  Laterality: N/A;   TONSILLECTOMY      Social History   Socioeconomic History   Marital status: Single    Spouse name: Not on file   Number of children: 1   Years of education: 15   Highest education level: Not on file  Occupational History   Occupation: Groomer    Comment: [employer]  Tobacco Use   Smoking status: Former    Packs/day: .5    Types: Cigarettes    Quit date: 07/25/2018    Years since quitting: 4.3   Smokeless tobacco: Never   Tobacco comments:    quit 07/2018  Vaping Use   Vaping Use: Never used  Substance and Sexual Activity   Alcohol use: No    Alcohol/week: 0.0 standard drinks of alcohol   Drug use: No   Sexual activity: Yes    Birth control/protection: Pill  Other Topics Concern   Not on file  Social History Narrative   Not on file   Social Determinants of Health   Financial Resource Strain: Not on file  Food Insecurity: No Food Insecurity (10/22/2022)   Hunger Vital Sign    Worried About Running Out  of Food in the Last Year: Never true    Ran Out of Food in the Last Year: Never true  Transportation Needs: No Transportation Needs (10/22/2022)   PRAPARE - Administrator, Civil Service (Medical): No    Lack of Transportation (Non-Medical): No  Physical Activity: Not on file  Stress: Not on file  Social Connections: Not on file  Intimate Partner Violence: Not At Risk (10/22/2022)   Humiliation, Afraid, Rape, and Kick questionnaire    Fear of Current or Ex-Partner: No    Emotionally Abused: No    Physically Abused: No    Sexually Abused: No    Family History  Problem Relation Age of Onset   Breast cancer Other 35       mat 2nd cousin   Diabetes Neg Hx    Heart disease Neg Hx  Hypertension Neg Hx    Ovarian cancer Neg Hx    Colon cancer Neg Hx      Current Outpatient Medications:    dexamethasone (DECADRON) 4 MG tablet, Take 2 tablets (8 mg total) by mouth daily for 3 days. Start the day after doxorubicin/cyclophosphamide chemotherapy. Take with food., Disp: 30 tablet, Rfl: 1   escitalopram (LEXAPRO) 10 MG tablet, TAKE 1 TABLET BY MOUTH EVERY DAY, Disp: 90 tablet, Rfl: 2   HYDROcodone-acetaminophen (NORCO/VICODIN) 5-325 MG tablet, Take 1-2 tablets by mouth every 6 (six) hours as needed for moderate pain., Disp: 15 tablet, Rfl: 0   lidocaine-prilocaine (EMLA) cream, Apply to affected area once, Disp: 30 g, Rfl: 3   norgestimate-ethinyl estradiol (ORTHO-CYCLEN) 0.25-35 MG-MCG tablet, Take 1 tablet by mouth daily., Disp: 84 tablet, Rfl: 3   ondansetron (ZOFRAN) 8 MG tablet, Take 1 tab (8 mg) by mouth every 8 hrs as needed for nausea/vomiting. Start third day after doxorubicin/cyclophosphamide chemotherapy., Disp: 30 tablet, Rfl: 1   potassium chloride SA (KLOR-CON M) 20 MEQ tablet, Take 1 tablet (20 mEq total) by mouth daily., Disp: 30 tablet, Rfl: 0   prochlorperazine (COMPAZINE) 10 MG tablet, Take 1 tablet (10 mg total) by mouth every 6 (six) hours as needed for nausea  or vomiting., Disp: 30 tablet, Rfl: 1 No current facility-administered medications for this visit.  Facility-Administered Medications Ordered in Other Visits:    sodium chloride flush (NS) 0.9 % injection 10 mL, 10 mL, Intracatheter, PRN, Alinda Dooms, NP, 10 mL at 12/06/22 1156  Physical exam:  Vitals:   12/06/22 0903  BP: 111/76  Pulse: 98  Resp: 18  Temp: 97.9 F (36.6 C)  TempSrc: Tympanic  SpO2: 99%  Weight: 122 lb 11.2 oz (55.7 kg)  Height: 5\' 4"  (1.626 m)   Physical Exam Cardiovascular:     Rate and Rhythm: Normal rate and regular rhythm.     Heart sounds: Normal heart sounds.  Pulmonary:     Effort: Pulmonary effort is normal.     Breath sounds: Normal breath sounds.  Abdominal:     General: Bowel sounds are normal.     Palpations: Abdomen is soft.  Skin:    General: Skin is warm and dry.  Neurological:     Mental Status: She is alert and oriented to person, place, and time.   Breast exam: Right breast mass at 9:30 position is palpable and measures roughly about 1 cm     Latest Ref Rng & Units 12/06/2022    8:59 AM  CMP  Glucose 70 - 99 mg/dL 161   BUN 6 - 20 mg/dL 6   Creatinine 0.96 - 0.45 mg/dL 4.09   Sodium 811 - 914 mmol/L 136   Potassium 3.5 - 5.1 mmol/L 4.1   Chloride 98 - 111 mmol/L 105   CO2 22 - 32 mmol/L 23   Calcium 8.9 - 10.3 mg/dL 8.8   Total Protein 6.5 - 8.1 g/dL 7.0   Total Bilirubin 0.3 - 1.2 mg/dL 0.4   Alkaline Phos 38 - 126 U/L 104   AST 15 - 41 U/L 21   ALT 0 - 44 U/L 20       Latest Ref Rng & Units 12/06/2022    8:59 AM  CBC  WBC 4.0 - 10.5 K/uL 34.4   Hemoglobin 12.0 - 15.0 g/dL 78.2   Hematocrit 95.6 - 46.0 % 35.2   Platelets 150 - 400 K/uL 231     No images are  attached to the encounter.  NM PET Image Initial (PI) Skull Base To Thigh  Result Date: 11/06/2022 CLINICAL DATA:  Initial treatment strategy for newly diagnosed right breast cancer. EXAM: NUCLEAR MEDICINE PET SKULL BASE TO THIGH TECHNIQUE: 6.73 mCi F-18 FDG  was injected intravenously. Full-ring PET imaging was performed from the skull base to thigh after the radiotracer. CT data was obtained and used for attenuation correction and anatomic localization. Fasting blood glucose: 82 mg/dl COMPARISON:  No prior relevant cross-sectional imaging. Recent right breast imaging is correlated. FINDINGS: Mediastinal blood pool activity: SUV max 1.6 NECK: No hypermetabolic cervical lymph nodes are identified.Fairly symmetric activity within the lymphoid tissue of Waldeyer's ring is within physiologic limits.No suspicious activity identified within the pharyngeal mucosal space. Incidental CT findings: none CHEST: There are no hypermetabolic mediastinal, hilar, axillary or internal mammary lymph nodes. There is focal hypermetabolic activity laterally in the right breast adjacent to a biopsy clip, demonstrating an SUV max of 4.3. No other hypermetabolic activity is seen within the breasts or chest wall. No hypermetabolic pulmonary activity or suspicious nodularity. Incidental CT findings: Left IJ Port-A-Cath extends to the superior cavoatrial junction. Small amount of residual thymic tissue in the anterior mediastinum. There are bilateral subpectoral breast implants. ABDOMEN/PELVIS: There is no hypermetabolic activity within the liver, adrenal glands, spleen or pancreas. There is no hypermetabolic nodal activity in the abdomen or pelvis. There is tubular activity in the pelvis bilaterally. On the right, this is posteriorly located and may relate to the right ureter or adnexa. On the left, this is anteriorly located and appears adnexal within SUV max of 16.3. Incidental CT findings: Possible punctate renal calculi bilaterally. No hydronephrosis, ascites or peritoneal nodularity. SKELETON: There is no hypermetabolic activity to suggest osseous metastatic disease. Incidental CT findings: none IMPRESSION: 1. Focal hypermetabolic activity laterally in the right breast consistent with known  breast cancer. 2. No evidence of metastatic disease. 3. Indeterminate tubular activity in the pelvis bilaterally, likely physiologic. Consider further evaluation with pelvic ultrasound, as clinically warranted. 4. Possible punctate renal calculi bilaterally. Electronically Signed   By: Carey Bullocks M.D.   On: 11/06/2022 16:39     Assessment and plan- Patient is a 32 y.o. 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  She is here for on treatment assessment prior to cycle 3 of neoadjuvant dose dense AC chemotherapy  White cell count is 34 today as compared to 18 2 weeks ago likely secondary to Neulasta.  Clinically no signs and symptoms of infection.  I will hold off on giving her Neulasta with this cycle.  She may need it with cycle 4.  She will be seen by covering NP in 2 weeks for cycle 4 of neoadjuvant dose dense AC chemotherapy.  I will plan to get interim ultrasound of her right breast to assess response to treatment.  If there is a significant response to treatment I will consider adding carboplatin to weekly Taxol when I see her back in 4 weeks   Visit Diagnosis 1. Malignant neoplasm of upper-outer quadrant of right breast in female, estrogen receptor negative (HCC)   2. Encounter for antineoplastic chemotherapy   3. Neutrophilia      Dr. Owens Shark, MD, MPH [facility] [phone] 12/06/2022 1:00 PM

## 2022-12-06 NOTE — Patient Instructions (Signed)
[facility]  Discharge Instructions: Thank you for choosing [facility] to provide your oncology and hematology care.  If you have a lab appointment with the Cancer Center, please go directly to the Cancer Center and check in at the registration area.  Wear comfortable clothing and clothing appropriate for easy access to any Portacath or PICC line.   We strive to give you quality time with your provider. You may need to reschedule your appointment if you arrive late (15 or more minutes).  Arriving late affects you and other patients whose appointments are after yours.  Also, if you miss three or more appointments without notifying the office, you may be dismissed from the clinic at the provider's discretion.      For prescription refill requests, have your pharmacy contact our office and allow 72 hours for refills to be completed.    Today you received the following chemotherapy and/or immunotherapy agents- doxorubicin, cytoxin      To help prevent nausea and vomiting after your treatment, we encourage you to take your nausea medication as directed.  BELOW ARE SYMPTOMS THAT SHOULD BE REPORTED IMMEDIATELY: *FEVER GREATER THAN 100.4 F (38 C) OR HIGHER *CHILLS OR SWEATING *NAUSEA AND VOMITING THAT IS NOT CONTROLLED WITH YOUR NAUSEA MEDICATION *UNUSUAL SHORTNESS OF BREATH *UNUSUAL BRUISING OR BLEEDING *URINARY PROBLEMS (pain or burning when urinating, or frequent urination) *BOWEL PROBLEMS (unusual diarrhea, constipation, pain near the anus) TENDERNESS IN MOUTH AND THROAT WITH OR WITHOUT PRESENCE OF ULCERS (sore throat, sores in mouth, or a toothache) UNUSUAL RASH, SWELLING OR PAIN  UNUSUAL VAGINAL DISCHARGE OR ITCHING   Items with * indicate a potential emergency and should be followed up as soon as possible or go to the Emergency Department if any problems should occur.  Please show the CHEMOTHERAPY ALERT CARD or IMMUNOTHERAPY ALERT CARD at  check-in to the Emergency Department and triage nurse.  Should you have questions after your visit or need to cancel or reschedule your appointment, please contact [facility] [facility]  [phone] and follow the prompts.  Office hours are 8:00 a.m. to 4:30 p.m. Monday - Friday. Please note that voicemails left after 4:00 p.m. may not be returned until the following business day.  We are closed weekends and major holidays. You have access to a nurse at all times for urgent questions. Please call the main number to the clinic [phone] and follow the prompts.  For any non-urgent questions, you may also contact your provider using MyChart. We now offer e-Visits for anyone 30 and older to request care online for non-urgent symptoms. For details visit [email or web address].   Also download the MyChart app! Go to the app store, search "MyChart", open the app, select [place], and log in with your MyChart username and password.

## 2022-12-09 ENCOUNTER — Encounter: Payer: Self-pay | Admitting: *Deleted

## 2022-12-09 ENCOUNTER — Inpatient Hospital Stay: Payer: Medicaid Other

## 2022-12-09 NOTE — Progress Notes (Signed)
Spoke to Eileen Hart.  She is doing well, no concerns at this time.   She will email her paystubs and medical bills to initiate her medicaid application.

## 2022-12-14 ENCOUNTER — Other Ambulatory Visit: Payer: Self-pay | Admitting: Nurse Practitioner

## 2022-12-16 ENCOUNTER — Encounter: Payer: Self-pay | Admitting: Oncology

## 2022-12-19 MED FILL — Fosaprepitant Dimeglumine For IV Infusion 150 MG (Base Eq): INTRAVENOUS | Qty: 5 | Status: AC

## 2022-12-19 MED FILL — Dexamethasone Sodium Phosphate Inj 100 MG/10ML: INTRAMUSCULAR | Qty: 1 | Status: AC

## 2022-12-20 ENCOUNTER — Inpatient Hospital Stay: Payer: Medicaid Other

## 2022-12-20 ENCOUNTER — Encounter: Payer: Self-pay | Admitting: Nurse Practitioner

## 2022-12-20 ENCOUNTER — Inpatient Hospital Stay (HOSPITAL_BASED_OUTPATIENT_CLINIC_OR_DEPARTMENT_OTHER): Payer: Medicaid Other | Admitting: Nurse Practitioner

## 2022-12-20 VITALS — BP 105/70 | HR 95 | Temp 98.9°F | Wt 122.0 lb

## 2022-12-20 DIAGNOSIS — C50411 Malignant neoplasm of upper-outer quadrant of right female breast: Secondary | ICD-10-CM

## 2022-12-20 DIAGNOSIS — T451X5A Adverse effect of antineoplastic and immunosuppressive drugs, initial encounter: Secondary | ICD-10-CM | POA: Diagnosis not present

## 2022-12-20 DIAGNOSIS — Z171 Estrogen receptor negative status [ER-]: Secondary | ICD-10-CM | POA: Diagnosis not present

## 2022-12-20 DIAGNOSIS — D701 Agranulocytosis secondary to cancer chemotherapy: Secondary | ICD-10-CM

## 2022-12-20 DIAGNOSIS — Z5111 Encounter for antineoplastic chemotherapy: Secondary | ICD-10-CM

## 2022-12-20 LAB — CBC WITH DIFFERENTIAL (CANCER CENTER ONLY)
Abs Immature Granulocytes: 0 10*3/uL (ref 0.00–0.07)
Basophils Absolute: 0 10*3/uL (ref 0.0–0.1)
Basophils Relative: 2 %
Eosinophils Absolute: 0 10*3/uL (ref 0.0–0.5)
Eosinophils Relative: 2 %
HCT: 29.2 % — ABNORMAL LOW (ref 36.0–46.0)
Hemoglobin: 10 g/dL — ABNORMAL LOW (ref 12.0–15.0)
Immature Granulocytes: 0 %
Lymphocytes Relative: 40 %
Lymphs Abs: 0.9 10*3/uL (ref 0.7–4.0)
MCH: 28.7 pg (ref 26.0–34.0)
MCHC: 34.2 g/dL (ref 30.0–36.0)
MCV: 83.9 fL (ref 80.0–100.0)
Monocytes Absolute: 0.3 10*3/uL (ref 0.1–1.0)
Monocytes Relative: 16 %
Neutro Abs: 0.9 10*3/uL — ABNORMAL LOW (ref 1.7–7.7)
Neutrophils Relative %: 40 %
Platelet Count: 205 10*3/uL (ref 150–400)
RBC: 3.48 MIL/uL — ABNORMAL LOW (ref 3.87–5.11)
RDW: 15.1 % (ref 11.5–15.5)
WBC Count: 2.1 10*3/uL — ABNORMAL LOW (ref 4.0–10.5)
nRBC: 0 % (ref 0.0–0.2)

## 2022-12-20 LAB — CMP (CANCER CENTER ONLY)
ALT: 10 U/L (ref 0–44)
AST: 18 U/L (ref 15–41)
Albumin: 4.1 g/dL (ref 3.5–5.0)
Alkaline Phosphatase: 47 U/L (ref 38–126)
Anion gap: 9 (ref 5–15)
BUN: 13 mg/dL (ref 6–20)
CO2: 23 mmol/L (ref 22–32)
Calcium: 8.8 mg/dL — ABNORMAL LOW (ref 8.9–10.3)
Chloride: 106 mmol/L (ref 98–111)
Creatinine: 0.54 mg/dL (ref 0.44–1.00)
GFR, Estimated: >60 mL/min (ref >60)
Glucose, Bld: 112 mg/dL — ABNORMAL HIGH (ref 70–99)
Potassium: 3.4 mmol/L — ABNORMAL LOW (ref 3.5–5.1)
Sodium: 138 mmol/L (ref 135–145)
Total Bilirubin: 0.4 mg/dL (ref 0.3–1.2)
Total Protein: 6.7 g/dL (ref 6.5–8.1)

## 2022-12-20 MED ORDER — SODIUM CHLORIDE 0.9% FLUSH
10.0000 mL | Freq: Once | INTRAVENOUS | Status: AC
  Administered 2022-12-20: 10 mL via INTRAVENOUS
  Filled 2022-12-20: qty 10

## 2022-12-20 NOTE — Progress Notes (Signed)
Hematology/Oncology Consult Note [facility]  [phone] [phone]  Patient Care Team: Glori Luis, MD as PCP - General (Family Medicine) Hulen Luster, RN as Oncology Nurse Navigator   Name of the patient: Eileen Hart  [id number]  02-20-1991   Date of visit: 12/20/22  Diagnosis- clinical prognostic stage Ib invasive mammary carcinoma of the right breast cT1 cN0 M0 triple negative   Chief complaint/ Reason for visit-on treatment assessment prior to cycle 4 of neoadjuvant AC chemotherapy  Heme/Onc history: Patient is a 32 year old female who underwent a bilateral diagnostic mammogram on 10/07/2022 after she felt a palpable area of concern in her right breast. Mammogram showed a 1.6 x 1 x 1.5 cm solid mass in the right breast 9:30 position 5 cm from the nipple. There was also an adjacent 6 x 9 x 2 mm mass in the right breast. No right axillary adenopathy. The dominant mass was biopsied and was consistent with invasive mammary carcinoma grade 3 ER and HER2 negative.    Bilateral breast MRI showed additional areas of concern in the right breast.  2 additional enhancing masses 1 in the 12 o'clock position and 1 in the 4 o'clock position.  2 adjacent prominent right axillary lymph nodes.  2 intermittent masses in the left breast 9 o'clock position and upper outer left breast.  This was followed by a dedicated ultrasound.  She had 3 breast biopsies in the right side and 1 left breast biopsy and all of them were negative for malignancy.  On the ultrasound her right axillary lymph nodes appeared normal and therefore biopsy was not recommended for the same.   PET CT scan showed focal hypermetabolic activity in the right breast adjacent to the biopsy clip but no other additional areas of concern.  Baseline MUGA scan showed normal EF.  Given that additional biopsies were negative for malignancy and the only biopsy-proven site was 1.6 cm triple negative  breast cancer with negative lymph nodes plan was to do [facility] Taxol chemotherapy neoadjuvant without opting for keynote 522 regimen  Interval history- Continues to tolerate chemotherapy well. She has nausea for 2-3 days after treatment but well controlled with prescribed nausea medications. Has fatigue but able to do ADLs.   ECOG PS- 0 Pain scale- 0  Review of systems- Review of Systems  Constitutional:  Negative for chills, fever, malaise/fatigue and weight loss.  HENT:  Negative for congestion, ear discharge and nosebleeds.   Eyes:  Negative for blurred vision.  Respiratory:  Negative for cough, hemoptysis, sputum production, shortness of breath and wheezing.   Cardiovascular:  Negative for chest pain, palpitations, orthopnea and claudication.  Gastrointestinal:  Negative for abdominal pain, blood in stool, constipation, diarrhea, heartburn, melena, nausea and vomiting.  Genitourinary:  Negative for dysuria, flank pain, frequency, hematuria and urgency.  Musculoskeletal:  Negative for back pain, joint pain and myalgias.  Skin:  Negative for rash.  Neurological:  Negative for dizziness, tingling, focal weakness, seizures, weakness and headaches.  Endo/Heme/Allergies:  Does not bruise/bleed easily.  Psychiatric/Behavioral:  Negative for depression and suicidal ideas. The patient does not have insomnia.     No Known Allergies  Past Medical History:  Diagnosis Date   Chlamydia 01/25/2010   Depression with anxiety    Gestational hypertension    Immunization, viral disease    gardasil series completed   Tobacco abuse 05/02/2016   Past Surgical History:  Procedure Laterality Date   AUGMENTATION MAMMAPLASTY Bilateral    saline  implants 2015   BREAST BIOPSY Right 10/14/2022   US biopsy/ coil clip/ path pending   BREAST BIOPSY Right 10/14/2022   [place] RT BREAST BX W LOC DEV 1ST LESION IMG BX SPEC US GUIDE 10/14/2022 [facility]   BREAST BIOPSY Left 10/29/2022   [place] Core Bx Ribbon clip-  path pending   BREAST BIOPSY Right 10/29/2022   [place] Core 1130 Venus Clip- path pending   BREAST BIOPSY Right 10/29/2022   [place] Core Bx Retroareolar heart clip-path pending   BREAST BIOPSY Right 10/29/2022   [place] Core Bx Ribbon Clip path pending   BREAST BIOPSY Right 10/29/2022   [place] RT BREAST BX W LOC DEV EA ADD LESION IMG BX SPEC US GUIDE 10/29/2022 [facility]   BREAST BIOPSY Right 10/29/2022   [place] RT BREAST BX W LOC DEV 1ST LESION IMG BX SPEC US GUIDE 10/29/2022 [facility]   BREAST BIOPSY Right 10/29/2022   [place] RT BREAST BX W LOC DEV EA ADD LESION IMG BX SPEC US GUIDE 10/29/2022 [facility]   BREAST BIOPSY Left 10/29/2022   [place] LT BREAST BX W LOC DEV 1ST LESION IMG BX SPEC US GUIDE 10/29/2022 [facility]   BREAST ENHANCEMENT SURGERY  2015   CESAREAN SECTION  2014   PORTACATH PLACEMENT N/A 10/24/2022   Procedure: INSERTION PORT-A-CATH;  Surgeon: Leafy Ro, MD;  Location: [facility];  Service: General;  Laterality: N/A;   TONSILLECTOMY     Social History   Socioeconomic History   Marital status: Single    Spouse name: Not on file   Number of children: 1   Years of education: 15   Highest education level: Not on file  Occupational History   Occupation: Groomer    Comment: [employer]  Tobacco Use   Smoking status: Former    Packs/day: .5    Types: Cigarettes    Quit date: 07/25/2018    Years since quitting: 4.4   Smokeless tobacco: Never   Tobacco comments:    quit 07/2018  Vaping Use   Vaping Use: Never used  Substance and Sexual Activity   Alcohol use: No    Alcohol/week: 0.0 standard drinks of alcohol   Drug use: No   Sexual activity: Yes    Birth control/protection: Pill  Other Topics Concern   Not on file  Social History Narrative   Not on file   Social Determinants of Health   Financial Resource Strain: Not on file  Food Insecurity: No Food Insecurity (10/22/2022)   Hunger Vital Sign    Worried About Running Out of Food in the Last Year: Never true     Ran Out of Food in the Last Year: Never true  Transportation Needs: No Transportation Needs (10/22/2022)   PRAPARE - Administrator, Civil Service (Medical): No    Lack of Transportation (Non-Medical): No  Physical Activity: Not on file  Stress: Not on file  Social Connections: Not on file  Intimate Partner Violence: Not At Risk (10/22/2022)   Humiliation, Afraid, Rape, and Kick questionnaire    Fear of Current or Ex-Partner: No    Emotionally Abused: No    Physically Abused: No    Sexually Abused: No   Family History  Problem Relation Age of Onset   Breast cancer Other 35       mat 2nd cousin   Diabetes Neg Hx    Heart disease Neg Hx    Hypertension Neg Hx    Ovarian cancer Neg Hx  Colon cancer Neg Hx     Current Outpatient Medications:    dexamethasone (DECADRON) 4 MG tablet, Take 2 tablets (8 mg total) by mouth daily for 3 days. Start the day after doxorubicin/cyclophosphamide chemotherapy. Take with food., Disp: 30 tablet, Rfl: 1   escitalopram (LEXAPRO) 10 MG tablet, TAKE 1 TABLET BY MOUTH EVERY DAY, Disp: 90 tablet, Rfl: 2   HYDROcodone-acetaminophen (NORCO/VICODIN) 5-325 MG tablet, Take 1-2 tablets by mouth every 6 (six) hours as needed for moderate pain., Disp: 15 tablet, Rfl: 0   lidocaine-prilocaine (EMLA) cream, Apply to affected area once, Disp: 30 g, Rfl: 3   norgestimate-ethinyl estradiol (ORTHO-CYCLEN) 0.25-35 MG-MCG tablet, Take 1 tablet by mouth daily., Disp: 84 tablet, Rfl: 3   ondansetron (ZOFRAN) 8 MG tablet, Take 1 tab (8 mg) by mouth every 8 hrs as needed for nausea/vomiting. Start third day after doxorubicin/cyclophosphamide chemotherapy., Disp: 30 tablet, Rfl: 1   potassium chloride SA (KLOR-CON M) 20 MEQ tablet, Take 1 tablet (20 mEq total) by mouth daily., Disp: 30 tablet, Rfl: 0   prochlorperazine (COMPAZINE) 10 MG tablet, Take 1 tablet (10 mg total) by mouth every 6 (six) hours as needed for nausea or vomiting., Disp: 30 tablet, Rfl:  1  Physical exam:  Vitals:   12/20/22 0859  BP: 105/70  Pulse: 95  Temp: 98.9 F (37.2 C)  TempSrc: Tympanic  SpO2: 100%  Weight: 122 lb (55.3 kg)   Physical Exam Vitals reviewed.  Constitutional:      Comments: Accompanied by mother  Cardiovascular:     Rate and Rhythm: Normal rate and regular rhythm.  Pulmonary:     Effort: Pulmonary effort is normal. No respiratory distress.  Abdominal:     General: There is no distension.     Palpations: Abdomen is soft.     Tenderness: There is no abdominal tenderness.  Skin:    General: Skin is warm and dry.  Neurological:     Mental Status: She is alert and oriented to person, place, and time.  Psychiatric:        Mood and Affect: Mood normal.        Behavior: Behavior normal.       Latest Ref Rng & Units 12/20/2022    8:44 AM  CMP  Glucose 70 - 99 mg/dL 010   BUN 6 - 20 mg/dL 13   Creatinine 2.72 - 1.00 mg/dL 5.36   Sodium 644 - 034 mmol/L 138   Potassium 3.5 - 5.1 mmol/L 3.4   Chloride 98 - 111 mmol/L 106   CO2 22 - 32 mmol/L 23   Calcium 8.9 - 10.3 mg/dL 8.8   Total Protein 6.5 - 8.1 g/dL 6.7   Total Bilirubin 0.3 - 1.2 mg/dL 0.4   Alkaline Phos 38 - 126 U/L 47   AST 15 - 41 U/L 18   ALT 0 - 44 U/L 10       Latest Ref Rng & Units 12/20/2022    8:44 AM  CBC  WBC 4.0 - 10.5 K/uL 2.1   Hemoglobin 12.0 - 15.0 g/dL 74.2   Hematocrit 59.5 - 46.0 % 29.2   Platelets 150 - 400 K/uL 205     No images are attached to the encounter.  No results found.   Assessment and plan- Patient is a 32 y.o. female   clinically prognostic stage Ib invasive mammary carcinoma of the right breast cT1c CN0CM0 triple negative.  She is here for on treatment assessment prior to cycle  4 of neoadjuvant dose dense AC chemotherapy. She did not receive neulasta with cycle 3 due to leukocytosis. ANC today however is 0.9. Plan to hold chemotherapy today and reconsider treatment x 1 week.   Disposition:  Hold tx today Deaccess port Reschedule  nutrition appt to another day when she is here for chemo Rtc 1 week- port/lab, AC chemo D4 (Monday)- Neulasta- la    Visit Diagnosis 1. Malignant neoplasm of upper-outer quadrant of right breast in female, estrogen receptor negative (HCC)   2. Encounter for antineoplastic chemotherapy   3. Chemotherapy induced neutropenia (HCC)    Consuello Masse, DNP, AGNP-C, [facility] at [facility] [phone] (clinic) 12/20/2022

## 2022-12-23 ENCOUNTER — Inpatient Hospital Stay: Payer: Medicaid Other

## 2022-12-25 ENCOUNTER — Encounter: Payer: Self-pay | Admitting: Oncology

## 2022-12-25 MED FILL — Dexamethasone Sodium Phosphate Inj 100 MG/10ML: INTRAMUSCULAR | Qty: 1 | Status: AC

## 2022-12-25 MED FILL — Fosaprepitant Dimeglumine For IV Infusion 150 MG (Base Eq): INTRAVENOUS | Qty: 5 | Status: AC

## 2022-12-27 ENCOUNTER — Inpatient Hospital Stay: Payer: Medicaid Other

## 2022-12-27 ENCOUNTER — Encounter: Payer: Self-pay | Admitting: Oncology

## 2022-12-27 ENCOUNTER — Inpatient Hospital Stay (HOSPITAL_BASED_OUTPATIENT_CLINIC_OR_DEPARTMENT_OTHER): Payer: Medicaid Other | Admitting: Nurse Practitioner

## 2022-12-27 ENCOUNTER — Inpatient Hospital Stay: Payer: Medicaid Other | Attending: Oncology

## 2022-12-27 ENCOUNTER — Encounter: Payer: Self-pay | Admitting: Nurse Practitioner

## 2022-12-27 VITALS — BP 104/67 | HR 86 | Temp 97.0°F | Wt 120.0 lb

## 2022-12-27 DIAGNOSIS — Z5189 Encounter for other specified aftercare: Secondary | ICD-10-CM | POA: Insufficient documentation

## 2022-12-27 DIAGNOSIS — Z171 Estrogen receptor negative status [ER-]: Secondary | ICD-10-CM

## 2022-12-27 DIAGNOSIS — Z170 Estrogen receptor positive status [ER+]: Secondary | ICD-10-CM | POA: Insufficient documentation

## 2022-12-27 DIAGNOSIS — D701 Agranulocytosis secondary to cancer chemotherapy: Secondary | ICD-10-CM | POA: Insufficient documentation

## 2022-12-27 DIAGNOSIS — T451X5A Adverse effect of antineoplastic and immunosuppressive drugs, initial encounter: Secondary | ICD-10-CM | POA: Insufficient documentation

## 2022-12-27 DIAGNOSIS — Z803 Family history of malignant neoplasm of breast: Secondary | ICD-10-CM | POA: Diagnosis not present

## 2022-12-27 DIAGNOSIS — C50411 Malignant neoplasm of upper-outer quadrant of right female breast: Secondary | ICD-10-CM | POA: Insufficient documentation

## 2022-12-27 DIAGNOSIS — Z79899 Other long term (current) drug therapy: Secondary | ICD-10-CM | POA: Insufficient documentation

## 2022-12-27 DIAGNOSIS — Z5111 Encounter for antineoplastic chemotherapy: Secondary | ICD-10-CM | POA: Diagnosis present

## 2022-12-27 LAB — CBC WITH DIFFERENTIAL (CANCER CENTER ONLY)
Abs Immature Granulocytes: 0.01 10*3/uL (ref 0.00–0.07)
Basophils Absolute: 0.1 10*3/uL (ref 0.0–0.1)
Basophils Relative: 1 %
Eosinophils Absolute: 0.1 10*3/uL (ref 0.0–0.5)
Eosinophils Relative: 2 %
HCT: 33.8 % — ABNORMAL LOW (ref 36.0–46.0)
Hemoglobin: 11.3 g/dL — ABNORMAL LOW (ref 12.0–15.0)
Immature Granulocytes: 0 %
Lymphocytes Relative: 33 %
Lymphs Abs: 1.4 10*3/uL (ref 0.7–4.0)
MCH: 28.7 pg (ref 26.0–34.0)
MCHC: 33.4 g/dL (ref 30.0–36.0)
MCV: 85.8 fL (ref 80.0–100.0)
Monocytes Absolute: 0.9 10*3/uL (ref 0.1–1.0)
Monocytes Relative: 22 %
Neutro Abs: 1.7 10*3/uL (ref 1.7–7.7)
Neutrophils Relative %: 42 %
Platelet Count: 300 10*3/uL (ref 150–400)
RBC: 3.94 MIL/uL (ref 3.87–5.11)
RDW: 15.9 % — ABNORMAL HIGH (ref 11.5–15.5)
WBC Count: 4.2 10*3/uL (ref 4.0–10.5)
nRBC: 0 % (ref 0.0–0.2)

## 2022-12-27 LAB — CMP (CANCER CENTER ONLY)
ALT: 13 U/L (ref 0–44)
AST: 20 U/L (ref 15–41)
Albumin: 4.3 g/dL (ref 3.5–5.0)
Alkaline Phosphatase: 49 U/L (ref 38–126)
Anion gap: 10 (ref 5–15)
BUN: 10 mg/dL (ref 6–20)
CO2: 23 mmol/L (ref 22–32)
Calcium: 9 mg/dL (ref 8.9–10.3)
Chloride: 105 mmol/L (ref 98–111)
Creatinine: 0.63 mg/dL (ref 0.44–1.00)
GFR, Estimated: >60 mL/min (ref >60)
Glucose, Bld: 97 mg/dL (ref 70–99)
Potassium: 3.7 mmol/L (ref 3.5–5.1)
Sodium: 138 mmol/L (ref 135–145)
Total Bilirubin: 0.4 mg/dL (ref 0.3–1.2)
Total Protein: 7.1 g/dL (ref 6.5–8.1)

## 2022-12-27 MED ORDER — DOXORUBICIN HCL CHEMO IV INJECTION 2 MG/ML
60.0000 mg/m2 | Freq: Once | INTRAVENOUS | Status: AC
  Administered 2022-12-27: 94 mg via INTRAVENOUS
  Filled 2022-12-27: qty 47

## 2022-12-27 MED ORDER — HEPARIN SOD (PORK) LOCK FLUSH 100 UNIT/ML IV SOLN
500.0000 [IU] | Freq: Once | INTRAVENOUS | Status: AC | PRN
  Administered 2022-12-27: 500 [IU]
  Filled 2022-12-27: qty 5

## 2022-12-27 MED ORDER — SODIUM CHLORIDE 0.9 % IV SOLN
10.0000 mg | Freq: Once | INTRAVENOUS | Status: AC
  Administered 2022-12-27: 10 mg via INTRAVENOUS
  Filled 2022-12-27: qty 1
  Filled 2022-12-27: qty 10

## 2022-12-27 MED ORDER — SODIUM CHLORIDE 0.9 % IV SOLN
Freq: Once | INTRAVENOUS | Status: AC
  Filled 2022-12-27: qty 250

## 2022-12-27 MED ORDER — SODIUM CHLORIDE 0.9 % IV SOLN
600.0000 mg/m2 | Freq: Once | INTRAVENOUS | Status: AC
  Administered 2022-12-27: 1000 mg via INTRAVENOUS
  Filled 2022-12-27: qty 50

## 2022-12-27 MED ORDER — SODIUM CHLORIDE 0.9 % IV SOLN
150.0000 mg | Freq: Once | INTRAVENOUS | Status: AC
  Administered 2022-12-27: 150 mg via INTRAVENOUS
  Filled 2022-12-27: qty 150
  Filled 2022-12-27: qty 5

## 2022-12-27 MED ORDER — PALONOSETRON HCL INJECTION 0.25 MG/5ML
0.2500 mg | Freq: Once | INTRAVENOUS | Status: AC
  Administered 2022-12-27: 0.25 mg via INTRAVENOUS
  Filled 2022-12-27: qty 5

## 2022-12-27 NOTE — Addendum Note (Signed)
Addended by: Corene Cornea on: 12/27/2022 09:26 AM   Modules accepted: Orders

## 2022-12-27 NOTE — Patient Instructions (Signed)
[place] [facility]  Discharge Instructions: Thank you for choosing [facility] to provide your oncology and hematology care.  If you have a lab appointment with the Cancer Center, please go directly to the Cancer Center and check in at the registration area.  Wear comfortable clothing and clothing appropriate for easy access to any Portacath or PICC line.   We strive to give you quality time with your provider. You may need to reschedule your appointment if you arrive late (15 or more minutes).  Arriving late affects you and other patients whose appointments are after yours.  Also, if you miss three or more appointments without notifying the office, you may be dismissed from the clinic at the provider's discretion.      For prescription refill requests, have your pharmacy contact our office and allow 72 hours for refills to be completed.    Today you received the following chemotherapy and/or immunotherapy agents ADRIAMYCIN, CYTOXAN      To help prevent nausea and vomiting after your treatment, we encourage you to take your nausea medication as directed.  BELOW ARE SYMPTOMS THAT SHOULD BE REPORTED IMMEDIATELY: *FEVER GREATER THAN 100.4 F (38 C) OR HIGHER *CHILLS OR SWEATING *NAUSEA AND VOMITING THAT IS NOT CONTROLLED WITH YOUR NAUSEA MEDICATION *UNUSUAL SHORTNESS OF BREATH *UNUSUAL BRUISING OR BLEEDING *URINARY PROBLEMS (pain or burning when urinating, or frequent urination) *BOWEL PROBLEMS (unusual diarrhea, constipation, pain near the anus) TENDERNESS IN MOUTH AND THROAT WITH OR WITHOUT PRESENCE OF ULCERS (sore throat, sores in mouth, or a toothache) UNUSUAL RASH, SWELLING OR PAIN  UNUSUAL VAGINAL DISCHARGE OR ITCHING   Items with * indicate a potential emergency and should be followed up as soon as possible or go to the Emergency Department if any problems should occur.  Please show the CHEMOTHERAPY ALERT CARD or IMMUNOTHERAPY ALERT CARD at  check-in to the Emergency Department and triage nurse.  Should you have questions after your visit or need to cancel or reschedule your appointment, please contact [facility] [facility]  [phone] and follow the prompts.  Office hours are 8:00 a.m. to 4:30 p.m. Monday - Friday. Please note that voicemails left after 4:00 p.m. may not be returned until the following business day.  We are closed weekends and major holidays. You have access to a nurse at all times for urgent questions. Please call the main number to the clinic [phone] and follow the prompts.  For any non-urgent questions, you may also contact your provider using MyChart. We now offer e-Visits for anyone 18 and older to request care online for non-urgent symptoms. For details visit [email or web address].   Also download the MyChart app! Go to the app store, search "MyChart", open the app, select [facility], and log in with your MyChart username and password.  Doxorubicin Injection What is this medication? DOXORUBICIN (dox oh ROO bi sin) treats some types of cancer. It works by slowing down the growth of cancer cells. This medicine may be used for other purposes; ask your health care provider or pharmacist if you have questions. COMMON BRAND NAME(S): Adriamycin, Adriamycin PFS, Adriamycin RDF, Rubex What should I tell my care team before I take this medication? They need to know if you have any of these conditions: Heart disease History of low blood cell levels caused by a medication Liver disease Recent or ongoing radiation An unusual or allergic reaction to doxorubicin, other medications, foods, dyes, or preservatives If you or your  partner are pregnant or trying to get pregnant Breast-feeding How should I use this medication? This medication is injected into a vein. It is given by your care team in a hospital or clinic setting. Talk to your care team about the use of this medication in  children. Special care may be needed. Overdosage: If you think you have taken too much of this medicine contact a poison control center or emergency room at once. NOTE: This medicine is only for you. Do not share this medicine with others. What if I miss a dose? Keep appointments for follow-up doses. It is important not to miss your dose. Call your care team if you are unable to keep an appointment. What may interact with this medication? 6-mercaptopurine Paclitaxel Phenytoin St. John's wort Trastuzumab Verapamil This list may not describe all possible interactions. Give your health care provider a list of all the medicines, herbs, non-prescription drugs, or dietary supplements you use. Also tell them if you smoke, drink alcohol, or use illegal drugs. Some items may interact with your medicine. What should I watch for while using this medication? Your condition will be monitored carefully while you are receiving this medication. You may need blood work while taking this medication. This medication may make you feel generally unwell. This is not uncommon as chemotherapy can affect healthy cells as well as cancer cells. Report any side effects. Continue your course of treatment even though you feel ill unless your care team tells you to stop. There is a maximum amount of this medication you should receive throughout your life. The amount depends on the medical condition being treated and your overall health. Your care team will watch how much of this medication you receive. Tell your care team if you have taken this medication before. Your urine may turn red for a few days after your dose. This is not blood. If your urine is dark or brown, call your care team. In some cases, you may be given additional medications to help with side effects. Follow all directions for their use. This medication may increase your risk of getting an infection. Call your care team for advice if you get a fever, chills,  sore throat, or other symptoms of a cold or flu. Do not treat yourself. Try to avoid being around people who are sick. This medication may increase your risk to bruise or bleed. Call your care team if you notice any unusual bleeding. Talk to your care team about your risk of cancer. You may be more at risk for certain types of cancers if you take this medication. You should make sure that you get enough Coenzyme Q10 while you are taking this medication. Discuss the foods you eat and the vitamins you take with your care team. Talk to your care team if you or your partner may be pregnant. Serious birth defects can occur if you take this medication during pregnancy and for 6 months after the last dose. Contraception is recommended while taking this medication and for 6 months after the last dose. Your care team can help you find the option that works for you. If your partner can get pregnant, use a condom while taking this medication and for 6 months after the last dose. Do not breastfeed while taking this medication. This medication may cause infertility. Talk to your care team if you are concerned about your fertility. What side effects may I notice from receiving this medication? Side effects that you should report to your care  team as soon as possible: Allergic reactions--skin rash, itching, hives, swelling of the face, lips, tongue, or throat Heart failure--shortness of breath, swelling of the ankles, feet, or hands, sudden weight gain, unusual weakness or fatigue Heart rhythm changes--fast or irregular heartbeat, dizziness, feeling faint or lightheaded, chest pain, trouble breathing Infection--fever, chills, cough, sore throat, wounds that don't heal, pain or trouble when passing urine, general feeling of discomfort or being unwell Low red blood cell level--unusual weakness or fatigue, dizziness, headache, trouble breathing Painful swelling, warmth, or redness of the skin, blisters or sores at the  infusion site Unusual bruising or bleeding Side effects that usually do not require medical attention (report to your care team if they continue or are bothersome): Diarrhea Hair loss Nausea Pain, redness, or swelling with sores inside the mouth or throat Red urine This list may not describe all possible side effects. Call your doctor for medical advice about side effects. You may report side effects to FDA at 1-800-FDA-1088. Where should I keep my medication? This medication is given in a hospital or clinic. It will not be stored at home. NOTE: This sheet is a summary. It may not cover all possible information. If you have questions about this medicine, talk to your doctor, pharmacist, or health care provider.  2024 Elsevier/Gold Standard (2021-10-18 00:00:00)  Cyclophosphamide Injection What is this medication? CYCLOPHOSPHAMIDE (sye kloe FOSS fa mide) treats some types of cancer. It works by slowing down the growth of cancer cells. This medicine may be used for other purposes; ask your health care provider or pharmacist if you have questions. COMMON BRAND NAME(S): Cyclophosphamide, Cytoxan, Neosar What should I tell my care team before I take this medication? They need to know if you have any of these conditions: Heart disease Irregular heartbeat or rhythm Infection Kidney problems Liver disease Low blood cell levels (white cells, platelets, or red blood cells) Lung disease Previous radiation Trouble passing urine An unusual or allergic reaction to cyclophosphamide, other medications, foods, dyes, or preservatives Pregnant or trying to get pregnant Breast-feeding How should I use this medication? This medication is injected into a vein. It is given by your care team in a hospital or clinic setting. Talk to your care team about the use of this medication in children. Special care may be needed. Overdosage: If you think you have taken too much of this medicine contact a poison  control center or emergency room at once. NOTE: This medicine is only for you. Do not share this medicine with others. What if I miss a dose? Keep appointments for follow-up doses. It is important not to miss your dose. Call your care team if you are unable to keep an appointment. What may interact with this medication? Amphotericin B Amiodarone Azathioprine Certain antivirals for HIV or hepatitis Certain medications for blood pressure, such as enalapril, lisinopril, quinapril Cyclosporine Diuretics Etanercept Indomethacin Medications that relax muscles Metronidazole Natalizumab Tamoxifen Warfarin This list may not describe all possible interactions. Give your health care provider a list of all the medicines, herbs, non-prescription drugs, or dietary supplements you use. Also tell them if you smoke, drink alcohol, or use illegal drugs. Some items may interact with your medicine. What should I watch for while using this medication? This medication may make you feel generally unwell. This is not uncommon as chemotherapy can affect healthy cells as well as cancer cells. Report any side effects. Continue your course of treatment even though you feel ill unless your care team tells you to  stop. You may need blood work while you are taking this medication. This medication may increase your risk of getting an infection. Call your care team for advice if you get a fever, chills, sore throat, or other symptoms of a cold or flu. Do not treat yourself. Try to avoid being around people who are sick. Avoid taking medications that contain aspirin, acetaminophen, ibuprofen, naproxen, or ketoprofen unless instructed by your care team. These medications may hide a fever. Be careful brushing or flossing your teeth or using a toothpick because you may get an infection or bleed more easily. If you have any dental work done, tell your dentist you are receiving this medication. Drink water or other fluids as  directed. Urinate often, even at night. Some products may contain alcohol. Ask your care team if this medication contains alcohol. Be sure to tell all care teams you are taking this medicine. Certain medicines, like metronidazole and disulfiram, can cause an unpleasant reaction when taken with alcohol. The reaction includes flushing, headache, nausea, vomiting, sweating, and increased thirst. The reaction can last from 30 minutes to several hours. Talk to your care team if you wish to become pregnant or think you might be pregnant. This medication can cause serious birth defects if taken during pregnancy and for 1 year after the last dose. A negative pregnancy test is required before starting this medication. A reliable form of contraception is recommended while taking this medication and for 1 year after the last dose. Talk to your care team about reliable forms of contraception. Do not father a child while taking this medication and for 4 months after the last dose. Use a condom during this time period. Do not breast-feed while taking this medication or for 1 week after the last dose. This medication may cause infertility. Talk to your care team if you are concerned about your fertility. Talk to your care team about your risk of cancer. You may be more at risk for certain types of cancer if you take this medication. What side effects may I notice from receiving this medication? Side effects that you should report to your care team as soon as possible: Allergic reactions--skin rash, itching, hives, swelling of the face, lips, tongue, or throat Dry cough, shortness of breath or trouble breathing Heart failure--shortness of breath, swelling of the ankles, feet, or hands, sudden weight gain, unusual weakness or fatigue Heart muscle inflammation--unusual weakness or fatigue, shortness of breath, chest pain, fast or irregular heartbeat, dizziness, swelling of the ankles, feet, or hands Heart rhythm  changes--fast or irregular heartbeat, dizziness, feeling faint or lightheaded, chest pain, trouble breathing Infection--fever, chills, cough, sore throat, wounds that don't heal, pain or trouble when passing urine, general feeling of discomfort or being unwell Kidney injury--decrease in the amount of urine, swelling of the ankles, hands, or feet Liver injury--right upper belly pain, loss of appetite, nausea, light-colored stool, dark yellow or brown urine, yellowing skin or eyes, unusual weakness or fatigue Low red blood cell level--unusual weakness or fatigue, dizziness, headache, trouble breathing Low sodium level--muscle weakness, fatigue, dizziness, headache, confusion Red or dark brown urine Unusual bruising or bleeding Side effects that usually do not require medical attention (report to your care team if they continue or are bothersome): Hair loss Irregular menstrual cycles or spotting Loss of appetite Nausea Pain, redness, or swelling with sores inside the mouth or throat Vomiting This list may not describe all possible side effects. Call your doctor for medical advice about side effects.  You may report side effects to FDA at 1-800-FDA-1088. Where should I keep my medication? This medication is given in a hospital or clinic. It will not be stored at home. NOTE: This sheet is a summary. It may not cover all possible information. If you have questions about this medicine, talk to your doctor, pharmacist, or health care provider.  2024 Elsevier/Gold Standard (2021-10-26 00:00:00)

## 2022-12-27 NOTE — Progress Notes (Signed)
Hematology/Oncology Consult Note [facility]  Telephone:([phone] Fax:([phone]  Patient Care Team: Glori Luis, MD as PCP - General (Family Medicine) Hulen Luster, RN as Oncology Nurse Navigator   Name of the patient: Eileen Hart  [id number]  1990-12-02   Date of visit: 12/27/22  Diagnosis- clinical prognostic stage Ib invasive mammary carcinoma of the right breast cT1 cN0 M0 triple negative   Chief complaint/ Reason for visit-on treatment assessment prior to cycle 4 of neoadjuvant AC chemotherapy  Heme/Onc history: Patient is a 32 year old female who underwent a bilateral diagnostic mammogram on 10/07/2022 after she felt a palpable area of concern in her right breast. Mammogram showed a 1.6 x 1 x 1.5 cm solid mass in the right breast 9:30 position 5 cm from the nipple. There was also an adjacent 6 x 9 x 2 mm mass in the right breast. No right axillary adenopathy. The dominant mass was biopsied and was consistent with invasive mammary carcinoma grade 3 ER and HER2 negative.    Bilateral breast MRI showed additional areas of concern in the right breast.  2 additional enhancing masses 1 in the 12 o'clock position and 1 in the 4 o'clock position.  2 adjacent prominent right axillary lymph nodes.  2 intermittent masses in the left breast 9 o'clock position and upper outer left breast.  This was followed by a dedicated ultrasound.  She had 3 breast biopsies in the right side and 1 left breast biopsy and all of them were negative for malignancy.  On the ultrasound her right axillary lymph nodes appeared normal and therefore biopsy was not recommended for the same.   PET CT scan showed focal hypermetabolic activity in the right breast adjacent to the biopsy clip but no other additional areas of concern.  Baseline MUGA scan showed normal EF.  Given that additional biopsies were negative for malignancy and the only biopsy-proven site was 1.6 cm triple negative  breast cancer with negative lymph nodes plan was to do [facility] Taxol chemotherapy neoadjuvant without opting for keynote 522 regimen  Interval history- Returns to clinic for reconsideration of cycle 4 of chemotherapy. Tolerating well. Treatment held last week due to neutropenia. No interval fevers, chills, infections.   ECOG PS- 0 Pain scale- 0  Review of systems- Review of Systems  Constitutional:  Negative for chills, fever, malaise/fatigue and weight loss.  HENT:  Negative for congestion, ear discharge and nosebleeds.   Eyes:  Negative for blurred vision.  Respiratory:  Negative for cough, hemoptysis, sputum production, shortness of breath and wheezing.   Cardiovascular:  Negative for chest pain, palpitations, orthopnea and claudication.  Gastrointestinal:  Negative for abdominal pain, blood in stool, constipation, diarrhea, heartburn, melena, nausea and vomiting.  Genitourinary:  Negative for dysuria, flank pain, frequency, hematuria and urgency.  Musculoskeletal:  Negative for back pain, joint pain and myalgias.  Skin:  Negative for rash.  Neurological:  Negative for dizziness, tingling, focal weakness, seizures, weakness and headaches.  Endo/Heme/Allergies:  Does not bruise/bleed easily.  Psychiatric/Behavioral:  Negative for depression and suicidal ideas. The patient does not have insomnia.     No Known Allergies  Past Medical History:  Diagnosis Date   Chlamydia 01/25/2010   Depression with anxiety    Gestational hypertension    Immunization, viral disease    gardasil series completed   Tobacco abuse 05/02/2016   Past Surgical History:  Procedure Laterality Date   AUGMENTATION MAMMAPLASTY Bilateral    saline implants 2015  BREAST BIOPSY Right 10/14/2022   US biopsy/ coil clip/ path pending   BREAST BIOPSY Right 10/14/2022   [place] RT BREAST BX W LOC DEV 1ST LESION IMG BX SPEC US GUIDE 10/14/2022 [facility]   BREAST BIOPSY Left 10/29/2022   [place] Core Bx Ribbon clip- path  pending   BREAST BIOPSY Right 10/29/2022   [place] Core 1130 Venus Clip- path pending   BREAST BIOPSY Right 10/29/2022   [place] Core Bx Retroareolar heart clip-path pending   BREAST BIOPSY Right 10/29/2022   [place] Core Bx Ribbon Clip path pending   BREAST BIOPSY Right 10/29/2022   [place] RT BREAST BX W LOC DEV EA ADD LESION IMG BX SPEC US GUIDE 10/29/2022 [facility]   BREAST BIOPSY Right 10/29/2022   [place] RT BREAST BX W LOC DEV 1ST LESION IMG BX SPEC US GUIDE 10/29/2022 [facility]   BREAST BIOPSY Right 10/29/2022   [place] RT BREAST BX W LOC DEV EA ADD LESION IMG BX SPEC US GUIDE 10/29/2022 [facility]   BREAST BIOPSY Left 10/29/2022   [place] LT BREAST BX W LOC DEV 1ST LESION IMG BX SPEC US GUIDE 10/29/2022 [facility]   BREAST ENHANCEMENT SURGERY  2015   CESAREAN SECTION  2014   PORTACATH PLACEMENT N/A 10/24/2022   Procedure: INSERTION PORT-A-CATH;  Surgeon: Leafy Ro, MD;  Location: [facility];  Service: General;  Laterality: N/A;   TONSILLECTOMY     Social History   Socioeconomic History   Marital status: Single    Spouse name: Not on file   Number of children: 1   Years of education: 15   Highest education level: Not on file  Occupational History   Occupation: Groomer    Comment: [employer]  Tobacco Use   Smoking status: Former    Packs/day: .5    Types: Cigarettes    Quit date: 07/25/2018    Years since quitting: 4.4   Smokeless tobacco: Never   Tobacco comments:    quit 07/2018  Vaping Use   Vaping Use: Never used  Substance and Sexual Activity   Alcohol use: No    Alcohol/week: 0.0 standard drinks of alcohol   Drug use: No   Sexual activity: Yes    Birth control/protection: Pill  Other Topics Concern   Not on file  Social History Narrative   Not on file   Social Determinants of Health   Financial Resource Strain: Not on file  Food Insecurity: No Food Insecurity (10/22/2022)   Hunger Vital Sign    Worried About Running Out of Food in the Last Year: Never true    Ran  Out of Food in the Last Year: Never true  Transportation Needs: No Transportation Needs (10/22/2022)   PRAPARE - Administrator, Civil Service (Medical): No    Lack of Transportation (Non-Medical): No  Physical Activity: Not on file  Stress: Not on file  Social Connections: Not on file  Intimate Partner Violence: Not At Risk (10/22/2022)   Humiliation, Afraid, Rape, and Kick questionnaire    Fear of Current or Ex-Partner: No    Emotionally Abused: No    Physically Abused: No    Sexually Abused: No   Family History  Problem Relation Age of Onset   Breast cancer Other 35       mat 2nd cousin   Diabetes Neg Hx    Heart disease Neg Hx    Hypertension Neg Hx    Ovarian cancer Neg Hx    Colon cancer Neg  Hx     Current Outpatient Medications:    dexamethasone (DECADRON) 4 MG tablet, Take 2 tablets (8 mg total) by mouth daily for 3 days. Start the day after doxorubicin/cyclophosphamide chemotherapy. Take with food., Disp: 30 tablet, Rfl: 1   escitalopram (LEXAPRO) 10 MG tablet, TAKE 1 TABLET BY MOUTH EVERY DAY, Disp: 90 tablet, Rfl: 2   HYDROcodone-acetaminophen (NORCO/VICODIN) 5-325 MG tablet, Take 1-2 tablets by mouth every 6 (six) hours as needed for moderate pain., Disp: 15 tablet, Rfl: 0   lidocaine-prilocaine (EMLA) cream, Apply to affected area once, Disp: 30 g, Rfl: 3   norgestimate-ethinyl estradiol (ORTHO-CYCLEN) 0.25-35 MG-MCG tablet, Take 1 tablet by mouth daily., Disp: 84 tablet, Rfl: 3   ondansetron (ZOFRAN) 8 MG tablet, Take 1 tab (8 mg) by mouth every 8 hrs as needed for nausea/vomiting. Start third day after doxorubicin/cyclophosphamide chemotherapy., Disp: 30 tablet, Rfl: 1   prochlorperazine (COMPAZINE) 10 MG tablet, Take 1 tablet (10 mg total) by mouth every 6 (six) hours as needed for nausea or vomiting., Disp: 30 tablet, Rfl: 1   potassium chloride SA (KLOR-CON M) 20 MEQ tablet, Take 1 tablet (20 mEq total) by mouth daily., Disp: 30 tablet, Rfl: 0  Physical  exam:  Vitals:   12/27/22 0835  BP: 104/67  Pulse: 86  Temp: (!) 97 F (36.1 C)  TempSrc: Tympanic  SpO2: 99%  Weight: 120 lb (54.4 kg)   Physical Exam Vitals reviewed.  Cardiovascular:     Rate and Rhythm: Normal rate and regular rhythm.  Pulmonary:     Effort: Pulmonary effort is normal.  Abdominal:     General: There is no distension.     Palpations: Abdomen is soft.     Tenderness: There is no abdominal tenderness.  Skin:    General: Skin is warm and dry.  Neurological:     Mental Status: She is alert and oriented to person, place, and time.  Psychiatric:        Mood and Affect: Mood normal.        Behavior: Behavior normal.       Latest Ref Rng & Units 12/27/2022    8:27 AM  CMP  Glucose 70 - 99 mg/dL 97   BUN 6 - 20 mg/dL 10   Creatinine 6.29 - 1.00 mg/dL 5.28   Sodium 413 - 244 mmol/L 138   Potassium 3.5 - 5.1 mmol/L 3.7   Chloride 98 - 111 mmol/L 105   CO2 22 - 32 mmol/L 23   Calcium 8.9 - 10.3 mg/dL 9.0   Total Protein 6.5 - 8.1 g/dL 7.1   Total Bilirubin 0.3 - 1.2 mg/dL 0.4   Alkaline Phos 38 - 126 U/L 49   AST 15 - 41 U/L 20   ALT 0 - 44 U/L 13       Latest Ref Rng & Units 12/27/2022    8:27 AM  CBC  WBC 4.0 - 10.5 K/uL 4.2   Hemoglobin 12.0 - 15.0 g/dL 01.0   Hematocrit 27.2 - 46.0 % 33.8   Platelets 150 - 400 K/uL 300    No images are attached to the encounter.  No results found.   Assessment and plan- Patient is a 32 y.o. female   clinically prognostic stage Ib invasive mammary carcinoma of the right breast cT1c CN0CM0 triple negative.  She is here for on treatment assessment prior to cycle 4 of neoadjuvant dose dense AC chemotherapy. Treatment held last week due to neutropenia. Today, anc has  recovered. She has interval ultrasound scheduled for 01/02/23 to assess response. If no significant response, consider adding carboplatin to weekly taxol. Clinically mass is slightly smaller. Defer to Dr Smith Robert.  Chemotherapy induced neutropenia- previously  held neulasta but given drop in her counts, will proceed with neulasta today.  Chemotherapy induced nausea- well controlled with antiemetics Port-a-cath- in place and functioning well Goals of care- treatment given with curative intent  Disposition:  Proceed with cycle 4 of Adriamycin-Cytoxan chemotherapy D4 (Monday)- Neulasta 7/11- ultlrasound Rtc in 2 week- port/lab (cbc, cmp), Smith Robert, taxol- la   Visit Diagnosis 1. Malignant neoplasm of upper-outer quadrant of right breast in female, estrogen receptor negative (HCC)    Consuello Masse, DNP, AGNP-C, AOCNP [facility] [phone] (clinic) 12/27/2022  CC: Dr Smith Robert

## 2022-12-29 ENCOUNTER — Encounter: Payer: Self-pay | Admitting: Oncology

## 2022-12-30 ENCOUNTER — Ambulatory Visit: Payer: Medicaid Other | Admitting: Family Medicine

## 2022-12-30 ENCOUNTER — Inpatient Hospital Stay: Payer: Medicaid Other

## 2022-12-30 DIAGNOSIS — Z5111 Encounter for antineoplastic chemotherapy: Secondary | ICD-10-CM | POA: Diagnosis not present

## 2022-12-30 DIAGNOSIS — Z171 Estrogen receptor negative status [ER-]: Secondary | ICD-10-CM

## 2022-12-30 MED ORDER — PEGFILGRASTIM-CBQV 6 MG/0.6ML ~~LOC~~ SOSY
6.0000 mg | PREFILLED_SYRINGE | Freq: Once | SUBCUTANEOUS | Status: AC
  Administered 2022-12-30: 6 mg via SUBCUTANEOUS

## 2023-01-02 ENCOUNTER — Ambulatory Visit
Admission: RE | Admit: 2023-01-02 | Discharge: 2023-01-02 | Disposition: A | Discharge status: Home / Self Care | Payer: Medicaid Other | Source: Ambulatory Visit | Attending: Oncology | Admitting: Oncology

## 2023-01-02 DIAGNOSIS — Z171 Estrogen receptor negative status [ER-]: Secondary | ICD-10-CM | POA: Diagnosis present

## 2023-01-02 DIAGNOSIS — C50411 Malignant neoplasm of upper-outer quadrant of right female breast: Secondary | ICD-10-CM | POA: Diagnosis present

## 2023-01-03 ENCOUNTER — Inpatient Hospital Stay: Payer: Medicaid Other

## 2023-01-03 ENCOUNTER — Ambulatory Visit: Payer: Self-pay

## 2023-01-03 ENCOUNTER — Ambulatory Visit: Payer: Self-pay | Admitting: Oncology

## 2023-01-03 ENCOUNTER — Other Ambulatory Visit: Payer: Self-pay

## 2023-01-03 NOTE — Progress Notes (Signed)
Nutrition Assessment   Reason for Assessment:  Referral from [facility] as patient receiving chemotherapy  ASSESSMENT:  32 year old female with right breast cancer, triple negative.  Past medical history of depression, gestational HTN.  Patient receiving neoadjuvant AC and adding taxol.  Met with patient and mother in clinic.  Reports that appetite has decreased over the last few weeks.  Historically, has been a picky eater prior to treatment. Reports that she is unable to tolerate meat very well (taste, texture an issue).  Has nausea for few days after treatment but controlled with medication.  Able to eat breads, pastas, pork, eggs, sunflower seeds.  Yesterday able to eat at [employer], bread, bloomin onion, chicken (less than 1/2) and fries.  Drinking water and apple juice mostly. Denies diarrhea or mouth sores/pain.  Has tried ensure clear shakes. Does not really like milky type beverages.     Medications: zofran, compazine, decadron   Labs: reviewed   Anthropometrics:   Height: 64 inches Weight: 117 lb 7 oz UBW: 115-120 lb per patient.  Weight increased after starting medication (130-140 lb recently) BMI: 21   Estimated Energy Needs  Kcals: 1325-1600 Protein: 66-80 g Fluid: 1325-1600 ml   NUTRITION DIAGNOSIS: none at this time   INTERVENTION:  Discussed importance of nutrition during treatment and weight maintenance Encouraged foods high in protein. Examples discussed Discussed strategies to help with taste change.  Handout provided Oral nutrition supplements discussed  Contact information provided   MONITORING, EVALUATION, GOAL: weight trends, intake   Next Visit: Friday, August 2nd during infusion  Mattison Stuckey B. Freida Busman, RD, LDN Registered Dietitian [phone]

## 2023-01-06 ENCOUNTER — Encounter: Payer: Self-pay | Admitting: Oncology

## 2023-01-06 ENCOUNTER — Other Ambulatory Visit: Payer: Self-pay | Admitting: Oncology

## 2023-01-06 DIAGNOSIS — C50411 Malignant neoplasm of upper-outer quadrant of right female breast: Secondary | ICD-10-CM

## 2023-01-09 MED FILL — Dexamethasone Sodium Phosphate Inj 100 MG/10ML: INTRAMUSCULAR | Qty: 1 | Status: AC

## 2023-01-10 ENCOUNTER — Ambulatory Visit: Payer: Self-pay

## 2023-01-10 ENCOUNTER — Inpatient Hospital Stay: Payer: Medicaid Other

## 2023-01-10 ENCOUNTER — Other Ambulatory Visit: Payer: Self-pay

## 2023-01-10 ENCOUNTER — Other Ambulatory Visit: Payer: Self-pay | Admitting: Oncology

## 2023-01-10 ENCOUNTER — Encounter: Payer: Self-pay | Admitting: Oncology

## 2023-01-10 ENCOUNTER — Inpatient Hospital Stay: Payer: Medicaid Other | Admitting: Oncology

## 2023-01-10 ENCOUNTER — Inpatient Hospital Stay (HOSPITAL_BASED_OUTPATIENT_CLINIC_OR_DEPARTMENT_OTHER): Payer: Medicaid Other | Admitting: Oncology

## 2023-01-10 VITALS — BP 121/79 | HR 81 | Temp 98.5°F | Resp 18 | Ht 64.0 in | Wt 118.9 lb

## 2023-01-10 DIAGNOSIS — C50411 Malignant neoplasm of upper-outer quadrant of right female breast: Secondary | ICD-10-CM

## 2023-01-10 DIAGNOSIS — Z171 Estrogen receptor negative status [ER-]: Secondary | ICD-10-CM | POA: Diagnosis not present

## 2023-01-10 DIAGNOSIS — Z5111 Encounter for antineoplastic chemotherapy: Secondary | ICD-10-CM

## 2023-01-10 NOTE — Progress Notes (Signed)
Hematology/Oncology Consult note [facility]  Telephone:([phone] Fax:([phone]  Patient Care Team: Glori Luis, MD as PCP - General (Family Medicine) Hulen Luster, RN as Oncology Nurse Navigator   Name of the patient: Eileen Hart  [id number]  [birth year]   Date of visit: 01/10/23  Diagnosis-  clinical prognostic stage Ib invasive mammary carcinoma of the right breast cT1 cN0 M0 triple negative     Chief complaint/ Reason for visit- on treatment assessment prior to cycle 1 of neoadjuvant carbo/taxol chemotherapy  Heme/Onc history:  Patient is a 32 year old female who underwent a bilateral diagnostic mammogram on 10/07/2022 after she felt a palpable area of concern in her right breast. Mammogram showed a 1.6 x 1 x 1.5 cm solid mass in the right breast 9:30 position 5 cm from the nipple. There was also an adjacent 6 x 9 x 2 mm mass in the right breast. No right axillary adenopathy. The dominant mass was biopsied and was consistent with invasive mammary carcinoma grade 3 ER and HER2 negative.    Bilateral breast MRI showed additional areas of concern in the right breast.  2 additional enhancing masses 1 in the 12 o'clock position and 1 in the 4 o'clock position.  2 adjacent prominent right axillary lymph nodes.  2 intermittent masses in the left breast 9 o'clock position and upper outer left breast.  This was followed by a dedicated ultrasound.  She had 3 breast biopsies in the right side and 1 left breast biopsy and all of them were negative for malignancy.  On the ultrasound her right axillary lymph nodes appeared normal and therefore biopsy was not recommended for the same.   PET CT scan showed focal hypermetabolic activity in the right breast adjacent to the biopsy clip but no other additional areas of concern.  Baseline MUGA scan showed normal EF.   Given that additional biopsies were negative for malignancy and the only biopsy-proven site was 1.6  cm triple negative breast cancer with negative lymph nodes plan was to do [facility] Taxol chemotherapy neoadjuvant without opting for keynote 522 regimen.   Mild decrease in the size of primary breast mass after 4 cycles of AC chemotherapy. Carboplatin will teherfore be added to taxol neoadjuvantly  Interval history- she tolerated AC chemotherapy well except for mild fatigue. No significant nausea  ECOG PS- 0 Pain scale- 0   Review of systems- Review of Systems  Constitutional:  Positive for malaise/fatigue. Negative for chills, fever and weight loss.  HENT:  Negative for congestion, ear discharge and nosebleeds.   Eyes:  Negative for blurred vision.  Respiratory:  Negative for cough, hemoptysis, sputum production, shortness of breath and wheezing.   Cardiovascular:  Negative for chest pain, palpitations, orthopnea and claudication.  Gastrointestinal:  Negative for abdominal pain, blood in stool, constipation, diarrhea, heartburn, melena, nausea and vomiting.  Genitourinary:  Negative for dysuria, flank pain, frequency, hematuria and urgency.  Musculoskeletal:  Negative for back pain, joint pain and myalgias.  Skin:  Negative for rash.  Neurological:  Negative for dizziness, tingling, focal weakness, seizures, weakness and headaches.  Endo/Heme/Allergies:  Does not bruise/bleed easily.  Psychiatric/Behavioral:  Negative for depression and suicidal ideas. The patient does not have insomnia.       No Known Allergies   Past Medical History:  Diagnosis Date   Chlamydia 01/25/2010   Depression with anxiety    Gestational hypertension    Immunization, viral disease    gardasil series completed  Tobacco abuse 05/02/2016     Past Surgical History:  Procedure Laterality Date   AUGMENTATION MAMMAPLASTY Bilateral    saline implants 2015   BREAST BIOPSY Right 10/14/2022   US biopsy/ coil clip/ path pending   BREAST BIOPSY Right 10/14/2022   [place] RT BREAST BX W LOC DEV 1ST LESION IMG BX SPEC  US GUIDE 10/14/2022 [facility]   BREAST BIOPSY Left 10/29/2022   [place] Core Bx Ribbon clip- path pending   BREAST BIOPSY Right 10/29/2022   [place] Core 1130 Venus Clip- path pending   BREAST BIOPSY Right 10/29/2022   [place] Core Bx Retroareolar heart clip-path pending   BREAST BIOPSY Right 10/29/2022   [place] Core Bx Ribbon Clip path pending   BREAST BIOPSY Right 10/29/2022   [place] RT BREAST BX W LOC DEV EA ADD LESION IMG BX SPEC US GUIDE 10/29/2022 [facility]   BREAST BIOPSY Right 10/29/2022   [place] RT BREAST BX W LOC DEV 1ST LESION IMG BX SPEC US GUIDE 10/29/2022 [facility]   BREAST BIOPSY Right 10/29/2022   [place] RT BREAST BX W LOC DEV EA ADD LESION IMG BX SPEC US GUIDE 10/29/2022 [facility]   BREAST BIOPSY Left 10/29/2022   [place] LT BREAST BX W LOC DEV 1ST LESION IMG BX SPEC US GUIDE 10/29/2022 [facility]   BREAST ENHANCEMENT SURGERY  2015   CESAREAN SECTION  2014   PORTACATH PLACEMENT N/A 10/24/2022   Procedure: INSERTION PORT-A-CATH;  Surgeon: Leafy Ro, MD;  Location: [facility];  Service: General;  Laterality: N/A;   TONSILLECTOMY      Social History   Socioeconomic History   Marital status: Single    Spouse name: Not on file   Number of children: 1   Years of education: 15   Highest education level: Not on file  Occupational History   Occupation: Groomer    Comment: [employer]  Tobacco Use   Smoking status: Former    Current packs/day: 0.00    Types: Cigarettes    Quit date: 07/25/2018    Years since quitting: 4.4   Smokeless tobacco: Never   Tobacco comments:    quit 07/2018  Vaping Use   Vaping status: Never Used  Substance and Sexual Activity   Alcohol use: No    Alcohol/week: 0.0 standard drinks of alcohol   Drug use: No   Sexual activity: Yes    Birth control/protection: Pill  Other Topics Concern   Not on file  Social History Narrative   Not on file   Social Determinants of Health   Financial Resource Strain: Not on file  Food Insecurity: No Food  Insecurity (10/22/2022)   Hunger Vital Sign    Worried About Running Out of Food in the Last Year: Never true    Ran Out of Food in the Last Year: Never true  Transportation Needs: No Transportation Needs (10/22/2022)   PRAPARE - Administrator, Civil Service (Medical): No    Lack of Transportation (Non-Medical): No  Physical Activity: Not on file  Stress: Not on file  Social Connections: Not on file  Intimate Partner Violence: Not At Risk (10/22/2022)   Humiliation, Afraid, Rape, and Kick questionnaire    Fear of Current or Ex-Partner: No    Emotionally Abused: No    Physically Abused: No    Sexually Abused: No    Family History  Problem Relation Age of Onset   Breast cancer Other 35       mat 2nd cousin  Diabetes Neg Hx    Heart disease Neg Hx    Hypertension Neg Hx    Ovarian cancer Neg Hx    Colon cancer Neg Hx      Current Outpatient Medications:    dexamethasone (DECADRON) 4 MG tablet, Take 2 tablets (8 mg total) by mouth daily for 3 days. Start the day after doxorubicin/cyclophosphamide chemotherapy. Take with food., Disp: 30 tablet, Rfl: 1   escitalopram (LEXAPRO) 10 MG tablet, TAKE 1 TABLET BY MOUTH EVERY DAY, Disp: 90 tablet, Rfl: 2   HYDROcodone-acetaminophen (NORCO/VICODIN) 5-325 MG tablet, Take 1-2 tablets by mouth every 6 (six) hours as needed for moderate pain., Disp: 15 tablet, Rfl: 0   lidocaine-prilocaine (EMLA) cream, Apply to affected area once, Disp: 30 g, Rfl: 3   norgestimate-ethinyl estradiol (ORTHO-CYCLEN) 0.25-35 MG-MCG tablet, Take 1 tablet by mouth daily., Disp: 84 tablet, Rfl: 3   ondansetron (ZOFRAN) 8 MG tablet, Take 1 tab (8 mg) by mouth every 8 hrs as needed for nausea/vomiting. Start third day after doxorubicin/cyclophosphamide chemotherapy., Disp: 30 tablet, Rfl: 1   prochlorperazine (COMPAZINE) 10 MG tablet, Take 1 tablet (10 mg total) by mouth every 6 (six) hours as needed for nausea or vomiting., Disp: 30 tablet, Rfl: 1   potassium  chloride SA (KLOR-CON M) 20 MEQ tablet, Take 1 tablet (20 mEq total) by mouth daily. (Patient not taking: Reported on 01/10/2023), Disp: 30 tablet, Rfl: 0  Physical exam:  Vitals:   01/10/23 1201  BP: 121/79  Pulse: 81  Resp: 18  Temp: 98.5 F (36.9 C)  TempSrc: Tympanic  SpO2: 100%  Weight: 118 lb 14.4 oz (53.9 kg)  Height: 5\' 4"  (1.626 m)   Physical Exam Cardiovascular:     Rate and Rhythm: Normal rate and regular rhythm.     Heart sounds: Normal heart sounds.  Pulmonary:     Effort: Pulmonary effort is normal.  Skin:    General: Skin is warm and dry.  Neurological:     Mental Status: She is alert and oriented to person, place, and time.    Breast exam: palpable right breast mass about 1 cm in size     Latest Ref Rng & Units 12/27/2022    8:27 AM  CMP  Glucose 70 - 99 mg/dL 97   BUN 6 - 20 mg/dL 10   Creatinine 7.82 - 1.00 mg/dL 9.56   Sodium 213 - 086 mmol/L 138   Potassium 3.5 - 5.1 mmol/L 3.7   Chloride 98 - 111 mmol/L 105   CO2 22 - 32 mmol/L 23   Calcium 8.9 - 10.3 mg/dL 9.0   Total Protein 6.5 - 8.1 g/dL 7.1   Total Bilirubin 0.3 - 1.2 mg/dL 0.4   Alkaline Phos 38 - 126 U/L 49   AST 15 - 41 U/L 20   ALT 0 - 44 U/L 13       Latest Ref Rng & Units 12/27/2022    8:27 AM  CBC  WBC 4.0 - 10.5 K/uL 4.2   Hemoglobin 12.0 - 15.0 g/dL 57.8   Hematocrit 46.9 - 46.0 % 33.8   Platelets 150 - 400 K/uL 300     No images are attached to the encounter.  [place] LIMITED ULTRASOUND INCLUDING AXILLA RIGHT BREAST  Result Date: 01/02/2023 CLINICAL DATA:  The patient is status post 4 cycles of neoadjuvant chemotherapy for right breast 9:30 o'clock cancer. EXAM: ULTRASOUND OF THE RIGHT BREAST COMPARISON:  Previous exam(s). FINDINGS: Targeted right breast ultrasound is performed,  showing interval decrease in the known right breast cancer at 9:30 o'clock 5 cm from the nipple. The mass measures 1.2 x 0.7 x 1.3 cm. It previously measured 1.6 x 1.0 x 1.5 cm. IMPRESSION: Interval  decrease in the size of known right breast cancer. RECOMMENDATION: Continue with plan of care. I have discussed the findings and recommendations with the patient. If applicable, a reminder letter will be sent to the patient regarding the next appointment. BI-RADS CATEGORY  6: Known biopsy-proven malignancy. Electronically Signed   By: Ted Mcalpine M.D.   On: 01/02/2023 11:08     Assessment and plan- Patient is a 32 y.o. 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 She is s/p 4 cycles of neoadjuvant AC chemotherapy and heer for on treatment assessment prior to cycle 1 of carbo/taxol chemotherapy  Discussed the results of interim USG done after 4 cycles of AC chemotherapy which shows mild decrease in the size of right breast mass from 1.6 to 1.2 cm. Since she has not had a significant decrease, I am planning to add carboplatin to taxol neoadjuvantly hoping to achieve a PCR. Rande Lawman is stilll not approved for stage I TNBC and I am theerfore not adding that. Discussed risks and benefits of carbo/taxol chemo including all but not limited to infusion reactions, peripheral neuropathy, low blood counts and risk of infections. Patient understands and agrees to proceed as planned. She will proceed with cycle 1 on 01/13/23. Neupogen may be needed based on counts. She knows to use cryotherapy with gloves/ socks frozen during chemotherapy.  I will see her back in 2 weeks for cycle 3. Plan to complete 12 weekly cycles.  Given that her breast MRI showed multiple masses although negative for malignancy, given the possible need for continued surveillance/ biopsies etc in the future, she is leaning towards a b/l mastectomy with reconstruction. She has prior breast implants and the right one is leaking since last biopsy. I will have Dr. Everlene Farrier see her sooner to discuss this and arrange for him to arrange visit with plastic surgery as well    Visit Diagnosis 1. Malignant neoplasm of upper-outer quadrant of right breast in female, estrogen receptor negative (HCC)   2. Encounter for antineoplastic chemotherapy      Dr. Owens Shark, MD, MPH [facility] [phone] 01/10/2023 12:35 PM

## 2023-01-13 ENCOUNTER — Other Ambulatory Visit: Payer: Self-pay | Admitting: Oncology

## 2023-01-13 ENCOUNTER — Inpatient Hospital Stay: Payer: Medicaid Other

## 2023-01-13 ENCOUNTER — Encounter: Payer: Self-pay | Admitting: Oncology

## 2023-01-13 ENCOUNTER — Ambulatory Visit: Payer: Medicaid Other | Admitting: Oncology

## 2023-01-13 VITALS — BP 113/83 | HR 73 | Temp 99.2°F | Resp 20

## 2023-01-13 DIAGNOSIS — Z171 Estrogen receptor negative status [ER-]: Secondary | ICD-10-CM

## 2023-01-13 DIAGNOSIS — Z5111 Encounter for antineoplastic chemotherapy: Secondary | ICD-10-CM | POA: Diagnosis not present

## 2023-01-13 LAB — PREGNANCY, URINE: Preg Test, Ur: NEGATIVE

## 2023-01-13 LAB — CMP (CANCER CENTER ONLY)
ALT: 13 U/L (ref 0–44)
AST: 17 U/L (ref 15–41)
Albumin: 4.3 g/dL (ref 3.5–5.0)
Alkaline Phosphatase: 45 U/L (ref 38–126)
Anion gap: 8 (ref 5–15)
BUN: 8 mg/dL (ref 6–20)
CO2: 23 mmol/L (ref 22–32)
Calcium: 9.2 mg/dL (ref 8.9–10.3)
Chloride: 107 mmol/L (ref 98–111)
Creatinine: 0.7 mg/dL (ref 0.44–1.00)
GFR, Estimated: >60 mL/min (ref >60)
Glucose, Bld: 99 mg/dL (ref 70–99)
Potassium: 3.7 mmol/L (ref 3.5–5.1)
Sodium: 138 mmol/L (ref 135–145)
Total Bilirubin: 0.4 mg/dL (ref 0.3–1.2)
Total Protein: 6.1 g/dL — ABNORMAL LOW (ref 6.5–8.1)

## 2023-01-13 LAB — CBC WITH DIFFERENTIAL (CANCER CENTER ONLY)
Abs Immature Granulocytes: 0.19 10*3/uL — ABNORMAL HIGH (ref 0.00–0.07)
Basophils Absolute: 0.1 10*3/uL (ref 0.0–0.1)
Basophils Relative: 1 %
Eosinophils Absolute: 0.1 10*3/uL (ref 0.0–0.5)
Eosinophils Relative: 1 %
HCT: 33.4 % — ABNORMAL LOW (ref 36.0–46.0)
Hemoglobin: 11.1 g/dL — ABNORMAL LOW (ref 12.0–15.0)
Immature Granulocytes: 2 %
Lymphocytes Relative: 15 %
Lymphs Abs: 1.4 10*3/uL (ref 0.7–4.0)
MCH: 29.4 pg (ref 26.0–34.0)
MCHC: 33.2 g/dL (ref 30.0–36.0)
MCV: 88.6 fL (ref 80.0–100.0)
Monocytes Absolute: 1 10*3/uL (ref 0.1–1.0)
Monocytes Relative: 10 %
Neutro Abs: 6.6 10*3/uL (ref 1.7–7.7)
Neutrophils Relative %: 71 %
Platelet Count: 192 10*3/uL (ref 150–400)
RBC: 3.77 MIL/uL — ABNORMAL LOW (ref 3.87–5.11)
RDW: 16.3 % — ABNORMAL HIGH (ref 11.5–15.5)
WBC Count: 9.3 10*3/uL (ref 4.0–10.5)
nRBC: 0 % (ref 0.0–0.2)

## 2023-01-13 MED ORDER — PALONOSETRON HCL INJECTION 0.25 MG/5ML
0.2500 mg | Freq: Once | INTRAVENOUS | Status: AC
  Administered 2023-01-13: 0.25 mg via INTRAVENOUS
  Filled 2023-01-13: qty 5

## 2023-01-13 MED ORDER — SODIUM CHLORIDE 0.9 % IV SOLN
Freq: Once | INTRAVENOUS | Status: AC
  Filled 2023-01-13: qty 250

## 2023-01-13 MED ORDER — SODIUM CHLORIDE 0.9% FLUSH
10.0000 mL | INTRAVENOUS | Status: DC | PRN
  Administered 2023-01-13: 10 mL
  Filled 2023-01-13: qty 10

## 2023-01-13 MED ORDER — HEPARIN SOD (PORK) LOCK FLUSH 100 UNIT/ML IV SOLN
500.0000 [IU] | Freq: Once | INTRAVENOUS | Status: AC | PRN
  Administered 2023-01-13: 500 [IU]
  Filled 2023-01-13: qty 5

## 2023-01-13 MED ORDER — FAMOTIDINE IN NACL 20-0.9 MG/50ML-% IV SOLN
20.0000 mg | Freq: Once | INTRAVENOUS | Status: AC
  Administered 2023-01-13: 20 mg via INTRAVENOUS
  Filled 2023-01-13: qty 50

## 2023-01-13 MED ORDER — SODIUM CHLORIDE 0.9 % IV SOLN
171.6000 mg | Freq: Once | INTRAVENOUS | Status: AC
  Administered 2023-01-13: 170 mg via INTRAVENOUS
  Filled 2023-01-13: qty 17

## 2023-01-13 MED ORDER — SODIUM CHLORIDE 0.9 % IV SOLN
80.0000 mg/m2 | Freq: Once | INTRAVENOUS | Status: AC
  Administered 2023-01-13: 126 mg via INTRAVENOUS
  Filled 2023-01-13: qty 21

## 2023-01-13 MED ORDER — SODIUM CHLORIDE 0.9 % IV SOLN
10.0000 mg | Freq: Once | INTRAVENOUS | Status: AC
  Administered 2023-01-13: 10 mg via INTRAVENOUS
  Filled 2023-01-13: qty 1

## 2023-01-13 MED ORDER — DIPHENHYDRAMINE HCL 50 MG/ML IJ SOLN
50.0000 mg | Freq: Once | INTRAMUSCULAR | Status: AC
  Administered 2023-01-13: 50 mg via INTRAVENOUS
  Filled 2023-01-13: qty 1

## 2023-01-13 NOTE — Patient Instructions (Signed)
[place] [facility]  Discharge Instructions: Thank you for choosing [facility] to provide your oncology and hematology care.  If you have a lab appointment with the Cancer Center, please go directly to the Cancer Center and check in at the registration area.  Wear comfortable clothing and clothing appropriate for easy access to any Portacath or PICC line.   We strive to give you quality time with your provider. You may need to reschedule your appointment if you arrive late (15 or more minutes).  Arriving late affects you and other patients whose appointments are after yours.  Also, if you miss three or more appointments without notifying the office, you may be dismissed from the clinic at the provider's discretion.      For prescription refill requests, have your pharmacy contact our office and allow 72 hours for refills to be completed.    Today you received the following chemotherapy and/or immunotherapy agents taxol, carboplatin      To help prevent nausea and vomiting after your treatment, we encourage you to take your nausea medication as directed.  BELOW ARE SYMPTOMS THAT SHOULD BE REPORTED IMMEDIATELY: *FEVER GREATER THAN 100.4 F (38 C) OR HIGHER *CHILLS OR SWEATING *NAUSEA AND VOMITING THAT IS NOT CONTROLLED WITH YOUR NAUSEA MEDICATION *UNUSUAL SHORTNESS OF BREATH *UNUSUAL BRUISING OR BLEEDING *URINARY PROBLEMS (pain or burning when urinating, or frequent urination) *BOWEL PROBLEMS (unusual diarrhea, constipation, pain near the anus) TENDERNESS IN MOUTH AND THROAT WITH OR WITHOUT PRESENCE OF ULCERS (sore throat, sores in mouth, or a toothache) UNUSUAL RASH, SWELLING OR PAIN  UNUSUAL VAGINAL DISCHARGE OR ITCHING   Items with * indicate a potential emergency and should be followed up as soon as possible or go to the Emergency Department if any problems should occur.  Please show the CHEMOTHERAPY ALERT CARD or IMMUNOTHERAPY ALERT CARD at  check-in to the Emergency Department and triage nurse.  Should you have questions after your visit or need to cancel or reschedule your appointment, please contact [place] [facility]  [phone] and follow the prompts.  Office hours are 8:00 a.m. to 4:30 p.m. Monday - Friday. Please note that voicemails left after 4:00 p.m. may not be returned until the following business day.  We are closed weekends and major holidays. You have access to a nurse at all times for urgent questions. Please call the main number to the clinic [phone] and follow the prompts.  For any non-urgent questions, you may also contact your provider using MyChart. We now offer e-Visits for anyone 13 and older to request care online for non-urgent symptoms. For details visit [email or web address].   Also download the MyChart app! Go to the app store, search "MyChart", open the app, select [place], and log in with your MyChart username and password.  Paclitaxel Injection What is this medication? PACLITAXEL (PAK li TAX el) treats some types of cancer. It works by slowing down the growth of cancer cells. This medicine may be used for other purposes; ask your health care provider or pharmacist if you have questions. COMMON BRAND NAME(S): Onxol, Taxol What should I tell my care team before I take this medication? They need to know if you have any of these conditions: Heart disease Liver disease Low white blood cell levels An unusual or allergic reaction to paclitaxel, other medications, foods, dyes, or preservatives If you or your partner are pregnant or trying to get pregnant Breast-feeding How should I use this  medication? This medication is injected into a vein. It is given by your care team in a hospital or clinic setting. Talk to your care team about the use of this medication in children. While it may be given to children for selected conditions, precautions do apply. Overdosage: If  you think you have taken too much of this medicine contact a poison control center or emergency room at once. NOTE: This medicine is only for you. Do not share this medicine with others. What if I miss a dose? Keep appointments for follow-up doses. It is important not to miss your dose. Call your care team if you are unable to keep an appointment. What may interact with this medication? Do not take this medication with any of the following: Live virus vaccines Other medications may affect the way this medication works. Talk with your care team about all of the medications you take. They may suggest changes to your treatment plan to lower the risk of side effects and to make sure your medications work as intended. This list may not describe all possible interactions. Give your health care provider a list of all the medicines, herbs, non-prescription drugs, or dietary supplements you use. Also tell them if you smoke, drink alcohol, or use illegal drugs. Some items may interact with your medicine. What should I watch for while using this medication? Your condition will be monitored carefully while you are receiving this medication. You may need blood work while taking this medication. This medication may make you feel generally unwell. This is not uncommon as chemotherapy can affect healthy cells as well as cancer cells. Report any side effects. Continue your course of treatment even though you feel ill unless your care team tells you to stop. This medication can cause serious allergic reactions. To reduce the risk, your care team may give you other medications to take before receiving this one. Be sure to follow the directions from your care team. This medication may increase your risk of getting an infection. Call your care team for advice if you get a fever, chills, sore throat, or other symptoms of a cold or flu. Do not treat yourself. Try to avoid being around people who are sick. This medication may  increase your risk to bruise or bleed. Call your care team if you notice any unusual bleeding. Be careful brushing or flossing your teeth or using a toothpick because you may get an infection or bleed more easily. If you have any dental work done, tell your dentist you are receiving this medication. Talk to your care team if you may be pregnant. Serious birth defects can occur if you take this medication during pregnancy. Talk to your care team before breastfeeding. Changes to your treatment plan may be needed. What side effects may I notice from receiving this medication? Side effects that you should report to your care team as soon as possible: Allergic reactions--skin rash, itching, hives, swelling of the face, lips, tongue, or throat Heart rhythm changes--fast or irregular heartbeat, dizziness, feeling faint or lightheaded, chest pain, trouble breathing Increase in blood pressure Infection--fever, chills, cough, sore throat, wounds that don't heal, pain or trouble when passing urine, general feeling of discomfort or being unwell Low blood pressure--dizziness, feeling faint or lightheaded, blurry vision Low red blood cell level--unusual weakness or fatigue, dizziness, headache, trouble breathing Painful swelling, warmth, or redness of the skin, blisters or sores at the infusion site Pain, tingling, or numbness in the hands or feet Slow heartbeat--dizziness, feeling  faint or lightheaded, confusion, trouble breathing, unusual weakness or fatigue Unusual bruising or bleeding Side effects that usually do not require medical attention (report to your care team if they continue or are bothersome): Diarrhea Hair loss Joint pain Loss of appetite Muscle pain Nausea Vomiting This list may not describe all possible side effects. Call your doctor for medical advice about side effects. You may report side effects to FDA at 1-800-FDA-1088. Where should I keep my medication? This medication is given in  a hospital or clinic. It will not be stored at home. NOTE: This sheet is a summary. It may not cover all possible information. If you have questions about this medicine, talk to your doctor, pharmacist, or health care provider.  2024 Elsevier/Gold Standard (2021-10-30 00:00:00)  Carboplatin Injection What is this medication? CARBOPLATIN (KAR boe pla tin) treats some types of cancer. It works by slowing down the growth of cancer cells. This medicine may be used for other purposes; ask your health care provider or pharmacist if you have questions. COMMON BRAND NAME(S): Paraplatin What should I tell my care team before I take this medication? They need to know if you have any of these conditions: Blood disorders Hearing problems Kidney disease Recent or ongoing radiation therapy An unusual or allergic reaction to carboplatin, cisplatin, other medications, foods, dyes, or preservatives Pregnant or trying to get pregnant Breast-feeding How should I use this medication? This medication is injected into a vein. It is given by your care team in a hospital or clinic setting. Talk to your care team about the use of this medication in children. Special care may be needed. Overdosage: If you think you have taken too much of this medicine contact a poison control center or emergency room at once. NOTE: This medicine is only for you. Do not share this medicine with others. What if I miss a dose? Keep appointments for follow-up doses. It is important not to miss your dose. Call your care team if you are unable to keep an appointment. What may interact with this medication? Medications for seizures Some antibiotics, such as amikacin, gentamicin, neomycin, streptomycin, tobramycin Vaccines This list may not describe all possible interactions. Give your health care provider a list of all the medicines, herbs, non-prescription drugs, or dietary supplements you use. Also tell them if you smoke, drink  alcohol, or use illegal drugs. Some items may interact with your medicine. What should I watch for while using this medication? Your condition will be monitored carefully while you are receiving this medication. You may need blood work while taking this medication. This medication may make you feel generally unwell. This is not uncommon, as chemotherapy can affect healthy cells as well as cancer cells. Report any side effects. Continue your course of treatment even though you feel ill unless your care team tells you to stop. In some cases, you may be given additional medications to help with side effects. Follow all directions for their use. This medication may increase your risk of getting an infection. Call your care team for advice if you get a fever, chills, sore throat, or other symptoms of a cold or flu. Do not treat yourself. Try to avoid being around people who are sick. Avoid taking medications that contain aspirin, acetaminophen, ibuprofen, naproxen, or ketoprofen unless instructed by your care team. These medications may hide a fever. Be careful brushing or flossing your teeth or using a toothpick because you may get an infection or bleed more easily. If you have  any dental work done, tell your dentist you are receiving this medication. Talk to your care team if you wish to become pregnant or think you might be pregnant. This medication can cause serious birth defects. Talk to your care team about effective forms of contraception. Do not breast-feed while taking this medication. What side effects may I notice from receiving this medication? Side effects that you should report to your care team as soon as possible: Allergic reactions--skin rash, itching, hives, swelling of the face, lips, tongue, or throat Infection--fever, chills, cough, sore throat, wounds that don't heal, pain or trouble when passing urine, general feeling of discomfort or being unwell Low red blood cell level--unusual  weakness or fatigue, dizziness, headache, trouble breathing Pain, tingling, or numbness in the hands or feet, muscle weakness, change in vision, confusion or trouble speaking, loss of balance or coordination, trouble walking, seizures Unusual bruising or bleeding Side effects that usually do not require medical attention (report to your care team if they continue or are bothersome): Hair loss Nausea Unusual weakness or fatigue Vomiting This list may not describe all possible side effects. Call your doctor for medical advice about side effects. You may report side effects to FDA at 1-800-FDA-1088. Where should I keep my medication? This medication is given in a hospital or clinic. It will not be stored at home. NOTE: This sheet is a summary. It may not cover all possible information. If you have questions about this medicine, talk to your doctor, pharmacist, or health care provider.  2024 Elsevier/Gold Standard (2021-10-02 00:00:00)

## 2023-01-15 ENCOUNTER — Ambulatory Visit: Payer: Medicaid Other | Admitting: Surgery

## 2023-01-15 ENCOUNTER — Encounter: Payer: Self-pay | Admitting: Surgery

## 2023-01-15 VITALS — BP 112/78 | HR 105 | Temp 98.4°F | Ht 64.0 in | Wt 115.6 lb

## 2023-01-15 DIAGNOSIS — C50411 Malignant neoplasm of upper-outer quadrant of right female breast: Secondary | ICD-10-CM | POA: Diagnosis not present

## 2023-01-15 DIAGNOSIS — Z171 Estrogen receptor negative status [ER-]: Secondary | ICD-10-CM

## 2023-01-15 NOTE — Patient Instructions (Addendum)
A referral has been placed to plastic surgery. They will call you for an appointment.   If you have any concerns or questions, please feel free to call our office.  Total or Modified Radical Mastectomy A total mastectomy and a modified radical mastectomy are surgeries that are done as part of treatment for breast cancer. Both types involve removing a breast. In a total mastectomy (simple mastectomy), all breast tissue including the nipple will be removed. In a modified radical mastectomy, lymph nodes under the arm will be removed along with the breast and nipple. Some of the lining over the muscle tissues under the breast may also be removed. These procedures may also be used to help prevent breast cancer. A preventive (prophylactic) mastectomy may be done if you are at an increased risk of breast cancer due to harmful changes (mutations) in certain genes, such as the BRCA genes. In that case, the procedure involves removing both of your breasts. This can reduce your risk of developing breast cancer in the future. For a transgender person, a total mastectomy may be done as part of a surgical transition from female to female. Let your health care provider know about: Any allergies you have. All medicines you are taking, including vitamins, herbs, eye drops, creams, and over-the-counter medicines. Any problems you or family members have had with anesthetic medicines. Any bleeding problems you have. Any surgeries you have had. Any medical conditions you have. Whether you are pregnant or may be pregnant. What are the risks? Generally, this is a safe procedure. However, problems may occur, including: Infection. Bleeding. Allergic reactions to medicines. Scar tissue. Chest numbness, sensation of throbbing, or tingling on the side of the surgery. Fluid buildup under the skin flaps where your breast was removed (seroma). Stress or sadness from losing your breast. If you have the lymph nodes under  your arm removed, you may have arm swelling, weakness, or numbness on the same side of your body as your surgery. What happens before the procedure? When to stop eating and drinking Follow instructions from your health care provider about what you may eat and drink before your procedure. These may include: 8 hours before your procedure Stop eating most foods. Do not eat meat, fried foods, or fatty foods. Eat only light foods, such as toast or crackers. All liquids are okay except energy drinks and alcohol. 6 hours before your procedure Stop eating. Drink only clear liquids, such as water, clear fruit juice, black coffee, plain tea, and sports drinks. Do not drink energy drinks or alcohol. 2 hours before your procedure Stop drinking all liquids. You may be allowed to take medicines with small sips of water. If you do not follow your health care provider's instructions, your procedure may be delayed or canceled. Medicines Ask your health care provider about: Changing or stopping your regular medicines. This is especially important if you are taking diabetes medicines or blood thinners. Taking medicines such as aspirin and ibuprofen. These medicines can thin your blood. Do not take these medicines unless your health care provider tells you to take them. Taking over-the-counter medicines, vitamins, herbs, and supplements. General instructions You may be checked for extra fluid around your lymph nodes (lymphedema). Do not use any products that contain nicotine or tobacco before the procedure. These products include cigarettes, chewing tobacco, and vaping devices, such as e-cigarettes. If you need help quitting, ask your health care provider. Ask your health care provider about: How your surgery site will be marked. What steps  will be taken to help prevent infection. These steps may include: Removing hair at the surgery site. Washing skin with a germ-killing soap. Taking antibiotic  medicine. What happens during the procedure? An IV will be inserted into one of your veins. You will be given: A medicine to help you relax (sedative). A medicine to make you fall asleep (general anesthetic). A wide incision will be made around your nipple. The skin of the breast and the nipple inside the incision will be removed along with all breast tissue. Lymph nodes under the arm on the side of the tumor will be checked to see if the cancer has spread. If you are having a modified radical mastectomy: The lining over your chest muscles will be removed. The incision may be extended to reach the lymph nodes under your arm, or a second incision may be made. Lymph nodes will be removed. Breast tissue and lymph nodes that are removed will be sent to the lab for testing. You may have a drainage tube inserted into your incision to collect fluid that builds up after surgery. This tube will be connected to a suction bulb on the outside of your body to remove the fluid. Your incision or incisions will be closed with stitches (sutures), skin glue, or adhesive strips. A bandage (dressing) will be placed over your breast area. If lymph nodes were removed, a dressing will also be placed under your arm. The procedure may vary among health care providers and hospitals. What happens after the procedure? Your blood pressure, heart rate, breathing rate, and blood oxygen level will be monitored until you leave the hospital or clinic. You will be given pain medicine as needed. Your IV can be removed when you are able to eat and drink. You may have a drainage tube in place for 2-3 days to prevent a collection of blood (hematoma) from developing in the breast area. You will be given instructions about caring for the drain before you go home. A pressure bandage may be applied for 1-2 days to prevent bleeding or swelling. Ask your health care provider how to care for your pressure bandage at home. Summary In a  total mastectomy (simple mastectomy), all breast tissue including the nipple will be removed. In a modified radical mastectomy, lymph nodes under the arm will be removed along with the breast and nipple, and the chest wall lining. Before the procedure, follow instructions from your health care provider about eating and drinking, and ask about changing or stopping your regular medicines. You may have a drainage tube inserted into your incision to collect fluid that builds up after surgery. This tube will be connected to a suction bulb on the outside of your body to remove the fluid. This information is not intended to replace advice given to you by your health care provider. Make sure you discuss any questions you have with your health care provider. Document Revised: 03/11/2021 Document Reviewed: 03/11/2021 Elsevier Patient Education  2024 [employer].

## 2023-01-16 ENCOUNTER — Encounter: Payer: Self-pay | Admitting: Oncology

## 2023-01-16 ENCOUNTER — Encounter: Payer: Self-pay | Admitting: Surgery

## 2023-01-16 NOTE — Progress Notes (Signed)
Outpatient Surgical Follow Up  01/16/2023  Eileen Hart is an 32 y.o. female.   Chief Complaint  Patient presents with   Follow-up    Right breast cancer    HPI:  Eileen Hart is a 32 y.o. female seen in f/u w a diagnosis of triple negative right breast CA T1c N0 M0. Mass on Mammogram  1.6 x 1 x 1.5 cm solid mass in the right breast 9:30 position 5 cm from the nipple.  She did have neoadjuvant with some response.  She did HAve bilateral MRI w additional biopsies. She had 3 additional breast biopsies in the right side and 1 left breast biopsy and all of them were negative for malignancy. PET CT scan showed focal hypermetabolic activity in the right breast adjacent to the biopsy clip but no other additional areas of concern.  She did have a history of breast augmentation with submuscular saline implants.   She Is able to perform more than 4 METS of activity without any shortness of breath or chest pain.  She quit smoking more than 3 years ago but she still uses nicotine products.  Her mother had a history of first-degree cousin with breast cancer that developed around age 2.  No other family members with cancer. Menarche at age 51.  Gravida 2 para 1M1.  She did have breast augmentation surgery right after breast-feeding. She Does take birth control pills She Did have a C-section 9 years ago. SHe is accompanied by her mother  Past Medical History:  Diagnosis Date   Chlamydia 01/25/2010   Depression with anxiety    Gestational hypertension    Immunization, viral disease    gardasil series completed   Tobacco abuse 05/02/2016    Past Surgical History:  Procedure Laterality Date   AUGMENTATION MAMMAPLASTY Bilateral    saline implants 2015   BREAST BIOPSY Right 10/14/2022   US biopsy/ coil clip/ path pending   BREAST BIOPSY Right 10/14/2022   [place] RT BREAST BX W LOC DEV 1ST LESION IMG BX SPEC US GUIDE 10/14/2022 [facility]   BREAST BIOPSY Left 10/29/2022   [place] Core Bx Ribbon  clip- path pending   BREAST BIOPSY Right 10/29/2022   [place] Core 1130 Venus Clip- path pending   BREAST BIOPSY Right 10/29/2022   [place] Core Bx Retroareolar heart clip-path pending   BREAST BIOPSY Right 10/29/2022   [place] Core Bx Ribbon Clip path pending   BREAST BIOPSY Right 10/29/2022   [place] RT BREAST BX W LOC DEV EA ADD LESION IMG BX SPEC US GUIDE 10/29/2022 [facility]   BREAST BIOPSY Right 10/29/2022   [place] RT BREAST BX W LOC DEV 1ST LESION IMG BX SPEC US GUIDE 10/29/2022 [facility]   BREAST BIOPSY Right 10/29/2022   [place] RT BREAST BX W LOC DEV EA ADD LESION IMG BX SPEC US GUIDE 10/29/2022 [facility]   BREAST BIOPSY Left 10/29/2022   [place] LT BREAST BX W LOC DEV 1ST LESION IMG BX SPEC US GUIDE 10/29/2022 [facility]   BREAST ENHANCEMENT SURGERY  2015   CESAREAN SECTION  2014   PORTACATH PLACEMENT N/A 10/24/2022   Procedure: INSERTION PORT-A-CATH;  Surgeon: Leafy Ro, MD;  Location: [facility];  Service: General;  Laterality: N/A;   TONSILLECTOMY      Family History  Problem Relation Age of Onset   Breast cancer Other 35       mat 2nd cousin   Diabetes Neg Hx    Heart disease Neg Hx  Hypertension Neg Hx    Ovarian cancer Neg Hx    Colon cancer Neg Hx     Social History:  reports that she quit smoking about 4 years ago. Her smoking use included cigarettes. She has never used smokeless tobacco. She reports that she does not drink alcohol and does not use drugs.  Allergies: No Known Allergies  Medications reviewed.    ROS Full ROS performed and is otherwise negative other than what is stated in HPI   BP 112/78   Pulse (!) 105   Temp 98.4 F (36.9 C) (Oral)   Ht 5\' 4"  (1.626 m)   Wt 115 lb 9.6 oz (52.4 kg)   SpO2 97%   BMI 19.84 kg/m   Physical Exam CONSTITUTIONAL: NAD. EYES: Pupils are equal, round, Sclera are non-icteric. EARS, NOSE, MOUTH AND THROAT: The oral mucosa is pink and moist. Hearing is intact to voice. LYMPH NODES:  Lymph nodes in the neck are  normal. RESPIRATORY:  Lungs are clear. There is normal respiratory effort, with equal breath sounds bilaterally, and without pathologic use of accessory muscles. CARDIOVASCULAR: Heart is regular without murmurs, gallops, or rubs. BREAST: I can not longer palpate prior nodule on the Right breast.  The left breast tissue seems to be dense There is no evidence of axillary lymphadenopathy Evidence of prior breast augmentation surgery bilaterally. Right breast is ptotic and is asymmetric compared to the Left side ( right is smaller) GI: The abdomen is  soft, nontender, and nondistended. There are no palpable masses. There is no hepatosplenomegaly. There are normal bowel sounds in all quadrants. GU: Rectal deferred.   MUSCULOSKELETAL: Normal muscle strength and tone. No cyanosis or edema.   SKIN: Turgor is good and there are no pathologic skin lesions or ulcers. NEUROLOGIC: Motor and sensation is grossly normal. Cranial nerves are grossly intact. PSYCH:  Oriented to person, place and time. Affect is normal.    Assessment/Plan:  32 y.o. 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 She is s/p 4 cycles of neoadjuvant AC chemotherapy w some response. Here for discussion of surgical excision. Options to include bilateral mastectomy with reconstruction and potential excision of implants were discussed with the patient in detail.  Our other  option will be to to perform a right lumpectomy Savi scout guided with sentinel lymph node biopsy and then for her to complete her radiation therapy and after she completes or her oncologic treatments she may opt for reconstruction / revision of right breast +/- implant replacement mammoplasty. She dos have  Prior subpectoral breast implants.  She seems to think that the right prior implant is ruptured. She is certainly asymmetric but not 100% sure if the implant is ruptured. She definitely will  benefit from surgical discussion w plastics regarding reconstruction and current implants  I have requested plastics consult and d/w Dr Ulice Bold the situation in detail. I will see her back in a few weeks for another discussion. Please note I spent 50 minutes in this encounter including personally reviewing medical records, images studies, coordinating her care, placing orders, counseling the patient and performing documentation.    Sterling Big, MD [facility] General Surgeon

## 2023-01-17 ENCOUNTER — Inpatient Hospital Stay: Payer: Medicaid Other | Admitting: Oncology

## 2023-01-17 ENCOUNTER — Encounter: Payer: Self-pay | Admitting: Oncology

## 2023-01-17 ENCOUNTER — Inpatient Hospital Stay: Payer: Medicaid Other

## 2023-01-17 MED FILL — Dexamethasone Sodium Phosphate Inj 100 MG/10ML: INTRAMUSCULAR | Qty: 1 | Status: AC

## 2023-01-20 ENCOUNTER — Ambulatory Visit: Payer: Medicaid Other

## 2023-01-20 ENCOUNTER — Inpatient Hospital Stay: Payer: Medicaid Other

## 2023-01-20 ENCOUNTER — Ambulatory Visit: Payer: Medicaid Other | Admitting: Oncology

## 2023-01-20 ENCOUNTER — Other Ambulatory Visit: Payer: Self-pay | Admitting: *Deleted

## 2023-01-20 VITALS — BP 115/72 | HR 89 | Temp 99.5°F | Resp 16

## 2023-01-20 DIAGNOSIS — C50411 Malignant neoplasm of upper-outer quadrant of right female breast: Secondary | ICD-10-CM

## 2023-01-20 DIAGNOSIS — Z5111 Encounter for antineoplastic chemotherapy: Secondary | ICD-10-CM | POA: Diagnosis not present

## 2023-01-20 LAB — CBC WITH DIFFERENTIAL (CANCER CENTER ONLY)
Abs Immature Granulocytes: 0.01 10*3/uL (ref 0.00–0.07)
Basophils Absolute: 0.1 10*3/uL (ref 0.0–0.1)
Basophils Relative: 2 %
Eosinophils Absolute: 0 10*3/uL (ref 0.0–0.5)
Eosinophils Relative: 1 %
HCT: 31.2 % — ABNORMAL LOW (ref 36.0–46.0)
Hemoglobin: 10.4 g/dL — ABNORMAL LOW (ref 12.0–15.0)
Immature Granulocytes: 0 %
Lymphocytes Relative: 21 %
Lymphs Abs: 0.8 10*3/uL (ref 0.7–4.0)
MCH: 29 pg (ref 26.0–34.0)
MCHC: 33.3 g/dL (ref 30.0–36.0)
MCV: 86.9 fL (ref 80.0–100.0)
Monocytes Absolute: 0.3 10*3/uL (ref 0.1–1.0)
Monocytes Relative: 9 %
Neutro Abs: 2.6 10*3/uL (ref 1.7–7.7)
Neutrophils Relative %: 67 %
Platelet Count: 331 10*3/uL (ref 150–400)
RBC: 3.59 MIL/uL — ABNORMAL LOW (ref 3.87–5.11)
RDW: 15.7 % — ABNORMAL HIGH (ref 11.5–15.5)
WBC Count: 3.9 10*3/uL — ABNORMAL LOW (ref 4.0–10.5)
nRBC: 0 % (ref 0.0–0.2)

## 2023-01-20 LAB — CMP (CANCER CENTER ONLY)
ALT: 15 U/L (ref 0–44)
AST: 15 U/L (ref 15–41)
Albumin: 4 g/dL (ref 3.5–5.0)
Alkaline Phosphatase: 46 U/L (ref 38–126)
Anion gap: 7 (ref 5–15)
BUN: 13 mg/dL (ref 6–20)
CO2: 24 mmol/L (ref 22–32)
Calcium: 9.1 mg/dL (ref 8.9–10.3)
Chloride: 106 mmol/L (ref 98–111)
Creatinine: 0.5 mg/dL (ref 0.44–1.00)
GFR, Estimated: >60 mL/min (ref >60)
Glucose, Bld: 103 mg/dL — ABNORMAL HIGH (ref 70–99)
Potassium: 3.6 mmol/L (ref 3.5–5.1)
Sodium: 137 mmol/L (ref 135–145)
Total Bilirubin: 0.4 mg/dL (ref 0.3–1.2)
Total Protein: 7 g/dL (ref 6.5–8.1)

## 2023-01-20 LAB — PREGNANCY, URINE: Preg Test, Ur: NEGATIVE

## 2023-01-20 MED ORDER — HEPARIN SOD (PORK) LOCK FLUSH 100 UNIT/ML IV SOLN
500.0000 [IU] | Freq: Once | INTRAVENOUS | Status: AC | PRN
  Administered 2023-01-20: 500 [IU]
  Filled 2023-01-20: qty 5

## 2023-01-20 MED ORDER — SODIUM CHLORIDE 0.9 % IV SOLN
80.0000 mg/m2 | Freq: Once | INTRAVENOUS | Status: AC
  Administered 2023-01-20: 126 mg via INTRAVENOUS
  Filled 2023-01-20: qty 21

## 2023-01-20 MED ORDER — PALONOSETRON HCL INJECTION 0.25 MG/5ML
0.2500 mg | Freq: Once | INTRAVENOUS | Status: AC
  Administered 2023-01-20: 0.25 mg via INTRAVENOUS
  Filled 2023-01-20: qty 5

## 2023-01-20 MED ORDER — SODIUM CHLORIDE 0.9 % IV SOLN
10.0000 mg | Freq: Once | INTRAVENOUS | Status: AC
  Administered 2023-01-20: 10 mg via INTRAVENOUS
  Filled 2023-01-20: qty 10

## 2023-01-20 MED ORDER — SODIUM CHLORIDE 0.9 % IV SOLN
171.6000 mg | Freq: Once | INTRAVENOUS | Status: AC
  Administered 2023-01-20: 170 mg via INTRAVENOUS
  Filled 2023-01-20: qty 17

## 2023-01-20 MED ORDER — DIPHENHYDRAMINE HCL 50 MG/ML IJ SOLN
50.0000 mg | Freq: Once | INTRAMUSCULAR | Status: AC
  Administered 2023-01-20: 50 mg via INTRAVENOUS
  Filled 2023-01-20: qty 1

## 2023-01-20 MED ORDER — FAMOTIDINE IN NACL 20-0.9 MG/50ML-% IV SOLN
20.0000 mg | Freq: Once | INTRAVENOUS | Status: AC
  Administered 2023-01-20: 20 mg via INTRAVENOUS
  Filled 2023-01-20: qty 50

## 2023-01-20 MED ORDER — SODIUM CHLORIDE 0.9 % IV SOLN
Freq: Once | INTRAVENOUS | Status: AC
  Filled 2023-01-20: qty 250

## 2023-01-20 NOTE — Patient Instructions (Signed)
Eileen Hart  Discharge Instructions: Thank you for choosing [facility] to provide your oncology and hematology care.  If you have a lab appointment with the [facility], please go directly to the [facility] and check in at the registration area.  Wear comfortable clothing and clothing appropriate for easy access to any Portacath or PICC line.   We strive to give you quality time with your provider. You may need to reschedule your appointment if you arrive late (15 or more minutes).  Arriving late affects you and other patients whose appointments are after yours.  Also, if you miss three or more appointments without notifying the office, you may be dismissed from the clinic at the provider's discretion.      For prescription refill requests, have your pharmacy contact our office and allow 72 hours for refills to be completed.    Today you received the following chemotherapy and/or immunotherapy agents Paclitaxel and Carboplatin.      To help prevent nausea and vomiting after your treatment, we encourage you to take your nausea medication as directed.  BELOW ARE SYMPTOMS THAT SHOULD BE REPORTED IMMEDIATELY: *FEVER GREATER THAN 100.4 F (38 C) OR HIGHER *CHILLS OR SWEATING *NAUSEA AND VOMITING THAT IS NOT CONTROLLED WITH YOUR NAUSEA MEDICATION *UNUSUAL SHORTNESS OF BREATH *UNUSUAL BRUISING OR BLEEDING *URINARY PROBLEMS (pain or burning when urinating, or frequent urination) *BOWEL PROBLEMS (unusual diarrhea, constipation, pain near the anus) TENDERNESS IN MOUTH AND THROAT WITH OR WITHOUT PRESENCE OF ULCERS (sore throat, sores in mouth, or a toothache) UNUSUAL RASH, SWELLING OR PAIN  UNUSUAL VAGINAL DISCHARGE OR ITCHING   Items with * indicate a potential emergency and should be followed up as soon as possible or go to the Emergency Department if any problems should occur.  Please show the CHEMOTHERAPY ALERT CARD or IMMUNOTHERAPY ALERT  CARD at check-in to the Emergency Department and triage nurse.  Should you have questions after your visit or need to cancel or reschedule your appointment, please contact [facility]  [phone] and follow the prompts.  Office hours are 8:00 a.m. to 4:30 p.m. Monday - Friday. Please note that voicemails left after 4:00 p.m. may not be returned until the following business day.  We are closed weekends and major holidays. You have access to a nurse at all times for urgent questions. Please call the main number to the clinic [phone] and follow the prompts.  For any non-urgent questions, you may also contact your provider using MyChart. We now offer e-Visits for anyone 53 and older to request care online for non-urgent symptoms. For details visit [email or web address].   Also download the MyChart app! Go to the app store, search "MyChart", open the app, select [facility], and log in with your MyChart username and password.

## 2023-01-21 ENCOUNTER — Encounter: Payer: Self-pay | Admitting: Oncology

## 2023-01-24 ENCOUNTER — Other Ambulatory Visit: Payer: Medicaid Other

## 2023-01-24 ENCOUNTER — Encounter: Payer: Self-pay | Admitting: Oncology

## 2023-01-24 ENCOUNTER — Ambulatory Visit: Payer: Medicaid Other

## 2023-01-24 ENCOUNTER — Inpatient Hospital Stay: Payer: Medicaid Other | Attending: Oncology

## 2023-01-24 DIAGNOSIS — Z5189 Encounter for other specified aftercare: Secondary | ICD-10-CM | POA: Insufficient documentation

## 2023-01-24 DIAGNOSIS — C50411 Malignant neoplasm of upper-outer quadrant of right female breast: Secondary | ICD-10-CM | POA: Insufficient documentation

## 2023-01-24 DIAGNOSIS — Z79899 Other long term (current) drug therapy: Secondary | ICD-10-CM | POA: Insufficient documentation

## 2023-01-24 DIAGNOSIS — Z5111 Encounter for antineoplastic chemotherapy: Secondary | ICD-10-CM | POA: Insufficient documentation

## 2023-01-24 NOTE — Progress Notes (Signed)
Nutrition  Patient scheduled for nutrition visit today but did not show up.  Infusion scheduled changed.   Called patient for follow-up but no answer.   RD at another cancer center location on infusion day.    Will follow-up as able.     B. Freida Busman, RD, LDN Registered Dietitian [phone]

## 2023-01-27 ENCOUNTER — Ambulatory Visit: Payer: Medicaid Other

## 2023-01-27 ENCOUNTER — Inpatient Hospital Stay: Payer: Medicaid Other | Admitting: Oncology

## 2023-01-27 ENCOUNTER — Inpatient Hospital Stay: Payer: Medicaid Other

## 2023-01-27 ENCOUNTER — Encounter: Payer: Self-pay | Admitting: Oncology

## 2023-01-27 ENCOUNTER — Other Ambulatory Visit: Payer: Self-pay

## 2023-01-27 ENCOUNTER — Other Ambulatory Visit: Payer: Self-pay | Admitting: Oncology

## 2023-01-27 VITALS — BP 102/69 | HR 102 | Temp 96.8°F | Resp 18 | Wt 115.3 lb

## 2023-01-27 VITALS — HR 98

## 2023-01-27 DIAGNOSIS — Z171 Estrogen receptor negative status [ER-]: Secondary | ICD-10-CM | POA: Diagnosis not present

## 2023-01-27 DIAGNOSIS — T451X5A Adverse effect of antineoplastic and immunosuppressive drugs, initial encounter: Secondary | ICD-10-CM

## 2023-01-27 DIAGNOSIS — Z5111 Encounter for antineoplastic chemotherapy: Secondary | ICD-10-CM

## 2023-01-27 DIAGNOSIS — D701 Agranulocytosis secondary to cancer chemotherapy: Secondary | ICD-10-CM | POA: Diagnosis not present

## 2023-01-27 DIAGNOSIS — Z79899 Other long term (current) drug therapy: Secondary | ICD-10-CM | POA: Diagnosis not present

## 2023-01-27 DIAGNOSIS — C50411 Malignant neoplasm of upper-outer quadrant of right female breast: Secondary | ICD-10-CM | POA: Diagnosis not present

## 2023-01-27 DIAGNOSIS — Z5189 Encounter for other specified aftercare: Secondary | ICD-10-CM | POA: Diagnosis not present

## 2023-01-27 LAB — CBC WITH DIFFERENTIAL (CANCER CENTER ONLY)
Abs Immature Granulocytes: 0.02 10*3/uL (ref 0.00–0.07)
Basophils Absolute: 0.1 10*3/uL (ref 0.0–0.1)
Basophils Relative: 1 %
Eosinophils Absolute: 0.1 10*3/uL (ref 0.0–0.5)
Eosinophils Relative: 3 %
HCT: 30.3 % — ABNORMAL LOW (ref 36.0–46.0)
Hemoglobin: 10.3 g/dL — ABNORMAL LOW (ref 12.0–15.0)
Immature Granulocytes: 1 %
Lymphocytes Relative: 28 %
Lymphs Abs: 1 10*3/uL (ref 0.7–4.0)
MCH: 29.9 pg (ref 26.0–34.0)
MCHC: 34 g/dL (ref 30.0–36.0)
MCV: 87.8 fL (ref 80.0–100.0)
Monocytes Absolute: 0.4 10*3/uL (ref 0.1–1.0)
Monocytes Relative: 11 %
Neutro Abs: 2 10*3/uL (ref 1.7–7.7)
Neutrophils Relative %: 56 %
Platelet Count: 219 10*3/uL (ref 150–400)
RBC: 3.45 MIL/uL — ABNORMAL LOW (ref 3.87–5.11)
RDW: 16.2 % — ABNORMAL HIGH (ref 11.5–15.5)
WBC Count: 3.5 10*3/uL — ABNORMAL LOW (ref 4.0–10.5)
nRBC: 0 % (ref 0.0–0.2)

## 2023-01-27 LAB — CMP (CANCER CENTER ONLY)
ALT: 25 U/L (ref 0–44)
AST: 22 U/L (ref 15–41)
Albumin: 4.1 g/dL (ref 3.5–5.0)
Alkaline Phosphatase: 40 U/L (ref 38–126)
Anion gap: 7 (ref 5–15)
BUN: 9 mg/dL (ref 6–20)
CO2: 23 mmol/L (ref 22–32)
Calcium: 9.1 mg/dL (ref 8.9–10.3)
Chloride: 106 mmol/L (ref 98–111)
Creatinine: 0.59 mg/dL (ref 0.44–1.00)
GFR, Estimated: >60 mL/min (ref >60)
Glucose, Bld: 99 mg/dL (ref 70–99)
Potassium: 3.7 mmol/L (ref 3.5–5.1)
Sodium: 136 mmol/L (ref 135–145)
Total Bilirubin: 0.5 mg/dL (ref 0.3–1.2)
Total Protein: 6.8 g/dL (ref 6.5–8.1)

## 2023-01-27 LAB — PREGNANCY, URINE: Preg Test, Ur: NEGATIVE

## 2023-01-27 MED ORDER — SODIUM CHLORIDE 0.9 % IV SOLN
171.6000 mg | Freq: Once | INTRAVENOUS | Status: AC
  Administered 2023-01-27: 170 mg via INTRAVENOUS
  Filled 2023-01-27: qty 17

## 2023-01-27 MED ORDER — SODIUM CHLORIDE 0.9 % IV SOLN
80.0000 mg/m2 | Freq: Once | INTRAVENOUS | Status: AC
  Administered 2023-01-27: 126 mg via INTRAVENOUS
  Filled 2023-01-27: qty 21

## 2023-01-27 MED ORDER — SODIUM CHLORIDE 0.9 % IV SOLN
10.0000 mg | Freq: Once | INTRAVENOUS | Status: AC
  Administered 2023-01-27: 10 mg via INTRAVENOUS
  Filled 2023-01-27: qty 10

## 2023-01-27 MED ORDER — FAMOTIDINE IN NACL 20-0.9 MG/50ML-% IV SOLN
20.0000 mg | Freq: Once | INTRAVENOUS | Status: AC
  Administered 2023-01-27: 20 mg via INTRAVENOUS
  Filled 2023-01-27: qty 50

## 2023-01-27 MED ORDER — HEPARIN SOD (PORK) LOCK FLUSH 100 UNIT/ML IV SOLN
500.0000 [IU] | Freq: Once | INTRAVENOUS | Status: DC | PRN
  Filled 2023-01-27: qty 5

## 2023-01-27 MED ORDER — SODIUM CHLORIDE 0.9 % IV SOLN
Freq: Once | INTRAVENOUS | Status: AC
  Filled 2023-01-27: qty 250

## 2023-01-27 MED ORDER — PALONOSETRON HCL INJECTION 0.25 MG/5ML
0.2500 mg | Freq: Once | INTRAVENOUS | Status: AC
  Administered 2023-01-27: 0.25 mg via INTRAVENOUS
  Filled 2023-01-27: qty 5

## 2023-01-27 MED ORDER — DIPHENHYDRAMINE HCL 50 MG/ML IJ SOLN
50.0000 mg | Freq: Once | INTRAMUSCULAR | Status: AC
  Administered 2023-01-27: 50 mg via INTRAVENOUS
  Filled 2023-01-27: qty 1

## 2023-01-27 NOTE — Patient Instructions (Signed)
[facility]  Discharge Instructions: Thank you for choosing [facility] to provide your oncology and hematology care.  If you have a lab appointment with the Cancer Center, please go directly to the Cancer Center and check in at the registration area.  Wear comfortable clothing and clothing appropriate for easy access to any Portacath or PICC line.   We strive to give you quality time with your provider. You may need to reschedule your appointment if you arrive late (15 or more minutes).  Arriving late affects you and other patients whose appointments are after yours.  Also, if you miss three or more appointments without notifying the office, you may be dismissed from the clinic at the provider's discretion.      For prescription refill requests, have your pharmacy contact our office and allow 72 hours for refills to be completed.    Today you received the following chemotherapy and/or immunotherapy agents Taxol & Paraplatin      To help prevent nausea and vomiting after your treatment, we encourage you to take your nausea medication as directed.  BELOW ARE SYMPTOMS THAT SHOULD BE REPORTED IMMEDIATELY: *FEVER GREATER THAN 100.4 F (38 C) OR HIGHER *CHILLS OR SWEATING *NAUSEA AND VOMITING THAT IS NOT CONTROLLED WITH YOUR NAUSEA MEDICATION *UNUSUAL SHORTNESS OF BREATH *UNUSUAL BRUISING OR BLEEDING *URINARY PROBLEMS (pain or burning when urinating, or frequent urination) *BOWEL PROBLEMS (unusual diarrhea, constipation, pain near the anus) TENDERNESS IN MOUTH AND THROAT WITH OR WITHOUT PRESENCE OF ULCERS (sore throat, sores in mouth, or a toothache) UNUSUAL RASH, SWELLING OR PAIN  UNUSUAL VAGINAL DISCHARGE OR ITCHING   Items with * indicate a potential emergency and should be followed up as soon as possible or go to the Emergency Department if any problems should occur.  Please show the CHEMOTHERAPY ALERT CARD or IMMUNOTHERAPY ALERT CARD at  check-in to the Emergency Department and triage nurse.  Should you have questions after your visit or need to cancel or reschedule your appointment, please contact [facility]  [phone] and follow the prompts.  Office hours are 8:00 a.m. to 4:30 p.m. Monday - Friday. Please note that voicemails left after 4:00 p.m. may not be returned until the following business day.  We are closed weekends and major holidays. You have access to a nurse at all times for urgent questions. Please call the main number to the clinic [phone] and follow the prompts.  For any non-urgent questions, you may also contact your provider using MyChart. We now offer e-Visits for anyone 18 and older to request care online for non-urgent symptoms. For details visit [email or web address].   Also download the MyChart app! Go to the app store, search "MyChart", open the app, select [facility], and log in with your MyChart username and password.    

## 2023-01-27 NOTE — Progress Notes (Signed)
Hematology/Oncology Consult note [facility]  Telephone:([phone] [phone]  Patient Care Team: Glori Luis, MD as PCP - General (Family Medicine) Hulen Luster, RN as Oncology Nurse Navigator   Name of the patient: Eileen Hart  [id number]  January 11, 1991   Date of visit: 01/27/23  Diagnosis- clinical prognostic stage Ib invasive mammary carcinoma of the right breast cT1 cN0 M0 triple negative   Chief complaint/ Reason for visit-on treatment assessment prior to cycle 3 of neoadjuvant CarboTaxol chemotherapy  Heme/Onc history: Patient is a 32 year old female who underwent a bilateral diagnostic mammogram on 10/07/2022 after she felt a palpable area of concern in her right breast. Mammogram showed a 1.6 x 1 x 1.5 cm solid mass in the right breast 9:30 position 5 cm from the nipple. There was also an adjacent 6 x 9 x 2 mm mass in the right breast. No right axillary adenopathy. The dominant mass was biopsied and was consistent with invasive mammary carcinoma grade 3 ER and HER2 negative.    Bilateral breast MRI showed additional areas of concern in the right breast.  2 additional enhancing masses 1 in the 12 o'clock position and 1 in the 4 o'clock position.  2 adjacent prominent right axillary lymph nodes.  2 intermittent masses in the left breast 9 o'clock position and upper outer left breast.  This was followed by a dedicated ultrasound.  She had 3 breast biopsies in the right side and 1 left breast biopsy and all of them were negative for malignancy.  On the ultrasound her right axillary lymph nodes appeared normal and therefore biopsy was not recommended for the same.   PET CT scan showed focal hypermetabolic activity in the right breast adjacent to the biopsy clip but no other additional areas of concern.  Baseline MUGA scan showed normal EF.   Given that additional biopsies were negative for malignancy and the only biopsy-proven site was 1.6 cm  triple negative breast cancer with negative lymph nodes plan was to do [facility] Taxol chemotherapy neoadjuvant without opting for keynote 522 regimen.    Mild decrease in the size of primary breast mass after 4 cycles of AC chemotherapy. Carboplatin added to taxol neoadjuvantly    Interval history-tolerating treatments well.  She has experienced less fatigue with CarboTaxol chemotherapy as compared to [facility] chemotherapy.  She had mild self-limiting tingling numbness in her hands which lasted for a few hours a day after chemotherapy but has resolved since then.  ECOG PS- 0 Pain scale- 0   Review of systems- Review of Systems  Constitutional:  Negative for chills, fever, malaise/fatigue and weight loss.  HENT:  Negative for congestion, ear discharge and nosebleeds.   Eyes:  Negative for blurred vision.  Respiratory:  Negative for cough, hemoptysis, sputum production, shortness of breath and wheezing.   Cardiovascular:  Negative for chest pain, palpitations, orthopnea and claudication.  Gastrointestinal:  Negative for abdominal pain, blood in stool, constipation, diarrhea, heartburn, melena, nausea and vomiting.  Genitourinary:  Negative for dysuria, flank pain, frequency, hematuria and urgency.  Musculoskeletal:  Negative for back pain, joint pain and myalgias.  Skin:  Negative for rash.  Neurological:  Negative for dizziness, tingling, focal weakness, seizures, weakness and headaches.  Endo/Heme/Allergies:  Does not bruise/bleed easily.  Psychiatric/Behavioral:  Negative for depression and suicidal ideas. The patient does not have insomnia.       No Known Allergies   Past Medical History:  Diagnosis Date   Chlamydia 01/25/2010  Depression with anxiety    Gestational hypertension    Immunization, viral disease    gardasil series completed   Tobacco abuse 05/02/2016     Past Surgical History:  Procedure Laterality Date   AUGMENTATION MAMMAPLASTY Bilateral    saline implants 2015    BREAST BIOPSY Right 10/14/2022   US biopsy/ coil clip/ path pending   BREAST BIOPSY Right 10/14/2022   [place] RT BREAST BX W LOC DEV 1ST LESION IMG BX SPEC US GUIDE 10/14/2022 [facility]   BREAST BIOPSY Left 10/29/2022   [place] Core Bx Ribbon clip- path pending   BREAST BIOPSY Right 10/29/2022   [place] Core 1130 Venus Clip- path pending   BREAST BIOPSY Right 10/29/2022   [place] Core Bx Retroareolar heart clip-path pending   BREAST BIOPSY Right 10/29/2022   [place] Core Bx Ribbon Clip path pending   BREAST BIOPSY Right 10/29/2022   [place] RT BREAST BX W LOC DEV EA ADD LESION IMG BX SPEC US GUIDE 10/29/2022 [facility]   BREAST BIOPSY Right 10/29/2022   [place] RT BREAST BX W LOC DEV 1ST LESION IMG BX SPEC US GUIDE 10/29/2022 [facility]   BREAST BIOPSY Right 10/29/2022   [place] RT BREAST BX W LOC DEV EA ADD LESION IMG BX SPEC US GUIDE 10/29/2022 [facility]   BREAST BIOPSY Left 10/29/2022   [place] LT BREAST BX W LOC DEV 1ST LESION IMG BX SPEC US GUIDE 10/29/2022 [facility]   BREAST ENHANCEMENT SURGERY  2015   CESAREAN SECTION  2014   PORTACATH PLACEMENT N/A 10/24/2022   Procedure: INSERTION PORT-A-CATH;  Surgeon: Leafy Ro, MD;  Location: [facility];  Service: General;  Laterality: N/A;   TONSILLECTOMY      Social History   Socioeconomic History   Marital status: Single    Spouse name: Not on file   Number of children: 1   Years of education: 15   Highest education level: Not on file  Occupational History   Occupation: Groomer    Comment: [employer]  Tobacco Use   Smoking status: Former    Current packs/day: 0.00    Types: Cigarettes    Quit date: 07/25/2018    Years since quitting: 4.5   Smokeless tobacco: Never   Tobacco comments:    quit 07/2018  Vaping Use   Vaping status: Never Used  Substance and Sexual Activity   Alcohol use: No    Alcohol/week: 0.0 standard drinks of alcohol   Drug use: No   Sexual activity: Yes    Birth control/protection: Pill  Other Topics Concern   Not  on file  Social History Narrative   Not on file   Social Determinants of Health   Financial Resource Strain: Not on file  Food Insecurity: No Food Insecurity (10/22/2022)   Hunger Vital Sign    Worried About Running Out of Food in the Last Year: Never true    Ran Out of Food in the Last Year: Never true  Transportation Needs: No Transportation Needs (10/22/2022)   PRAPARE - Administrator, Civil Service (Medical): No    Lack of Transportation (Non-Medical): No  Physical Activity: Not on file  Stress: Not on file  Social Connections: Not on file  Intimate Partner Violence: Not At Risk (10/22/2022)   Humiliation, Afraid, Rape, and Kick questionnaire    Fear of Current or Ex-Partner: No    Emotionally Abused: No    Physically Abused: No    Sexually Abused: No    Family History  Problem Relation Age of Onset   Breast cancer Other 35       mat 2nd cousin   Diabetes Neg Hx    Heart disease Neg Hx    Hypertension Neg Hx    Ovarian cancer Neg Hx    Colon cancer Neg Hx      Current Outpatient Medications:    dexamethasone (DECADRON) 4 MG tablet, Take 2 tablets (8 mg total) by mouth daily for 3 days. Start the day after doxorubicin/cyclophosphamide chemotherapy. Take with food., Disp: 30 tablet, Rfl: 1   HYDROcodone-acetaminophen (NORCO/VICODIN) 5-325 MG tablet, Take 1-2 tablets by mouth every 6 (six) hours as needed for moderate pain., Disp: 15 tablet, Rfl: 0   lidocaine-prilocaine (EMLA) cream, Apply to affected area once, Disp: 30 g, Rfl: 3   norgestimate-ethinyl estradiol (ORTHO-CYCLEN) 0.25-35 MG-MCG tablet, Take 1 tablet by mouth daily., Disp: 84 tablet, Rfl: 3   ondansetron (ZOFRAN) 8 MG tablet, Take 1 tab (8 mg) by mouth every 8 hrs as needed for nausea/vomiting. Start third day after doxorubicin/cyclophosphamide chemotherapy., Disp: 30 tablet, Rfl: 1   prochlorperazine (COMPAZINE) 10 MG tablet, Take 1 tablet (10 mg total) by mouth every 6 (six) hours as needed for  nausea or vomiting., Disp: 30 tablet, Rfl: 1   escitalopram (LEXAPRO) 10 MG tablet, TAKE 1 TABLET BY MOUTH EVERY DAY (Patient not taking: Reported on 01/27/2023), Disp: 90 tablet, Rfl: 2 No current facility-administered medications for this visit.  Facility-Administered Medications Ordered in Other Visits:    CARBOplatin (PARAPLATIN) 170 mg in sodium chloride 0.9 % 100 mL chemo infusion, 170 mg, Intravenous, Once, Creig Hines, MD   dexamethasone (DECADRON) 10 mg in sodium chloride 0.9 % 50 mL IVPB, 10 mg, Intravenous, Once, Creig Hines, MD   diphenhydrAMINE (BENADRYL) injection 50 mg, 50 mg, Intravenous, Once, Creig Hines, MD   famotidine (PEPCID) IVPB 20 mg premix, 20 mg, Intravenous, Once, Creig Hines, MD   heparin lock flush 100 unit/mL, 500 Units, Intracatheter, Once PRN, Creig Hines, MD   PACLitaxel (TAXOL) 126 mg in sodium chloride 0.9 % 250 mL chemo infusion (</= 80mg /m2), 80 mg/m2 (Treatment Plan Recorded), Intravenous, Once, Creig Hines, MD   palonosetron (ALOXI) injection 0.25 mg, 0.25 mg, Intravenous, Once, Creig Hines, MD  Physical exam:  Vitals:   01/27/23 0926  BP: 102/69  Pulse: (!) 102  Resp: 18  Temp: (!) 96.8 F (36 C)  TempSrc: Tympanic  SpO2: 100%  Weight: 115 lb 4.8 oz (52.3 kg)   Physical Exam Cardiovascular:     Rate and Rhythm: Regular rhythm. Tachycardia present.     Heart sounds: Normal heart sounds.  Pulmonary:     Effort: Pulmonary effort is normal.  Skin:    General: Skin is warm and dry.  Neurological:     Mental Status: She is alert and oriented to person, place, and time.   Breast exam: There is a palpable 1 cm mass in the right breast at the 9 o'clock position and it appears somewhat smaller as compared to 2 weeks before.     Latest Ref Rng & Units 01/27/2023    9:20 AM  CMP  Glucose 70 - 99 mg/dL 99   BUN 6 - 20 mg/dL 9   Creatinine 2.13 - 0.86 mg/dL 5.78   Sodium 469 - 629 mmol/L 136   Potassium 3.5 - 5.1 mmol/L 3.7    Chloride 98 - 111 mmol/L 106   CO2 22 - 32 mmol/L  23   Calcium 8.9 - 10.3 mg/dL 9.1   Total Protein 6.5 - 8.1 g/dL 6.8   Total Bilirubin 0.3 - 1.2 mg/dL 0.5   Alkaline Phos 38 - 126 U/L 40   AST 15 - 41 U/L 22   ALT 0 - 44 U/L 25       Latest Ref Rng & Units 01/27/2023    9:20 AM  CBC  WBC 4.0 - 10.5 K/uL 3.5   Hemoglobin 12.0 - 15.0 g/dL 16.1   Hematocrit 09.6 - 46.0 % 30.3   Platelets 150 - 400 K/uL 219     No images are attached to the encounter.  [place] LIMITED ULTRASOUND INCLUDING AXILLA RIGHT BREAST  Result Date: 01/02/2023 CLINICAL DATA:  The patient is status post 4 cycles of neoadjuvant chemotherapy for right breast 9:30 o'clock cancer. EXAM: ULTRASOUND OF THE RIGHT BREAST COMPARISON:  Previous exam(s). FINDINGS: Targeted right breast ultrasound is performed, showing interval decrease in the known right breast cancer at 9:30 o'clock 5 cm from the nipple. The mass measures 1.2 x 0.7 x 1.3 cm. It previously measured 1.6 x 1.0 x 1.5 cm. IMPRESSION: Interval decrease in the size of known right breast cancer. RECOMMENDATION: Continue with plan of care. I have discussed the findings and recommendations with the patient. If applicable, a reminder letter will be sent to the patient regarding the next appointment. BI-RADS CATEGORY  6: Known biopsy-proven malignancy. Electronically Signed   By: Ted Mcalpine M.D.   On: 01/02/2023 11:08     Assessment and plan- Patient is a 32 y.o. female  with clinically prognostic stage Ib invasive mammary carcinoma of the right breast cT1c CN0CM0 triple negative. She is s/p 4 cycles of neoadjuvant AC chemotherapy.  She is here for on treatment assessment prior to cycle 3 of weekly CarboTaxol chemotherapy neoadjuvant  Counts okay to proceed with cycle 3 of neoadjuvant weekly CarboTaxol chemotherapy today.  ANC is 2 today with a white count of 3.5.  She will be receiving Neupogen for 3 days starting tomorrow.  She will directly proceed for cycle 4 of  weekly CarboTaxol chemotherapy next week and I will see her back in 2 weeks for cycle 5  Chemo-induced anemia: Stable continue to monitor   Visit Diagnosis 1. Malignant neoplasm of upper-outer quadrant of right breast in female, estrogen receptor negative (HCC)   2. Encounter for antineoplastic chemotherapy   3. Chemotherapy induced neutropenia (HCC)      Dr. Owens Shark, MD, MPH [facility] at [facility] [phone] 01/27/2023 10:18 AM

## 2023-01-28 ENCOUNTER — Telehealth: Payer: Self-pay | Admitting: *Deleted

## 2023-01-28 ENCOUNTER — Encounter: Payer: Self-pay | Admitting: Oncology

## 2023-01-28 ENCOUNTER — Inpatient Hospital Stay: Payer: Medicaid Other

## 2023-01-28 DIAGNOSIS — C50411 Malignant neoplasm of upper-outer quadrant of right female breast: Secondary | ICD-10-CM

## 2023-01-28 DIAGNOSIS — Z5111 Encounter for antineoplastic chemotherapy: Secondary | ICD-10-CM | POA: Diagnosis not present

## 2023-01-28 MED ORDER — FILGRASTIM-AAFI 300 MCG/0.5ML IJ SOSY
300.0000 ug | PREFILLED_SYRINGE | Freq: Every day | INTRAMUSCULAR | Status: DC
  Administered 2023-01-28: 300 ug via SUBCUTANEOUS
  Filled 2023-01-28: qty 0.5

## 2023-01-28 NOTE — Telephone Encounter (Signed)
She can see how the next 24 hours go. If her temperature crosses or remains >100.5 she should call us. She does not have to see [place] today if she is doing ok otherwise

## 2023-01-28 NOTE — Telephone Encounter (Signed)
RN returned call to pt who reports she had Carb/Taxol yesterday and returns today for neupogen at 2pm.  Pt reports she had cold sweats last and is having hot sweats today along with a headache.  Pt reports her temperature was 99.8 this morning.  Pt has not taken any medications for symptoms. Wanted to make sure we didn't want to see her this afternoon when she comes in for injection. Please advise.

## 2023-01-29 ENCOUNTER — Inpatient Hospital Stay: Payer: Medicaid Other

## 2023-01-29 DIAGNOSIS — Z171 Estrogen receptor negative status [ER-]: Secondary | ICD-10-CM

## 2023-01-29 DIAGNOSIS — Z5111 Encounter for antineoplastic chemotherapy: Secondary | ICD-10-CM | POA: Diagnosis not present

## 2023-01-29 MED ORDER — FILGRASTIM-SNDZ 300 MCG/0.5ML IJ SOSY
300.0000 ug | PREFILLED_SYRINGE | Freq: Once | INTRAMUSCULAR | Status: AC
  Administered 2023-01-29: 300 ug via SUBCUTANEOUS
  Filled 2023-01-29: qty 0.5

## 2023-01-29 MED ORDER — FILGRASTIM-AAFI 300 MCG/0.5ML IJ SOSY
300.0000 ug | PREFILLED_SYRINGE | Freq: Every day | INTRAMUSCULAR | Status: DC
  Filled 2023-01-29: qty 0.5

## 2023-01-29 NOTE — Progress Notes (Signed)
No Nivestym in stock today; sub'd Zarxio/no PA req'd   Ebony Hail, Pharm.D., CPP 01/29/2023@2 :49 PM

## 2023-01-30 ENCOUNTER — Inpatient Hospital Stay: Payer: Medicaid Other

## 2023-01-31 ENCOUNTER — Inpatient Hospital Stay: Payer: Medicaid Other

## 2023-01-31 ENCOUNTER — Encounter: Payer: Self-pay | Admitting: Oncology

## 2023-01-31 DIAGNOSIS — Z5111 Encounter for antineoplastic chemotherapy: Secondary | ICD-10-CM | POA: Diagnosis not present

## 2023-01-31 DIAGNOSIS — C50411 Malignant neoplasm of upper-outer quadrant of right female breast: Secondary | ICD-10-CM

## 2023-01-31 MED ORDER — FILGRASTIM-AAFI 300 MCG/0.5ML IJ SOSY
300.0000 ug | PREFILLED_SYRINGE | Freq: Every day | INTRAMUSCULAR | Status: DC
  Administered 2023-01-31: 300 ug via SUBCUTANEOUS
  Filled 2023-01-31: qty 0.5

## 2023-01-31 MED FILL — Dexamethasone Sodium Phosphate Inj 100 MG/10ML: INTRAMUSCULAR | Qty: 1 | Status: AC

## 2023-02-03 ENCOUNTER — Inpatient Hospital Stay: Payer: Medicaid Other

## 2023-02-03 ENCOUNTER — Ambulatory Visit: Payer: Medicaid Other

## 2023-02-03 ENCOUNTER — Telehealth: Payer: Self-pay | Admitting: *Deleted

## 2023-02-03 VITALS — BP 99/75 | HR 86 | Temp 98.7°F | Resp 17 | Wt 116.2 lb

## 2023-02-03 DIAGNOSIS — Z171 Estrogen receptor negative status [ER-]: Secondary | ICD-10-CM

## 2023-02-03 DIAGNOSIS — Z5111 Encounter for antineoplastic chemotherapy: Secondary | ICD-10-CM | POA: Diagnosis not present

## 2023-02-03 LAB — CMP (CANCER CENTER ONLY)
ALT: 31 U/L (ref 0–44)
AST: 24 U/L (ref 15–41)
Albumin: 4 g/dL (ref 3.5–5.0)
Alkaline Phosphatase: 57 U/L (ref 38–126)
Anion gap: 9 (ref 5–15)
BUN: 9 mg/dL (ref 6–20)
CO2: 22 mmol/L (ref 22–32)
Calcium: 8.9 mg/dL (ref 8.9–10.3)
Chloride: 105 mmol/L (ref 98–111)
Creatinine: 0.51 mg/dL (ref 0.44–1.00)
GFR, Estimated: >60 mL/min (ref >60)
Glucose, Bld: 103 mg/dL — ABNORMAL HIGH (ref 70–99)
Potassium: 3.8 mmol/L (ref 3.5–5.1)
Sodium: 136 mmol/L (ref 135–145)
Total Bilirubin: 0.5 mg/dL (ref 0.3–1.2)
Total Protein: 6.7 g/dL (ref 6.5–8.1)

## 2023-02-03 LAB — CBC WITH DIFFERENTIAL (CANCER CENTER ONLY)
Abs Immature Granulocytes: 0.13 10*3/uL — ABNORMAL HIGH (ref 0.00–0.07)
Basophils Absolute: 0.1 10*3/uL (ref 0.0–0.1)
Basophils Relative: 1 %
Eosinophils Absolute: 0.1 10*3/uL (ref 0.0–0.5)
Eosinophils Relative: 2 %
HCT: 30.6 % — ABNORMAL LOW (ref 36.0–46.0)
Hemoglobin: 10 g/dL — ABNORMAL LOW (ref 12.0–15.0)
Immature Granulocytes: 3 %
Lymphocytes Relative: 39 %
Lymphs Abs: 1.8 10*3/uL (ref 0.7–4.0)
MCH: 30.1 pg (ref 26.0–34.0)
MCHC: 32.7 g/dL (ref 30.0–36.0)
MCV: 92.2 fL (ref 80.0–100.0)
Monocytes Absolute: 0.6 10*3/uL (ref 0.1–1.0)
Monocytes Relative: 12 %
Neutro Abs: 2.1 10*3/uL (ref 1.7–7.7)
Neutrophils Relative %: 43 %
Platelet Count: 225 10*3/uL (ref 150–400)
RBC: 3.32 MIL/uL — ABNORMAL LOW (ref 3.87–5.11)
RDW: 17.1 % — ABNORMAL HIGH (ref 11.5–15.5)
WBC Count: 4.7 10*3/uL (ref 4.0–10.5)
nRBC: 0 % (ref 0.0–0.2)

## 2023-02-03 LAB — PREGNANCY, URINE: Preg Test, Ur: NEGATIVE

## 2023-02-03 MED ORDER — FAMOTIDINE IN NACL 20-0.9 MG/50ML-% IV SOLN
20.0000 mg | Freq: Once | INTRAVENOUS | Status: AC
  Administered 2023-02-03: 20 mg via INTRAVENOUS
  Filled 2023-02-03: qty 50

## 2023-02-03 MED ORDER — SODIUM CHLORIDE 0.9 % IV SOLN
10.0000 mg | Freq: Once | INTRAVENOUS | Status: AC
  Administered 2023-02-03: 10 mg via INTRAVENOUS
  Filled 2023-02-03: qty 10

## 2023-02-03 MED ORDER — SODIUM CHLORIDE 0.9 % IV SOLN
171.6000 mg | Freq: Once | INTRAVENOUS | Status: AC
  Administered 2023-02-03: 170 mg via INTRAVENOUS
  Filled 2023-02-03: qty 17

## 2023-02-03 MED ORDER — PALONOSETRON HCL INJECTION 0.25 MG/5ML
0.2500 mg | Freq: Once | INTRAVENOUS | Status: AC
  Administered 2023-02-03: 0.25 mg via INTRAVENOUS
  Filled 2023-02-03: qty 5

## 2023-02-03 MED ORDER — SODIUM CHLORIDE 0.9 % IV SOLN
80.0000 mg/m2 | Freq: Once | INTRAVENOUS | Status: AC
  Administered 2023-02-03: 126 mg via INTRAVENOUS
  Filled 2023-02-03: qty 21

## 2023-02-03 MED ORDER — HEPARIN SOD (PORK) LOCK FLUSH 100 UNIT/ML IV SOLN
INTRAVENOUS | Status: AC
  Filled 2023-02-03: qty 5

## 2023-02-03 MED ORDER — DIPHENHYDRAMINE HCL 50 MG/ML IJ SOLN
50.0000 mg | Freq: Once | INTRAMUSCULAR | Status: AC
  Administered 2023-02-03: 50 mg via INTRAVENOUS
  Filled 2023-02-03: qty 1

## 2023-02-03 MED ORDER — SODIUM CHLORIDE 0.9 % IV SOLN
Freq: Once | INTRAVENOUS | Status: AC
  Filled 2023-02-03: qty 250

## 2023-02-03 MED ORDER — SODIUM CHLORIDE 0.9% FLUSH
10.0000 mL | Freq: Once | INTRAVENOUS | Status: AC
  Administered 2023-02-03: 10 mL via INTRAVENOUS
  Filled 2023-02-03: qty 10

## 2023-02-03 MED ORDER — HEPARIN SOD (PORK) LOCK FLUSH 100 UNIT/ML IV SOLN
500.0000 [IU] | Freq: Once | INTRAVENOUS | Status: AC | PRN
  Filled 2023-02-03: qty 5

## 2023-02-03 NOTE — Patient Instructions (Signed)
 [place] [facility]  Discharge Instructions: Thank you for choosing [facility] to provide your oncology and hematology care.  If you have a lab appointment with the Cancer Center, please go directly to the Cancer Center and check in at the registration area.  Wear comfortable clothing and clothing appropriate for easy access to any Portacath or PICC line.   We strive to give you quality time with your provider. You may need to reschedule your appointment if you arrive late (15 or more minutes).  Arriving late affects you and other patients whose appointments are after yours.  Also, if you miss three or more appointments without notifying the office, you may be dismissed from the clinic at the provider's discretion.      For prescription refill requests, have your pharmacy contact our office and allow 72 hours for refills to be completed.    Today you received the following chemotherapy and/or immunotherapy agents Taxol and Carbo   To help prevent nausea and vomiting after your treatment, we encourage you to take your nausea medication as directed.  BELOW ARE SYMPTOMS THAT SHOULD BE REPORTED IMMEDIATELY: *FEVER GREATER THAN 100.4 F (38 C) OR HIGHER *CHILLS OR SWEATING *NAUSEA AND VOMITING THAT IS NOT CONTROLLED WITH YOUR NAUSEA MEDICATION *UNUSUAL SHORTNESS OF BREATH *UNUSUAL BRUISING OR BLEEDING *URINARY PROBLEMS (pain or burning when urinating, or frequent urination) *BOWEL PROBLEMS (unusual diarrhea, constipation, pain near the anus) TENDERNESS IN MOUTH AND THROAT WITH OR WITHOUT PRESENCE OF ULCERS (sore throat, sores in mouth, or a toothache) UNUSUAL RASH, SWELLING OR PAIN  UNUSUAL VAGINAL DISCHARGE OR ITCHING   Items with * indicate a potential emergency and should be followed up as soon as possible or go to the Emergency Department if any problems should occur.  Please show the CHEMOTHERAPY ALERT CARD or IMMUNOTHERAPY ALERT CARD at check-in  to the Emergency Department and triage nurse.  Should you have questions after your visit or need to cancel or reschedule your appointment, please contact [place] [facility]  [phone] and follow the prompts.  Office hours are 8:00 a.m. to 4:30 p.m. Monday - Friday. Please note that voicemails left after 4:00 p.m. may not be returned until the following business day.  We are closed weekends and major holidays. You have access to a nurse at all times for urgent questions. Please call the main number to the clinic [phone] and follow the prompts.  For any non-urgent questions, you may also contact your provider using MyChart. We now offer e-Visits for anyone 14 and older to request care online for non-urgent symptoms. For details visit [email or web address].   Also download the MyChart app! Go to the app store, search "MyChart", open the app, select [place], and log in with your MyChart username and password.

## 2023-02-03 NOTE — Telephone Encounter (Signed)
Pt sent message through chemo  area about if she needs a inj. Because her son is starting school and worried about getting something from school. Dr Smith Robert states we will give daily inj of short acting injection based on the numbers with labs every week. Pt is aware

## 2023-02-05 ENCOUNTER — Encounter: Payer: Self-pay | Admitting: Surgery

## 2023-02-05 ENCOUNTER — Ambulatory Visit: Payer: Medicaid Other | Admitting: Surgery

## 2023-02-05 VITALS — BP 109/75 | HR 88 | Temp 98.3°F | Ht 64.0 in | Wt 114.8 lb

## 2023-02-05 DIAGNOSIS — Z171 Estrogen receptor negative status [ER-]: Secondary | ICD-10-CM

## 2023-02-05 DIAGNOSIS — C50411 Malignant neoplasm of upper-outer quadrant of right female breast: Secondary | ICD-10-CM | POA: Diagnosis not present

## 2023-02-05 NOTE — Patient Instructions (Signed)
If you have any concerns or questions, please feel free to call our office. See follow up appointment.   Breast Cancer, Female  Breast cancer is a malignant growth of tissue (tumor) in the breast. Unlike noncancerous (benign) tumors, malignant tumors are cancerous and can spread to other parts of the body. The two most common types of breast cancer start in the milk ducts (ductal carcinoma) or in the lobules where milk is made in the breast (lobular carcinoma). Breast cancer is one of the most common types of cancer in women. What are the causes? The exact cause of female breast cancer is unknown. What increases the risk? The following factors may make you more likely to develop this condition: Being older than 30 years of age. Having a family history of breast cancer. Starting menopause after age 80. Starting your menstrual periods before age 29. Having never been pregnant or having your first child after age 44. Having never breastfed. A personal history of: Breast cancer. Dense breast tissue. Radiation exposure. Having the BRCA1 and BRCA2 genes. Having certain types of benign breast conditions. Exposure to the drug DES, which was given to pregnant women from the 1940s to the 1970s. Other risks include: Using birth control pills. Using hormone therapy after menopause. Drinking more than one alcoholic drink a day. Obesity. What are the signs or symptoms? Symptoms of this condition include: A painless lump or thickening in your breast. Changes in the size or shape of your breast. Breast skin changes, such as puckering or dimpling. Nipple abnormalities, such as scaling, crustiness, redness, or pulling in (retraction). Nipple discharge that is bloody or clear. How is this diagnosed? This condition may be diagnosed by: Taking your medical history and doing a physical exam. During the exam, your health care provider will feel the tissue around your breast and under your arms. Taking  a sample of nipple discharge. The sample will be examined under a microscope. Performing imaging tests, such as breast X-rays (mammogram), ultrasound, or MRI. Taking a tissue sample (biopsy) from the breast. The sample will be examined under a microscope to look for cancer cells. Taking a sample from the lymph nodes near the affected breast (sentinel node biopsy). Your cancer will be staged to determine its severity and extent. Staging is a careful attempt to find out the size of the tumor, whether the cancer has spread, and if so, to what parts of the body. Staging also includes testing your tumor for certain receptors, such as estrogen, progesterone, and human epidermal growth factor receptor 2 (HER2). This will help your cancer care team decide on a treatment that will work best for you. You may need to have more tests to determine the stage of your cancer. Stages include the following: Stage 0--The tumor has not spread to other breast tissue. Stage 1 (I)--The cancer is only found in the breast or may be in the lymph nodes. The tumor may be up to  inch (2 cm) wide. Stage 2 (II)--The cancer has spread to nearby lymph nodes. The tumor may be up to 2 inches (5 cm) wide. Stage 3 (III)--The cancer has spread to more distant lymph nodes. The tumor may be larger than 2 inches (5 cm) wide. Stage 4 (IV)--The cancer has spread to other parts of the body, such as the bones, brain, liver, or lungs. How is this treated? Treatment for this condition depends on the type and stage of the breast cancer. It may be treated with: Surgery. This may involve  breast-conserving surgery (lumpectomy or partial mastectomy) in which only the part of the breast containing the cancer is removed. Some normal tissue surrounding this area may also be removed. In some cases, surgery may be done to remove the entire breast (mastectomy) and nipple. Lymph nodes may also be removed. Radiation therapy, which uses high-energy rays to kill  cancer cells. Chemotherapy, which is the use of medicines to kill cancer cells. Hormone therapy, which involves taking medicine to adjust the hormone levels in your body. You may take medicine to decrease your estrogen levels. This can help stop cancer cells from growing. Targeted therapy, in which medicines are used to block the growth and spread of cancer cells. These medicines target a specific part of the cancer cell and usually cause fewer side effects than chemotherapy. Targeted therapy may be used alone or in combination with chemotherapy. Immunotherapy, which is the use of medicines to boost the immune system to recognize and destroy cancer cells more effectively. A combination of surgery, radiation, chemotherapy, or hormone therapy may be needed to treat breast cancer. Follow these instructions at home: Take over-the-counter and prescription medicines only as told by your health care provider. Eat a healthy diet. A healthy diet includes lots of fruits and vegetables, low-fat dairy products, lean meats, and fiber. Make sure half your plate is filled with fruits or vegetables. Choose high-fiber foods such as whole-grain breads and cereals. Consider joining a support group. This may help you cope with the stress of having breast cancer. Talk to your health care team about exercise and physical activity. The right exercise program can: Help prevent or reduce symptoms such as fatigue or depression. Improve overall health and survival rates. Keep all follow-up visits. This is important. Where to find more information American Cancer Society: www.cancer.org National Cancer Institute: www.cancer.gov Contact a health care provider if: You have a sudden increase in pain. You have any symptoms or changes that concern you. You lose weight without trying. You notice a new lump in either breast or under your arm. You develop swelling in either arm or hand. You have a fever. You notice new  fatigue or weakness. Get help right away if: You have chest pain or trouble breathing. These symptoms may be an emergency. Get help right away. Call 911. Do not wait to see if the symptoms will go away. Do not drive yourself to the hospital. Summary Breast cancer is a malignant growth of tissue (tumor) in the breast. Your cancer will be staged to determine its severity and extent. Treatment for this condition depends on the type and stage of the breast cancer. This information is not intended to replace advice given to you by your health care provider. Make sure you discuss any questions you have with your health care provider. Document Revised: 04/30/2021 Document Reviewed: 04/30/2021 Elsevier Patient Education  2024 [employer].

## 2023-02-07 NOTE — Progress Notes (Signed)
Outpatient Surgical Follow Up  02/07/2023  Eileen Hart is an 32 y.o. female.   Chief Complaint  Patient presents with   Follow-up    Right breast cancer    HPI:  Eileen Hart is a 32 y.o. female seen in f/u w a diagnosis of triple negative right breast CA T1c N0 M0. Mass on Mammogram  1.6 x 1 x 1.5 cm solid mass in the right breast 9:30 position 5 cm from the nipple.  She did have neoadjuvant with some response.  She did HAve bilateral MRI w additional biopsies. She had 3 additional breast biopsies in the right side and 1 left breast biopsy and all of them were negative for malignancy. PET CT scan showed focal hypermetabolic activity in the right breast adjacent to the biopsy clip but no other additional areas of concern.  She did have a history of breast augmentation with submuscular saline implants.   She Is able to perform more than 4 METS of activity without any shortness of breath or chest pain.  She quit smoking more than 3 years ago but she still uses nicotine products.  Her mother had a history of first-degree cousin with breast cancer that developed around age 63.  No other family members with cancer. Menarche at age 52.  Gravida 2 para 1M1.  She did have breast augmentation surgery right after breast-feeding. She Does take birth control pills She Did have a C-section 9 years ago. SHe is accompanied by her mother  Past Medical History:  Diagnosis Date   Chlamydia 01/25/2010   Depression with anxiety    Gestational hypertension    Immunization, viral disease    gardasil series completed   Tobacco abuse 05/02/2016    Past Surgical History:  Procedure Laterality Date   AUGMENTATION MAMMAPLASTY Bilateral    saline implants 2015   BREAST BIOPSY Right 10/14/2022   US biopsy/ coil clip/ path pending   BREAST BIOPSY Right 10/14/2022   [place] RT BREAST BX W LOC DEV 1ST LESION IMG BX SPEC US GUIDE 10/14/2022 [facility]   BREAST BIOPSY Left 10/29/2022   [place] Core Bx Ribbon  clip- path pending   BREAST BIOPSY Right 10/29/2022   [place] Core 1130 Venus Clip- path pending   BREAST BIOPSY Right 10/29/2022   [place] Core Bx Retroareolar heart clip-path pending   BREAST BIOPSY Right 10/29/2022   [place] Core Bx Ribbon Clip path pending   BREAST BIOPSY Right 10/29/2022   [place] RT BREAST BX W LOC DEV EA ADD LESION IMG BX SPEC US GUIDE 10/29/2022 [facility]   BREAST BIOPSY Right 10/29/2022   [place] RT BREAST BX W LOC DEV 1ST LESION IMG BX SPEC US GUIDE 10/29/2022 [facility]   BREAST BIOPSY Right 10/29/2022   [place] RT BREAST BX W LOC DEV EA ADD LESION IMG BX SPEC US GUIDE 10/29/2022 [facility]   BREAST BIOPSY Left 10/29/2022   [place] LT BREAST BX W LOC DEV 1ST LESION IMG BX SPEC US GUIDE 10/29/2022 [facility]   BREAST ENHANCEMENT SURGERY  2015   CESAREAN SECTION  2014   PORTACATH PLACEMENT N/A 10/24/2022   Procedure: INSERTION PORT-A-CATH;  Surgeon: Leafy Ro, MD;  Location: [facility];  Service: General;  Laterality: N/A;   TONSILLECTOMY      Family History  Problem Relation Age of Onset   Breast cancer Other 35       mat 2nd cousin   Diabetes Neg Hx    Heart disease Neg Hx  Hypertension Neg Hx    Ovarian cancer Neg Hx    Colon cancer Neg Hx     Social History:  reports that she quit smoking about 4 years ago. Her smoking use included cigarettes. She has never used smokeless tobacco. She reports that she does not drink alcohol and does not use drugs.  Allergies: No Known Allergies  Medications reviewed.    ROS Full ROS performed and is otherwise negative other than what is stated in HPI   BP 109/75   Pulse 88   Temp 98.3 F (36.8 C) (Oral)   Ht 5\' 4"  (1.626 m)   Wt 114 lb 12.8 oz (52.1 kg)   SpO2 99%   BMI 19.71 kg/m   Physical Exam NAD alert No breast exam performed today   Assessment/Plan: Laqunda is a 32 year old female with recently diagnosed triple negative Right breast cancer undergoing neoadjuvant.  She is still undecided about definitive  surgical intervention.  Lumpectomy versus mastectomy discussed once again with the patient and the mother.  We are waiting for plastic surgery consultation as well as finalization of chemotherapy.  I agree with Dr. Smith Robert once she is about 3 to 4 weeks out from chemo we can proceed with definitive surgical therapy.  Please note I spent 20 minutes in this encounter including personally reviewing imaging studies, coordinating her care counseling the patient and performing documentation  [facility] Big, MD [facility] General Surgeon

## 2023-02-09 MED FILL — Dexamethasone Sodium Phosphate Inj 100 MG/10ML: INTRAMUSCULAR | Qty: 1 | Status: AC

## 2023-02-10 ENCOUNTER — Inpatient Hospital Stay: Payer: Medicaid Other | Admitting: Oncology

## 2023-02-10 ENCOUNTER — Encounter: Payer: Self-pay | Admitting: Oncology

## 2023-02-10 ENCOUNTER — Inpatient Hospital Stay: Payer: Medicaid Other

## 2023-02-10 ENCOUNTER — Ambulatory Visit: Payer: Medicaid Other | Admitting: Oncology

## 2023-02-10 ENCOUNTER — Other Ambulatory Visit: Payer: Medicaid Other

## 2023-02-10 VITALS — BP 109/78 | HR 98 | Temp 98.7°F | Resp 18 | Ht 64.0 in | Wt 114.7 lb

## 2023-02-10 VITALS — BP 103/72 | HR 92 | Temp 98.8°F | Resp 18

## 2023-02-10 DIAGNOSIS — C50411 Malignant neoplasm of upper-outer quadrant of right female breast: Secondary | ICD-10-CM

## 2023-02-10 DIAGNOSIS — Z171 Estrogen receptor negative status [ER-]: Secondary | ICD-10-CM

## 2023-02-10 DIAGNOSIS — Z5111 Encounter for antineoplastic chemotherapy: Secondary | ICD-10-CM

## 2023-02-10 LAB — CBC WITH DIFFERENTIAL (CANCER CENTER ONLY)
Abs Immature Granulocytes: 0.01 10*3/uL (ref 0.00–0.07)
Basophils Absolute: 0 10*3/uL (ref 0.0–0.1)
Basophils Relative: 1 %
Eosinophils Absolute: 0.1 10*3/uL (ref 0.0–0.5)
Eosinophils Relative: 2 %
HCT: 32.2 % — ABNORMAL LOW (ref 36.0–46.0)
Hemoglobin: 10.7 g/dL — ABNORMAL LOW (ref 12.0–15.0)
Immature Granulocytes: 0 %
Lymphocytes Relative: 43 %
Lymphs Abs: 1.5 10*3/uL (ref 0.7–4.0)
MCH: 30.3 pg (ref 26.0–34.0)
MCHC: 33.2 g/dL (ref 30.0–36.0)
MCV: 91.2 fL (ref 80.0–100.0)
Monocytes Absolute: 0.2 10*3/uL (ref 0.1–1.0)
Monocytes Relative: 6 %
Neutro Abs: 1.7 10*3/uL (ref 1.7–7.7)
Neutrophils Relative %: 48 %
Platelet Count: 246 10*3/uL (ref 150–400)
RBC: 3.53 MIL/uL — ABNORMAL LOW (ref 3.87–5.11)
RDW: 16.6 % — ABNORMAL HIGH (ref 11.5–15.5)
WBC Count: 3.4 10*3/uL — ABNORMAL LOW (ref 4.0–10.5)
nRBC: 0 % (ref 0.0–0.2)

## 2023-02-10 LAB — CMP (CANCER CENTER ONLY)
ALT: 26 U/L (ref 0–44)
AST: 20 U/L (ref 15–41)
Albumin: 4.3 g/dL (ref 3.5–5.0)
Alkaline Phosphatase: 47 U/L (ref 38–126)
Anion gap: 5 (ref 5–15)
BUN: 11 mg/dL (ref 6–20)
CO2: 23 mmol/L (ref 22–32)
Calcium: 9.2 mg/dL (ref 8.9–10.3)
Chloride: 105 mmol/L (ref 98–111)
Creatinine: 0.67 mg/dL (ref 0.44–1.00)
GFR, Estimated: >60 mL/min (ref >60)
Glucose, Bld: 100 mg/dL — ABNORMAL HIGH (ref 70–99)
Potassium: 3.9 mmol/L (ref 3.5–5.1)
Sodium: 133 mmol/L — ABNORMAL LOW (ref 135–145)
Total Bilirubin: 0.6 mg/dL (ref 0.3–1.2)
Total Protein: 7.2 g/dL (ref 6.5–8.1)

## 2023-02-10 LAB — PREGNANCY, URINE: Preg Test, Ur: NEGATIVE

## 2023-02-10 MED ORDER — SODIUM CHLORIDE 0.9 % IV SOLN
Freq: Once | INTRAVENOUS | Status: AC
  Filled 2023-02-10: qty 250

## 2023-02-10 MED ORDER — PALONOSETRON HCL INJECTION 0.25 MG/5ML
0.2500 mg | Freq: Once | INTRAVENOUS | Status: AC
  Administered 2023-02-10: 0.25 mg via INTRAVENOUS
  Filled 2023-02-10: qty 5

## 2023-02-10 MED ORDER — SODIUM CHLORIDE 0.9 % IV SOLN
10.0000 mg | Freq: Once | INTRAVENOUS | Status: AC
  Administered 2023-02-10: 10 mg via INTRAVENOUS
  Filled 2023-02-10: qty 10

## 2023-02-10 MED ORDER — SODIUM CHLORIDE 0.9 % IV SOLN
171.6000 mg | Freq: Once | INTRAVENOUS | Status: AC
  Administered 2023-02-10: 170 mg via INTRAVENOUS
  Filled 2023-02-10: qty 17

## 2023-02-10 MED ORDER — HEPARIN SOD (PORK) LOCK FLUSH 100 UNIT/ML IV SOLN
500.0000 [IU] | Freq: Once | INTRAVENOUS | Status: AC | PRN
  Administered 2023-02-10: 500 [IU]
  Filled 2023-02-10: qty 5

## 2023-02-10 MED ORDER — SODIUM CHLORIDE 0.9 % IV SOLN
80.0000 mg/m2 | Freq: Once | INTRAVENOUS | Status: AC
  Administered 2023-02-10: 126 mg via INTRAVENOUS
  Filled 2023-02-10: qty 21

## 2023-02-10 MED ORDER — FAMOTIDINE IN NACL 20-0.9 MG/50ML-% IV SOLN
20.0000 mg | Freq: Once | INTRAVENOUS | Status: AC
  Administered 2023-02-10: 20 mg via INTRAVENOUS
  Filled 2023-02-10: qty 50

## 2023-02-10 MED ORDER — DIPHENHYDRAMINE HCL 50 MG/ML IJ SOLN
50.0000 mg | Freq: Once | INTRAMUSCULAR | Status: AC
  Administered 2023-02-10: 50 mg via INTRAVENOUS
  Filled 2023-02-10: qty 1

## 2023-02-10 NOTE — Progress Notes (Signed)
Hematology/Oncology Consult note [facility]  Telephone:([phone] [phone]  Patient Care Team: Glori Luis, MD as PCP - General (Family Medicine) Hulen Luster, RN as Oncology Nurse Navigator Creig Hines, MD as Consulting Physician (Oncology)   Name of the patient: Eileen Hart  [id number]  1990-09-05   Date of visit: 02/10/23  Diagnosis- clinical prognostic stage Ib invasive mammary carcinoma of the right breast cT1 cN0 M0 triple negative   Chief complaint/ Reason for visit-on treatment assessment prior to cycle 5 of neoadjuvant weekly CarboTaxol chemotherapy  Heme/Onc history: Patient is a 32 year old female who underwent a bilateral diagnostic mammogram on 10/07/2022 after she felt a palpable area of concern in her right breast. Mammogram showed a 1.6 x 1 x 1.5 cm solid mass in the right breast 9:30 position 5 cm from the nipple. There was also an adjacent 6 x 9 x 2 mm mass in the right breast. No right axillary adenopathy. The dominant mass was biopsied and was consistent with invasive mammary carcinoma grade 3 ER and HER2 negative.    Bilateral breast MRI showed additional areas of concern in the right breast.  2 additional enhancing masses 1 in the 12 o'clock position and 1 in the 4 o'clock position.  2 adjacent prominent right axillary lymph nodes.  2 intermittent masses in the left breast 9 o'clock position and upper outer left breast.  This was followed by a dedicated ultrasound.  She had 3 breast biopsies in the right side and 1 left breast biopsy and all of them were negative for malignancy.  On the ultrasound her right axillary lymph nodes appeared normal and therefore biopsy was not recommended for the same.   PET CT scan showed focal hypermetabolic activity in the right breast adjacent to the biopsy clip but no other additional areas of concern.  Baseline MUGA scan showed normal EF.   Given that additional biopsies were negative  for malignancy and the only biopsy-proven site was 1.6 cm triple negative breast cancer with negative lymph nodes plan was to do [facility] Taxol chemotherapy neoadjuvant without opting for keynote 522 regimen.    Mild decrease in the size of primary breast mass after 4 cycles of AC chemotherapy. Carboplatin added to taxol neoadjuvantly    Interval history-tolerating chemotherapy well without any significant neuropathy.  Denies any specific complaints at this time  ECOG PS- 0 Pain scale- 0   Review of systems- Review of Systems  Constitutional:  Negative for chills, fever, malaise/fatigue and weight loss.  HENT:  Negative for congestion, ear discharge and nosebleeds.   Eyes:  Negative for blurred vision.  Respiratory:  Negative for cough, hemoptysis, sputum production, shortness of breath and wheezing.   Cardiovascular:  Negative for chest pain, palpitations, orthopnea and claudication.  Gastrointestinal:  Negative for abdominal pain, blood in stool, constipation, diarrhea, heartburn, melena, nausea and vomiting.  Genitourinary:  Negative for dysuria, flank pain, frequency, hematuria and urgency.  Musculoskeletal:  Negative for back pain, joint pain and myalgias.  Skin:  Negative for rash.  Neurological:  Negative for dizziness, tingling, focal weakness, seizures, weakness and headaches.  Endo/Heme/Allergies:  Does not bruise/bleed easily.  Psychiatric/Behavioral:  Negative for depression and suicidal ideas. The patient does not have insomnia.       No Known Allergies   Past Medical History:  Diagnosis Date   Chlamydia 01/25/2010   Depression with anxiety    Gestational hypertension    Immunization, viral disease  gardasil series completed   Tobacco abuse 05/02/2016     Past Surgical History:  Procedure Laterality Date   AUGMENTATION MAMMAPLASTY Bilateral    saline implants 2015   BREAST BIOPSY Right 10/14/2022   US biopsy/ coil clip/ path pending   BREAST BIOPSY Right  10/14/2022   [place] RT BREAST BX W LOC DEV 1ST LESION IMG BX SPEC US GUIDE 10/14/2022 [facility]   BREAST BIOPSY Left 10/29/2022   [place] Core Bx Ribbon clip- path pending   BREAST BIOPSY Right 10/29/2022   [place] Core 1130 Venus Clip- path pending   BREAST BIOPSY Right 10/29/2022   [place] Core Bx Retroareolar heart clip-path pending   BREAST BIOPSY Right 10/29/2022   [place] Core Bx Ribbon Clip path pending   BREAST BIOPSY Right 10/29/2022   [place] RT BREAST BX W LOC DEV EA ADD LESION IMG BX SPEC US GUIDE 10/29/2022 [facility]   BREAST BIOPSY Right 10/29/2022   [place] RT BREAST BX W LOC DEV 1ST LESION IMG BX SPEC US GUIDE 10/29/2022 [facility]   BREAST BIOPSY Right 10/29/2022   [place] RT BREAST BX W LOC DEV EA ADD LESION IMG BX SPEC US GUIDE 10/29/2022 [facility]   BREAST BIOPSY Left 10/29/2022   [place] LT BREAST BX W LOC DEV 1ST LESION IMG BX SPEC US GUIDE 10/29/2022 [facility]   BREAST ENHANCEMENT SURGERY  2015   CESAREAN SECTION  2014   PORTACATH PLACEMENT N/A 10/24/2022   Procedure: INSERTION PORT-A-CATH;  Surgeon: Leafy Ro, MD;  Location: [facility];  Service: General;  Laterality: N/A;   TONSILLECTOMY      Social History   Socioeconomic History   Marital status: Single    Spouse name: Not on file   Number of children: 1   Years of education: 15   Highest education level: Not on file  Occupational History   Occupation: Groomer    Comment: [employer]  Tobacco Use   Smoking status: Former    Current packs/day: 0.00    Types: Cigarettes    Quit date: 07/25/2018    Years since quitting: 4.5   Smokeless tobacco: Never   Tobacco comments:    quit 07/2018  Vaping Use   Vaping status: Never Used  Substance and Sexual Activity   Alcohol use: No    Alcohol/week: 0.0 standard drinks of alcohol   Drug use: No   Sexual activity: Yes    Birth control/protection: Pill  Other Topics Concern   Not on file  Social History Narrative   Not on file   Social Determinants of Health    Financial Resource Strain: Not on file  Food Insecurity: No Food Insecurity (10/22/2022)   Hunger Vital Sign    Worried About Running Out of Food in the Last Year: Never true    Ran Out of Food in the Last Year: Never true  Transportation Needs: No Transportation Needs (10/22/2022)   PRAPARE - Administrator, Civil Service (Medical): No    Lack of Transportation (Non-Medical): No  Physical Activity: Not on file  Stress: Not on file  Social Connections: Not on file  Intimate Partner Violence: Not At Risk (10/22/2022)   Humiliation, Afraid, Rape, and Kick questionnaire    Fear of Current or Ex-Partner: No    Emotionally Abused: No    Physically Abused: No    Sexually Abused: No    Family History  Problem Relation Age of Onset   Breast cancer Other 96  mat 2nd cousin   Diabetes Neg Hx    Heart disease Neg Hx    Hypertension Neg Hx    Ovarian cancer Neg Hx    Colon cancer Neg Hx      Current Outpatient Medications:    dexamethasone (DECADRON) 4 MG tablet, Take 2 tablets (8 mg total) by mouth daily for 3 days. Start the day after doxorubicin/cyclophosphamide chemotherapy. Take with food., Disp: 30 tablet, Rfl: 1   lidocaine-prilocaine (EMLA) cream, Apply to affected area once, Disp: 30 g, Rfl: 3   ondansetron (ZOFRAN) 8 MG tablet, Take 1 tab (8 mg) by mouth every 8 hrs as needed for nausea/vomiting. Start third day after doxorubicin/cyclophosphamide chemotherapy., Disp: 30 tablet, Rfl: 1   prochlorperazine (COMPAZINE) 10 MG tablet, Take 1 tablet (10 mg total) by mouth every 6 (six) hours as needed for nausea or vomiting., Disp: 30 tablet, Rfl: 1 No current facility-administered medications for this visit.  Facility-Administered Medications Ordered in Other Visits:    CARBOplatin (PARAPLATIN) 170 mg in sodium chloride 0.9 % 100 mL chemo infusion, 170 mg, Intravenous, Once, Creig Hines, MD, Last Rate: 234 mL/hr at 02/10/23 1243, 170 mg at 02/10/23 1243   heparin  lock flush 100 unit/mL, 500 Units, Intracatheter, Once PRN, Creig Hines, MD  Physical exam:  Vitals:   02/10/23 0940  BP: 109/78  Pulse: 98  Resp: 18  Temp: 98.7 F (37.1 C)  TempSrc: Tympanic  SpO2: 100%  Weight: 114 lb 11.2 oz (52 kg)  Height: 5\' 4"  (1.626 m)   Physical Exam Cardiovascular:     Rate and Rhythm: Normal rate and regular rhythm.     Heart sounds: Normal heart sounds.  Pulmonary:     Effort: Pulmonary effort is normal.     Breath sounds: Normal breath sounds.  Abdominal:     General: Bowel sounds are normal.     Palpations: Abdomen is soft.  Skin:    General: Skin is warm and dry.  Neurological:     Mental Status: She is alert and oriented to person, place, and time.         Latest Ref Rng & Units 02/10/2023    9:20 AM  CMP  Glucose 70 - 99 mg/dL 284   BUN 6 - 20 mg/dL 11   Creatinine 1.32 - 1.00 mg/dL 4.40   Sodium 102 - 725 mmol/L 133   Potassium 3.5 - 5.1 mmol/L 3.9   Chloride 98 - 111 mmol/L 105   CO2 22 - 32 mmol/L 23   Calcium 8.9 - 10.3 mg/dL 9.2   Total Protein 6.5 - 8.1 g/dL 7.2   Total Bilirubin 0.3 - 1.2 mg/dL 0.6   Alkaline Phos 38 - 126 U/L 47   AST 15 - 41 U/L 20   ALT 0 - 44 U/L 26       Latest Ref Rng & Units 02/10/2023    9:20 AM  CBC  WBC 4.0 - 10.5 K/uL 3.4   Hemoglobin 12.0 - 15.0 g/dL 36.6   Hematocrit 44.0 - 46.0 % 32.2   Platelets 150 - 400 K/uL 246      Assessment and plan- Patient is a 32 y.o. female  with clinically prognostic stage Ib invasive mammary carcinoma of the right breast cT1c CN0CM0 triple negative. She is s/p 4 cycles of neoadjuvant AC chemotherapy.  She is here for on treatment assessment prior to cycle 5 of weekly CarboTaxol chemotherapy  Counts okay to proceed with cycle 5  of neoadjuvant weekly CarboTaxol chemotherapy today.  She will directly proceed for cycle 6 next week and I will see her back in 2 weeks for cycle 7.  After she completes 12 weekly cycles of CarboTaxol chemotherapy she will  proceed with surgery.  She is leaning towards a bilateral mastectomy with reconstruction.  I will defer any decisions for presurgery mammogram or MRI to Dr. Everlene Farrier at this time  Chemo-induced anemia: Stable continue to monitor   Visit Diagnosis 1. Malignant neoplasm of upper-outer quadrant of right breast in female, estrogen receptor negative (HCC)   2. Encounter for antineoplastic chemotherapy      Dr. Owens Shark, MD, MPH [facility] [phone] 02/10/2023 12:56 PM

## 2023-02-10 NOTE — Patient Instructions (Signed)
[facility] [place] [facility]  Discharge Instructions: Thank you for choosing [facility] to provide your oncology and hematology care.  If you have a lab appointment with the Cancer Center, please go directly to the Cancer Center and check in at the registration area.  Wear comfortable clothing and clothing appropriate for easy access to any Portacath or PICC line.   We strive to give you quality time with your provider. You may need to reschedule your appointment if you arrive late (15 or more minutes).  Arriving late affects you and other patients whose appointments are after yours.  Also, if you miss three or more appointments without notifying the office, you may be dismissed from the clinic at the provider's discretion.      For prescription refill requests, have your pharmacy contact our office and allow 72 hours for refills to be completed.    Today you received the following chemotherapy and/or immunotherapy agents Taxol and Carboplatin        To help prevent nausea and vomiting after your treatment, we encourage you to take your nausea medication as directed.  BELOW ARE SYMPTOMS THAT SHOULD BE REPORTED IMMEDIATELY: *FEVER GREATER THAN 100.4 F (38 C) OR HIGHER *CHILLS OR SWEATING *NAUSEA AND VOMITING THAT IS NOT CONTROLLED WITH YOUR NAUSEA MEDICATION *UNUSUAL SHORTNESS OF BREATH *UNUSUAL BRUISING OR BLEEDING *URINARY PROBLEMS (pain or burning when urinating, or frequent urination) *BOWEL PROBLEMS (unusual diarrhea, constipation, pain near the anus) TENDERNESS IN MOUTH AND THROAT WITH OR WITHOUT PRESENCE OF ULCERS (sore throat, sores in mouth, or a toothache) UNUSUAL RASH, SWELLING OR PAIN  UNUSUAL VAGINAL DISCHARGE OR ITCHING   Items with * indicate a potential emergency and should be followed up as soon as possible or go to the Emergency Department if any problems should occur.  Please show the CHEMOTHERAPY ALERT CARD or IMMUNOTHERAPY ALERT CARD  at check-in to the Emergency Department and triage nurse.  Should you have questions after your visit or need to cancel or reschedule your appointment, please contact [facility] [place] [facility]  [phone] and follow the prompts.  Office hours are 8:00 a.m. to 4:30 p.m. Monday - Friday. Please note that voicemails left after 4:00 p.m. may not be returned until the following business day.  We are closed weekends and major holidays. You have access to a nurse at all times for urgent questions. Please call the main number to the clinic [phone] and follow the prompts.  For any non-urgent questions, you may also contact your provider using MyChart. We now offer e-Visits for anyone 18 and older to request care online for non-urgent symptoms. For details visit [email or web address].   Also download the MyChart app! Go to the app store, search "MyChart", open the app, select [facility], and log in with your MyChart username and password.    

## 2023-02-12 ENCOUNTER — Encounter: Payer: Self-pay | Admitting: Oncology

## 2023-02-14 MED FILL — Dexamethasone Sodium Phosphate Inj 100 MG/10ML: INTRAMUSCULAR | Qty: 1 | Status: AC

## 2023-02-17 ENCOUNTER — Inpatient Hospital Stay: Payer: Medicaid Other

## 2023-02-17 VITALS — BP 101/64 | HR 89 | Temp 97.9°F | Resp 16 | Ht 64.0 in | Wt 114.4 lb

## 2023-02-17 DIAGNOSIS — Z5111 Encounter for antineoplastic chemotherapy: Secondary | ICD-10-CM | POA: Diagnosis not present

## 2023-02-17 DIAGNOSIS — C50411 Malignant neoplasm of upper-outer quadrant of right female breast: Secondary | ICD-10-CM

## 2023-02-17 LAB — CMP (CANCER CENTER ONLY)
ALT: 26 U/L (ref 0–44)
AST: 20 U/L (ref 15–41)
Albumin: 4.2 g/dL (ref 3.5–5.0)
Alkaline Phosphatase: 42 U/L (ref 38–126)
Anion gap: 3 — ABNORMAL LOW (ref 5–15)
BUN: 13 mg/dL (ref 6–20)
CO2: 24 mmol/L (ref 22–32)
Calcium: 9.1 mg/dL (ref 8.9–10.3)
Chloride: 109 mmol/L (ref 98–111)
Creatinine: 0.5 mg/dL (ref 0.44–1.00)
GFR, Estimated: >60 mL/min (ref >60)
Glucose, Bld: 104 mg/dL — ABNORMAL HIGH (ref 70–99)
Potassium: 3.8 mmol/L (ref 3.5–5.1)
Sodium: 136 mmol/L (ref 135–145)
Total Bilirubin: 0.3 mg/dL (ref 0.3–1.2)
Total Protein: 6.8 g/dL (ref 6.5–8.1)

## 2023-02-17 LAB — CBC WITH DIFFERENTIAL (CANCER CENTER ONLY)
Abs Immature Granulocytes: 0.01 10*3/uL (ref 0.00–0.07)
Basophils Absolute: 0 10*3/uL (ref 0.0–0.1)
Basophils Relative: 1 %
Eosinophils Absolute: 0 10*3/uL (ref 0.0–0.5)
Eosinophils Relative: 1 %
HCT: 29.4 % — ABNORMAL LOW (ref 36.0–46.0)
Hemoglobin: 9.7 g/dL — ABNORMAL LOW (ref 12.0–15.0)
Immature Granulocytes: 0 %
Lymphocytes Relative: 41 %
Lymphs Abs: 1.2 10*3/uL (ref 0.7–4.0)
MCH: 30.8 pg (ref 26.0–34.0)
MCHC: 33 g/dL (ref 30.0–36.0)
MCV: 93.3 fL (ref 80.0–100.0)
Monocytes Absolute: 0.3 10*3/uL (ref 0.1–1.0)
Monocytes Relative: 9 %
Neutro Abs: 1.4 10*3/uL — ABNORMAL LOW (ref 1.7–7.7)
Neutrophils Relative %: 48 %
Platelet Count: 207 10*3/uL (ref 150–400)
RBC: 3.15 MIL/uL — ABNORMAL LOW (ref 3.87–5.11)
RDW: 16.5 % — ABNORMAL HIGH (ref 11.5–15.5)
WBC Count: 3 10*3/uL — ABNORMAL LOW (ref 4.0–10.5)
nRBC: 0 % (ref 0.0–0.2)

## 2023-02-17 MED ORDER — SODIUM CHLORIDE 0.9 % IV SOLN
Freq: Once | INTRAVENOUS | Status: AC
  Filled 2023-02-17: qty 250

## 2023-02-17 MED ORDER — PALONOSETRON HCL INJECTION 0.25 MG/5ML
0.2500 mg | Freq: Once | INTRAVENOUS | Status: AC
  Administered 2023-02-17: 0.25 mg via INTRAVENOUS
  Filled 2023-02-17: qty 5

## 2023-02-17 MED ORDER — SODIUM CHLORIDE 0.9 % IV SOLN
171.6000 mg | Freq: Once | INTRAVENOUS | Status: AC
  Administered 2023-02-17: 170 mg via INTRAVENOUS
  Filled 2023-02-17: qty 17

## 2023-02-17 MED ORDER — DIPHENHYDRAMINE HCL 50 MG/ML IJ SOLN
50.0000 mg | Freq: Once | INTRAMUSCULAR | Status: AC
  Administered 2023-02-17: 50 mg via INTRAVENOUS
  Filled 2023-02-17: qty 1

## 2023-02-17 MED ORDER — HEPARIN SOD (PORK) LOCK FLUSH 100 UNIT/ML IV SOLN
500.0000 [IU] | Freq: Once | INTRAVENOUS | Status: DC | PRN
  Filled 2023-02-17: qty 5

## 2023-02-17 MED ORDER — SODIUM CHLORIDE 0.9 % IV SOLN
10.0000 mg | Freq: Once | INTRAVENOUS | Status: AC
  Administered 2023-02-17: 10 mg via INTRAVENOUS
  Filled 2023-02-17: qty 10

## 2023-02-17 MED ORDER — FAMOTIDINE IN NACL 20-0.9 MG/50ML-% IV SOLN
20.0000 mg | Freq: Once | INTRAVENOUS | Status: AC
  Administered 2023-02-17: 20 mg via INTRAVENOUS
  Filled 2023-02-17: qty 50

## 2023-02-17 MED ORDER — SODIUM CHLORIDE 0.9 % IV SOLN
80.0000 mg/m2 | Freq: Once | INTRAVENOUS | Status: AC
  Administered 2023-02-17: 126 mg via INTRAVENOUS
  Filled 2023-02-17: qty 21

## 2023-02-17 NOTE — Patient Instructions (Signed)
[facility]  Discharge Instructions: Thank you for choosing [facility] to provide your oncology and hematology care.  If you have a lab appointment with the Cancer Center, please go directly to the Cancer Center and check in at the registration area.  Wear comfortable clothing and clothing appropriate for easy access to any Portacath or PICC line.   We strive to give you quality time with your provider. You may need to reschedule your appointment if you arrive late (15 or more minutes).  Arriving late affects you and other patients whose appointments are after yours.  Also, if you miss three or more appointments without notifying the office, you may be dismissed from the clinic at the provider's discretion.      For prescription refill requests, have your pharmacy contact our office and allow 72 hours for refills to be completed.    Today you received the following chemotherapy and/or immunotherapy agents Taxol & Carboplatin    To help prevent nausea and vomiting after your treatment, we encourage you to take your nausea medication as directed.  BELOW ARE SYMPTOMS THAT SHOULD BE REPORTED IMMEDIATELY: *FEVER GREATER THAN 100.4 F (38 C) OR HIGHER *CHILLS OR SWEATING *NAUSEA AND VOMITING THAT IS NOT CONTROLLED WITH YOUR NAUSEA MEDICATION *UNUSUAL SHORTNESS OF BREATH *UNUSUAL BRUISING OR BLEEDING *URINARY PROBLEMS (pain or burning when urinating, or frequent urination) *BOWEL PROBLEMS (unusual diarrhea, constipation, pain near the anus) TENDERNESS IN MOUTH AND THROAT WITH OR WITHOUT PRESENCE OF ULCERS (sore throat, sores in mouth, or a toothache) UNUSUAL RASH, SWELLING OR PAIN  UNUSUAL VAGINAL DISCHARGE OR ITCHING   Items with * indicate a potential emergency and should be followed up as soon as possible or go to the Emergency Department if any problems should occur.  Please show the CHEMOTHERAPY ALERT CARD or IMMUNOTHERAPY ALERT CARD at  check-in to the Emergency Department and triage nurse.  Should you have questions after your visit or need to cancel or reschedule your appointment, please contact [facility]  [phone] and follow the prompts.  Office hours are 8:00 a.m. to 4:30 p.m. Monday - Friday. Please note that voicemails left after 4:00 p.m. may not be returned until the following business day.  We are closed weekends and major holidays. You have access to a nurse at all times for urgent questions. Please call the main number to the clinic [phone] and follow the prompts.  For any non-urgent questions, you may also contact your provider using MyChart. We now offer e-Visits for anyone 66 and older to request care online for non-urgent symptoms. For details visit [email or web address].   Also download the MyChart app! Go to the app store, search "MyChart", open the app, select [facility], and log in with your MyChart username and password.

## 2023-02-18 ENCOUNTER — Encounter: Payer: Self-pay | Admitting: Plastic Surgery

## 2023-02-18 ENCOUNTER — Inpatient Hospital Stay: Payer: Medicaid Other

## 2023-02-18 ENCOUNTER — Ambulatory Visit (INDEPENDENT_AMBULATORY_CARE_PROVIDER_SITE_OTHER): Payer: Medicaid Other | Admitting: Plastic Surgery

## 2023-02-18 VITALS — BP 116/79 | HR 80 | Ht 64.0 in | Wt 115.0 lb

## 2023-02-18 DIAGNOSIS — C50411 Malignant neoplasm of upper-outer quadrant of right female breast: Secondary | ICD-10-CM

## 2023-02-18 DIAGNOSIS — N6310 Unspecified lump in the right breast, unspecified quadrant: Secondary | ICD-10-CM

## 2023-02-18 DIAGNOSIS — Z5111 Encounter for antineoplastic chemotherapy: Secondary | ICD-10-CM | POA: Diagnosis not present

## 2023-02-18 DIAGNOSIS — C50919 Malignant neoplasm of unspecified site of unspecified female breast: Secondary | ICD-10-CM

## 2023-02-18 DIAGNOSIS — Z1379 Encounter for other screening for genetic and chromosomal anomalies: Secondary | ICD-10-CM

## 2023-02-18 DIAGNOSIS — Z171 Estrogen receptor negative status [ER-]: Secondary | ICD-10-CM | POA: Diagnosis not present

## 2023-02-18 MED ORDER — FILGRASTIM-AAFI 300 MCG/0.5ML IJ SOSY
300.0000 ug | PREFILLED_SYRINGE | Freq: Every day | INTRAMUSCULAR | Status: DC
  Administered 2023-02-18: 300 ug via SUBCUTANEOUS
  Filled 2023-02-18: qty 0.5

## 2023-02-18 NOTE — Progress Notes (Signed)
Patient ID: Eileen Hart, female    DOB: Sep 23, 1990, 32 y.o.   MRN: [id number]   Chief Complaint  Patient presents with  . Breast Cancer  . Breast Problem    The patient is a 32 year old female here for breast reconstruction consultation.  She had a diagnostic mammogram in April after she felt a mass on her right breast.  The mammogram showed a 1.6 x 1 x 1.5 cm mass in the right breast at the 9 o'clock position 5 cm from the nipple.  There was another lesion that was 6 x 9 x 2 mm adjacent to the previous lesion.  The dominant mass was biopsied and consistent with invasive mammary carcinoma grade 3 estrogen and HER2 negative.  The MRI shows areas of concern in the right breast with 2 additional enhancing masses.  One is at the 12 o'clock position and 1 is at the 4 o'clock position.  The MRI also showed 2 adjacent prominent right axillary lymph nodes.  There were 2 masses seen in the left breast which was negative for carcinoma.  She has started chemotherapy and is getting a good response.  She does have a family history of breast cancer.  She had a breast augmentation done in 2015.  She has also had a Port-A-Cath placed, C-section and tonsillectomy.  She is 5 feet 4 inches tall and weighs 115 pounds.  Her current implants are saline and are 350 cc implants.  The patient is trying to decide between a right mastectomy versus bilateral mastectomies.    Review of Systems  Constitutional: Negative.   HENT: Negative.    Eyes: Negative.   Respiratory: Negative.    Cardiovascular: Negative.   Gastrointestinal: Negative.   Endocrine: Negative.   Genitourinary: Negative.   Musculoskeletal: Negative.   Neurological: Negative.     Past Medical History:  Diagnosis Date  . Chlamydia 01/25/2010  . Depression with anxiety   . Gestational hypertension   . Immunization, viral disease    gardasil series completed  . Tobacco abuse 05/02/2016    Past Surgical History:  Procedure Laterality Date   . AUGMENTATION MAMMAPLASTY Bilateral    saline implants 2015  . BREAST BIOPSY Right 10/14/2022   US biopsy/ coil clip/ path pending  . BREAST BIOPSY Right 10/14/2022   [place] RT BREAST BX W LOC DEV 1ST LESION IMG BX SPEC US GUIDE 10/14/2022 [facility]  . BREAST BIOPSY Left 10/29/2022   [place] Core Bx Ribbon clip- path pending  . BREAST BIOPSY Right 10/29/2022   [place] Core 1130 Venus Clip- path pending  . BREAST BIOPSY Right 10/29/2022   [place] Core Bx Retroareolar heart clip-path pending  . BREAST BIOPSY Right 10/29/2022   [place] Core Bx Ribbon Clip path pending  . BREAST BIOPSY Right 10/29/2022   [place] RT BREAST BX W LOC DEV EA ADD LESION IMG BX SPEC US GUIDE 10/29/2022 [facility]  . BREAST BIOPSY Right 10/29/2022   [place] RT BREAST BX W LOC DEV 1ST LESION IMG BX SPEC US GUIDE 10/29/2022 [facility]  . BREAST BIOPSY Right 10/29/2022   [place] RT BREAST BX W LOC DEV EA ADD LESION IMG BX SPEC US GUIDE 10/29/2022 [facility]  . BREAST BIOPSY Left 10/29/2022   [place] LT BREAST BX W LOC DEV 1ST LESION IMG BX SPEC US GUIDE 10/29/2022 [facility]  . BREAST ENHANCEMENT SURGERY  2015  . CESAREAN SECTION  2014  . PORTACATH PLACEMENT N/A 10/24/2022   Procedure: INSERTION PORT-A-CATH;  Surgeon: Sterling Big  F, MD;  Location: [facility];  Service: General;  Laterality: N/A;  . TONSILLECTOMY        Current Outpatient Medications:  .  dexamethasone (DECADRON) 4 MG tablet, Take 2 tablets (8 mg total) by mouth daily for 3 days. Start the day after doxorubicin/cyclophosphamide chemotherapy. Take with food., Disp: 30 tablet, Rfl: 1 .  lidocaine-prilocaine (EMLA) cream, Apply to affected area once, Disp: 30 g, Rfl: 3 .  ondansetron (ZOFRAN) 8 MG tablet, Take 1 tab (8 mg) by mouth every 8 hrs as needed for nausea/vomiting. Start third day after doxorubicin/cyclophosphamide chemotherapy., Disp: 30 tablet, Rfl: 1 .  prochlorperazine (COMPAZINE) 10 MG tablet, Take 1 tablet (10 mg total) by mouth every 6 (six) hours as needed for  nausea or vomiting., Disp: 30 tablet, Rfl: 1   Objective:   Vitals:   02/18/23 0836  BP: 116/79  Pulse: 80  SpO2: 98%    Physical Exam Vitals and nursing note reviewed.  Constitutional:      Appearance: Normal appearance.  HENT:     Head: Normocephalic and atraumatic.  Cardiovascular:     Rate and Rhythm: Normal rate.     Pulses: Normal pulses.  Pulmonary:     Effort: Pulmonary effort is normal.  Abdominal:     General: There is no distension.     Palpations: Abdomen is soft.  Musculoskeletal:        General: No swelling, tenderness or deformity.  Skin:    General: Skin is warm.     Capillary Refill: Capillary refill takes less than 2 seconds.     Coloration: Skin is not jaundiced.  Neurological:     Mental Status: She is alert and oriented to person, place, and time.  Psychiatric:        Mood and Affect: Mood normal.        Behavior: Behavior normal.        Thought Content: Thought content normal.        Judgment: Judgment normal.    Assessment & Plan:  Invasive carcinoma of breast (HCC)  Malignant neoplasm of upper-outer quadrant of right breast in female, estrogen receptor negative (HCC)  Genetic testing  Mass of right breast, unspecified quadrant  The options for reconstruction we explained to the patient / family for breast reconstruction.  There are two general categories of reconstruction.  We can reconstruction a breast with implants or use the patient's own tissue.  These were further discussed as listed.  Breast reconstruction is an optional procedure and eligibility depends on the full spectrum of the health of the patient and any co-morbidities.  More than one surgery is often needed to complete the reconstruction process.  The process can take three to twelve months to complete.  The breasts will not be identical due to many factors such as rib differences, shoulder asymmetry and treatments such as radiation.  The goal is to get the breasts to look  normal and symmetrical in clothes.  Scars are a part of surgery and may fade some in time but will always be present under clothes.  Surgery may be an option on the non-cancer breast to achieve more symmetry.  No matter which procedure is chosen there is always the risk of complications and even failure of the body to heal.  This could result in no breast.    The options for reconstruction include:  1. Placement of a tissue expander with Acellular dermal matrix. When the expander is the desired size surgery  is performed to remove the expander and place an implant.  In some cases the implant can be placed without an expander.  2. Autologous reconstruction can include using a muscle or tissue from another area of the body to create a breast.  3. Combined procedures (ie. latissismus dorsi flap) can be done with an expander / implant placed under the muscle.   The risks, benefits, scars and recovery time were discussed for each of the above. Risks include bleeding, infection, hematoma, seroma, scarring, pain, wound healing complications, flap loss, fat necrosis, capsular contracture, need for implant removal, donor site complications, bulge, hernia, umbilical necrosis, need for urgent reoperation, and need for dressing changes.   The procedure the patient selected / that was best for the patient, was then discussed in further detail.  Total time: 45 minutes. This includes time spent with the patient during the visit as well as time spent before and after the visit reviewing the chart, documenting the encounter, making phone calls and reviewing studies.   The patient is trying to decide between a partial mastectomy on the right versus bilateral mastectomies.  At this time she is leaning towards a partial mastectomy on the right with silicone implant replacement.  We are going to talk in 1 week and that will give her time to think it through a little bit longer.  Pictures were obtained of the patient and  placed in the chart with the patient's or guardian's permission.   Alena Bills Lind Ausley, DO

## 2023-02-19 ENCOUNTER — Telehealth: Payer: Self-pay

## 2023-02-19 ENCOUNTER — Inpatient Hospital Stay: Payer: Medicaid Other

## 2023-02-19 DIAGNOSIS — Z171 Estrogen receptor negative status [ER-]: Secondary | ICD-10-CM

## 2023-02-19 DIAGNOSIS — Z5111 Encounter for antineoplastic chemotherapy: Secondary | ICD-10-CM | POA: Diagnosis not present

## 2023-02-19 MED ORDER — FILGRASTIM-AAFI 300 MCG/0.5ML IJ SOSY
300.0000 ug | PREFILLED_SYRINGE | Freq: Every day | INTRAMUSCULAR | Status: DC
  Administered 2023-02-19: 300 ug via SUBCUTANEOUS
  Filled 2023-02-19: qty 0.5

## 2023-02-19 NOTE — Telephone Encounter (Signed)
error 

## 2023-02-20 ENCOUNTER — Inpatient Hospital Stay: Payer: Medicaid Other

## 2023-02-20 DIAGNOSIS — Z5111 Encounter for antineoplastic chemotherapy: Secondary | ICD-10-CM | POA: Diagnosis not present

## 2023-02-20 DIAGNOSIS — Z171 Estrogen receptor negative status [ER-]: Secondary | ICD-10-CM

## 2023-02-20 MED ORDER — FILGRASTIM-AAFI 300 MCG/0.5ML IJ SOSY
300.0000 ug | PREFILLED_SYRINGE | Freq: Every day | INTRAMUSCULAR | Status: DC
  Administered 2023-02-20: 300 ug via SUBCUTANEOUS
  Filled 2023-02-20: qty 0.5

## 2023-02-24 ENCOUNTER — Other Ambulatory Visit: Payer: Self-pay | Admitting: *Deleted

## 2023-02-24 DIAGNOSIS — Z171 Estrogen receptor negative status [ER-]: Secondary | ICD-10-CM

## 2023-02-25 ENCOUNTER — Inpatient Hospital Stay: Payer: Medicaid Other | Attending: Oncology

## 2023-02-25 ENCOUNTER — Inpatient Hospital Stay: Payer: Medicaid Other

## 2023-02-25 ENCOUNTER — Inpatient Hospital Stay (HOSPITAL_BASED_OUTPATIENT_CLINIC_OR_DEPARTMENT_OTHER): Payer: Medicaid Other | Admitting: Oncology

## 2023-02-25 ENCOUNTER — Encounter: Payer: Self-pay | Admitting: Oncology

## 2023-02-25 VITALS — BP 120/62 | HR 64 | Temp 99.0°F | Resp 18 | Ht 64.0 in | Wt 117.0 lb

## 2023-02-25 DIAGNOSIS — R7989 Other specified abnormal findings of blood chemistry: Secondary | ICD-10-CM | POA: Diagnosis not present

## 2023-02-25 DIAGNOSIS — D6481 Anemia due to antineoplastic chemotherapy: Secondary | ICD-10-CM

## 2023-02-25 DIAGNOSIS — Z171 Estrogen receptor negative status [ER-]: Secondary | ICD-10-CM

## 2023-02-25 DIAGNOSIS — Z5111 Encounter for antineoplastic chemotherapy: Secondary | ICD-10-CM | POA: Diagnosis not present

## 2023-02-25 DIAGNOSIS — T451X5A Adverse effect of antineoplastic and immunosuppressive drugs, initial encounter: Secondary | ICD-10-CM | POA: Diagnosis not present

## 2023-02-25 DIAGNOSIS — D701 Agranulocytosis secondary to cancer chemotherapy: Secondary | ICD-10-CM | POA: Insufficient documentation

## 2023-02-25 DIAGNOSIS — Z79899 Other long term (current) drug therapy: Secondary | ICD-10-CM | POA: Diagnosis not present

## 2023-02-25 DIAGNOSIS — Z87891 Personal history of nicotine dependence: Secondary | ICD-10-CM | POA: Insufficient documentation

## 2023-02-25 DIAGNOSIS — C50411 Malignant neoplasm of upper-outer quadrant of right female breast: Secondary | ICD-10-CM | POA: Diagnosis not present

## 2023-02-25 DIAGNOSIS — G620 Drug-induced polyneuropathy: Secondary | ICD-10-CM | POA: Diagnosis not present

## 2023-02-25 DIAGNOSIS — Z803 Family history of malignant neoplasm of breast: Secondary | ICD-10-CM | POA: Diagnosis not present

## 2023-02-25 DIAGNOSIS — Z5189 Encounter for other specified aftercare: Secondary | ICD-10-CM | POA: Insufficient documentation

## 2023-02-25 LAB — CBC WITH DIFFERENTIAL (CANCER CENTER ONLY)
Abs Immature Granulocytes: 0.06 10*3/uL (ref 0.00–0.07)
Basophils Absolute: 0 10*3/uL (ref 0.0–0.1)
Basophils Relative: 1 %
Eosinophils Absolute: 0.1 10*3/uL (ref 0.0–0.5)
Eosinophils Relative: 4 %
HCT: 32.2 % — ABNORMAL LOW (ref 36.0–46.0)
Hemoglobin: 10.6 g/dL — ABNORMAL LOW (ref 12.0–15.0)
Immature Granulocytes: 2 %
Lymphocytes Relative: 37 %
Lymphs Abs: 1.4 10*3/uL (ref 0.7–4.0)
MCH: 31.5 pg (ref 26.0–34.0)
MCHC: 32.9 g/dL (ref 30.0–36.0)
MCV: 95.5 fL (ref 80.0–100.0)
Monocytes Absolute: 0.5 10*3/uL (ref 0.1–1.0)
Monocytes Relative: 13 %
Neutro Abs: 1.7 10*3/uL (ref 1.7–7.7)
Neutrophils Relative %: 43 %
Platelet Count: 198 10*3/uL (ref 150–400)
RBC: 3.37 MIL/uL — ABNORMAL LOW (ref 3.87–5.11)
RDW: 16.7 % — ABNORMAL HIGH (ref 11.5–15.5)
WBC Count: 3.9 10*3/uL — ABNORMAL LOW (ref 4.0–10.5)
nRBC: 0 % (ref 0.0–0.2)

## 2023-02-25 LAB — CMP (CANCER CENTER ONLY)
ALT: 38 U/L (ref 0–44)
AST: 30 U/L (ref 15–41)
Albumin: 4.1 g/dL (ref 3.5–5.0)
Alkaline Phosphatase: 48 U/L (ref 38–126)
Anion gap: 10 (ref 5–15)
BUN: 10 mg/dL (ref 6–20)
CO2: 22 mmol/L (ref 22–32)
Calcium: 9 mg/dL (ref 8.9–10.3)
Chloride: 103 mmol/L (ref 98–111)
Creatinine: 0.56 mg/dL (ref 0.44–1.00)
GFR, Estimated: >60 mL/min
Glucose, Bld: 122 mg/dL — ABNORMAL HIGH (ref 70–99)
Potassium: 3.7 mmol/L (ref 3.5–5.1)
Sodium: 135 mmol/L (ref 135–145)
Total Bilirubin: 0.4 mg/dL (ref 0.3–1.2)
Total Protein: 6.6 g/dL (ref 6.5–8.1)

## 2023-02-25 LAB — PREGNANCY, URINE: Preg Test, Ur: NEGATIVE

## 2023-02-25 MED ORDER — SODIUM CHLORIDE 0.9 % IV SOLN
80.0000 mg/m2 | Freq: Once | INTRAVENOUS | Status: AC
  Administered 2023-02-25: 126 mg via INTRAVENOUS
  Filled 2023-02-25: qty 21

## 2023-02-25 MED ORDER — SODIUM CHLORIDE 0.9 % IV SOLN
10.0000 mg | Freq: Once | INTRAVENOUS | Status: AC
  Administered 2023-02-25: 10 mg via INTRAVENOUS
  Filled 2023-02-25: qty 10

## 2023-02-25 MED ORDER — HEPARIN SOD (PORK) LOCK FLUSH 100 UNIT/ML IV SOLN
500.0000 [IU] | Freq: Once | INTRAVENOUS | Status: AC | PRN
  Administered 2023-02-25: 500 [IU]
  Filled 2023-02-25: qty 5

## 2023-02-25 MED ORDER — SODIUM CHLORIDE 0.9 % IV SOLN
171.6000 mg | Freq: Once | INTRAVENOUS | Status: AC
  Administered 2023-02-25: 170 mg via INTRAVENOUS
  Filled 2023-02-25: qty 1.42

## 2023-02-25 MED ORDER — PALONOSETRON HCL INJECTION 0.25 MG/5ML
0.2500 mg | Freq: Once | INTRAVENOUS | Status: AC
  Administered 2023-02-25: 0.25 mg via INTRAVENOUS
  Filled 2023-02-25: qty 5

## 2023-02-25 MED ORDER — FAMOTIDINE IN NACL 20-0.9 MG/50ML-% IV SOLN
20.0000 mg | Freq: Once | INTRAVENOUS | Status: AC
  Administered 2023-02-25: 20 mg via INTRAVENOUS
  Filled 2023-02-25: qty 50

## 2023-02-25 MED ORDER — DIPHENHYDRAMINE HCL 50 MG/ML IJ SOLN
50.0000 mg | Freq: Once | INTRAMUSCULAR | Status: AC
  Administered 2023-02-25: 50 mg via INTRAVENOUS
  Filled 2023-02-25: qty 1

## 2023-02-25 MED ORDER — SODIUM CHLORIDE 0.9 % IV SOLN
Freq: Once | INTRAVENOUS | Status: AC
  Filled 2023-02-25: qty 250

## 2023-02-25 NOTE — Patient Instructions (Signed)
[facility] [place] [facility]  Discharge Instructions: Thank you for choosing [facility] to provide your oncology and hematology care.  If you have a lab appointment with the Cancer Center, please go directly to the Cancer Center and check in at the registration area.  Wear comfortable clothing and clothing appropriate for easy access to any Portacath or PICC line.   We strive to give you quality time with your provider. You may need to reschedule your appointment if you arrive late (15 or more minutes).  Arriving late affects you and other patients whose appointments are after yours.  Also, if you miss three or more appointments without notifying the office, you may be dismissed from the clinic at the provider's discretion.      For prescription refill requests, have your pharmacy contact our office and allow 72 hours for refills to be completed.    Today you received the following chemotherapy and/or immunotherapy agents Taxol and Carboplatin        To help prevent nausea and vomiting after your treatment, we encourage you to take your nausea medication as directed.  BELOW ARE SYMPTOMS THAT SHOULD BE REPORTED IMMEDIATELY: *FEVER GREATER THAN 100.4 F (38 C) OR HIGHER *CHILLS OR SWEATING *NAUSEA AND VOMITING THAT IS NOT CONTROLLED WITH YOUR NAUSEA MEDICATION *UNUSUAL SHORTNESS OF BREATH *UNUSUAL BRUISING OR BLEEDING *URINARY PROBLEMS (pain or burning when urinating, or frequent urination) *BOWEL PROBLEMS (unusual diarrhea, constipation, pain near the anus) TENDERNESS IN MOUTH AND THROAT WITH OR WITHOUT PRESENCE OF ULCERS (sore throat, sores in mouth, or a toothache) UNUSUAL RASH, SWELLING OR PAIN  UNUSUAL VAGINAL DISCHARGE OR ITCHING   Items with * indicate a potential emergency and should be followed up as soon as possible or go to the Emergency Department if any problems should occur.  Please show the CHEMOTHERAPY ALERT CARD or IMMUNOTHERAPY ALERT CARD  at check-in to the Emergency Department and triage nurse.  Should you have questions after your visit or need to cancel or reschedule your appointment, please contact [facility] [place] [facility]  [phone] and follow the prompts.  Office hours are 8:00 a.m. to 4:30 p.m. Monday - Friday. Please note that voicemails left after 4:00 p.m. may not be returned until the following business day.  We are closed weekends and major holidays. You have access to a nurse at all times for urgent questions. Please call the main number to the clinic [phone] and follow the prompts.  For any non-urgent questions, you may also contact your provider using MyChart. We now offer e-Visits for anyone 18 and older to request care online for non-urgent symptoms. For details visit [email or web address].   Also download the MyChart app! Go to the app store, search "MyChart", open the app, select [facility], and log in with your MyChart username and password.    

## 2023-02-25 NOTE — Progress Notes (Signed)
Hematology/Oncology Consult note [facility]  Telephone:([phone] [phone]  Patient Care Team: Glori Luis, MD as PCP - General (Family Medicine) Hulen Luster, RN as Oncology Nurse Navigator Creig Hines, MD as Consulting Physician (Oncology)   Name of the patient: Eileen Hart  [id number]  1990/09/26   Date of visit: 02/25/23  Diagnosis- clinical prognostic stage Ib invasive mammary carcinoma of the right breast cT1 cN0 M0 triple negative   Chief complaint/ Reason for visit-on treatment assessment prior to cycle 7 of neoadjuvant weekly CarboTaxol chemotherapy  Heme/Onc history: Patient is a 32 year old female who underwent a bilateral diagnostic mammogram on 10/07/2022 after she felt a palpable area of concern in her right breast. Mammogram showed a 1.6 x 1 x 1.5 cm solid mass in the right breast 9:30 position 5 cm from the nipple. There was also an adjacent 6 x 9 x 2 mm mass in the right breast. No right axillary adenopathy. The dominant mass was biopsied and was consistent with invasive mammary carcinoma grade 3 ER and HER2 negative.    Bilateral breast MRI showed additional areas of concern in the right breast.  2 additional enhancing masses 1 in the 12 o'clock position and 1 in the 4 o'clock position.  2 adjacent prominent right axillary lymph nodes.  2 intermittent masses in the left breast 9 o'clock position and upper outer left breast.  This was followed by a dedicated ultrasound.  She had 3 breast biopsies in the right side and 1 left breast biopsy and all of them were negative for malignancy.  On the ultrasound her right axillary lymph nodes appeared normal and therefore biopsy was not recommended for the same.   PET CT scan showed focal hypermetabolic activity in the right breast adjacent to the biopsy clip but no other additional areas of concern.  Baseline MUGA scan showed normal EF.   Given that additional biopsies were negative  for malignancy and the only biopsy-proven site was 1.6 cm triple negative breast cancer with negative lymph nodes plan was to do [facility] Taxol chemotherapy neoadjuvant without opting for keynote 522 regimen.    Mild decrease in the size of primary breast mass after 4 cycles of AC chemotherapy. Carboplatin added to taxol neoadjuvantly     Interval history-she reports feeling jittery sometimes after she gets Benadryl for her premedications.  Neuropathy is mild and intermittent but does not persist.  ECOG PS- 0 Pain scale- 0   Review of systems- Review of Systems  Constitutional:  Negative for chills, fever, malaise/fatigue and weight loss.  HENT:  Negative for congestion, ear discharge and nosebleeds.   Eyes:  Negative for blurred vision.  Respiratory:  Negative for cough, hemoptysis, sputum production, shortness of breath and wheezing.   Cardiovascular:  Negative for chest pain, palpitations, orthopnea and claudication.  Gastrointestinal:  Negative for abdominal pain, blood in stool, constipation, diarrhea, heartburn, melena, nausea and vomiting.  Genitourinary:  Negative for dysuria, flank pain, frequency, hematuria and urgency.  Musculoskeletal:  Negative for back pain, joint pain and myalgias.  Skin:  Negative for rash.  Neurological:  Positive for sensory change (Peripheral neuropathy). Negative for dizziness, tingling, focal weakness, seizures, weakness and headaches.  Endo/Heme/Allergies:  Does not bruise/bleed easily.  Psychiatric/Behavioral:  Negative for depression and suicidal ideas. The patient does not have insomnia.       No Known Allergies   Past Medical History:  Diagnosis Date   Chlamydia 01/25/2010   Depression with  anxiety    Gestational hypertension    Immunization, viral disease    gardasil series completed   Tobacco abuse 05/02/2016     Past Surgical History:  Procedure Laterality Date   AUGMENTATION MAMMAPLASTY Bilateral    saline implants 2015   BREAST  BIOPSY Right 10/14/2022   US biopsy/ coil clip/ path pending   BREAST BIOPSY Right 10/14/2022   [place] RT BREAST BX W LOC DEV 1ST LESION IMG BX SPEC US GUIDE 10/14/2022 [facility]   BREAST BIOPSY Left 10/29/2022   [place] Core Bx Ribbon clip- path pending   BREAST BIOPSY Right 10/29/2022   [place] Core 1130 Venus Clip- path pending   BREAST BIOPSY Right 10/29/2022   [place] Core Bx Retroareolar heart clip-path pending   BREAST BIOPSY Right 10/29/2022   [place] Core Bx Ribbon Clip path pending   BREAST BIOPSY Right 10/29/2022   [place] RT BREAST BX W LOC DEV EA ADD LESION IMG BX SPEC US GUIDE 10/29/2022 [facility]   BREAST BIOPSY Right 10/29/2022   [place] RT BREAST BX W LOC DEV 1ST LESION IMG BX SPEC US GUIDE 10/29/2022 [facility]   BREAST BIOPSY Right 10/29/2022   [place] RT BREAST BX W LOC DEV EA ADD LESION IMG BX SPEC US GUIDE 10/29/2022 [facility]   BREAST BIOPSY Left 10/29/2022   [place] LT BREAST BX W LOC DEV 1ST LESION IMG BX SPEC US GUIDE 10/29/2022 [facility]   BREAST ENHANCEMENT SURGERY  2015   CESAREAN SECTION  2014   PORTACATH PLACEMENT N/A 10/24/2022   Procedure: INSERTION PORT-A-CATH;  Surgeon: Leafy Ro, MD;  Location: [facility];  Service: General;  Laterality: N/A;   TONSILLECTOMY      Social History   Socioeconomic History   Marital status: Single    Spouse name: Not on file   Number of children: 1   Years of education: 15   Highest education level: Not on file  Occupational History   Occupation: Groomer    Comment: [employer]  Tobacco Use   Smoking status: Former    Current packs/day: 0.00    Types: Cigarettes    Quit date: 07/25/2018    Years since quitting: 4.5   Smokeless tobacco: Never   Tobacco comments:    quit 07/2018  Vaping Use   Vaping status: Never Used  Substance and Sexual Activity   Alcohol use: No    Alcohol/week: 0.0 standard drinks of alcohol   Drug use: No   Sexual activity: Yes    Birth control/protection: Pill  Other Topics Concern   Not on file   Social History Narrative   Not on file   Social Determinants of Health   Financial Resource Strain: Not on file  Food Insecurity: No Food Insecurity (10/22/2022)   Hunger Vital Sign    Worried About Running Out of Food in the Last Year: Never true    Ran Out of Food in the Last Year: Never true  Transportation Needs: No Transportation Needs (10/22/2022)   PRAPARE - Administrator, Civil Service (Medical): No    Lack of Transportation (Non-Medical): No  Physical Activity: Not on file  Stress: Not on file  Social Connections: Not on file  Intimate Partner Violence: Not At Risk (10/22/2022)   Humiliation, Afraid, Rape, and Kick questionnaire    Fear of Current or Ex-Partner: No    Emotionally Abused: No    Physically Abused: No    Sexually Abused: No    Family History  Problem  Relation Age of Onset   Breast cancer Other 35       mat 2nd cousin   Diabetes Neg Hx    Heart disease Neg Hx    Hypertension Neg Hx    Ovarian cancer Neg Hx    Colon cancer Neg Hx      Current Outpatient Medications:    dexamethasone (DECADRON) 4 MG tablet, Take 2 tablets (8 mg total) by mouth daily for 3 days. Start the day after doxorubicin/cyclophosphamide chemotherapy. Take with food., Disp: 30 tablet, Rfl: 1   lidocaine-prilocaine (EMLA) cream, Apply to affected area once, Disp: 30 g, Rfl: 3   ondansetron (ZOFRAN) 8 MG tablet, Take 1 tab (8 mg) by mouth every 8 hrs as needed for nausea/vomiting. Start third day after doxorubicin/cyclophosphamide chemotherapy., Disp: 30 tablet, Rfl: 1   prochlorperazine (COMPAZINE) 10 MG tablet, Take 1 tablet (10 mg total) by mouth every 6 (six) hours as needed for nausea or vomiting., Disp: 30 tablet, Rfl: 1  Physical exam:  Vitals:   02/25/23 0956  BP: 120/62  Pulse: 64  Resp: 18  Temp: 99 F (37.2 C)  TempSrc: Tympanic  SpO2: 100%  Weight: 117 lb (53.1 kg)  Height: 5\' 4"  (1.626 m)   Physical Exam Cardiovascular:     Rate and Rhythm:  Normal rate and regular rhythm.     Heart sounds: Normal heart sounds.  Pulmonary:     Effort: Pulmonary effort is normal.  Skin:    General: Skin is warm and dry.  Neurological:     Mental Status: She is alert and oriented to person, place, and time.         Latest Ref Rng & Units 02/25/2023    9:27 AM  CMP  Glucose 70 - 99 mg/dL 161   BUN 6 - 20 mg/dL 10   Creatinine 0.96 - 1.00 mg/dL 0.45   Sodium 409 - 811 mmol/L 135   Potassium 3.5 - 5.1 mmol/L 3.7   Chloride 98 - 111 mmol/L 103   CO2 22 - 32 mmol/L 22   Calcium 8.9 - 10.3 mg/dL 9.0   Total Protein 6.5 - 8.1 g/dL 6.6   Total Bilirubin 0.3 - 1.2 mg/dL 0.4   Alkaline Phos 38 - 126 U/L 48   AST 15 - 41 U/L 30   ALT 0 - 44 U/L 38       Latest Ref Rng & Units 02/25/2023    9:27 AM  CBC  WBC 4.0 - 10.5 K/uL 3.9   Hemoglobin 12.0 - 15.0 g/dL 91.4   Hematocrit 78.2 - 46.0 % 32.2   Platelets 150 - 400 K/uL 198      Assessment and plan- Patient is a 32 y.o. 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 She is s/p 4 cycles of neoadjuvant AC chemotherapy.  She is here for on treatment assessment prior to cycle 7 of weekly CarboTaxol chemotherapy  Counts okay to proceed with cycle 7 of weekly CarboTaxol chemotherapy today.  She will directly proceed for cycle 8 next week and I will see her back in 2 weeks for cycle 9.  Plans to complete 12 cycles followed by definitive surgery.  Patient is still trying to decide as to what type of surgery she should undergo lumpectomy versus mastectomy.  Medically it is not necessary that she undergo mastectomy.  If she does not undergo mastectomy she will require postlumpectomy radiation as well as  surveillance mammograms alternating with MRIs.  If abnormalities are found on MRIs that could be a possibility of more biopsies in the future.  If she undergoes a mastectomy it is very likely that she will not require adjuvant radiation  therapy  Chemo-induced anemia: Stable continue to monitor  Chemo-induced peripheral neuropathy: Mild grade 1 and intermittent.  I am not reducing the dose of Taxol as of now but if it worsens I will reduce the dose of Taxol to 60 mg/m    Visit Diagnosis 1. Malignant neoplasm of upper-outer quadrant of right breast in female, estrogen receptor negative (HCC)   2. Encounter for antineoplastic chemotherapy   3. Chemotherapy-induced peripheral neuropathy (HCC)   4. Antineoplastic chemotherapy induced anemia      Dr. Owens Shark, MD, MPH [facility] [phone] 02/25/2023 4:20 PM

## 2023-02-25 NOTE — Progress Notes (Signed)
Nutrition Follow-up:  Patient with right breast cancer, triple negative.  Patient receiving taxol.    Met with patient and mother during infusion.  Patient sleeping. Spoke with mother.  Mother reports that patient is a picky eater.  Has lost the taste for chicken. Eating more spicy foods as they have more flavor.  Has also been making a protein drink (protein powder mixed with fairlife milk).      Medications: reviewed  Labs: reviewed  Anthropometrics:   Weight 117 lb today  115 lb on 7/24 117 lb 7 oz on 7/12 UBW of 115-120 lb (Wt increase due to medication)   NUTRITION DIAGNOSIS: none at this time   INTERVENTION:  Encouraged weight maintenance Continue foods rich in protein.  Mother has RD contact information and encouraged patient to call with questions or concerns    NEXT VISIT: no follow-up RD available if needed  Karole Oo B. Freida Busman, RD, LDN Registered Dietitian [phone]

## 2023-02-26 ENCOUNTER — Ambulatory Visit (INDEPENDENT_AMBULATORY_CARE_PROVIDER_SITE_OTHER): Payer: Medicaid Other | Admitting: Plastic Surgery

## 2023-02-26 ENCOUNTER — Encounter: Payer: Self-pay | Admitting: Oncology

## 2023-02-26 DIAGNOSIS — C50411 Malignant neoplasm of upper-outer quadrant of right female breast: Secondary | ICD-10-CM

## 2023-02-26 DIAGNOSIS — Z171 Estrogen receptor negative status [ER-]: Secondary | ICD-10-CM | POA: Diagnosis not present

## 2023-02-26 NOTE — Progress Notes (Signed)
   Subjective:    Patient ID: Eileen Hart, female    DOB: [birth year], 32 y.o.   MRN: [id number]  The patient is a 32 year old female joining me by phone for further discussion about her breast reconstruction.  She has spoken with the oncologist and feels it would be the safest move to go with bilateral mastectomies.  She had a diagnostic mammogram in April that was followed by biopsy of right breast lesions.  The biopsy showed invasive mammary carcinoma grade 3.  They are estrogen and HER2 negative.  The lesions were at the 12:00 and 4 o'clock position.  She also had some prominent right axillary lymph nodes.  She had some lesions on the left breast but they were negative for carcinoma.  She is still getting chemotherapy and seems to be having a good response.  As a review she has saline 350 cc implants in place since 2015.  She is also has a Port-A-Cath and a history of a C-section and tonsillectomy.        Review of Systems  Constitutional: Negative.        Objective:   Physical Exam      Assessment & Plan:     ICD-10-CM   1. Malignant neoplasm of upper-outer quadrant of right breast in female, estrogen receptor negative (HCC)  C50.411    Z17.1       I connected with  Eileen Hart on 02/26/23 by phone and verified that I am speaking with the correct person using two identifiers.  The patient was at home and I was at the office.  We spent 5 minutes in discussion.  The patient has decided on bilateral mastectomies with expander reconstruction.  I have let Dr. Everlene Farrier know.   I discussed the limitations of evaluation and management by telemedicine. The patient expressed understanding and agreed to proceed.

## 2023-02-28 MED FILL — Dexamethasone Sodium Phosphate Inj 100 MG/10ML: INTRAMUSCULAR | Qty: 1 | Status: AC

## 2023-03-03 ENCOUNTER — Inpatient Hospital Stay: Payer: Medicaid Other | Admitting: Oncology

## 2023-03-03 ENCOUNTER — Other Ambulatory Visit: Payer: Self-pay | Admitting: Oncology

## 2023-03-03 ENCOUNTER — Inpatient Hospital Stay: Payer: Medicaid Other

## 2023-03-03 ENCOUNTER — Encounter: Payer: Self-pay | Admitting: Oncology

## 2023-03-03 VITALS — BP 109/69 | HR 73 | Temp 97.6°F | Resp 18 | Wt 115.2 lb

## 2023-03-03 DIAGNOSIS — Z5111 Encounter for antineoplastic chemotherapy: Secondary | ICD-10-CM | POA: Diagnosis not present

## 2023-03-03 DIAGNOSIS — Z171 Estrogen receptor negative status [ER-]: Secondary | ICD-10-CM

## 2023-03-03 LAB — CBC WITH DIFFERENTIAL (CANCER CENTER ONLY)
Abs Immature Granulocytes: 0.02 10*3/uL (ref 0.00–0.07)
Basophils Absolute: 0 10*3/uL (ref 0.0–0.1)
Basophils Relative: 1 %
Eosinophils Absolute: 0 10*3/uL (ref 0.0–0.5)
Eosinophils Relative: 1 %
HCT: 33.1 % — ABNORMAL LOW (ref 36.0–46.0)
Hemoglobin: 11.1 g/dL — ABNORMAL LOW (ref 12.0–15.0)
Immature Granulocytes: 1 %
Lymphocytes Relative: 41 %
Lymphs Abs: 1.4 10*3/uL (ref 0.7–4.0)
MCH: 31.4 pg (ref 26.0–34.0)
MCHC: 33.5 g/dL (ref 30.0–36.0)
MCV: 93.5 fL (ref 80.0–100.0)
Monocytes Absolute: 0.1 10*3/uL (ref 0.1–1.0)
Monocytes Relative: 4 %
Neutro Abs: 1.8 10*3/uL (ref 1.7–7.7)
Neutrophils Relative %: 52 %
Platelet Count: 168 10*3/uL (ref 150–400)
RBC: 3.54 MIL/uL — ABNORMAL LOW (ref 3.87–5.11)
RDW: 15 % (ref 11.5–15.5)
WBC Count: 3.3 10*3/uL — ABNORMAL LOW (ref 4.0–10.5)
nRBC: 0 % (ref 0.0–0.2)

## 2023-03-03 LAB — CMP (CANCER CENTER ONLY)
ALT: 32 U/L (ref 0–44)
AST: 23 U/L (ref 15–41)
Albumin: 4 g/dL (ref 3.5–5.0)
Alkaline Phosphatase: 45 U/L (ref 38–126)
Anion gap: 5 (ref 5–15)
BUN: 11 mg/dL (ref 6–20)
CO2: 24 mmol/L (ref 22–32)
Calcium: 8.9 mg/dL (ref 8.9–10.3)
Chloride: 105 mmol/L (ref 98–111)
Creatinine: 0.51 mg/dL (ref 0.44–1.00)
GFR, Estimated: >60 mL/min (ref >60)
Glucose, Bld: 103 mg/dL — ABNORMAL HIGH (ref 70–99)
Potassium: 4 mmol/L (ref 3.5–5.1)
Sodium: 134 mmol/L — ABNORMAL LOW (ref 135–145)
Total Bilirubin: 0.6 mg/dL (ref 0.3–1.2)
Total Protein: 6.8 g/dL (ref 6.5–8.1)

## 2023-03-03 LAB — PREGNANCY, URINE: Preg Test, Ur: NEGATIVE

## 2023-03-03 MED ORDER — DIPHENHYDRAMINE HCL 50 MG/ML IJ SOLN
25.0000 mg | Freq: Once | INTRAMUSCULAR | Status: AC
  Administered 2023-03-03: 25 mg via INTRAVENOUS
  Filled 2023-03-03: qty 1

## 2023-03-03 MED ORDER — HEPARIN SOD (PORK) LOCK FLUSH 100 UNIT/ML IV SOLN
500.0000 [IU] | Freq: Once | INTRAVENOUS | Status: AC | PRN
  Administered 2023-03-03: 500 [IU]
  Filled 2023-03-03: qty 5

## 2023-03-03 MED ORDER — SODIUM CHLORIDE 0.9 % IV SOLN
Freq: Once | INTRAVENOUS | Status: AC
  Filled 2023-03-03: qty 250

## 2023-03-03 MED ORDER — SODIUM CHLORIDE 0.9 % IV SOLN
171.6000 mg | Freq: Once | INTRAVENOUS | Status: AC
  Administered 2023-03-03: 170 mg via INTRAVENOUS
  Filled 2023-03-03: qty 17

## 2023-03-03 MED ORDER — SODIUM CHLORIDE 0.9 % IV SOLN
10.0000 mg | Freq: Once | INTRAVENOUS | Status: AC
  Administered 2023-03-03: 10 mg via INTRAVENOUS
  Filled 2023-03-03: qty 10

## 2023-03-03 MED ORDER — DIPHENHYDRAMINE HCL 50 MG/ML IJ SOLN: 50.0000 mg | Freq: Once | INTRAMUSCULAR | Status: DC

## 2023-03-03 MED ORDER — PALONOSETRON HCL INJECTION 0.25 MG/5ML
0.2500 mg | Freq: Once | INTRAVENOUS | Status: AC
  Administered 2023-03-03: 0.25 mg via INTRAVENOUS
  Filled 2023-03-03: qty 5

## 2023-03-03 MED ORDER — FAMOTIDINE IN NACL 20-0.9 MG/50ML-% IV SOLN
20.0000 mg | Freq: Once | INTRAVENOUS | Status: AC
  Administered 2023-03-03: 20 mg via INTRAVENOUS
  Filled 2023-03-03: qty 50

## 2023-03-03 MED ORDER — SODIUM CHLORIDE 0.9 % IV SOLN
80.0000 mg/m2 | Freq: Once | INTRAVENOUS | Status: AC
  Administered 2023-03-03: 126 mg via INTRAVENOUS
  Filled 2023-03-03: qty 21

## 2023-03-07 ENCOUNTER — Other Ambulatory Visit: Payer: Self-pay

## 2023-03-07 MED FILL — Dexamethasone Sodium Phosphate Inj 100 MG/10ML: INTRAMUSCULAR | Qty: 1 | Status: AC

## 2023-03-10 ENCOUNTER — Inpatient Hospital Stay: Payer: Medicaid Other

## 2023-03-10 ENCOUNTER — Inpatient Hospital Stay (HOSPITAL_BASED_OUTPATIENT_CLINIC_OR_DEPARTMENT_OTHER): Payer: Medicaid Other | Admitting: Oncology

## 2023-03-10 ENCOUNTER — Encounter: Payer: Self-pay | Admitting: Oncology

## 2023-03-10 VITALS — BP 107/74 | HR 87 | Temp 97.3°F | Resp 18 | Wt 117.3 lb

## 2023-03-10 VITALS — BP 104/61 | HR 86

## 2023-03-10 DIAGNOSIS — C50411 Malignant neoplasm of upper-outer quadrant of right female breast: Secondary | ICD-10-CM

## 2023-03-10 DIAGNOSIS — D701 Agranulocytosis secondary to cancer chemotherapy: Secondary | ICD-10-CM

## 2023-03-10 DIAGNOSIS — G620 Drug-induced polyneuropathy: Secondary | ICD-10-CM | POA: Diagnosis not present

## 2023-03-10 DIAGNOSIS — Z5111 Encounter for antineoplastic chemotherapy: Secondary | ICD-10-CM | POA: Diagnosis not present

## 2023-03-10 DIAGNOSIS — T451X5A Adverse effect of antineoplastic and immunosuppressive drugs, initial encounter: Secondary | ICD-10-CM

## 2023-03-10 DIAGNOSIS — Z171 Estrogen receptor negative status [ER-]: Secondary | ICD-10-CM

## 2023-03-10 LAB — CBC WITH DIFFERENTIAL (CANCER CENTER ONLY)
Abs Immature Granulocytes: 0 10*3/uL (ref 0.00–0.07)
Basophils Absolute: 0 10*3/uL (ref 0.0–0.1)
Basophils Relative: 0 %
Eosinophils Absolute: 0.1 10*3/uL (ref 0.0–0.5)
Eosinophils Relative: 3 %
HCT: 29.3 % — ABNORMAL LOW (ref 36.0–46.0)
Hemoglobin: 9.8 g/dL — ABNORMAL LOW (ref 12.0–15.0)
Immature Granulocytes: 0 %
Lymphocytes Relative: 39 %
Lymphs Abs: 1 10*3/uL (ref 0.7–4.0)
MCH: 31.7 pg (ref 26.0–34.0)
MCHC: 33.4 g/dL (ref 30.0–36.0)
MCV: 94.8 fL (ref 80.0–100.0)
Monocytes Absolute: 0.2 10*3/uL (ref 0.1–1.0)
Monocytes Relative: 8 %
Neutro Abs: 1.3 10*3/uL — ABNORMAL LOW (ref 1.7–7.7)
Neutrophils Relative %: 50 %
Platelet Count: 185 10*3/uL (ref 150–400)
RBC: 3.09 MIL/uL — ABNORMAL LOW (ref 3.87–5.11)
RDW: 14.6 % (ref 11.5–15.5)
WBC Count: 2.6 10*3/uL — ABNORMAL LOW (ref 4.0–10.5)
nRBC: 0 % (ref 0.0–0.2)

## 2023-03-10 LAB — CMP (CANCER CENTER ONLY)
ALT: 35 U/L (ref 0–44)
AST: 26 U/L (ref 15–41)
Albumin: 3.9 g/dL (ref 3.5–5.0)
Alkaline Phosphatase: 40 U/L (ref 38–126)
Anion gap: 5 (ref 5–15)
BUN: 10 mg/dL (ref 6–20)
CO2: 22 mmol/L (ref 22–32)
Calcium: 8.8 mg/dL — ABNORMAL LOW (ref 8.9–10.3)
Chloride: 108 mmol/L (ref 98–111)
Creatinine: 0.46 mg/dL (ref 0.44–1.00)
GFR, Estimated: >60 mL/min (ref >60)
Glucose, Bld: 93 mg/dL (ref 70–99)
Potassium: 3.7 mmol/L (ref 3.5–5.1)
Sodium: 135 mmol/L (ref 135–145)
Total Bilirubin: 0.2 mg/dL — ABNORMAL LOW (ref 0.3–1.2)
Total Protein: 6.5 g/dL (ref 6.5–8.1)

## 2023-03-10 LAB — PREGNANCY, URINE: Preg Test, Ur: NEGATIVE

## 2023-03-10 MED ORDER — PALONOSETRON HCL INJECTION 0.25 MG/5ML
0.2500 mg | Freq: Once | INTRAVENOUS | Status: AC
  Administered 2023-03-10: 0.25 mg via INTRAVENOUS
  Filled 2023-03-10: qty 5

## 2023-03-10 MED ORDER — SODIUM CHLORIDE 0.9 % IV SOLN
Freq: Once | INTRAVENOUS | Status: AC
  Filled 2023-03-10: qty 250

## 2023-03-10 MED ORDER — SODIUM CHLORIDE 0.9 % IV SOLN
10.0000 mg | Freq: Once | INTRAVENOUS | Status: AC
  Administered 2023-03-10: 10 mg via INTRAVENOUS
  Filled 2023-03-10: qty 10

## 2023-03-10 MED ORDER — DIPHENHYDRAMINE HCL 50 MG/ML IJ SOLN
25.0000 mg | Freq: Once | INTRAMUSCULAR | Status: AC
  Administered 2023-03-10: 25 mg via INTRAVENOUS
  Filled 2023-03-10: qty 1

## 2023-03-10 MED ORDER — HEPARIN SOD (PORK) LOCK FLUSH 100 UNIT/ML IV SOLN
500.0000 [IU] | Freq: Once | INTRAVENOUS | Status: AC | PRN
  Administered 2023-03-10: 500 [IU]
  Filled 2023-03-10: qty 5

## 2023-03-10 MED ORDER — SODIUM CHLORIDE 0.9 % IV SOLN
171.6000 mg | Freq: Once | INTRAVENOUS | Status: AC
  Administered 2023-03-10: 170 mg via INTRAVENOUS
  Filled 2023-03-10: qty 17

## 2023-03-10 MED ORDER — FAMOTIDINE IN NACL 20-0.9 MG/50ML-% IV SOLN
20.0000 mg | Freq: Once | INTRAVENOUS | Status: AC
  Administered 2023-03-10: 20 mg via INTRAVENOUS
  Filled 2023-03-10: qty 50

## 2023-03-10 MED ORDER — SODIUM CHLORIDE 0.9 % IV SOLN
60.0000 mg/m2 | Freq: Once | INTRAVENOUS | Status: AC
  Administered 2023-03-10: 96 mg via INTRAVENOUS
  Filled 2023-03-10: qty 16

## 2023-03-10 NOTE — Progress Notes (Signed)
Hematology/Oncology Consult note [facility]  [phone] [phone]  Patient Care Team: Glori Luis, MD as PCP - General (Family Medicine) Hulen Luster, RN as Oncology Nurse Navigator Creig Hines, MD as Consulting Physician (Oncology)   Name of the patient: Eileen Hart  [id number]  April 12, 1991   Date of visit: 03/10/23  Diagnosis- clinical prognostic stage Ib invasive mammary carcinoma of the right breast cT1 cN0 M0 triple negative   Chief complaint/ Reason for visit-on treatment assessment prior to cycle 9 of neoadjuvant CarboTaxol chemotherapy  Heme/Onc history:  Patient is a 32 year old female who underwent a bilateral diagnostic mammogram on 10/07/2022 after she felt a palpable area of concern in her right breast. Mammogram showed a 1.6 x 1 x 1.5 cm solid mass in the right breast 9:30 position 5 cm from the nipple. There was also an adjacent 6 x 9 x 2 mm mass in the right breast. No right axillary adenopathy. The dominant mass was biopsied and was consistent with invasive mammary carcinoma grade 3 ER and HER2 negative.    Bilateral breast MRI showed additional areas of concern in the right breast.  2 additional enhancing masses 1 in the 12 o'clock position and 1 in the 4 o'clock position.  2 adjacent prominent right axillary lymph nodes.  2 intermittent masses in the left breast 9 o'clock position and upper outer left breast.  This was followed by a dedicated ultrasound.  She had 3 breast biopsies in the right side and 1 left breast biopsy and all of them were negative for malignancy.  On the ultrasound her right axillary lymph nodes appeared normal and therefore biopsy was not recommended for the same.   PET CT scan showed focal hypermetabolic activity in the right breast adjacent to the biopsy clip but no other additional areas of concern.  Baseline MUGA scan showed normal EF.   Given that additional biopsies were negative for  malignancy and the only biopsy-proven site was 1.6 cm triple negative breast cancer with negative lymph nodes plan was to do [facility] Taxol chemotherapy neoadjuvant without opting for keynote 522 regimen.    Mild decrease in the size of primary breast mass after 4 cycles of AC chemotherapy. Carboplatin added to taxol neoadjuvantly  Interval history-patient does report some numbness in her hands which comes and goes but has been more noticeable since the last cycle.  Denies any significant pain in her extremities.  ECOG PS- 0 Pain scale- 0  Review of systems- Review of Systems  Constitutional:  Negative for chills, fever, malaise/fatigue and weight loss.  HENT:  Negative for congestion, ear discharge and nosebleeds.   Eyes:  Negative for blurred vision.  Respiratory:  Negative for cough, hemoptysis, sputum production, shortness of breath and wheezing.   Cardiovascular:  Negative for chest pain, palpitations, orthopnea and claudication.  Gastrointestinal:  Negative for abdominal pain, blood in stool, constipation, diarrhea, heartburn, melena, nausea and vomiting.  Genitourinary:  Negative for dysuria, flank pain, frequency, hematuria and urgency.  Musculoskeletal:  Negative for back pain, joint pain and myalgias.  Skin:  Negative for rash.  Neurological:  Positive for sensory change (Peripheral neuropathy). Negative for dizziness, tingling, focal weakness, seizures, weakness and headaches.  Endo/Heme/Allergies:  Does not bruise/bleed easily.  Psychiatric/Behavioral:  Negative for depression and suicidal ideas. The patient does not have insomnia.       No Known Allergies   Past Medical History:  Diagnosis Date   Chlamydia 01/25/2010  Depression with anxiety    Gestational hypertension    Immunization, viral disease    gardasil series completed   Tobacco abuse 05/02/2016     Past Surgical History:  Procedure Laterality Date   AUGMENTATION MAMMAPLASTY Bilateral    saline implants 2015    BREAST BIOPSY Right 10/14/2022   US biopsy/ coil clip/ path pending   BREAST BIOPSY Right 10/14/2022   [place] RT BREAST BX W LOC DEV 1ST LESION IMG BX SPEC US GUIDE 10/14/2022 [facility]   BREAST BIOPSY Left 10/29/2022   [place] Core Bx Ribbon clip- path pending   BREAST BIOPSY Right 10/29/2022   [place] Core 1130 Venus Clip- path pending   BREAST BIOPSY Right 10/29/2022   [place] Core Bx Retroareolar heart clip-path pending   BREAST BIOPSY Right 10/29/2022   [place] Core Bx Ribbon Clip path pending   BREAST BIOPSY Right 10/29/2022   [place] RT BREAST BX W LOC DEV EA ADD LESION IMG BX SPEC US GUIDE 10/29/2022 [facility]   BREAST BIOPSY Right 10/29/2022   [place] RT BREAST BX W LOC DEV 1ST LESION IMG BX SPEC US GUIDE 10/29/2022 [facility]   BREAST BIOPSY Right 10/29/2022   [place] RT BREAST BX W LOC DEV EA ADD LESION IMG BX SPEC US GUIDE 10/29/2022 [facility]   BREAST BIOPSY Left 10/29/2022   [place] LT BREAST BX W LOC DEV 1ST LESION IMG BX SPEC US GUIDE 10/29/2022 [facility]   BREAST ENHANCEMENT SURGERY  2015   CESAREAN SECTION  2014   PORTACATH PLACEMENT N/A 10/24/2022   Procedure: INSERTION PORT-A-CATH;  Surgeon: Leafy Ro, MD;  Location: [facility];  Service: General;  Laterality: N/A;   TONSILLECTOMY      Social History   Socioeconomic History   Marital status: Single    Spouse name: Not on file   Number of children: 1   Years of education: 15   Highest education level: Not on file  Occupational History   Occupation: Groomer    Comment: [employer]  Tobacco Use   Smoking status: Former    Current packs/day: 0.00    Types: Cigarettes    Quit date: 07/25/2018    Years since quitting: 4.6   Smokeless tobacco: Never   Tobacco comments:    quit 07/2018  Vaping Use   Vaping status: Never Used  Substance and Sexual Activity   Alcohol use: No    Alcohol/week: 0.0 standard drinks of alcohol   Drug use: No   Sexual activity: Yes    Birth control/protection: Pill  Other Topics Concern    Not on file  Social History Narrative   Not on file   Social Determinants of Health   Financial Resource Strain: Not on file  Food Insecurity: No Food Insecurity (10/22/2022)   Hunger Vital Sign    Worried About Running Out of Food in the Last Year: Never true    Ran Out of Food in the Last Year: Never true  Transportation Needs: No Transportation Needs (10/22/2022)   PRAPARE - Administrator, Civil Service (Medical): No    Lack of Transportation (Non-Medical): No  Physical Activity: Not on file  Stress: Not on file  Social Connections: Not on file  Intimate Partner Violence: Not At Risk (10/22/2022)   Humiliation, Afraid, Rape, and Kick questionnaire    Fear of Current or Ex-Partner: No    Emotionally Abused: No    Physically Abused: No    Sexually Abused: No    Family History  Problem Relation Age of Onset   Breast cancer Other 35       mat 2nd cousin   Diabetes Neg Hx    Heart disease Neg Hx    Hypertension Neg Hx    Ovarian cancer Neg Hx    Colon cancer Neg Hx      Current Outpatient Medications:    dexamethasone (DECADRON) 4 MG tablet, Take 2 tablets (8 mg total) by mouth daily for 3 days. Start the day after doxorubicin/cyclophosphamide chemotherapy. Take with food., Disp: 30 tablet, Rfl: 1   lidocaine-prilocaine (EMLA) cream, Apply to affected area once, Disp: 30 g, Rfl: 3   ondansetron (ZOFRAN) 8 MG tablet, Take 1 tab (8 mg) by mouth every 8 hrs as needed for nausea/vomiting. Start third day after doxorubicin/cyclophosphamide chemotherapy., Disp: 30 tablet, Rfl: 1   prochlorperazine (COMPAZINE) 10 MG tablet, Take 1 tablet (10 mg total) by mouth every 6 (six) hours as needed for nausea or vomiting., Disp: 30 tablet, Rfl: 1  Physical exam: There were no vitals filed for this visit. Physical Exam Cardiovascular:     Rate and Rhythm: Normal rate and regular rhythm.     Heart sounds: Normal heart sounds.  Pulmonary:     Effort: Pulmonary effort is normal.   Skin:    General: Skin is warm and dry.  Neurological:     Mental Status: She is alert and oriented to person, place, and time.         Latest Ref Rng & Units 03/03/2023    9:36 AM  CMP  Glucose 70 - 99 mg/dL 409   BUN 6 - 20 mg/dL 11   Creatinine 8.11 - 1.00 mg/dL 9.14   Sodium 782 - 956 mmol/L 134   Potassium 3.5 - 5.1 mmol/L 4.0   Chloride 98 - 111 mmol/L 105   CO2 22 - 32 mmol/L 24   Calcium 8.9 - 10.3 mg/dL 8.9   Total Protein 6.5 - 8.1 g/dL 6.8   Total Bilirubin 0.3 - 1.2 mg/dL 0.6   Alkaline Phos 38 - 126 U/L 45   AST 15 - 41 U/L 23   ALT 0 - 44 U/L 32       Latest Ref Rng & Units 03/10/2023    8:21 AM  CBC  WBC 4.0 - 10.5 K/uL 2.6   Hemoglobin 12.0 - 15.0 g/dL 9.8   Hematocrit 21.3 - 46.0 % 29.3   Platelets 150 - 400 K/uL 185     Assessment and plan- Patient is a 32 y.o. 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 She is s/p 4 cycles of neoadjuvant AC chemotherapy.  She is here for on treatment assessment prior to cycle 9 of weekly CarboTaxol chemotherapy  Counts okay to proceed with cycle 9 of weekly CarboTaxol chemotherapy today.  She will directly proceed to cycle 10 next week and I will see her back in 2 weeks for cycle 11 of CarboTaxol chemotherapy.  Plan is to complete 12 weekly cycles of CarboTaxol chemotherapy followed by definitive surgery.  Patient has decided to go for bilateral mastectomy with reconstruction.  White cell count is 2.6 today with an ANC of 1.3.  She will receive Zarzio for the next 3 days.  Chemo-induced peripheral neuropathy: I am reducing the dose of Taxol to 60 mg/m for the remainder of her treatment.   Visit Diagnosis 1. Encounter for antineoplastic chemotherapy   2. Malignant neoplasm of upper-outer  quadrant of right breast in female, estrogen receptor negative (HCC)   3. Chemotherapy-induced peripheral neuropathy (HCC)   4. Chemotherapy induced neutropenia  (HCC)      Dr. Owens Shark, MD, MPH [facility] at [facility] [phone] 03/10/2023 8:43 AM

## 2023-03-10 NOTE — Patient Instructions (Signed)
[facility] [place] [facility]  Discharge Instructions: Thank you for choosing [facility] to provide your oncology and hematology care.  If you have a lab appointment with the Cancer Center, please go directly to the Cancer Center and check in at the registration area.  Wear comfortable clothing and clothing appropriate for easy access to any Portacath or PICC line.   We strive to give you quality time with your provider. You may need to reschedule your appointment if you arrive late (15 or more minutes).  Arriving late affects you and other patients whose appointments are after yours.  Also, if you miss three or more appointments without notifying the office, you may be dismissed from the clinic at the provider's discretion.      For prescription refill requests, have your pharmacy contact our office and allow 72 hours for refills to be completed.    Today you received the following chemotherapy and/or immunotherapy agents Taxol & Carboplatin    To help prevent nausea and vomiting after your treatment, we encourage you to take your nausea medication as directed.  BELOW ARE SYMPTOMS THAT SHOULD BE REPORTED IMMEDIATELY: *FEVER GREATER THAN 100.4 F (38 C) OR HIGHER *CHILLS OR SWEATING *NAUSEA AND VOMITING THAT IS NOT CONTROLLED WITH YOUR NAUSEA MEDICATION *UNUSUAL SHORTNESS OF BREATH *UNUSUAL BRUISING OR BLEEDING *URINARY PROBLEMS (pain or burning when urinating, or frequent urination) *BOWEL PROBLEMS (unusual diarrhea, constipation, pain near the anus) TENDERNESS IN MOUTH AND THROAT WITH OR WITHOUT PRESENCE OF ULCERS (sore throat, sores in mouth, or a toothache) UNUSUAL RASH, SWELLING OR PAIN  UNUSUAL VAGINAL DISCHARGE OR ITCHING   Items with * indicate a potential emergency and should be followed up as soon as possible or go to the Emergency Department if any problems should occur.  Please show the CHEMOTHERAPY ALERT CARD or IMMUNOTHERAPY ALERT CARD at  check-in to the Emergency Department and triage nurse.  Should you have questions after your visit or need to cancel or reschedule your appointment, please contact [place] [facility] [facility]  [phone] and follow the prompts.  Office hours are 8:00 a.m. to 4:30 p.m. Monday - Friday. Please note that voicemails left after 4:00 p.m. may not be returned until the following business day.  We are closed weekends and major holidays. You have access to a nurse at all times for urgent questions. Please call the main number to the clinic [phone] and follow the prompts.  For any non-urgent questions, you may also contact your provider using MyChart. We now offer e-Visits for anyone 66 and older to request care online for non-urgent symptoms. For details visit [email or web address].   Also download the MyChart app! Go to the app store, search "MyChart", open the app, select [facility], and log in with your MyChart username and password.

## 2023-03-11 ENCOUNTER — Telehealth: Payer: Self-pay | Admitting: Plastic Surgery

## 2023-03-11 ENCOUNTER — Inpatient Hospital Stay: Payer: Medicaid Other

## 2023-03-11 DIAGNOSIS — Z5111 Encounter for antineoplastic chemotherapy: Secondary | ICD-10-CM | POA: Diagnosis not present

## 2023-03-11 DIAGNOSIS — Z171 Estrogen receptor negative status [ER-]: Secondary | ICD-10-CM

## 2023-03-11 MED ORDER — FILGRASTIM-AAFI 300 MCG/0.5ML IJ SOSY
300.0000 ug | PREFILLED_SYRINGE | Freq: Every day | INTRAMUSCULAR | Status: DC
  Administered 2023-03-11: 300 ug via SUBCUTANEOUS
  Filled 2023-03-11: qty 0.5

## 2023-03-11 NOTE — Telephone Encounter (Signed)
Patient called and has been expecting a phone call regarding moving ahead with the breast reconstruction & mastectomy surgeries.  She wanted to know if you have been able to talk to her general surgeon.  She would like an update on the surgeries.  Please call her at [phone].

## 2023-03-12 ENCOUNTER — Inpatient Hospital Stay: Payer: Medicaid Other

## 2023-03-12 DIAGNOSIS — Z5111 Encounter for antineoplastic chemotherapy: Secondary | ICD-10-CM | POA: Diagnosis not present

## 2023-03-12 DIAGNOSIS — C50411 Malignant neoplasm of upper-outer quadrant of right female breast: Secondary | ICD-10-CM

## 2023-03-12 MED ORDER — FILGRASTIM-AAFI 300 MCG/0.5ML IJ SOSY
300.0000 ug | PREFILLED_SYRINGE | Freq: Every day | INTRAMUSCULAR | Status: DC
  Administered 2023-03-12: 300 ug via SUBCUTANEOUS
  Filled 2023-03-12: qty 0.5

## 2023-03-13 ENCOUNTER — Inpatient Hospital Stay: Payer: Medicaid Other

## 2023-03-13 DIAGNOSIS — Z5111 Encounter for antineoplastic chemotherapy: Secondary | ICD-10-CM | POA: Diagnosis not present

## 2023-03-13 DIAGNOSIS — Z171 Estrogen receptor negative status [ER-]: Secondary | ICD-10-CM

## 2023-03-13 MED ORDER — FILGRASTIM-AAFI 300 MCG/0.5ML IJ SOSY
300.0000 ug | PREFILLED_SYRINGE | Freq: Every day | INTRAMUSCULAR | Status: DC
  Administered 2023-03-13: 300 ug via SUBCUTANEOUS
  Filled 2023-03-13: qty 0.5

## 2023-03-14 MED FILL — Dexamethasone Sodium Phosphate Inj 100 MG/10ML: INTRAMUSCULAR | Qty: 1 | Status: AC

## 2023-03-17 ENCOUNTER — Inpatient Hospital Stay: Payer: Medicaid Other

## 2023-03-17 VITALS — BP 107/75 | HR 89 | Temp 98.4°F | Resp 16

## 2023-03-17 DIAGNOSIS — Z171 Estrogen receptor negative status [ER-]: Secondary | ICD-10-CM

## 2023-03-17 DIAGNOSIS — Z5111 Encounter for antineoplastic chemotherapy: Secondary | ICD-10-CM | POA: Diagnosis not present

## 2023-03-17 LAB — CMP (CANCER CENTER ONLY)
ALT: 43 U/L (ref 0–44)
AST: 29 U/L (ref 15–41)
Albumin: 4 g/dL (ref 3.5–5.0)
Alkaline Phosphatase: 55 U/L (ref 38–126)
Anion gap: 9 (ref 5–15)
BUN: 9 mg/dL (ref 6–20)
CO2: 22 mmol/L (ref 22–32)
Calcium: 9.1 mg/dL (ref 8.9–10.3)
Chloride: 106 mmol/L (ref 98–111)
Creatinine: 0.53 mg/dL (ref 0.44–1.00)
GFR, Estimated: >60 mL/min (ref >60)
Glucose, Bld: 104 mg/dL — ABNORMAL HIGH (ref 70–99)
Potassium: 4 mmol/L (ref 3.5–5.1)
Sodium: 137 mmol/L (ref 135–145)
Total Bilirubin: 0.5 mg/dL (ref 0.3–1.2)
Total Protein: 6.9 g/dL (ref 6.5–8.1)

## 2023-03-17 LAB — CBC WITH DIFFERENTIAL (CANCER CENTER ONLY)
Abs Immature Granulocytes: 0.02 10*3/uL (ref 0.00–0.07)
Basophils Absolute: 0 10*3/uL (ref 0.0–0.1)
Basophils Relative: 1 %
Eosinophils Absolute: 0.1 10*3/uL (ref 0.0–0.5)
Eosinophils Relative: 3 %
HCT: 33.5 % — ABNORMAL LOW (ref 36.0–46.0)
Hemoglobin: 11.1 g/dL — ABNORMAL LOW (ref 12.0–15.0)
Immature Granulocytes: 1 %
Lymphocytes Relative: 45 %
Lymphs Abs: 1.6 10*3/uL (ref 0.7–4.0)
MCH: 31.4 pg (ref 26.0–34.0)
MCHC: 33.1 g/dL (ref 30.0–36.0)
MCV: 94.9 fL (ref 80.0–100.0)
Monocytes Absolute: 0.6 10*3/uL (ref 0.1–1.0)
Monocytes Relative: 17 %
Neutro Abs: 1.2 10*3/uL — ABNORMAL LOW (ref 1.7–7.7)
Neutrophils Relative %: 33 %
Platelet Count: 202 10*3/uL (ref 150–400)
RBC: 3.53 MIL/uL — ABNORMAL LOW (ref 3.87–5.11)
RDW: 15.4 % (ref 11.5–15.5)
WBC Count: 3.5 10*3/uL — ABNORMAL LOW (ref 4.0–10.5)
nRBC: 0 % (ref 0.0–0.2)

## 2023-03-17 MED ORDER — HEPARIN SOD (PORK) LOCK FLUSH 100 UNIT/ML IV SOLN
500.0000 [IU] | Freq: Once | INTRAVENOUS | Status: AC | PRN
  Administered 2023-03-17: 500 [IU]
  Filled 2023-03-17: qty 5

## 2023-03-17 MED ORDER — SODIUM CHLORIDE 0.9 % IV SOLN
171.6000 mg | Freq: Once | INTRAVENOUS | Status: AC
  Administered 2023-03-17: 170 mg via INTRAVENOUS
  Filled 2023-03-17: qty 17

## 2023-03-17 MED ORDER — DIPHENHYDRAMINE HCL 50 MG/ML IJ SOLN
25.0000 mg | Freq: Once | INTRAMUSCULAR | Status: AC
  Administered 2023-03-17: 25 mg via INTRAVENOUS
  Filled 2023-03-17: qty 1

## 2023-03-17 MED ORDER — SODIUM CHLORIDE 0.9% FLUSH
10.0000 mL | INTRAVENOUS | Status: DC | PRN
  Administered 2023-03-17: 10 mL
  Filled 2023-03-17: qty 10

## 2023-03-17 MED ORDER — SODIUM CHLORIDE 0.9 % IV SOLN
10.0000 mg | Freq: Once | INTRAVENOUS | Status: AC
  Administered 2023-03-17: 10 mg via INTRAVENOUS
  Filled 2023-03-17: qty 10

## 2023-03-17 MED ORDER — SODIUM CHLORIDE 0.9 % IV SOLN
Freq: Once | INTRAVENOUS | Status: AC
  Filled 2023-03-17: qty 250

## 2023-03-17 MED ORDER — PALONOSETRON HCL INJECTION 0.25 MG/5ML
0.2500 mg | Freq: Once | INTRAVENOUS | Status: AC
  Administered 2023-03-17: 0.25 mg via INTRAVENOUS
  Filled 2023-03-17: qty 5

## 2023-03-17 MED ORDER — SODIUM CHLORIDE 0.9 % IV SOLN
60.0000 mg/m2 | Freq: Once | INTRAVENOUS | Status: AC
  Administered 2023-03-17: 96 mg via INTRAVENOUS
  Filled 2023-03-17: qty 16

## 2023-03-17 MED ORDER — FAMOTIDINE IN NACL 20-0.9 MG/50ML-% IV SOLN
20.0000 mg | Freq: Once | INTRAVENOUS | Status: AC
  Administered 2023-03-17: 20 mg via INTRAVENOUS
  Filled 2023-03-17: qty 50

## 2023-03-17 NOTE — Patient Instructions (Signed)
[facility] [facility]  Discharge Instructions: Thank you for choosing [facility] to provide your oncology and hematology care.  If you have a lab appointment with the Cancer Center, please go directly to the Cancer Center and check in at the registration area.  Wear comfortable clothing and clothing appropriate for easy access to any Portacath or PICC line.   We strive to give you quality time with your provider. You may need to reschedule your appointment if you arrive late (15 or more minutes).  Arriving late affects you and other patients whose appointments are after yours.  Also, if you miss three or more appointments without notifying the office, you may be dismissed from the clinic at the provider's discretion.      For prescription refill requests, have your pharmacy contact our office and allow 72 hours for refills to be completed.    Today you received the following chemotherapy and/or immunotherapy agents- taxol, carboplatin      To help prevent nausea and vomiting after your treatment, we encourage you to take your nausea medication as directed.  BELOW ARE SYMPTOMS THAT SHOULD BE REPORTED IMMEDIATELY: *FEVER GREATER THAN 100.4 F (38 C) OR HIGHER *CHILLS OR SWEATING *NAUSEA AND VOMITING THAT IS NOT CONTROLLED WITH YOUR NAUSEA MEDICATION *UNUSUAL SHORTNESS OF BREATH *UNUSUAL BRUISING OR BLEEDING *URINARY PROBLEMS (pain or burning when urinating, or frequent urination) *BOWEL PROBLEMS (unusual diarrhea, constipation, pain near the anus) TENDERNESS IN MOUTH AND THROAT WITH OR WITHOUT PRESENCE OF ULCERS (sore throat, sores in mouth, or a toothache) UNUSUAL RASH, SWELLING OR PAIN  UNUSUAL VAGINAL DISCHARGE OR ITCHING   Items with * indicate a potential emergency and should be followed up as soon as possible or go to the Emergency Department if any problems should occur.  Please show the CHEMOTHERAPY ALERT CARD or IMMUNOTHERAPY ALERT CARD at  check-in to the Emergency Department and triage nurse.  Should you have questions after your visit or need to cancel or reschedule your appointment, please contact [place] [facility]  [phone] and follow the prompts.  Office hours are 8:00 a.m. to 4:30 p.m. Monday - Friday. Please note that voicemails left after 4:00 p.m. may not be returned until the following business day.  We are closed weekends and major holidays. You have access to a nurse at all times for urgent questions. Please call the main number to the clinic [phone] and follow the prompts.  For any non-urgent questions, you may also contact your provider using MyChart. We now offer e-Visits for anyone 39 and older to request care online for non-urgent symptoms. For details visit [email or web address].   Also download the MyChart app! Go to the app store, search "MyChart", open the app, select [facility], and log in with your MyChart username and password.

## 2023-03-17 NOTE — Progress Notes (Signed)
Ok to tx with low ANC.  Pt to receive zarxio x 3 days this week per Carlena Sax, [place]

## 2023-03-18 ENCOUNTER — Inpatient Hospital Stay: Payer: Medicaid Other

## 2023-03-18 DIAGNOSIS — Z171 Estrogen receptor negative status [ER-]: Secondary | ICD-10-CM

## 2023-03-18 DIAGNOSIS — Z5111 Encounter for antineoplastic chemotherapy: Secondary | ICD-10-CM | POA: Diagnosis not present

## 2023-03-18 MED ORDER — FILGRASTIM-AAFI 300 MCG/0.5ML IJ SOSY
300.0000 ug | PREFILLED_SYRINGE | Freq: Every day | INTRAMUSCULAR | Status: DC
  Administered 2023-03-18: 300 ug via SUBCUTANEOUS
  Filled 2023-03-18: qty 0.5

## 2023-03-19 ENCOUNTER — Encounter: Payer: Self-pay | Admitting: Surgery

## 2023-03-19 ENCOUNTER — Ambulatory Visit: Payer: Medicaid Other | Admitting: Surgery

## 2023-03-19 ENCOUNTER — Inpatient Hospital Stay: Payer: Medicaid Other

## 2023-03-19 VITALS — BP 115/79 | HR 86 | Temp 98.5°F | Ht 64.0 in | Wt 117.2 lb

## 2023-03-19 DIAGNOSIS — Z171 Estrogen receptor negative status [ER-]: Secondary | ICD-10-CM

## 2023-03-19 DIAGNOSIS — C50411 Malignant neoplasm of upper-outer quadrant of right female breast: Secondary | ICD-10-CM

## 2023-03-19 DIAGNOSIS — Z5111 Encounter for antineoplastic chemotherapy: Secondary | ICD-10-CM | POA: Diagnosis not present

## 2023-03-19 MED ORDER — FILGRASTIM-AAFI 300 MCG/0.5ML IJ SOSY
300.0000 ug | PREFILLED_SYRINGE | Freq: Every day | INTRAMUSCULAR | Status: DC
  Administered 2023-03-19: 300 ug via SUBCUTANEOUS
  Filled 2023-03-19: qty 0.5

## 2023-03-19 NOTE — Patient Instructions (Signed)
We have spoken today about breast surgery. Your Mastectomy will be scheduled at [facility] with Dr. Everlene Farrier.  You will most likely go home the same day but may spend at least 1 night in the hospital following surgery and go home with 1-2 drains for approximately 5-7 days following your surgery. Please keep an accurate record of your drain amount in ml's or cc's. If your drain suddenly stops draining or has drainage around the tube at the skin, call our office and speak with a nurse immediately.  Please see the (blue) pre-care surgery sheet that you have been given today for more information regarding your surgery. Our surgery scheduler will call you to look at surgery dates and to go over information about your surgery.   If you have FMLA or Disability paperwork that needs to be filled out, please have your company fax your paperwork to [phone] or you may drop this by either office. This paperwork will be filled out within 3 days after your surgery has been completed.  Information regarding your surgery has been provided below. If you have any questions or concerns, please call our office and speak with a nurse.   Total or Modified Radical Mastectomy A total mastectomy and a modified radical mastectomy are types of surgery for breast cancer. If you are having a total mastectomy (simple mastectomy), your entire breast will be removed. If you are having a modified radical mastectomy, your breast and nipple will be removed along with the lymph nodes under your arm. You may also have some of the lining over the muscle tissues under your breast removed. LET [facility] CARE PROVIDER KNOW ABOUT: Any allergies you have. All medicines you are taking, including vitamins, herbs, eye drops, creams, and over-the-counter medicines. Previous problems you or members of your family have had with the use of anesthetics. Any blood disorders you have. Previous surgeries you have had. Medical conditions you  have. RISKS AND COMPLICATIONS Generally, this is a safe procedure. However, problems may occur, including: Pain. Infection. Bleeding. Scar tissue. Chest numbness on the side of the surgery. Fluid buildup under the skin flaps where your breast was removed (seroma). Sensation of throbbing or tingling. Stress or sadness from losing your breast. If you have the lymph nodes under your arm removed, you may have arm swelling, weakness, or numbness on the same side of your body as your surgery. BEFORE THE PROCEDURE Ask your health care provider about: Changing or stopping your regular medicines. This is especially important if you are taking diabetes medicines or blood thinners. Taking medicines such as aspirin and ibuprofen. These medicines can thin your blood. Do not take these medicines before your procedure if your health care provider instructs you not to. Follow your health care provider's instructions about eating or drinking restrictions. Plan to have someone take you home after the procedure. PROCEDURE An IV tube will be inserted into one of your veins. You will be given a medicine that makes you fall asleep (general anesthetic). Your breast will be cleaned with a germ-killing solution (antiseptic). A wide incision will be made around your nipple. The skin and nipple inside the incision will be removed along with all breast tissue. If you are having a modified radical mastectomy: The lining over your chest muscles will be removed. The incision may be extended to reach the lymph nodes under your arm, or a second incision may be made. The lymph nodes will be removed. You may have a drainage tube inserted into  your incision to collect fluid that builds up after surgery. This tube is connected to a suction bulb. Your incision or incisions will be closed with stitches (sutures). A bandage (dressing) will be placed over your breast and under your arm. The procedure may vary among health care  providers and hospitals. AFTER THE PROCEDURE You will be moved to a recovery area. Your blood pressure, heart rate, breathing rate, and blood oxygen level will be monitored often until the medicines you were given have worn off. You will be given pain medicine as needed. After a while, you will be taken to a hospital room. You will be encouraged to get up and walk as soon as you can. Your IV tube can be removed when you are able to eat and drink. Your drain may be removed before you go home from the hospital, or you may be sent home with your drain and suction bulb.   This information is not intended to replace advice given to you by your health care provider. Make sure you discuss any questions you have with your health care provider.   Document Released: 03/05/2001 Document Revised: 07/01/2014 Document Reviewed: 02/23/2014 Elsevier Interactive Patient Education [employer]

## 2023-03-20 ENCOUNTER — Telehealth: Payer: Self-pay | Admitting: Surgery

## 2023-03-20 ENCOUNTER — Inpatient Hospital Stay: Payer: Medicaid Other

## 2023-03-20 ENCOUNTER — Encounter: Payer: Self-pay | Admitting: Surgery

## 2023-03-20 DIAGNOSIS — Z171 Estrogen receptor negative status [ER-]: Secondary | ICD-10-CM

## 2023-03-20 DIAGNOSIS — Z5111 Encounter for antineoplastic chemotherapy: Secondary | ICD-10-CM | POA: Diagnosis not present

## 2023-03-20 MED ORDER — FILGRASTIM-AAFI 300 MCG/0.5ML IJ SOSY
300.0000 ug | PREFILLED_SYRINGE | Freq: Every day | INTRAMUSCULAR | Status: DC
  Administered 2023-03-20: 300 ug via SUBCUTANEOUS
  Filled 2023-03-20: qty 0.5

## 2023-03-20 NOTE — Progress Notes (Addendum)
Outpatient Surgical Follow Up   Eileen Hart is an 32 y.o. female.   Chief Complaint  Patient presents with   Follow-up    Right breast cancer    HPI:  Eileen Hart is a 32 y.o. female seen in f/u w a diagnosis of triple negative right breast CA T1c N0 M0. Mass on Mammogram  1.6 x 1 x 1.5 cm solid mass in the right breast 9:30 position 5 cm from the nipple.  She did have neoadjuvant with good response.  She did HAve bilateral MRI w additional biopsies. She had 3 additional breast biopsies in the right side and 1 left breast biopsy and all of them were negative for malignancy. PET CT scan showed focal hypermetabolic activity in the right breast adjacent to the biopsy clip but no other additional areas of concern.  She did have a history of breast augmentation with submuscular saline implants.   She Is able to perform more than 4 METS of activity without any shortness of breath or chest pain.  She quit smoking more than 3 years ago but she still uses nicotine products.  Her mother had a history of first-degree cousin with breast cancer that developed around age 21.  No other family members with cancer. Menarche at age 40.  Gravida 2 para 1M1.  She did have breast augmentation surgery right after breast-feeding. She Does take birth control pills She Did have a C-section 9 years ago. SHe is accompanied by her mother She is about to complete her chemo in 2 weeks, she has decided to proceed with bilateral mastectomies w reconstruction  Past Medical History:  Diagnosis Date   Chlamydia 01/25/2010   Depression with anxiety    Gestational hypertension    Immunization, viral disease    gardasil series completed   Tobacco abuse 05/02/2016    Past Surgical History:  Procedure Laterality Date   AUGMENTATION MAMMAPLASTY Bilateral    saline implants 2015   BREAST BIOPSY Right 10/14/2022   US biopsy/ coil clip/ path pending   BREAST BIOPSY Right 10/14/2022   [place] RT BREAST BX W LOC DEV 1ST  LESION IMG BX SPEC US GUIDE 10/14/2022 [facility]   BREAST BIOPSY Left 10/29/2022   [place] Core Bx Ribbon clip- path pending   BREAST BIOPSY Right 10/29/2022   [place] Core 1130 Venus Clip- path pending   BREAST BIOPSY Right 10/29/2022   [place] Core Bx Retroareolar heart clip-path pending   BREAST BIOPSY Right 10/29/2022   [place] Core Bx Ribbon Clip path pending   BREAST BIOPSY Right 10/29/2022   [place] RT BREAST BX W LOC DEV EA ADD LESION IMG BX SPEC US GUIDE 10/29/2022 [facility]   BREAST BIOPSY Right 10/29/2022   [place] RT BREAST BX W LOC DEV 1ST LESION IMG BX SPEC US GUIDE 10/29/2022 [facility]   BREAST BIOPSY Right 10/29/2022   [place] RT BREAST BX W LOC DEV EA ADD LESION IMG BX SPEC US GUIDE 10/29/2022 [facility]   BREAST BIOPSY Left 10/29/2022   [place] LT BREAST BX W LOC DEV 1ST LESION IMG BX SPEC US GUIDE 10/29/2022 [facility]   BREAST ENHANCEMENT SURGERY  2015   CESAREAN SECTION  2014   PORTACATH PLACEMENT N/A 10/24/2022   Procedure: INSERTION PORT-A-CATH;  Surgeon: Leafy Ro, MD;  Location: [facility];  Service: General;  Laterality: N/A;   TONSILLECTOMY      Family History  Problem Relation Age of Onset   Breast cancer Other 11  mat 2nd cousin   Diabetes Neg Hx    Heart disease Neg Hx    Hypertension Neg Hx    Ovarian cancer Neg Hx    Colon cancer Neg Hx     Social History:  reports that she quit smoking about 4 years ago. Her smoking use included cigarettes. She has never used smokeless tobacco. She reports that she does not drink alcohol and does not use drugs.  Allergies: No Known Allergies  Medications reviewed.    ROS Full ROS performed and is otherwise negative other than what is stated in HPI   BP 115/79 (BP Location: Right Arm, Patient Position: Sitting, Cuff Size: Small)   Pulse 86   Temp 98.5 F (36.9 C) (Oral)   Ht 5\' 4"  (1.626 m)   Wt 117 lb 3.2 oz (53.2 kg)   SpO2 100%   BMI 20.12 kg/m   Physical Exam CONSTITUTIONAL: NAD. EYES: Pupils are equal,  round, Sclera are non-icteric. EARS, NOSE, MOUTH AND THROAT: The oral mucosa is pink and moist. Hearing is intact to voice. LYMPH NODES:  Lymph nodes in the neck are normal. RESPIRATORY:  Lungs are clear. There is normal respiratory effort, with equal breath sounds bilaterally, and without pathologic use of accessory muscles. CARDIOVASCULAR: Heart is regular without murmurs, gallops, or rubs. Left port in place w/o infection BREAST: I can not longer palpate prior nodule on the Right breast.  The left breast tissue seems to be dense There is no evidence of axillary lymphadenopathy Evidence of prior breast augmentation surgery bilaterally. Right breast is ptotic and is asymmetric compared to the Left side ( right is smaller) GI: The abdomen is  soft, nontender, and nondistended. There are no palpable masses. There is no hepatosplenomegaly. There are normal bowel sounds in all quadrants. GU: Rectal deferred.   MUSCULOSKELETAL: Normal muscle strength and tone. No cyanosis or edema.   SKIN: Turgor is good and there are no pathologic skin lesions or ulcers. NEUROLOGIC: Motor and sensation is grossly normal. Cranial nerves are grossly intact. PSYCH:  Oriented to person, place and time. Affect is normal.    Assessment/Plan: 32 yo female with stage Ib invasive mammary carcinoma of the right breast , triple Negative breast cancer status post neoadjuvant therapy.  She is about to complete chemotherapy cycle.  Tentatively we will schedule her in about 5-6 weeks.  Will plan for bilateral mastectomy likely nipple sparing with reconstruction.  This will be a combined case with plastic surgery.  Procedure discussed with her and her mother in detail.  Risk, benefits and possible implications including but not limited to: Seroma, breast deformity, nipple necrosis, infection, bleeding, pain.  They understand and wish to proceed. Please note I spent 40 minutes in this encounter including personally reviewing medical  records, images studies, coordinating her care, placing orders, counseling the patient and performing documentation.   Sterling Big, MD [facility] General Surgeon

## 2023-03-20 NOTE — Telephone Encounter (Signed)
Patient has been advised of Pre-Admission date/time, and Surgery date at [facility].  Surgery Date: 05/08/23 Preadmission Testing Date: 04/30/23 (phone 8a-1p)  Patient has been made aware to arrive @ 7:30 am @[facility]  for SLN injection to be done before her surgery.    Patient also reminded of her preop visit w/Dr. Ulice Bold 04/10/23 @8 :45 am ([address] in [place], [place]: [phone])  Patient also reminded of her follow up in office with Dr. Everlene Farrier prior to surgery 04/16/23 @8 :45 am

## 2023-03-21 ENCOUNTER — Other Ambulatory Visit: Payer: Self-pay | Admitting: Surgery

## 2023-03-21 DIAGNOSIS — Z9013 Acquired absence of bilateral breasts and nipples: Secondary | ICD-10-CM

## 2023-03-21 MED FILL — Dexamethasone Sodium Phosphate Inj 100 MG/10ML: INTRAMUSCULAR | Qty: 1 | Status: AC

## 2023-03-24 ENCOUNTER — Inpatient Hospital Stay: Payer: Medicaid Other

## 2023-03-24 ENCOUNTER — Inpatient Hospital Stay (HOSPITAL_BASED_OUTPATIENT_CLINIC_OR_DEPARTMENT_OTHER): Payer: Medicaid Other | Admitting: Oncology

## 2023-03-24 ENCOUNTER — Encounter: Payer: Self-pay | Admitting: Oncology

## 2023-03-24 VITALS — BP 100/69 | HR 94

## 2023-03-24 VITALS — BP 113/79 | HR 97 | Temp 97.4°F | Resp 18 | Wt 112.0 lb

## 2023-03-24 DIAGNOSIS — T451X5A Adverse effect of antineoplastic and immunosuppressive drugs, initial encounter: Secondary | ICD-10-CM

## 2023-03-24 DIAGNOSIS — Z5111 Encounter for antineoplastic chemotherapy: Secondary | ICD-10-CM | POA: Diagnosis not present

## 2023-03-24 DIAGNOSIS — R7989 Other specified abnormal findings of blood chemistry: Secondary | ICD-10-CM

## 2023-03-24 DIAGNOSIS — C50411 Malignant neoplasm of upper-outer quadrant of right female breast: Secondary | ICD-10-CM

## 2023-03-24 DIAGNOSIS — G620 Drug-induced polyneuropathy: Secondary | ICD-10-CM | POA: Diagnosis not present

## 2023-03-24 DIAGNOSIS — D701 Agranulocytosis secondary to cancer chemotherapy: Secondary | ICD-10-CM | POA: Diagnosis not present

## 2023-03-24 DIAGNOSIS — Z171 Estrogen receptor negative status [ER-]: Secondary | ICD-10-CM

## 2023-03-24 LAB — CMP (CANCER CENTER ONLY)
ALT: 75 U/L — ABNORMAL HIGH (ref 0–44)
AST: 42 U/L — ABNORMAL HIGH (ref 15–41)
Albumin: 4.1 g/dL (ref 3.5–5.0)
Alkaline Phosphatase: 65 U/L (ref 38–126)
Anion gap: 6 (ref 5–15)
BUN: 12 mg/dL (ref 6–20)
CO2: 24 mmol/L (ref 22–32)
Calcium: 8.8 mg/dL — ABNORMAL LOW (ref 8.9–10.3)
Chloride: 105 mmol/L (ref 98–111)
Creatinine: 0.37 mg/dL — ABNORMAL LOW (ref 0.44–1.00)
GFR, Estimated: >60 mL/min (ref >60)
Glucose, Bld: 96 mg/dL (ref 70–99)
Potassium: 3.8 mmol/L (ref 3.5–5.1)
Sodium: 135 mmol/L (ref 135–145)
Total Bilirubin: 0.6 mg/dL (ref 0.3–1.2)
Total Protein: 7.1 g/dL (ref 6.5–8.1)

## 2023-03-24 LAB — CBC WITH DIFFERENTIAL (CANCER CENTER ONLY)
Abs Immature Granulocytes: 0.01 10*3/uL (ref 0.00–0.07)
Basophils Absolute: 0 10*3/uL (ref 0.0–0.1)
Basophils Relative: 1 %
Eosinophils Absolute: 0.1 10*3/uL (ref 0.0–0.5)
Eosinophils Relative: 2 %
HCT: 32.2 % — ABNORMAL LOW (ref 36.0–46.0)
Hemoglobin: 11 g/dL — ABNORMAL LOW (ref 12.0–15.0)
Immature Granulocytes: 0 %
Lymphocytes Relative: 39 %
Lymphs Abs: 1.3 10*3/uL (ref 0.7–4.0)
MCH: 32 pg (ref 26.0–34.0)
MCHC: 34.2 g/dL (ref 30.0–36.0)
MCV: 93.6 fL (ref 80.0–100.0)
Monocytes Absolute: 0.5 10*3/uL (ref 0.1–1.0)
Monocytes Relative: 14 %
Neutro Abs: 1.5 10*3/uL — ABNORMAL LOW (ref 1.7–7.7)
Neutrophils Relative %: 44 %
Platelet Count: 187 10*3/uL (ref 150–400)
RBC: 3.44 MIL/uL — ABNORMAL LOW (ref 3.87–5.11)
RDW: 15.3 % (ref 11.5–15.5)
WBC Count: 3.4 10*3/uL — ABNORMAL LOW (ref 4.0–10.5)
nRBC: 0 % (ref 0.0–0.2)

## 2023-03-24 LAB — PREGNANCY, URINE: Preg Test, Ur: NEGATIVE

## 2023-03-24 MED ORDER — SODIUM CHLORIDE 0.9 % IV SOLN
10.0000 mg | Freq: Once | INTRAVENOUS | Status: AC
  Administered 2023-03-24: 10 mg via INTRAVENOUS
  Filled 2023-03-24: qty 10

## 2023-03-24 MED ORDER — SODIUM CHLORIDE 0.9 % IV SOLN
60.0000 mg/m2 | Freq: Once | INTRAVENOUS | Status: AC
  Administered 2023-03-24: 96 mg via INTRAVENOUS
  Filled 2023-03-24: qty 16

## 2023-03-24 MED ORDER — PALONOSETRON HCL INJECTION 0.25 MG/5ML
0.2500 mg | Freq: Once | INTRAVENOUS | Status: AC
  Administered 2023-03-24: 0.25 mg via INTRAVENOUS
  Filled 2023-03-24: qty 5

## 2023-03-24 MED ORDER — HEPARIN SOD (PORK) LOCK FLUSH 100 UNIT/ML IV SOLN
500.0000 [IU] | Freq: Once | INTRAVENOUS | Status: AC | PRN
  Administered 2023-03-24: 500 [IU]
  Filled 2023-03-24: qty 5

## 2023-03-24 MED ORDER — SODIUM CHLORIDE 0.9 % IV SOLN
Freq: Once | INTRAVENOUS | Status: AC
  Filled 2023-03-24: qty 250

## 2023-03-24 MED ORDER — FAMOTIDINE IN NACL 20-0.9 MG/50ML-% IV SOLN
20.0000 mg | Freq: Once | INTRAVENOUS | Status: AC
  Administered 2023-03-24: 20 mg via INTRAVENOUS
  Filled 2023-03-24: qty 50

## 2023-03-24 MED ORDER — SODIUM CHLORIDE 0.9 % IV SOLN
171.6000 mg | Freq: Once | INTRAVENOUS | Status: AC
  Administered 2023-03-24: 170 mg via INTRAVENOUS
  Filled 2023-03-24: qty 17

## 2023-03-24 MED ORDER — DIPHENHYDRAMINE HCL 50 MG/ML IJ SOLN
25.0000 mg | Freq: Once | INTRAMUSCULAR | Status: AC
  Administered 2023-03-24: 25 mg via INTRAVENOUS
  Filled 2023-03-24: qty 1

## 2023-03-24 NOTE — Progress Notes (Signed)
Hematology/Oncology Consult note [facility]  Telephone:([phone] Fax:([phone]) [phone]  Patient Care Team: Glori Luis, MD as PCP - General (Family Medicine) Hulen Luster, RN as Oncology Nurse Navigator Creig Hines, MD as Consulting Physician (Oncology)   Name of the patient: Eileen Hart  [id number]  Feb 18, 1991   Date of visit: 03/24/23  Diagnosis- clinical prognostic stage Ib invasive mammary carcinoma of the right breast cT1 cN0 M0 triple negative   Chief complaint/ Reason for visit-on treatment assessment prior to cycle 10 of neoadjuvant CarboTaxol chemotherapy  Heme/Onc history: Patient is a 32 year old female who underwent a bilateral diagnostic mammogram on 10/07/2022 after she felt a palpable area of concern in her right breast. Mammogram showed a 1.6 x 1 x 1.5 cm solid mass in the right breast 9:30 position 5 cm from the nipple. There was also an adjacent 6 x 9 x 2 mm mass in the right breast. No right axillary adenopathy. The dominant mass was biopsied and was consistent with invasive mammary carcinoma grade 3 ER and HER2 negative.    Bilateral breast MRI showed additional areas of concern in the right breast.  2 additional enhancing masses 1 in the 12 o'clock position and 1 in the 4 o'clock position.  2 adjacent prominent right axillary lymph nodes.  2 intermittent masses in the left breast 9 o'clock position and upper outer left breast.  This was followed by a dedicated ultrasound.  She had 3 breast biopsies in the right side and 1 left breast biopsy and all of them were negative for malignancy.  On the ultrasound her right axillary lymph nodes appeared normal and therefore biopsy was not recommended for the same.   PET CT scan showed focal hypermetabolic activity in the right breast adjacent to the biopsy clip but no other additional areas of concern.  Baseline MUGA scan showed normal EF.   Given that additional biopsies were negative for  malignancy and the only biopsy-proven site was 1.6 cm triple negative breast cancer with negative lymph nodes plan was to do [facility] Taxol chemotherapy neoadjuvant without opting for keynote 522 regimen.    Mild decrease in the size of primary breast mass after 4 cycles of AC chemotherapy. Carboplatin added to taxol neoadjuvantly  Interval history-neuropathy is currently stable.  She reports mild persistent numbness in her extremities.  No significant pain.  She has been able to carry on ADLs and IADLs.  ECOG PS- 0 Pain scale- 0   Review of systems- Review of Systems  Constitutional:  Negative for chills, fever, malaise/fatigue and weight loss.  HENT:  Negative for congestion, ear discharge and nosebleeds.   Eyes:  Negative for blurred vision.  Respiratory:  Negative for cough, hemoptysis, sputum production, shortness of breath and wheezing.   Cardiovascular:  Negative for chest pain, palpitations, orthopnea and claudication.  Gastrointestinal:  Negative for abdominal pain, blood in stool, constipation, diarrhea, heartburn, melena, nausea and vomiting.  Genitourinary:  Negative for dysuria, flank pain, frequency, hematuria and urgency.  Musculoskeletal:  Negative for back pain, joint pain and myalgias.  Skin:  Negative for rash.  Neurological:  Positive for sensory change (Peripheral neuropathy). Negative for dizziness, tingling, focal weakness, seizures, weakness and headaches.  Endo/Heme/Allergies:  Does not bruise/bleed easily.  Psychiatric/Behavioral:  Negative for depression and suicidal ideas. The patient does not have insomnia.       No Known Allergies   Past Medical History:  Diagnosis Date   Chlamydia 01/25/2010  Depression with anxiety    Gestational hypertension    Immunization, viral disease    gardasil series completed   Tobacco abuse 05/02/2016     Past Surgical History:  Procedure Laterality Date   AUGMENTATION MAMMAPLASTY Bilateral    saline implants 2015    BREAST BIOPSY Right 10/14/2022   US biopsy/ coil clip/ path pending   BREAST BIOPSY Right 10/14/2022   [place] RT BREAST BX W LOC DEV 1ST LESION IMG BX SPEC US GUIDE 10/14/2022 [facility]   BREAST BIOPSY Left 10/29/2022   [place] Core Bx Ribbon clip- path pending   BREAST BIOPSY Right 10/29/2022   [place] Core 1130 Venus Clip- path pending   BREAST BIOPSY Right 10/29/2022   [place] Core Bx Retroareolar heart clip-path pending   BREAST BIOPSY Right 10/29/2022   [place] Core Bx Ribbon Clip path pending   BREAST BIOPSY Right 10/29/2022   [place] RT BREAST BX W LOC DEV EA ADD LESION IMG BX SPEC US GUIDE 10/29/2022 [facility]   BREAST BIOPSY Right 10/29/2022   [place] RT BREAST BX W LOC DEV 1ST LESION IMG BX SPEC US GUIDE 10/29/2022 [facility]   BREAST BIOPSY Right 10/29/2022   [place] RT BREAST BX W LOC DEV EA ADD LESION IMG BX SPEC US GUIDE 10/29/2022 [facility]   BREAST BIOPSY Left 10/29/2022   [place] LT BREAST BX W LOC DEV 1ST LESION IMG BX SPEC US GUIDE 10/29/2022 [facility]   BREAST ENHANCEMENT SURGERY  2015   CESAREAN SECTION  2014   PORTACATH PLACEMENT N/A 10/24/2022   Procedure: INSERTION PORT-A-CATH;  Surgeon: Leafy Ro, MD;  Location: [facility];  Service: General;  Laterality: N/A;   TONSILLECTOMY      Social History   Socioeconomic History   Marital status: Single    Spouse name: Not on file   Number of children: 1   Years of education: 15   Highest education level: Not on file  Occupational History   Occupation: Groomer    Comment: [employer]  Tobacco Use   Smoking status: Former    Current packs/day: 0.00    Types: Cigarettes    Quit date: 07/25/2018    Years since quitting: 4.6   Smokeless tobacco: Never   Tobacco comments:    quit 07/2018  Vaping Use   Vaping status: Never Used  Substance and Sexual Activity   Alcohol use: No    Alcohol/week: 0.0 standard drinks of alcohol   Drug use: No   Sexual activity: Yes    Birth control/protection: Pill  Other Topics Concern   Not  on file  Social History Narrative   Not on file   Social Determinants of Health   Financial Resource Strain: Not on file  Food Insecurity: No Food Insecurity (10/22/2022)   Hunger Vital Sign    Worried About Running Out of Food in the Last Year: Never true    Ran Out of Food in the Last Year: Never true  Transportation Needs: No Transportation Needs (10/22/2022)   PRAPARE - Administrator, Civil Service (Medical): No    Lack of Transportation (Non-Medical): No  Physical Activity: Not on file  Stress: Not on file  Social Connections: Not on file  Intimate Partner Violence: Not At Risk (10/22/2022)   Humiliation, Afraid, Rape, and Kick questionnaire    Fear of Current or Ex-Partner: No    Emotionally Abused: No    Physically Abused: No    Sexually Abused: No    Family History  Problem Relation Age of Onset   Breast cancer Other 35       mat 2nd cousin   Diabetes Neg Hx    Heart disease Neg Hx    Hypertension Neg Hx    Ovarian cancer Neg Hx    Colon cancer Neg Hx      Current Outpatient Medications:    dexamethasone (DECADRON) 4 MG tablet, Take 2 tablets (8 mg total) by mouth daily for 3 days. Start the day after doxorubicin/cyclophosphamide chemotherapy. Take with food., Disp: 30 tablet, Rfl: 1   lidocaine-prilocaine (EMLA) cream, Apply to affected area once, Disp: 30 g, Rfl: 3   ondansetron (ZOFRAN) 8 MG tablet, Take 1 tab (8 mg) by mouth every 8 hrs as needed for nausea/vomiting. Start third day after doxorubicin/cyclophosphamide chemotherapy., Disp: 30 tablet, Rfl: 1   prochlorperazine (COMPAZINE) 10 MG tablet, Take 1 tablet (10 mg total) by mouth every 6 (six) hours as needed for nausea or vomiting., Disp: 30 tablet, Rfl: 1 No current facility-administered medications for this visit.  Facility-Administered Medications Ordered in Other Visits:    CARBOplatin (PARAPLATIN) 170 mg in sodium chloride 0.9 % 100 mL chemo infusion, 170 mg, Intravenous, Once, Creig Hines, MD   famotidine (PEPCID) IVPB 20 mg premix, 20 mg, Intravenous, Once, Creig Hines, MD, Last Rate: 200 mL/hr at 03/24/23 0938, 20 mg at 03/24/23 3244   PACLitaxel (TAXOL) 96 mg in sodium chloride 0.9 % 250 mL chemo infusion (</= 80mg /m2), 60 mg/m2 (Treatment Plan Recorded), Intravenous, Once, Creig Hines, MD  Physical exam:  Vitals:   03/24/23 0841  BP: 113/79  Pulse: 97  Resp: 18  Temp: (!) 97.4 F (36.3 C)  TempSrc: Tympanic  SpO2: 100%  Weight: 112 lb (50.8 kg)   Physical Exam Cardiovascular:     Rate and Rhythm: Normal rate and regular rhythm.     Heart sounds: Normal heart sounds.  Pulmonary:     Effort: Pulmonary effort is normal.     Breath sounds: Normal breath sounds.  Abdominal:     General: Bowel sounds are normal.     Palpations: Abdomen is soft.  Skin:    General: Skin is warm and dry.  Neurological:     Mental Status: She is alert and oriented to person, place, and time.         Latest Ref Rng & Units 03/24/2023    8:28 AM  CMP  Glucose 70 - 99 mg/dL 96   BUN 6 - 20 mg/dL 12   Creatinine 0.10 - 1.00 mg/dL 2.72   Sodium 536 - 644 mmol/L 135   Potassium 3.5 - 5.1 mmol/L 3.8   Chloride 98 - 111 mmol/L 105   CO2 22 - 32 mmol/L 24   Calcium 8.9 - 10.3 mg/dL 8.8   Total Protein 6.5 - 8.1 g/dL 7.1   Total Bilirubin 0.3 - 1.2 mg/dL 0.6   Alkaline Phos 38 - 126 U/L 65   AST 15 - 41 U/L 42   ALT 0 - 44 U/L 75       Latest Ref Rng & Units 03/24/2023    8:28 AM  CBC  WBC 4.0 - 10.5 K/uL 3.4   Hemoglobin 12.0 - 15.0 g/dL 03.4   Hematocrit 74.2 - 46.0 % 32.2   Platelets 150 - 400 K/uL 187      Assessment and plan- Patient is a 32 y.o. female with clinically prognostic stage Ib invasive mammary carcinoma of the right breast  cT1c CN0CM0 triple negative. She is s/p 4 cycles of neoadjuvant AC chemotherapy.  She is here for on treatment assessment prior to cycle 11 of weekly CarboTaxol chemotherapy  White count is 3.4 today with an ANC of  1.5.AST and ALT mildly elevated at 42 and 75 likely due to Taxol which we will continue to monitor.  Counts otherwise okay to proceed with cycle 11 of weekly CarboTaxol chemotherapy today.  She will directly proceed for cycle 12 of treatment next week which will be her last cycle.  She will be receiving 3 days of Zarzio with this cycle.    She is scheduled to undergo bilateral mastectomy with reconstruction on 05/08/2023.  I will see her back on 05/22/2023 to discuss final pathology results and further management.  Her port can be taken out at the time of surgery.  If she has residual disease after surgery, I will consider adjuvant Xeloda for her which would be an oral medication.  Depending on final pathology we will determine the need for adjuvant radiation  Chemo-induced peripheral neuropathy: She is receiving reduced dose of Taxol at 60 mg/m.  We discussed various treatment options for neuropathy including gabapentin versus Lyrica versus duloxetine.  She would like to see how her symptoms do over the next few weeks and let me know if she wants to start any treatment.  Abnormal LFTs: Mild secondary to Taxol likely.  Continue to monitor   Visit Diagnosis 1. Encounter for antineoplastic chemotherapy   2. Malignant neoplasm of upper-outer quadrant of right breast in female, estrogen receptor negative (HCC)   3. Chemotherapy induced neutropenia (HCC)   4. Chemotherapy-induced peripheral neuropathy (HCC)   5. Abnormal LFTs      Dr. Owens Shark, MD, MPH [facility] [phone] 03/24/2023 9:40 AM

## 2023-03-24 NOTE — Patient Instructions (Signed)
[facility]  Discharge Instructions: Thank you for choosing [facility] to provide your oncology and hematology care.  If you have a lab appointment with the Cancer Center, please go directly to the Cancer Center and check in at the registration area.  Wear comfortable clothing and clothing appropriate for easy access to any Portacath or PICC line.   We strive to give you quality time with your provider. You may need to reschedule your appointment if you arrive late (15 or more minutes).  Arriving late affects you and other patients whose appointments are after yours.  Also, if you miss three or more appointments without notifying the office, you may be dismissed from the clinic at the provider's discretion.      For prescription refill requests, have your pharmacy contact our office and allow 72 hours for refills to be completed.    Today you received the following chemotherapy and/or immunotherapy agents Taxol & Carboplatin    To help prevent nausea and vomiting after your treatment, we encourage you to take your nausea medication as directed.  BELOW ARE SYMPTOMS THAT SHOULD BE REPORTED IMMEDIATELY: *FEVER GREATER THAN 100.4 F (38 C) OR HIGHER *CHILLS OR SWEATING *NAUSEA AND VOMITING THAT IS NOT CONTROLLED WITH YOUR NAUSEA MEDICATION *UNUSUAL SHORTNESS OF BREATH *UNUSUAL BRUISING OR BLEEDING *URINARY PROBLEMS (pain or burning when urinating, or frequent urination) *BOWEL PROBLEMS (unusual diarrhea, constipation, pain near the anus) TENDERNESS IN MOUTH AND THROAT WITH OR WITHOUT PRESENCE OF ULCERS (sore throat, sores in mouth, or a toothache) UNUSUAL RASH, SWELLING OR PAIN  UNUSUAL VAGINAL DISCHARGE OR ITCHING   Items with * indicate a potential emergency and should be followed up as soon as possible or go to the Emergency Department if any problems should occur.  Please show the CHEMOTHERAPY ALERT CARD or IMMUNOTHERAPY ALERT CARD at  check-in to the Emergency Department and triage nurse.  Should you have questions after your visit or need to cancel or reschedule your appointment, please contact [facility]  [phone] and follow the prompts.  Office hours are 8:00 a.m. to 4:30 p.m. Monday - Friday. Please note that voicemails left after 4:00 p.m. may not be returned until the following business day.  We are closed weekends and major holidays. You have access to a nurse at all times for urgent questions. Please call the main number to the clinic [phone] and follow the prompts.  For any non-urgent questions, you may also contact your provider using MyChart. We now offer e-Visits for anyone 66 and older to request care online for non-urgent symptoms. For details visit [email or web address].   Also download the MyChart app! Go to the app store, search "MyChart", open the app, select [facility], and log in with your MyChart username and password.

## 2023-03-25 ENCOUNTER — Encounter: Payer: Self-pay | Admitting: Oncology

## 2023-03-25 ENCOUNTER — Inpatient Hospital Stay: Payer: Medicaid Other | Attending: Oncology

## 2023-03-25 DIAGNOSIS — C50811 Malignant neoplasm of overlapping sites of right female breast: Secondary | ICD-10-CM | POA: Diagnosis present

## 2023-03-25 DIAGNOSIS — Z5189 Encounter for other specified aftercare: Secondary | ICD-10-CM | POA: Insufficient documentation

## 2023-03-25 DIAGNOSIS — Z171 Estrogen receptor negative status [ER-]: Secondary | ICD-10-CM

## 2023-03-25 DIAGNOSIS — Z5111 Encounter for antineoplastic chemotherapy: Secondary | ICD-10-CM | POA: Insufficient documentation

## 2023-03-25 DIAGNOSIS — Z79899 Other long term (current) drug therapy: Secondary | ICD-10-CM | POA: Diagnosis not present

## 2023-03-25 MED ORDER — FILGRASTIM-AAFI 300 MCG/0.5ML IJ SOSY
300.0000 ug | PREFILLED_SYRINGE | Freq: Every day | INTRAMUSCULAR | Status: DC
  Administered 2023-03-25: 300 ug via SUBCUTANEOUS
  Filled 2023-03-25: qty 0.5

## 2023-03-26 ENCOUNTER — Inpatient Hospital Stay: Payer: Medicaid Other

## 2023-03-26 DIAGNOSIS — C50411 Malignant neoplasm of upper-outer quadrant of right female breast: Secondary | ICD-10-CM

## 2023-03-26 DIAGNOSIS — Z5111 Encounter for antineoplastic chemotherapy: Secondary | ICD-10-CM | POA: Diagnosis not present

## 2023-03-26 MED ORDER — FILGRASTIM-AAFI 300 MCG/0.5ML IJ SOSY
300.0000 ug | PREFILLED_SYRINGE | Freq: Every day | INTRAMUSCULAR | Status: DC
  Administered 2023-03-26: 300 ug via SUBCUTANEOUS
  Filled 2023-03-26: qty 0.5

## 2023-03-27 ENCOUNTER — Inpatient Hospital Stay: Payer: Medicaid Other

## 2023-03-27 DIAGNOSIS — Z171 Estrogen receptor negative status [ER-]: Secondary | ICD-10-CM

## 2023-03-27 DIAGNOSIS — Z5111 Encounter for antineoplastic chemotherapy: Secondary | ICD-10-CM | POA: Diagnosis not present

## 2023-03-27 MED ORDER — FILGRASTIM-AAFI 300 MCG/0.5ML IJ SOSY
300.0000 ug | PREFILLED_SYRINGE | Freq: Every day | INTRAMUSCULAR | Status: DC
  Administered 2023-03-27: 300 ug via SUBCUTANEOUS
  Filled 2023-03-27: qty 0.5

## 2023-03-28 MED FILL — Dexamethasone Sodium Phosphate Inj 100 MG/10ML: INTRAMUSCULAR | Qty: 1 | Status: AC

## 2023-03-31 ENCOUNTER — Encounter: Payer: Self-pay | Admitting: *Deleted

## 2023-03-31 ENCOUNTER — Inpatient Hospital Stay: Payer: Medicaid Other

## 2023-03-31 ENCOUNTER — Other Ambulatory Visit: Payer: Self-pay | Admitting: Oncology

## 2023-03-31 VITALS — BP 112/79 | HR 84 | Temp 99.1°F | Resp 16 | Wt 117.7 lb

## 2023-03-31 DIAGNOSIS — Z5111 Encounter for antineoplastic chemotherapy: Secondary | ICD-10-CM | POA: Diagnosis not present

## 2023-03-31 DIAGNOSIS — Z171 Estrogen receptor negative status [ER-]: Secondary | ICD-10-CM

## 2023-03-31 LAB — CBC WITH DIFFERENTIAL (CANCER CENTER ONLY)
Abs Immature Granulocytes: 0.03 10*3/uL (ref 0.00–0.07)
Basophils Absolute: 0 10*3/uL (ref 0.0–0.1)
Basophils Relative: 1 %
Eosinophils Absolute: 0.1 10*3/uL (ref 0.0–0.5)
Eosinophils Relative: 2 %
HCT: 33.9 % — ABNORMAL LOW (ref 36.0–46.0)
Hemoglobin: 11.4 g/dL — ABNORMAL LOW (ref 12.0–15.0)
Immature Granulocytes: 1 %
Lymphocytes Relative: 42 %
Lymphs Abs: 1.6 10*3/uL (ref 0.7–4.0)
MCH: 31.8 pg (ref 26.0–34.0)
MCHC: 33.6 g/dL (ref 30.0–36.0)
MCV: 94.4 fL (ref 80.0–100.0)
Monocytes Absolute: 0.4 10*3/uL (ref 0.1–1.0)
Monocytes Relative: 12 %
Neutro Abs: 1.6 10*3/uL — ABNORMAL LOW (ref 1.7–7.7)
Neutrophils Relative %: 42 %
Platelet Count: 235 10*3/uL (ref 150–400)
RBC: 3.59 MIL/uL — ABNORMAL LOW (ref 3.87–5.11)
RDW: 15.4 % (ref 11.5–15.5)
WBC Count: 3.7 10*3/uL — ABNORMAL LOW (ref 4.0–10.5)
nRBC: 0 % (ref 0.0–0.2)

## 2023-03-31 LAB — CMP (CANCER CENTER ONLY)
ALT: 36 U/L (ref 0–44)
AST: 27 U/L (ref 15–41)
Albumin: 4.2 g/dL (ref 3.5–5.0)
Alkaline Phosphatase: 70 U/L (ref 38–126)
Anion gap: 7 (ref 5–15)
BUN: 8 mg/dL (ref 6–20)
CO2: 24 mmol/L (ref 22–32)
Calcium: 8.9 mg/dL (ref 8.9–10.3)
Chloride: 104 mmol/L (ref 98–111)
Creatinine: 0.6 mg/dL (ref 0.44–1.00)
GFR, Estimated: >60 mL/min (ref >60)
Glucose, Bld: 106 mg/dL — ABNORMAL HIGH (ref 70–99)
Potassium: 3.8 mmol/L (ref 3.5–5.1)
Sodium: 135 mmol/L (ref 135–145)
Total Bilirubin: 0.3 mg/dL (ref 0.3–1.2)
Total Protein: 7 g/dL (ref 6.5–8.1)

## 2023-03-31 MED ORDER — SODIUM CHLORIDE 0.9% FLUSH
10.0000 mL | INTRAVENOUS | Status: DC | PRN
  Administered 2023-03-31: 10 mL
  Filled 2023-03-31: qty 10

## 2023-03-31 MED ORDER — DIPHENHYDRAMINE HCL 50 MG/ML IJ SOLN
25.0000 mg | Freq: Once | INTRAMUSCULAR | Status: AC
  Administered 2023-03-31: 25 mg via INTRAVENOUS
  Filled 2023-03-31: qty 1

## 2023-03-31 MED ORDER — GABAPENTIN 300 MG PO CAPS: 300.0000 mg | ORAL_CAPSULE | Freq: Three times a day (TID) | ORAL | 3 refills | Status: DC

## 2023-03-31 MED ORDER — SODIUM CHLORIDE 0.9 % IV SOLN
171.6000 mg | Freq: Once | INTRAVENOUS | Status: AC
  Administered 2023-03-31: 170 mg via INTRAVENOUS
  Filled 2023-03-31: qty 17

## 2023-03-31 MED ORDER — HEPARIN SOD (PORK) LOCK FLUSH 100 UNIT/ML IV SOLN
500.0000 [IU] | Freq: Once | INTRAVENOUS | Status: AC | PRN
  Administered 2023-03-31: 500 [IU]
  Filled 2023-03-31: qty 5

## 2023-03-31 MED ORDER — SODIUM CHLORIDE 0.9 % IV SOLN
10.0000 mg | Freq: Once | INTRAVENOUS | Status: AC
  Administered 2023-03-31: 10 mg via INTRAVENOUS
  Filled 2023-03-31: qty 10

## 2023-03-31 MED ORDER — SODIUM CHLORIDE 0.9 % IV SOLN
Freq: Once | INTRAVENOUS | Status: AC
  Filled 2023-03-31: qty 250

## 2023-03-31 MED ORDER — FAMOTIDINE IN NACL 20-0.9 MG/50ML-% IV SOLN
20.0000 mg | Freq: Once | INTRAVENOUS | Status: AC
  Administered 2023-03-31: 20 mg via INTRAVENOUS
  Filled 2023-03-31: qty 50

## 2023-03-31 MED ORDER — PALONOSETRON HCL INJECTION 0.25 MG/5ML
0.2500 mg | Freq: Once | INTRAVENOUS | Status: AC
  Administered 2023-03-31: 0.25 mg via INTRAVENOUS
  Filled 2023-03-31: qty 5

## 2023-03-31 MED ORDER — SODIUM CHLORIDE 0.9 % IV SOLN
60.0000 mg/m2 | Freq: Once | INTRAVENOUS | Status: AC
  Administered 2023-03-31: 96 mg via INTRAVENOUS
  Filled 2023-03-31: qty 16

## 2023-03-31 NOTE — Patient Instructions (Signed)
[facility] [place] [facility]  Discharge Instructions: Thank you for choosing [facility] to provide your oncology and hematology care.  If you have a lab appointment with the Cancer Center, please go directly to the Cancer Center and check in at the registration area.  Wear comfortable clothing and clothing appropriate for easy access to any Portacath or PICC line.   We strive to give you quality time with your provider. You may need to reschedule your appointment if you arrive late (15 or more minutes).  Arriving late affects you and other patients whose appointments are after yours.  Also, if you miss three or more appointments without notifying the office, you may be dismissed from the clinic at the provider's discretion.      For prescription refill requests, have your pharmacy contact our office and allow 72 hours for refills to be completed.    Today you received the following chemotherapy and/or immunotherapy agents- taxol, carboplatin      To help prevent nausea and vomiting after your treatment, we encourage you to take your nausea medication as directed.  BELOW ARE SYMPTOMS THAT SHOULD BE REPORTED IMMEDIATELY: *FEVER GREATER THAN 100.4 F (38 C) OR HIGHER *CHILLS OR SWEATING *NAUSEA AND VOMITING THAT IS NOT CONTROLLED WITH YOUR NAUSEA MEDICATION *UNUSUAL SHORTNESS OF BREATH *UNUSUAL BRUISING OR BLEEDING *URINARY PROBLEMS (pain or burning when urinating, or frequent urination) *BOWEL PROBLEMS (unusual diarrhea, constipation, pain near the anus) TENDERNESS IN MOUTH AND THROAT WITH OR WITHOUT PRESENCE OF ULCERS (sore throat, sores in mouth, or a toothache) UNUSUAL RASH, SWELLING OR PAIN  UNUSUAL VAGINAL DISCHARGE OR ITCHING   Items with * indicate a potential emergency and should be followed up as soon as possible or go to the Emergency Department if any problems should occur.  Please show the CHEMOTHERAPY ALERT CARD or IMMUNOTHERAPY ALERT CARD at  check-in to the Emergency Department and triage nurse.  Should you have questions after your visit or need to cancel or reschedule your appointment, please contact [facility]  [phone] and follow the prompts.  Office hours are 8:00 a.m. to 4:30 p.m. Monday - Friday. Please note that voicemails left after 4:00 p.m. may not be returned until the following business day.  We are closed weekends and major holidays. You have access to a nurse at all times for urgent questions. Please call the main number to the clinic [phone] and follow the prompts.  For any non-urgent questions, you may also contact your provider using MyChart. We now offer e-Visits for anyone 18 and older to request care online for non-urgent symptoms. For details visit [email or web address].   Also download the MyChart app! Go to the app store, search "MyChart", open the app, select [facility], and log in with your MyChart username and password.    

## 2023-04-10 ENCOUNTER — Ambulatory Visit: Payer: Medicaid Other | Admitting: Surgical

## 2023-04-10 ENCOUNTER — Encounter: Payer: Self-pay | Admitting: Surgical

## 2023-04-10 VITALS — BP 111/77 | HR 96

## 2023-04-10 DIAGNOSIS — C50919 Malignant neoplasm of unspecified site of unspecified female breast: Secondary | ICD-10-CM

## 2023-04-10 DIAGNOSIS — Z1379 Encounter for other screening for genetic and chromosomal anomalies: Secondary | ICD-10-CM

## 2023-04-10 DIAGNOSIS — Z171 Estrogen receptor negative status [ER-]: Secondary | ICD-10-CM

## 2023-04-10 DIAGNOSIS — C50411 Malignant neoplasm of upper-outer quadrant of right female breast: Secondary | ICD-10-CM

## 2023-04-10 DIAGNOSIS — N6310 Unspecified lump in the right breast, unspecified quadrant: Secondary | ICD-10-CM

## 2023-04-10 MED ORDER — CEPHALEXIN 500 MG PO CAPS: 500.0000 mg | ORAL_CAPSULE | Freq: Four times a day (QID) | ORAL | 0 refills | Status: AC

## 2023-04-10 MED ORDER — DIAZEPAM 2 MG PO TABS: 2.0000 mg | ORAL_TABLET | Freq: Two times a day (BID) | ORAL | 0 refills | Status: DC | PRN

## 2023-04-10 MED ORDER — OXYCODONE HCL 5 MG PO TABS
5.0000 mg | ORAL_TABLET | Freq: Four times a day (QID) | ORAL | 0 refills | Status: AC | PRN
Start: 2023-04-10 — End: 2023-04-15

## 2023-04-10 NOTE — Progress Notes (Signed)
Patient ID: Eileen Hart, female    DOB: July 31, 1990, 32 y.o.   MRN: [id number]  Chief Complaint  Patient presents with   Pre-op Exam      ICD-10-CM   1. Malignant neoplasm of upper-outer quadrant of right breast in female, estrogen receptor negative (HCC)  C50.411    Z17.1     2. Invasive carcinoma of breast (HCC)  C50.919     3. Genetic testing  Z13.79     4. Mass of right breast, unspecified quadrant  N63.10       History of Present Illness: Eileen Hart is a 32 y.o.  female  with a history of right breast cancer.  She presents for preoperative evaluation for upcoming procedure, Bilateral immediate breast reconstruction with expanders and Flex HD placement , scheduled for 05/08/23 with Dr.  Ulice Bold  The patient has not had problems with anesthesia. No history of DVT/PE.  No family history of DVT/PE.  No family or personal history of bleeding or clotting disorders.  Patient is not currently taking any blood thinners.  No history of CVA/MI.   Summary of Previous Visit: Patient with invasive mammary carcinoma, grade 3.  She is receiving chemotherapy.  She has saline implants in place, 350 cc.  Placed in 2015. She has a Port-A-Cath in place.  Job: Does not require any forms  Past medical history: Bilateral breast augmentation approximately 10 years ago.  Chemotherapy/radiation: Completed chemotherapy 2 weeks ago.  No plans for radiation unless positive nodes per patient.  She has a history of bilateral breast augmentation, saline implants in place.  She reports the right breast implant is ruptured from the biopsy site.  She believes the implants are under her muscle.  Past Medical History: Allergies: No Known Allergies  Current Medications:  Current Outpatient Medications:    dexamethasone (DECADRON) 4 MG tablet, Take 2 tablets (8 mg total) by mouth daily for 3 days. Start the day after doxorubicin/cyclophosphamide chemotherapy. Take with food., Disp: 30 tablet,  Rfl: 1   gabapentin (NEURONTIN) 300 MG capsule, Take 1 capsule (300 mg total) by mouth 3 (three) times daily., Disp: 90 capsule, Rfl: 3   lidocaine-prilocaine (EMLA) cream, Apply to affected area once, Disp: 30 g, Rfl: 3   ondansetron (ZOFRAN) 8 MG tablet, Take 1 tab (8 mg) by mouth every 8 hrs as needed for nausea/vomiting. Start third day after doxorubicin/cyclophosphamide chemotherapy., Disp: 30 tablet, Rfl: 1   prochlorperazine (COMPAZINE) 10 MG tablet, Take 1 tablet (10 mg total) by mouth every 6 (six) hours as needed for nausea or vomiting., Disp: 30 tablet, Rfl: 1  Past Medical Problems: Past Medical History:  Diagnosis Date   Chlamydia 01/25/2010   Depression with anxiety    Gestational hypertension    Immunization, viral disease    gardasil series completed   Tobacco abuse 05/02/2016    Past Surgical History: Past Surgical History:  Procedure Laterality Date   AUGMENTATION MAMMAPLASTY Bilateral    saline implants 2015   BREAST BIOPSY Right 10/14/2022   US biopsy/ coil clip/ path pending   BREAST BIOPSY Right 10/14/2022   [place] RT BREAST BX W LOC DEV 1ST LESION IMG BX SPEC US GUIDE 10/14/2022 [facility]   BREAST BIOPSY Left 10/29/2022   [place] Core Bx Ribbon clip- path pending   BREAST BIOPSY Right 10/29/2022   [place] Core 1130 Venus Clip- path pending   BREAST BIOPSY Right 10/29/2022   [place] Core Bx Retroareolar heart clip-path pending  BREAST BIOPSY Right 10/29/2022   [place] Core Bx Ribbon Clip path pending   BREAST BIOPSY Right 10/29/2022   [place] RT BREAST BX W LOC DEV EA ADD LESION IMG BX SPEC US GUIDE 10/29/2022 [facility]   BREAST BIOPSY Right 10/29/2022   [place] RT BREAST BX W LOC DEV 1ST LESION IMG BX SPEC US GUIDE 10/29/2022 [facility]   BREAST BIOPSY Right 10/29/2022   [place] RT BREAST BX W LOC DEV EA ADD LESION IMG BX SPEC US GUIDE 10/29/2022 [facility]   BREAST BIOPSY Left 10/29/2022   [place] LT BREAST BX W LOC DEV 1ST LESION IMG BX SPEC US GUIDE 10/29/2022 [facility]    BREAST ENHANCEMENT SURGERY  2015   CESAREAN SECTION  2014   PORTACATH PLACEMENT N/A 10/24/2022   Procedure: INSERTION PORT-A-CATH;  Surgeon: Leafy Ro, MD;  Location: [facility];  Service: General;  Laterality: N/A;   TONSILLECTOMY      Social History: Social History   Socioeconomic History   Marital status: Single    Spouse name: Not on file   Number of children: 1   Years of education: 15   Highest education level: Not on file  Occupational History   Occupation: Groomer    Comment: [employer]  Tobacco Use   Smoking status: Former    Current packs/day: 0.00    Types: Cigarettes    Quit date: 07/25/2018    Years since quitting: 4.7   Smokeless tobacco: Never   Tobacco comments:    quit 07/2018  Vaping Use   Vaping status: Never Used  Substance and Sexual Activity   Alcohol use: No    Alcohol/week: 0.0 standard drinks of alcohol   Drug use: No   Sexual activity: Yes    Birth control/protection: Pill  Other Topics Concern   Not on file  Social History Narrative   Not on file   Social Determinants of Health   Financial Resource Strain: Not on file  Food Insecurity: No Food Insecurity (10/22/2022)   Hunger Vital Sign    Worried About Running Out of Food in the Last Year: Never true    Ran Out of Food in the Last Year: Never true  Transportation Needs: No Transportation Needs (10/22/2022)   PRAPARE - Administrator, Civil Service (Medical): No    Lack of Transportation (Non-Medical): No  Physical Activity: Not on file  Stress: Not on file  Social Connections: Not on file  Intimate Partner Violence: Not At Risk (10/22/2022)   Humiliation, Afraid, Rape, and Kick questionnaire    Fear of Current or Ex-Partner: No    Emotionally Abused: No    Physically Abused: No    Sexually Abused: No    Family History: Family History  Problem Relation Age of Onset   Breast cancer Other 37       mat 2nd cousin   Diabetes Neg Hx    Heart disease Neg Hx     Hypertension Neg Hx    Ovarian cancer Neg Hx    Colon cancer Neg Hx     Review of Systems: Review of Systems  Constitutional: Negative.   Respiratory: Negative.    Cardiovascular: Negative.   Gastrointestinal: Negative.   Genitourinary: Negative.   Neurological: Negative.     Physical Exam: Vital Signs BP 111/77 (BP Location: Left Arm, Patient Position: Sitting, Cuff Size: Small)   Pulse 96   SpO2 97%   Physical Exam Constitutional:      General: Not in  acute distress.    Appearance: Normal appearance. Not ill-appearing.  HENT:     Head: Normocephalic and atraumatic.  Eyes:     Pupils: Pupils are equal, round Neck:     Musculoskeletal: Normal range of motion.  Cardiovascular:     Rate and Rhythm: Normal rate    Pulses: Normal pulses.  Pulmonary:     Effort: Pulmonary effort is normal. No respiratory distress.  Musculoskeletal: Normal range of motion.  Skin:    General: Skin is warm and dry.     Findings: No erythema or rash.  Neurological:     General: No focal deficit present.     Mental Status: Alert and oriented to person, place, and time. Mental status is at baseline.     Motor: No weakness.  Psychiatric:        Mood and Affect: Mood normal.        Behavior: Behavior normal.    Assessment/Plan: The patient is scheduled for immediate bilateral breast reconstruction placement tissue expanders and Flex HD with Dr. Ulice Bold.  Risks, benefits, and alternatives of procedure discussed, questions answered and consent obtained.    Smoking Status: Non-smoker; Counseling Given?  N/A  Caprini Score: 6; Risk Factors include: Right breast cancer, Port-A-Cath in place, and length of planned surgery. Recommendation for mechanical prophylaxis. Encourage early ambulation.   Pictures obtained: @consult   Post-op Rx sent to pharmacy: Oxycodone, Keflex, Valium  Patient was provided with the breast reconstruction and General Surgical Risk consent document and Pain  Medication Agreement prior to their appointment.  They had adequate time to read through the risk consent documents and Pain Medication Agreement. We also discussed them in person together during this preop appointment. All of their questions were answered to their satisfaction.  Recommended calling if they have any further questions.  Risk consent form and Pain Medication Agreement to be scanned into patient's chart.  The risks that can be encountered with and after placement of a breast expander placement were discussed and include the following but not limited to these: bleeding, infection, delayed healing, anesthesia risks, skin sensation changes, injury to structures including nerves, blood vessels, and muscles which may be temporary or permanent, allergies to tape, suture materials and glues, blood products, topical preparations or injected agents, skin contour irregularities, skin discoloration and swelling, deep vein thrombosis, cardiac and pulmonary complications, pain, which may persist, fluid accumulation, wrinkling of the skin over the expander, changes in nipple or breast sensation, expander leakage or rupture, faulty position of the expander, persistent pain, formation of tight scar tissue around the expander (capsular contracture), possible need for revisional surgery or staged procedures.   Electronically signed by: Kermit Balo Farzana Koci, PA-C 04/10/2023 9:25 AM

## 2023-04-10 NOTE — H&P (View-Only) (Signed)
Patient ID: Eileen Hart, female    DOB: July 31, 1990, 32 y.o.   MRN: [id number]  Chief Complaint  Patient presents with   Pre-op Exam      ICD-10-CM   1. Malignant neoplasm of upper-outer quadrant of right breast in female, estrogen receptor negative (HCC)  C50.411    Z17.1     2. Invasive carcinoma of breast (HCC)  C50.919     3. Genetic testing  Z13.79     4. Mass of right breast, unspecified quadrant  N63.10       History of Present Illness: Eileen Hart is a 32 y.o.  female  with a history of right breast cancer.  She presents for preoperative evaluation for upcoming procedure, Bilateral immediate breast reconstruction with expanders and Flex HD placement , scheduled for 05/08/23 with Dr.  Ulice Bold  The patient has not had problems with anesthesia. No history of DVT/PE.  No family history of DVT/PE.  No family or personal history of bleeding or clotting disorders.  Patient is not currently taking any blood thinners.  No history of CVA/MI.   Summary of Previous Visit: Patient with invasive mammary carcinoma, grade 3.  She is receiving chemotherapy.  She has saline implants in place, 350 cc.  Placed in 2015. She has a Port-A-Cath in place.  Job: Does not require any forms  Past medical history: Bilateral breast augmentation approximately 10 years ago.  Chemotherapy/radiation: Completed chemotherapy 2 weeks ago.  No plans for radiation unless positive nodes per patient.  She has a history of bilateral breast augmentation, saline implants in place.  She reports the right breast implant is ruptured from the biopsy site.  She believes the implants are under her muscle.  Past Medical History: Allergies: No Known Allergies  Current Medications:  Current Outpatient Medications:    dexamethasone (DECADRON) 4 MG tablet, Take 2 tablets (8 mg total) by mouth daily for 3 days. Start the day after doxorubicin/cyclophosphamide chemotherapy. Take with food., Disp: 30 tablet,  Rfl: 1   gabapentin (NEURONTIN) 300 MG capsule, Take 1 capsule (300 mg total) by mouth 3 (three) times daily., Disp: 90 capsule, Rfl: 3   lidocaine-prilocaine (EMLA) cream, Apply to affected area once, Disp: 30 g, Rfl: 3   ondansetron (ZOFRAN) 8 MG tablet, Take 1 tab (8 mg) by mouth every 8 hrs as needed for nausea/vomiting. Start third day after doxorubicin/cyclophosphamide chemotherapy., Disp: 30 tablet, Rfl: 1   prochlorperazine (COMPAZINE) 10 MG tablet, Take 1 tablet (10 mg total) by mouth every 6 (six) hours as needed for nausea or vomiting., Disp: 30 tablet, Rfl: 1  Past Medical Problems: Past Medical History:  Diagnosis Date   Chlamydia 01/25/2010   Depression with anxiety    Gestational hypertension    Immunization, viral disease    gardasil series completed   Tobacco abuse 05/02/2016    Past Surgical History: Past Surgical History:  Procedure Laterality Date   AUGMENTATION MAMMAPLASTY Bilateral    saline implants 2015   BREAST BIOPSY Right 10/14/2022   US biopsy/ coil clip/ path pending   BREAST BIOPSY Right 10/14/2022   [place] RT BREAST BX W LOC DEV 1ST LESION IMG BX SPEC US GUIDE 10/14/2022 [facility]   BREAST BIOPSY Left 10/29/2022   [place] Core Bx Ribbon clip- path pending   BREAST BIOPSY Right 10/29/2022   [place] Core 1130 Venus Clip- path pending   BREAST BIOPSY Right 10/29/2022   [place] Core Bx Retroareolar heart clip-path pending  BREAST BIOPSY Right 10/29/2022   [place] Core Bx Ribbon Clip path pending   BREAST BIOPSY Right 10/29/2022   [place] RT BREAST BX W LOC DEV EA ADD LESION IMG BX SPEC US GUIDE 10/29/2022 [facility]   BREAST BIOPSY Right 10/29/2022   [place] RT BREAST BX W LOC DEV 1ST LESION IMG BX SPEC US GUIDE 10/29/2022 [facility]   BREAST BIOPSY Right 10/29/2022   [place] RT BREAST BX W LOC DEV EA ADD LESION IMG BX SPEC US GUIDE 10/29/2022 [facility]   BREAST BIOPSY Left 10/29/2022   [place] LT BREAST BX W LOC DEV 1ST LESION IMG BX SPEC US GUIDE 10/29/2022 [facility]    BREAST ENHANCEMENT SURGERY  2015   CESAREAN SECTION  2014   PORTACATH PLACEMENT N/A 10/24/2022   Procedure: INSERTION PORT-A-CATH;  Surgeon: Leafy Ro, MD;  Location: [facility];  Service: General;  Laterality: N/A;   TONSILLECTOMY      Social History: Social History   Socioeconomic History   Marital status: Single    Spouse name: Not on file   Number of children: 1   Years of education: 15   Highest education level: Not on file  Occupational History   Occupation: Groomer    Comment: [employer]  Tobacco Use   Smoking status: Former    Current packs/day: 0.00    Types: Cigarettes    Quit date: 07/25/2018    Years since quitting: 4.7   Smokeless tobacco: Never   Tobacco comments:    quit 07/2018  Vaping Use   Vaping status: Never Used  Substance and Sexual Activity   Alcohol use: No    Alcohol/week: 0.0 standard drinks of alcohol   Drug use: No   Sexual activity: Yes    Birth control/protection: Pill  Other Topics Concern   Not on file  Social History Narrative   Not on file   Social Determinants of Health   Financial Resource Strain: Not on file  Food Insecurity: No Food Insecurity (10/22/2022)   Hunger Vital Sign    Worried About Running Out of Food in the Last Year: Never true    Ran Out of Food in the Last Year: Never true  Transportation Needs: No Transportation Needs (10/22/2022)   PRAPARE - Administrator, Civil Service (Medical): No    Lack of Transportation (Non-Medical): No  Physical Activity: Not on file  Stress: Not on file  Social Connections: Not on file  Intimate Partner Violence: Not At Risk (10/22/2022)   Humiliation, Afraid, Rape, and Kick questionnaire    Fear of Current or Ex-Partner: No    Emotionally Abused: No    Physically Abused: No    Sexually Abused: No    Family History: Family History  Problem Relation Age of Onset   Breast cancer Other 37       mat 2nd cousin   Diabetes Neg Hx    Heart disease Neg Hx     Hypertension Neg Hx    Ovarian cancer Neg Hx    Colon cancer Neg Hx     Review of Systems: Review of Systems  Constitutional: Negative.   Respiratory: Negative.    Cardiovascular: Negative.   Gastrointestinal: Negative.   Genitourinary: Negative.   Neurological: Negative.     Physical Exam: Vital Signs BP 111/77 (BP Location: Left Arm, Patient Position: Sitting, Cuff Size: Small)   Pulse 96   SpO2 97%   Physical Exam Constitutional:      General: Not in  acute distress.    Appearance: Normal appearance. Not ill-appearing.  HENT:     Head: Normocephalic and atraumatic.  Eyes:     Pupils: Pupils are equal, round Neck:     Musculoskeletal: Normal range of motion.  Cardiovascular:     Rate and Rhythm: Normal rate    Pulses: Normal pulses.  Pulmonary:     Effort: Pulmonary effort is normal. No respiratory distress.  Musculoskeletal: Normal range of motion.  Skin:    General: Skin is warm and dry.     Findings: No erythema or rash.  Neurological:     General: No focal deficit present.     Mental Status: Alert and oriented to person, place, and time. Mental status is at baseline.     Motor: No weakness.  Psychiatric:        Mood and Affect: Mood normal.        Behavior: Behavior normal.    Assessment/Plan: The patient is scheduled for immediate bilateral breast reconstruction placement tissue expanders and Flex HD with Dr. Ulice Bold.  Risks, benefits, and alternatives of procedure discussed, questions answered and consent obtained.    Smoking Status: Non-smoker; Counseling Given?  N/A  Caprini Score: 6; Risk Factors include: Right breast cancer, Port-A-Cath in place, and length of planned surgery. Recommendation for mechanical prophylaxis. Encourage early ambulation.   Pictures obtained: @consult   Post-op Rx sent to pharmacy: Oxycodone, Keflex, Valium  Patient was provided with the breast reconstruction and General Surgical Risk consent document and Pain  Medication Agreement prior to their appointment.  They had adequate time to read through the risk consent documents and Pain Medication Agreement. We also discussed them in person together during this preop appointment. All of their questions were answered to their satisfaction.  Recommended calling if they have any further questions.  Risk consent form and Pain Medication Agreement to be scanned into patient's chart.  The risks that can be encountered with and after placement of a breast expander placement were discussed and include the following but not limited to these: bleeding, infection, delayed healing, anesthesia risks, skin sensation changes, injury to structures including nerves, blood vessels, and muscles which may be temporary or permanent, allergies to tape, suture materials and glues, blood products, topical preparations or injected agents, skin contour irregularities, skin discoloration and swelling, deep vein thrombosis, cardiac and pulmonary complications, pain, which may persist, fluid accumulation, wrinkling of the skin over the expander, changes in nipple or breast sensation, expander leakage or rupture, faulty position of the expander, persistent pain, formation of tight scar tissue around the expander (capsular contracture), possible need for revisional surgery or staged procedures.   Electronically signed by: Kermit Balo Farzana Koci, PA-C 04/10/2023 9:25 AM

## 2023-04-16 ENCOUNTER — Encounter: Payer: Self-pay | Admitting: Surgery

## 2023-04-16 ENCOUNTER — Ambulatory Visit (INDEPENDENT_AMBULATORY_CARE_PROVIDER_SITE_OTHER): Payer: Medicaid Other | Admitting: Surgery

## 2023-04-16 VITALS — BP 118/78 | HR 97 | Ht 64.0 in | Wt 119.0 lb

## 2023-04-16 DIAGNOSIS — Z171 Estrogen receptor negative status [ER-]: Secondary | ICD-10-CM | POA: Diagnosis not present

## 2023-04-16 DIAGNOSIS — C50411 Malignant neoplasm of upper-outer quadrant of right female breast: Secondary | ICD-10-CM | POA: Diagnosis not present

## 2023-04-16 NOTE — Patient Instructions (Addendum)
We have spoken today about breast surgery. Your Mastectomy will be scheduled at [facility] with Dr. Everlene Farrier.   You will spend at least 1 night in the hospital following surgery and go home with 2-4 drains for approximately 5-7 days following your surgery. Please keep an accurate record of your drain amount in ml's or cc's. If your drain suddenly stops draining or has drainage around the tube at the skin, call our office or Dr Dillingham's office and speak with a nurse immediately.  Please see the (blue) pre-care surgery sheet that you have been given today for more information regarding your surgery. Our surgery scheduler will call you to look at surgery dates and to go over information about your surgery.   If you have FMLA or Disability paperwork that needs to be filled out, please have your company fax your paperwork to [phone] or you may drop this by either office. This paperwork will be filled out within 3 days after your surgery has been completed.  Information regarding your surgery has been provided below. If you have any questions or concerns, please call our office and speak with a nurse.   Total or Modified Radical Mastectomy A total mastectomy and a modified radical mastectomy are types of surgery for breast cancer. If you are having a total mastectomy (simple mastectomy), your entire breast will be removed. If you are having a modified radical mastectomy, your breast and nipple will be removed along with the lymph nodes under your arm. You may also have some of the lining over the muscle tissues under your breast removed. LET [facility] CARE PROVIDER KNOW ABOUT: Any allergies you have. All medicines you are taking, including vitamins, herbs, eye drops, creams, and over-the-counter medicines. Previous problems you or members of your family have had with the use of anesthetics. Any blood disorders you have. Previous surgeries you have had. Medical conditions you have. RISKS AND  COMPLICATIONS Generally, this is a safe procedure. However, problems may occur, including: Pain. Infection. Bleeding. Scar tissue. Chest numbness on the side of the surgery. Fluid buildup under the skin flaps where your breast was removed (seroma). Sensation of throbbing or tingling. Stress or sadness from losing your breast. If you have the lymph nodes under your arm removed, you may have arm swelling, weakness, or numbness on the same side of your body as your surgery. BEFORE THE PROCEDURE Ask your health care provider about: Changing or stopping your regular medicines. This is especially important if you are taking diabetes medicines or blood thinners. Taking medicines such as aspirin and ibuprofen. These medicines can thin your blood. Do not take these medicines before your procedure if your health care provider instructs you not to. Follow your health care provider's instructions about eating or drinking restrictions. Plan to have someone take you home after the procedure. PROCEDURE An IV tube will be inserted into one of your veins. You will be given a medicine that makes you fall asleep (general anesthetic). Your breast will be cleaned with a germ-killing solution (antiseptic). A wide incision will be made around your nipple. The skin and nipple inside the incision will be removed along with all breast tissue. If you are having a modified radical mastectomy: The lining over your chest muscles will be removed. The incision may be extended to reach the lymph nodes under your arm, or a second incision may be made. The lymph nodes will be removed. You may have a drainage tube inserted into your incision to collect  fluid that builds up after surgery. This tube is connected to a suction bulb. Your incision or incisions will be closed with stitches (sutures). A bandage (dressing) will be placed over your breast and under your arm. The procedure may vary among health care providers and  hospitals. AFTER THE PROCEDURE You will be moved to a recovery area. Your blood pressure, heart rate, breathing rate, and blood oxygen level will be monitored often until the medicines you were given have worn off. You will be given pain medicine as needed. After a while, you will be taken to a hospital room. You will be encouraged to get up and walk as soon as you can. Your IV tube can be removed when you are able to eat and drink. Your drain may be removed before you go home from the hospital, or you may be sent home with your drain and suction bulb.    Breast Reconstruction With Implant or Tissue Expander Insertion  Breast reconstruction is surgery to rebuild a breast after it was removed as part of cancer treatment. Many different procedures can be used in breast reconstruction. One method involves creating a new breast with an implant or a tissue expander: An implant that is filled with silicone or salt water (saline) may be placed under the breast flap during your first surgery. A tissue expander is an empty implant that is gradually filled with saline over a period of weeks as your skin and muscles expand. This option requires several follow-up visits. When your breast reaches the right size, the expander will be removed surgically and replaced with a regular implant. Breast reconstruction may be done at the same time as the breast removal (mastectomy), or it may be done at a later date. Breast reconstruction often requires multiple operations, even if you choose reconstruction at the time of your mastectomy. If you are receiving radiation therapy after breast surgery, your health care provider may recommend waiting until you're finished with radiation therapy. Tell a health care provider about: Any allergies you have. All medicines you are taking, including vitamins, herbs, eye drops, creams, and over-the-counter medicines. Any problems you or family members have had with anesthetic  medicines. Any bleeding problems you have. Any surgeries you have had. Any medical conditions you have. Whether you are pregnant or may be pregnant. Any history of radiation therapy to your chest. What are the risks? Generally, this is a safe procedure. However, problems may occur, including: Infection. Bleeding. Allergic reactions to medicines. Scars and bruising. Loss of feeling (sensation) in your reconstructed breast or nipple. Loss of some or all of the breast tissue or breast tissue flap. Other problems that may occur specific to the implant include: A leak, burst, or puncture to the implant. Additional surgery, either to remove or replace the implants. With certain breast implants there is a low but increased risk of developing a type of cancer known as breast implant-associated anaplastic large cell lymphoma (BIA-ALCL). This type of cancer occurs in the breast tissue, but it is not breast cancer. Breast implants can make mammograms difficult to do. The implants may affect the result of the mammogram or the evaluation of the results.

## 2023-04-16 NOTE — Progress Notes (Signed)
Outpatient Surgical Follow Up  04/16/2023  Eileen Hart is an 32 y.o. female.   Chief Complaint  Patient presents with   Follow-up    HPI:  Eileen Hart is a 32 y.o. female seen in f/u w a diagnosis of triple negative right breast CA T1c N0 M0. Mass on Mammogram  1.6 x 1 x 1.5 cm solid mass in the right breast 9:30 position 5 cm from the nipple.  She did have neoadjuvant with good response and completed rx less than 2 weeks ago and continues to improve. She did HAve bilateral MRI w additional biopsies. She had 3 additional breast biopsies in the right side and 1 left breast biopsy and all of them were negative for malignancy. PET CT scan showed focal hypermetabolic activity in the right breast adjacent to the biopsy clip but no other additional areas of concern.  She did have a history of breast augmentation with submuscular saline implants.   She Is able to perform more than 4 METS of activity without any shortness of breath or chest pain.  She quit smoking more than 3 years ago but she still uses nicotine products.  Her mother had a history of first-degree cousin with breast cancer that developed around age 21.  No other family members with cancer. Menarche at age 49.  Gravida 2 para 1M1.  She did have breast augmentation surgery right after breast-feeding. She Did have a C-section 9 years ago. SHe is accompanied by her mother She is about to complete her chemo in 2 weeks, she has decided to proceed with bilateral mastectomies w reconstruction She is also interested in port removal  Past Medical History:  Diagnosis Date   Chlamydia 01/25/2010   Depression with anxiety    Gestational hypertension    Immunization, viral disease    gardasil series completed   Tobacco abuse 05/02/2016    Past Surgical History:  Procedure Laterality Date   AUGMENTATION MAMMAPLASTY Bilateral    saline implants 2015   BREAST BIOPSY Right 10/14/2022   US biopsy/ coil clip/ path pending   BREAST  BIOPSY Right 10/14/2022   [place] RT BREAST BX W LOC DEV 1ST LESION IMG BX SPEC US GUIDE 10/14/2022 [facility]   BREAST BIOPSY Left 10/29/2022   [place] Core Bx Ribbon clip- path pending   BREAST BIOPSY Right 10/29/2022   [place] Core 1130 Venus Clip- path pending   BREAST BIOPSY Right 10/29/2022   [place] Core Bx Retroareolar heart clip-path pending   BREAST BIOPSY Right 10/29/2022   [place] Core Bx Ribbon Clip path pending   BREAST BIOPSY Right 10/29/2022   [place] RT BREAST BX W LOC DEV EA ADD LESION IMG BX SPEC US GUIDE 10/29/2022 [facility]   BREAST BIOPSY Right 10/29/2022   [place] RT BREAST BX W LOC DEV 1ST LESION IMG BX SPEC US GUIDE 10/29/2022 [facility]   BREAST BIOPSY Right 10/29/2022   [place] RT BREAST BX W LOC DEV EA ADD LESION IMG BX SPEC US GUIDE 10/29/2022 [facility]   BREAST BIOPSY Left 10/29/2022   [place] LT BREAST BX W LOC DEV 1ST LESION IMG BX SPEC US GUIDE 10/29/2022 [facility]   BREAST ENHANCEMENT SURGERY  2015   CESAREAN SECTION  2014   PORTACATH PLACEMENT N/A 10/24/2022   Procedure: INSERTION PORT-A-CATH;  Surgeon: Leafy Ro, MD;  Location: [facility];  Service: General;  Laterality: N/A;   TONSILLECTOMY      Family History  Problem Relation Age of Onset   Breast cancer  Other 35       mat 2nd cousin   Diabetes Neg Hx    Heart disease Neg Hx    Hypertension Neg Hx    Ovarian cancer Neg Hx    Colon cancer Neg Hx     Social History:  reports that she quit smoking about 4 years ago. Her smoking use included cigarettes. She has been exposed to tobacco smoke. She has never used smokeless tobacco. She reports that she does not drink alcohol and does not use drugs.  Allergies: No Known Allergies  Medications reviewed.    ROS Full ROS performed and is otherwise negative other than what is stated in HPI   BP 118/78   Pulse 97   Ht 5\' 4"  (1.626 m)   Wt 119 lb (54 kg)   SpO2 98%   BMI 20.43 kg/m   Physical Exam Physical Exam CONSTITUTIONAL: NAD. EYES: Pupils are equal,  round, Sclera are non-icteric. EARS, NOSE, MOUTH AND THROAT: The oral mucosa is pink and moist. Hearing is intact to voice. LYMPH NODES:  Lymph nodes in the neck are normal. RESPIRATORY:  Lungs are clear. There is normal respiratory effort, with equal breath sounds bilaterally, and without pathologic use of accessory muscles. Chest wall w port on the Left side w/o infection CARDIOVASCULAR: Heart is regular without murmurs, gallops, or rubs. Left port in place w/o infection BREAST: I can not longer palpate prior nodule on the Right breast.  The left breast tissue seems to be dense There is no evidence of axillary lymphadenopathy Evidence of prior breast augmentation surgery bilaterally. Right breast is ptotic and is asymmetric compared to the Left side ( right is smaller) GI: The abdomen is  soft, nontender, and nondistended. There are no palpable masses. There is no hepatosplenomegaly. There are normal bowel sounds in all quadrants. GU: Rectal deferred.   MUSCULOSKELETAL: Normal muscle strength and tone. No cyanosis or edema.   SKIN: Turgor is good and there are no pathologic skin lesions or ulcers. NEUROLOGIC: Motor and sensation is grossly normal. Cranial nerves are grossly intact. PSYCH:  Oriented to person, place and time. Affect is normal.    Assessment/Plan: 32 yo female with stage Ib invasive mammary carcinoma of the right breast , triple Negative breast cancer status post neoadjuvant therapy which she recently completed. She has been recovering well from chemo.  we will schedule her in about 3 weeks.  We Will plan for bilateral mastectomy likely nipple sparing with reconstruction and SLNB on the Right w port removal and removal of breast implants.  This will be a combined case with plastic surgery.  Procedure discussed with her and her mother in detail.  Risk, benefits and possible implications including but not limited to: Seroma, breast deformity, nipple necrosis, infection, bleeding,  pain.  They understand and wish to proceed. Please note I spent 30 minutes in this encounter including personally reviewing medical records, images studies, coordinating her care, placing orders, counseling the patient and performing documentation.   Sterling Big, MD [facility] Surgeon

## 2023-04-16 NOTE — H&P (View-Only) (Signed)
Outpatient Surgical Follow Up  04/16/2023  Eileen Hart is an 32 y.o. female.   Chief Complaint  Patient presents with   Follow-up    HPI:  Eileen Hart is a 32 y.o. female seen in f/u w a diagnosis of triple negative right breast CA T1c N0 M0. Mass on Mammogram  1.6 x 1 x 1.5 cm solid mass in the right breast 9:30 position 5 cm from the nipple.  She did have neoadjuvant with good response and completed rx less than 2 weeks ago and continues to improve. She did HAve bilateral MRI w additional biopsies. She had 3 additional breast biopsies in the right side and 1 left breast biopsy and all of them were negative for malignancy. PET CT scan showed focal hypermetabolic activity in the right breast adjacent to the biopsy clip but no other additional areas of concern.  She did have a history of breast augmentation with submuscular saline implants.   She Is able to perform more than 4 METS of activity without any shortness of breath or chest pain.  She quit smoking more than 3 years ago but she still uses nicotine products.  Her mother had a history of first-degree cousin with breast cancer that developed around age 21.  No other family members with cancer. Menarche at age 49.  Gravida 2 para 1M1.  She did have breast augmentation surgery right after breast-feeding. She Did have a C-section 9 years ago. SHe is accompanied by her mother She is about to complete her chemo in 2 weeks, she has decided to proceed with bilateral mastectomies w reconstruction She is also interested in port removal  Past Medical History:  Diagnosis Date   Chlamydia 01/25/2010   Depression with anxiety    Gestational hypertension    Immunization, viral disease    gardasil series completed   Tobacco abuse 05/02/2016    Past Surgical History:  Procedure Laterality Date   AUGMENTATION MAMMAPLASTY Bilateral    saline implants 2015   BREAST BIOPSY Right 10/14/2022   US biopsy/ coil clip/ path pending   BREAST  BIOPSY Right 10/14/2022   [place] RT BREAST BX W LOC DEV 1ST LESION IMG BX SPEC US GUIDE 10/14/2022 [facility]   BREAST BIOPSY Left 10/29/2022   [place] Core Bx Ribbon clip- path pending   BREAST BIOPSY Right 10/29/2022   [place] Core 1130 Venus Clip- path pending   BREAST BIOPSY Right 10/29/2022   [place] Core Bx Retroareolar heart clip-path pending   BREAST BIOPSY Right 10/29/2022   [place] Core Bx Ribbon Clip path pending   BREAST BIOPSY Right 10/29/2022   [place] RT BREAST BX W LOC DEV EA ADD LESION IMG BX SPEC US GUIDE 10/29/2022 [facility]   BREAST BIOPSY Right 10/29/2022   [place] RT BREAST BX W LOC DEV 1ST LESION IMG BX SPEC US GUIDE 10/29/2022 [facility]   BREAST BIOPSY Right 10/29/2022   [place] RT BREAST BX W LOC DEV EA ADD LESION IMG BX SPEC US GUIDE 10/29/2022 [facility]   BREAST BIOPSY Left 10/29/2022   [place] LT BREAST BX W LOC DEV 1ST LESION IMG BX SPEC US GUIDE 10/29/2022 [facility]   BREAST ENHANCEMENT SURGERY  2015   CESAREAN SECTION  2014   PORTACATH PLACEMENT N/A 10/24/2022   Procedure: INSERTION PORT-A-CATH;  Surgeon: Leafy Ro, MD;  Location: [facility];  Service: General;  Laterality: N/A;   TONSILLECTOMY      Family History  Problem Relation Age of Onset   Breast cancer  Other 35       mat 2nd cousin   Diabetes Neg Hx    Heart disease Neg Hx    Hypertension Neg Hx    Ovarian cancer Neg Hx    Colon cancer Neg Hx     Social History:  reports that she quit smoking about 4 years ago. Her smoking use included cigarettes. She has been exposed to tobacco smoke. She has never used smokeless tobacco. She reports that she does not drink alcohol and does not use drugs.  Allergies: No Known Allergies  Medications reviewed.    ROS Full ROS performed and is otherwise negative other than what is stated in HPI   BP 118/78   Pulse 97   Ht 5\' 4"  (1.626 m)   Wt 119 lb (54 kg)   SpO2 98%   BMI 20.43 kg/m   Physical Exam Physical Exam CONSTITUTIONAL: NAD. EYES: Pupils are equal,  round, Sclera are non-icteric. EARS, NOSE, MOUTH AND THROAT: The oral mucosa is pink and moist. Hearing is intact to voice. LYMPH NODES:  Lymph nodes in the neck are normal. RESPIRATORY:  Lungs are clear. There is normal respiratory effort, with equal breath sounds bilaterally, and without pathologic use of accessory muscles. Chest wall w port on the Left side w/o infection CARDIOVASCULAR: Heart is regular without murmurs, gallops, or rubs. Left port in place w/o infection BREAST: I can not longer palpate prior nodule on the Right breast.  The left breast tissue seems to be dense There is no evidence of axillary lymphadenopathy Evidence of prior breast augmentation surgery bilaterally. Right breast is ptotic and is asymmetric compared to the Left side ( right is smaller) GI: The abdomen is  soft, nontender, and nondistended. There are no palpable masses. There is no hepatosplenomegaly. There are normal bowel sounds in all quadrants. GU: Rectal deferred.   MUSCULOSKELETAL: Normal muscle strength and tone. No cyanosis or edema.   SKIN: Turgor is good and there are no pathologic skin lesions or ulcers. NEUROLOGIC: Motor and sensation is grossly normal. Cranial nerves are grossly intact. PSYCH:  Oriented to person, place and time. Affect is normal.    Assessment/Plan: 32 yo female with stage Ib invasive mammary carcinoma of the right breast , triple Negative breast cancer status post neoadjuvant therapy which she recently completed. She has been recovering well from chemo.  we will schedule her in about 3 weeks.  We Will plan for bilateral mastectomy likely nipple sparing with reconstruction and SLNB on the Right w port removal and removal of breast implants.  This will be a combined case with plastic surgery.  Procedure discussed with her and her mother in detail.  Risk, benefits and possible implications including but not limited to: Seroma, breast deformity, nipple necrosis, infection, bleeding,  pain.  They understand and wish to proceed. Please note I spent 30 minutes in this encounter including personally reviewing medical records, images studies, coordinating her care, placing orders, counseling the patient and performing documentation.   Sterling Big, MD [facility] Surgeon

## 2023-04-17 ENCOUNTER — Encounter: Payer: Self-pay | Admitting: Oncology

## 2023-04-18 ENCOUNTER — Other Ambulatory Visit: Payer: Self-pay | Admitting: *Deleted

## 2023-04-18 DIAGNOSIS — Z171 Estrogen receptor negative status [ER-]: Secondary | ICD-10-CM

## 2023-04-21 ENCOUNTER — Other Ambulatory Visit: Payer: Medicaid Other

## 2023-04-21 DIAGNOSIS — Z171 Estrogen receptor negative status [ER-]: Secondary | ICD-10-CM

## 2023-04-21 DIAGNOSIS — Z5111 Encounter for antineoplastic chemotherapy: Secondary | ICD-10-CM | POA: Diagnosis not present

## 2023-04-21 LAB — CBC WITH DIFFERENTIAL/PLATELET
Abs Immature Granulocytes: 0 10*3/uL (ref 0.00–0.07)
Basophils Absolute: 0 10*3/uL (ref 0.0–0.1)
Basophils Relative: 1 %
Eosinophils Absolute: 0.1 10*3/uL (ref 0.0–0.5)
Eosinophils Relative: 4 %
HCT: 33.7 % — ABNORMAL LOW (ref 36.0–46.0)
Hemoglobin: 11.2 g/dL — ABNORMAL LOW (ref 12.0–15.0)
Immature Granulocytes: 0 %
Lymphocytes Relative: 34 %
Lymphs Abs: 1.1 10*3/uL (ref 0.7–4.0)
MCH: 31.6 pg (ref 26.0–34.0)
MCHC: 33.2 g/dL (ref 30.0–36.0)
MCV: 95.2 fL (ref 80.0–100.0)
Monocytes Absolute: 0.4 10*3/uL (ref 0.1–1.0)
Monocytes Relative: 13 %
Neutro Abs: 1.6 10*3/uL — ABNORMAL LOW (ref 1.7–7.7)
Neutrophils Relative %: 48 %
Platelets: 159 10*3/uL (ref 150–400)
RBC: 3.54 MIL/uL — ABNORMAL LOW (ref 3.87–5.11)
RDW: 13.2 % (ref 11.5–15.5)
WBC: 3.3 10*3/uL — ABNORMAL LOW (ref 4.0–10.5)
nRBC: 0 % (ref 0.0–0.2)

## 2023-04-25 HISTORY — PX: PORT-A-CATH REMOVAL: SHX5289

## 2023-04-30 ENCOUNTER — Encounter
Admission: RE | Admit: 2023-04-30 | Discharge: 2023-04-30 | Disposition: A | Discharge status: Home / Self Care | Payer: Medicaid Other | Source: Ambulatory Visit | Attending: Surgery | Admitting: Surgery

## 2023-04-30 ENCOUNTER — Other Ambulatory Visit: Payer: Self-pay

## 2023-04-30 VITALS — Ht 64.0 in | Wt 119.0 lb

## 2023-04-30 DIAGNOSIS — Z01812 Encounter for preprocedural laboratory examination: Secondary | ICD-10-CM

## 2023-04-30 HISTORY — DX: Malignant neoplasm of unspecified site of unspecified female breast: C50.919

## 2023-04-30 NOTE — Patient Instructions (Addendum)
Your procedure is scheduled on:   Thursday November 14  Report to the Registration Desk on the 1st floor of the [facility] To find out your arrival time, please call [phone] between 1PM - 3PM on:  Wednesday November 13 If your arrival time is 6:00 am, do not arrive before that time as the [facility] entrance doors do not open until 6:00 am.  REMEMBER: Instructions that are not followed completely may result in serious medical risk, up to and including death; or upon the discretion of your surgeon and anesthesiologist your surgery may need to be rescheduled.  Do not eat food after midnight the night before surgery.  No gum chewing or hard candies.  You may however, drink CLEAR liquids up to 2 hours before you are scheduled to arrive for your surgery. Do not drink anything within 2 hours of your scheduled arrival time.  Clear liquids include: - water  - apple juice without pulp - gatorade (not RED colors) - black coffee or tea (Do NOT add milk or creamers to the coffee or tea) Do NOT drink anything that is not on this list.   One week prior to surgery: Thursday November 7  Stop Anti-inflammatories (NSAIDS) such as Advil, Aleve, Ibuprofen, Motrin, Naproxen, Naprosyn and Aspirin based products such as Excedrin, Goody's Powder, BC Powder. Stop ANY OVER THE COUNTER supplements until after surgery.  You may however, continue to take Tylenol if needed for pain up until the day of surgery.  Continue taking all of your other prescription medications up until the day of surgery.  ON THE DAY OF SURGERY ONLY TAKE THESE MEDICATIONS WITH SIPS OF WATER:  gabapentin (NEURONTIN)   No Alcohol for 24 hours before or after surgery.  No Smoking including e-cigarettes for 24 hours before surgery.  No chewable tobacco products for at least 6 hours before surgery.  No nicotine patches on the day of surgery.  Do not use any "recreational" drugs for at least a week (preferably 2 weeks) before  your surgery.  Please be advised that the combination of cocaine and anesthesia may have negative outcomes, up to and including death. If you test positive for cocaine, your surgery will be cancelled.  On the morning of surgery brush your teeth with toothpaste and water, you may rinse your mouth with mouthwash if you wish. Do not swallow any toothpaste or mouthwash.  Use CHG Soap as directed on instruction sheet.  Do not wear jewelry, make-up, hairpins, clips or nail polish.  For welded (permanent) jewelry: bracelets, anklets, waist bands, etc.  Please have this removed prior to surgery.  If it is not removed, there is a chance that hospital personnel will need to cut it off on the day of surgery.  Do not wear lotions, powders, or perfumes.   Do not shave body hair from the neck down 48 hours before surgery.  Do not bring valuables to the hospital. [facility] is not responsible for any missing/lost belongings or valuables.   Notify your doctor if there is any change in your medical condition (cold, fever, infection).  Wear comfortable clothing (specific to your surgery type) to the hospital.  After surgery, you can help prevent lung complications by doing breathing exercises.   If you are being admitted to the hospital overnight, leave your suitcase in the car. After surgery it may be brought to your room.  In case of increased patient census, it may be necessary for you, the patient, to continue your  postoperative care in the Same Day Surgery department.  If you are being discharged the day of surgery, you will not be allowed to drive home. You will need a responsible individual to drive you home and stay with you for 24 hours after surgery.   If you are taking public transportation, you will need to have a responsible individual with you.  Please call the Pre-admissions Testing Dept. at [phone] if you have any questions about these instructions.  Surgery Visitation  Policy:  Patients having surgery or a procedure may have two visitors.  Children under the age of 37 must have an adult with them who is not the patient.  Inpatient Visitation:    Visiting hours are 7 a.m. to 8 p.m. Up to four visitors are allowed at one time in a patient room. The visitors may rotate out with other people during the day.  One visitor age 26 or older may stay with the patient overnight and must be in the room by 8 p.m.             Preparing for Surgery with CHLORHEXIDINE GLUCONATE (CHG) Soap  Chlorhexidine Gluconate (CHG) Soap  o An antiseptic cleaner that kills germs and bonds with the skin to continue killing germs even after washing  o Used for showering the night before surgery and morning of surgery  Before surgery, you can play an important role by reducing the number of germs on your skin.  CHG (Chlorhexidine gluconate) soap is an antiseptic cleanser which kills germs and bonds with the skin to continue killing germs even after washing.  Please do not use if you have an allergy to CHG or antibacterial soaps. If your skin becomes reddened/irritated stop using the CHG.  1. Shower the NIGHT BEFORE SURGERY and the MORNING OF SURGERY with CHG soap.  2. If you choose to wash your hair, wash your hair first as usual with your normal shampoo.  3. After shampooing, rinse your hair and body thoroughly to remove the shampoo.  4. Use CHG as you would any other liquid soap. You can apply CHG directly to the skin and wash gently with a scrungie or a clean washcloth.  5. Apply the CHG soap to your body only from the neck down. Do not use on open wounds or open sores. Avoid contact with your eyes, ears, mouth, and genitals (private parts). Wash face and genitals (private parts) with your normal soap.  6. Wash thoroughly, paying special attention to the area where your surgery will be performed.  7. Thoroughly rinse your body with warm water.  8. Do not  shower/wash with your normal soap after using and rinsing off the CHG soap.  9. Pat yourself dry with a clean towel.  10. Wear clean pajamas to bed the night before surgery.  12. Place clean sheets on your bed the night of your first shower and do not sleep with pets.  13. Shower again with the CHG soap on the day of surgery prior to arriving at the hospital.  14. Do not apply any deodorants/lotions/powders.  15. Please wear clean clothes to the hospital.

## 2023-05-02 ENCOUNTER — Inpatient Hospital Stay: Payer: Medicaid Other | Attending: Oncology

## 2023-05-02 DIAGNOSIS — Z9013 Acquired absence of bilateral breasts and nipples: Secondary | ICD-10-CM | POA: Insufficient documentation

## 2023-05-02 DIAGNOSIS — Z171 Estrogen receptor negative status [ER-]: Secondary | ICD-10-CM | POA: Insufficient documentation

## 2023-05-02 DIAGNOSIS — Z9012 Acquired absence of left breast and nipple: Secondary | ICD-10-CM | POA: Insufficient documentation

## 2023-05-02 DIAGNOSIS — Z87891 Personal history of nicotine dependence: Secondary | ICD-10-CM | POA: Insufficient documentation

## 2023-05-02 DIAGNOSIS — C50811 Malignant neoplasm of overlapping sites of right female breast: Secondary | ICD-10-CM | POA: Insufficient documentation

## 2023-05-02 NOTE — Progress Notes (Signed)
[facility] CSW Progress Note  Clinical Social Worker contacted patient by phone to invite her and her family to [place] event on December 3rd.  Mailed her the event flyer and she stated she was interested.  Also inquired about any other needs or issues she might have and she denied at this time.Dorothey Baseman, LCSW Clinical Social Worker [facility]

## 2023-05-08 ENCOUNTER — Ambulatory Visit
Admission: RE | Admit: 2023-05-08 | Discharge: 2023-05-08 | Disposition: A | Discharge status: Home / Self Care | Payer: Medicaid Other | Source: Ambulatory Visit | Attending: Surgery | Admitting: Surgery

## 2023-05-08 ENCOUNTER — Ambulatory Visit: Payer: Medicaid Other | Admitting: Anesthesiology

## 2023-05-08 ENCOUNTER — Observation Stay
Admission: RE | Admit: 2023-05-08 | Discharge: 2023-05-09 | Disposition: A | Discharge status: Home / Self Care | Payer: Medicaid Other | Attending: Surgery | Admitting: Surgery

## 2023-05-08 ENCOUNTER — Encounter: Payer: Self-pay | Admitting: Surgery

## 2023-05-08 ENCOUNTER — Other Ambulatory Visit: Payer: Self-pay

## 2023-05-08 ENCOUNTER — Encounter
Admission: RE | Disposition: A | Discharge status: Home / Self Care | Payer: Self-pay | Source: Home / Self Care | Attending: Surgery

## 2023-05-08 DIAGNOSIS — Z9013 Acquired absence of bilateral breasts and nipples: Principal | ICD-10-CM

## 2023-05-08 DIAGNOSIS — C50411 Malignant neoplasm of upper-outer quadrant of right female breast: Secondary | ICD-10-CM | POA: Diagnosis present

## 2023-05-08 DIAGNOSIS — Z4001 Encounter for prophylactic removal of breast: Secondary | ICD-10-CM

## 2023-05-08 DIAGNOSIS — Z171 Estrogen receptor negative status [ER-]: Secondary | ICD-10-CM | POA: Diagnosis not present

## 2023-05-08 DIAGNOSIS — Z452 Encounter for adjustment and management of vascular access device: Secondary | ICD-10-CM

## 2023-05-08 DIAGNOSIS — Z01812 Encounter for preprocedural laboratory examination: Secondary | ICD-10-CM

## 2023-05-08 DIAGNOSIS — Z421 Encounter for breast reconstruction following mastectomy: Secondary | ICD-10-CM | POA: Diagnosis not present

## 2023-05-08 DIAGNOSIS — Z87891 Personal history of nicotine dependence: Secondary | ICD-10-CM | POA: Insufficient documentation

## 2023-05-08 DIAGNOSIS — C50911 Malignant neoplasm of unspecified site of right female breast: Secondary | ICD-10-CM | POA: Diagnosis not present

## 2023-05-08 HISTORY — PX: MASTECTOMY W/ SENTINEL NODE BIOPSY: SHX2001

## 2023-05-08 HISTORY — PX: BREAST RECONSTRUCTION WITH PLACEMENT OF TISSUE EXPANDER AND FLEX HD (ACELLULAR HYDRATED DERMIS): SHX6295

## 2023-05-08 LAB — POCT PREGNANCY, URINE: Preg Test, Ur: NEGATIVE

## 2023-05-08 SURGERY — MASTECTOMY WITH SENTINEL LYMPH NODE BIOPSY
Anesthesia: General | Site: Breast | Laterality: Bilateral

## 2023-05-08 MED ORDER — SODIUM CHLORIDE 0.9 % IV SOLN: INTRAVENOUS | Status: DC

## 2023-05-08 MED ORDER — ROCURONIUM BROMIDE 100 MG/10ML IV SOLN
INTRAVENOUS | Status: DC | PRN
  Administered 2023-05-08: 10 mg via INTRAVENOUS
  Administered 2023-05-08: 50 mg via INTRAVENOUS
  Administered 2023-05-08: 20 mg via INTRAVENOUS
  Administered 2023-05-08: 30 mg via INTRAVENOUS

## 2023-05-08 MED ORDER — PROPOFOL 1000 MG/100ML IV EMUL
INTRAVENOUS | Status: AC
Start: 2023-05-08 — End: ?
  Filled 2023-05-08: qty 100

## 2023-05-08 MED ORDER — GABAPENTIN 300 MG PO CAPS
300.0000 mg | ORAL_CAPSULE | Freq: Three times a day (TID) | ORAL | Status: DC
  Administered 2023-05-08 – 2023-05-09 (×2): 300 mg via ORAL
  Filled 2023-05-08 (×2): qty 1

## 2023-05-08 MED ORDER — SODIUM CHLORIDE FLUSH 0.9 % IV SOLN
INTRAVENOUS | Status: AC
  Filled 2023-05-08: qty 20

## 2023-05-08 MED ORDER — CHLORHEXIDINE GLUCONATE CLOTH 2 % EX PADS
6.0000 | MEDICATED_PAD | Freq: Once | CUTANEOUS | Status: AC
  Administered 2023-05-08: 6 via TOPICAL

## 2023-05-08 MED ORDER — PHENYLEPHRINE 80 MCG/ML (10ML) SYRINGE FOR IV PUSH (FOR BLOOD PRESSURE SUPPORT)
PREFILLED_SYRINGE | INTRAVENOUS | Status: AC
  Filled 2023-05-08: qty 10

## 2023-05-08 MED ORDER — FENTANYL CITRATE (PF) 100 MCG/2ML IJ SOLN
INTRAMUSCULAR | Status: AC
  Filled 2023-05-08: qty 2

## 2023-05-08 MED ORDER — ROCURONIUM BROMIDE 10 MG/ML (PF) SYRINGE
PREFILLED_SYRINGE | INTRAVENOUS | Status: AC
  Filled 2023-05-08: qty 10

## 2023-05-08 MED ORDER — PROPOFOL 1000 MG/100ML IV EMUL
INTRAVENOUS | Status: AC
  Filled 2023-05-08: qty 100

## 2023-05-08 MED ORDER — OXYCODONE HCL 5 MG PO TABS
5.0000 mg | ORAL_TABLET | ORAL | Status: DC | PRN
  Administered 2023-05-08: 5 mg via ORAL
  Administered 2023-05-09: 10 mg via ORAL
  Administered 2023-05-09: 5 mg via ORAL
  Filled 2023-05-08 (×2): qty 1
  Filled 2023-05-08: qty 2

## 2023-05-08 MED ORDER — PHENYLEPHRINE 80 MCG/ML (10ML) SYRINGE FOR IV PUSH (FOR BLOOD PRESSURE SUPPORT)
PREFILLED_SYRINGE | INTRAVENOUS | Status: DC | PRN
  Administered 2023-05-08: 80 ug via INTRAVENOUS

## 2023-05-08 MED ORDER — LIDOCAINE HCL (CARDIAC) PF 100 MG/5ML IV SOSY
PREFILLED_SYRINGE | INTRAVENOUS | Status: DC | PRN
  Administered 2023-05-08: 50 mg via INTRAVENOUS

## 2023-05-08 MED ORDER — ONDANSETRON HCL 4 MG/2ML IJ SOLN: 4.0000 mg | Freq: Four times a day (QID) | INTRAMUSCULAR | Status: DC | PRN

## 2023-05-08 MED ORDER — HEMOSTATIC AGENTS (NO CHARGE) OPTIME
TOPICAL | Status: DC | PRN
  Administered 2023-05-08: 1 via TOPICAL

## 2023-05-08 MED ORDER — GABAPENTIN 300 MG PO CAPS
ORAL_CAPSULE | ORAL | Status: AC
  Filled 2023-05-08: qty 1

## 2023-05-08 MED ORDER — ACETAMINOPHEN 500 MG PO TABS
1000.0000 mg | ORAL_TABLET | Freq: Four times a day (QID) | ORAL | Status: DC | PRN
  Administered 2023-05-09: 1000 mg via ORAL
  Filled 2023-05-08: qty 2

## 2023-05-08 MED ORDER — ACETAMINOPHEN 500 MG PO TABS
1000.0000 mg | ORAL_TABLET | ORAL | Status: AC
  Administered 2023-05-08: 1000 mg via ORAL

## 2023-05-08 MED ORDER — CEFAZOLIN SODIUM-DEXTROSE 2-4 GM/100ML-% IV SOLN
2.0000 g | INTRAVENOUS | Status: AC
  Administered 2023-05-08 (×2): 2 g via INTRAVENOUS

## 2023-05-08 MED ORDER — ONDANSETRON HCL 4 MG/2ML IJ SOLN
INTRAMUSCULAR | Status: AC
  Filled 2023-05-08: qty 2

## 2023-05-08 MED ORDER — ISOSULFAN BLUE 1 % ~~LOC~~ SOLN
SUBCUTANEOUS | Status: AC
  Filled 2023-05-08: qty 5

## 2023-05-08 MED ORDER — CELECOXIB 200 MG PO CAPS
200.0000 mg | ORAL_CAPSULE | ORAL | Status: AC
  Administered 2023-05-08: 200 mg via ORAL

## 2023-05-08 MED ORDER — GABAPENTIN 300 MG PO CAPS: 300.0000 mg | ORAL_CAPSULE | ORAL | Status: DC

## 2023-05-08 MED ORDER — LACTATED RINGERS IV SOLN: INTRAVENOUS | Status: DC

## 2023-05-08 MED ORDER — OXYCODONE HCL 5 MG PO TABS
5.0000 mg | ORAL_TABLET | Freq: Once | ORAL | Status: AC | PRN
  Administered 2023-05-08: 5 mg via ORAL

## 2023-05-08 MED ORDER — CHLORHEXIDINE GLUCONATE 0.12 % MT SOLN
OROMUCOSAL | Status: AC
Start: 2023-05-08 — End: ?
  Filled 2023-05-08: qty 15

## 2023-05-08 MED ORDER — CEFAZOLIN SODIUM-DEXTROSE 2-4 GM/100ML-% IV SOLN: 2.0000 g | INTRAVENOUS | Status: DC

## 2023-05-08 MED ORDER — ONDANSETRON HCL 4 MG/2ML IJ SOLN
INTRAMUSCULAR | Status: DC | PRN
  Administered 2023-05-08: 4 mg via INTRAVENOUS

## 2023-05-08 MED ORDER — OXYCODONE HCL 5 MG/5ML PO SOLN: 5.0000 mg | Freq: Once | ORAL | Status: AC | PRN

## 2023-05-08 MED ORDER — LIDOCAINE HCL (PF) 2 % IJ SOLN
INTRAMUSCULAR | Status: AC
  Filled 2023-05-08: qty 5

## 2023-05-08 MED ORDER — DEXMEDETOMIDINE HCL IN NACL 80 MCG/20ML IV SOLN
INTRAVENOUS | Status: DC | PRN
  Administered 2023-05-08: 4 ug via INTRAVENOUS
  Administered 2023-05-08: 8 ug via INTRAVENOUS

## 2023-05-08 MED ORDER — ONDANSETRON HCL 4 MG/2ML IJ SOLN: 4.0000 mg | Freq: Once | INTRAMUSCULAR | Status: DC | PRN

## 2023-05-08 MED ORDER — MIDAZOLAM HCL 2 MG/2ML IJ SOLN
INTRAMUSCULAR | Status: DC | PRN
  Administered 2023-05-08: 2 mg via INTRAVENOUS

## 2023-05-08 MED ORDER — VASHE WOUND IRRIGATION OPTIME
TOPICAL | Status: DC | PRN
  Administered 2023-05-08: 34 [oz_av] via TOPICAL

## 2023-05-08 MED ORDER — CHLORHEXIDINE GLUCONATE CLOTH 2 % EX PADS: 6.0000 | MEDICATED_PAD | Freq: Once | CUTANEOUS | Status: DC

## 2023-05-08 MED ORDER — BUPIVACAINE-EPINEPHRINE (PF) 0.5% -1:200000 IJ SOLN
INTRAMUSCULAR | Status: AC
  Filled 2023-05-08: qty 30

## 2023-05-08 MED ORDER — PROPOFOL 10 MG/ML IV BOLUS
INTRAVENOUS | Status: DC | PRN
  Administered 2023-05-08: 130 mg via INTRAVENOUS

## 2023-05-08 MED ORDER — CELECOXIB 200 MG PO CAPS
ORAL_CAPSULE | ORAL | Status: AC
  Filled 2023-05-08: qty 1

## 2023-05-08 MED ORDER — TECHNETIUM TC 99M TILMANOCEPT KIT
1.0800 | PACK | Freq: Once | INTRAVENOUS | Status: AC | PRN
Start: 2023-05-08 — End: 2023-05-08
  Administered 2023-05-08: 1.08 via INTRADERMAL

## 2023-05-08 MED ORDER — FENTANYL CITRATE (PF) 100 MCG/2ML IJ SOLN
INTRAMUSCULAR | Status: DC | PRN
  Administered 2023-05-08 (×2): 50 ug via INTRAVENOUS
  Administered 2023-05-08 (×2): 25 ug via INTRAVENOUS
  Administered 2023-05-08: 50 ug via INTRAVENOUS
  Administered 2023-05-08: 25 ug via INTRAVENOUS

## 2023-05-08 MED ORDER — DEXAMETHASONE SODIUM PHOSPHATE 10 MG/ML IJ SOLN
INTRAMUSCULAR | Status: AC
  Filled 2023-05-08: qty 1

## 2023-05-08 MED ORDER — DIAZEPAM 5 MG PO TABS: 5.0000 mg | ORAL_TABLET | Freq: Three times a day (TID) | ORAL | Status: DC | PRN

## 2023-05-08 MED ORDER — ONDANSETRON 4 MG PO TBDP: 4.0000 mg | ORAL_TABLET | Freq: Four times a day (QID) | ORAL | Status: DC | PRN

## 2023-05-08 MED ORDER — CEFAZOLIN SODIUM-DEXTROSE 2-4 GM/100ML-% IV SOLN
INTRAVENOUS | Status: AC
  Filled 2023-05-08: qty 100

## 2023-05-08 MED ORDER — BUPIVACAINE-EPINEPHRINE (PF) 0.25% -1:200000 IJ SOLN
INTRAMUSCULAR | Status: AC
  Filled 2023-05-08: qty 30

## 2023-05-08 MED ORDER — STERILE WATER FOR IRRIGATION IR SOLN
Status: DC | PRN
  Administered 2023-05-08: 1000 mL

## 2023-05-08 MED ORDER — OXYCODONE HCL 5 MG PO TABS
ORAL_TABLET | ORAL | Status: AC
  Filled 2023-05-08: qty 1

## 2023-05-08 MED ORDER — DEXAMETHASONE SODIUM PHOSPHATE 10 MG/ML IJ SOLN
INTRAMUSCULAR | Status: DC | PRN
  Administered 2023-05-08: 10 mg via INTRAVENOUS

## 2023-05-08 MED ORDER — CHLORHEXIDINE GLUCONATE 0.12 % MT SOLN
15.0000 mL | Freq: Once | OROMUCOSAL | Status: AC
  Administered 2023-05-08: 15 mL via OROMUCOSAL

## 2023-05-08 MED ORDER — PROPOFOL 10 MG/ML IV BOLUS
INTRAVENOUS | Status: AC
  Filled 2023-05-08: qty 20

## 2023-05-08 MED ORDER — BUPIVACAINE LIPOSOME 1.3 % IJ SUSP
INTRAMUSCULAR | Status: AC
Start: 2023-05-08 — End: ?
  Filled 2023-05-08: qty 20

## 2023-05-08 MED ORDER — SODIUM CHLORIDE (PF) 0.9 % IJ SOLN
INTRAMUSCULAR | Status: DC | PRN
  Administered 2023-05-08 (×2): 20 mL via INTRAMUSCULAR

## 2023-05-08 MED ORDER — ACETAMINOPHEN 500 MG PO TABS
ORAL_TABLET | ORAL | Status: AC
  Filled 2023-05-08: qty 2

## 2023-05-08 MED ORDER — MIDAZOLAM HCL 2 MG/2ML IJ SOLN
INTRAMUSCULAR | Status: AC
  Filled 2023-05-08: qty 2

## 2023-05-08 MED ORDER — PROPOFOL 500 MG/50ML IV EMUL
INTRAVENOUS | Status: DC | PRN
  Administered 2023-05-08: 125 ug/kg/min via INTRAVENOUS

## 2023-05-08 MED ORDER — DEXMEDETOMIDINE HCL IN NACL 80 MCG/20ML IV SOLN
INTRAVENOUS | Status: AC
  Filled 2023-05-08: qty 20

## 2023-05-08 MED ORDER — CEFAZOLIN SODIUM 1 G IJ SOLR
INTRAMUSCULAR | Status: AC
  Filled 2023-05-08: qty 20

## 2023-05-08 MED ORDER — ORAL CARE MOUTH RINSE: 15.0000 mL | Freq: Once | OROMUCOSAL | Status: AC

## 2023-05-08 MED ORDER — FENTANYL CITRATE (PF) 100 MCG/2ML IJ SOLN
25.0000 ug | INTRAMUSCULAR | Status: DC | PRN
  Administered 2023-05-08: 50 ug via INTRAVENOUS

## 2023-05-08 MED ORDER — CEFAZOLIN SODIUM-DEXTROSE 2-4 GM/100ML-% IV SOLN
2.0000 g | Freq: Three times a day (TID) | INTRAVENOUS | Status: DC
  Administered 2023-05-08 – 2023-05-09 (×2): 2 g via INTRAVENOUS
  Filled 2023-05-08 (×3): qty 100

## 2023-05-08 SURGICAL SUPPLY — 81 items
APPLICATOR CHLORAPREP 10 TEAL (MISCELLANEOUS) ×1 IMPLANT
APPLIER CLIP 11 MED OPEN (CLIP) ×1
APPLIER CLIP 9.375 SM OPEN (CLIP)
BAG DECANTER FOR FLEXI CONT (MISCELLANEOUS) ×1 IMPLANT
BIOPATCH WHT 1IN DISK W/4.0 H (GAUZE/BANDAGES/DRESSINGS) ×2 IMPLANT
BLADE BOVIE TIP EXT 4 (BLADE) ×1 IMPLANT
BLADE SURG 15 STRL LF DISP TIS (BLADE) ×2 IMPLANT
BLADE SURG 15 STRL SS (BLADE) ×2
BNDG GAUZE DERMACEA FLUFF 4 (GAUZE/BANDAGES/DRESSINGS) ×2 IMPLANT
CHLORAPREP W/TINT 26 (MISCELLANEOUS) ×1 IMPLANT
CLEANSER WND VASHE 34 (WOUND CARE) ×1 IMPLANT
CLIP APPLIE 11 MED OPEN (CLIP) IMPLANT
CLIP APPLIE 9.375 SM OPEN (CLIP) ×1 IMPLANT
CNTNR URN SCR LID CUP LEK RST (MISCELLANEOUS) ×4 IMPLANT
CONT SPEC 4OZ STRL OR WHT (MISCELLANEOUS) ×4
COVER PROBE GAMMA FINDER SLV (MISCELLANEOUS) ×1 IMPLANT
DERMABOND ADVANCED .7 DNX12 (GAUZE/BANDAGES/DRESSINGS) ×4 IMPLANT
DRAIN CHANNEL JP 15F RND 16 (MISCELLANEOUS) IMPLANT
DRAIN CHANNEL JP 19F (MISCELLANEOUS) IMPLANT
DRAPE INCISE IOBAN 66X45 STRL (DRAPES) ×1 IMPLANT
DRAPE LAPAROTOMY TRNSV 106X77 (MISCELLANEOUS) ×1 IMPLANT
DRSG GAUZE FLUFF 36X18 (GAUZE/BANDAGES/DRESSINGS) ×1 IMPLANT
DRSG MEPILEX FLEX 6X6 (GAUZE/BANDAGES/DRESSINGS) ×2 IMPLANT
DRSG TEGADERM 2-3/8X2-3/4 SM (GAUZE/BANDAGES/DRESSINGS) ×2 IMPLANT
ELECT BLADE 6.5 EXT (BLADE) IMPLANT
ELECT CAUTERY BLADE 6.4 (BLADE) ×2 IMPLANT
ELECT CAUTERY BLADE TIP 2.5 (TIP) ×1
ELECT REM PT RETURN 9FT ADLT (ELECTROSURGICAL) ×1
ELECTRODE CAUTERY BLDE TIP 2.5 (TIP) ×1 IMPLANT
ELECTRODE REM PT RTRN 9FT ADLT (ELECTROSURGICAL) ×1 IMPLANT
EVACUATOR SILICONE 100CC (DRAIN) IMPLANT
GAUZE 4X4 16PLY ~~LOC~~+RFID DBL (SPONGE) ×1 IMPLANT
GLOVE BIO SURGEON STRL SZ 6.5 (GLOVE) ×4 IMPLANT
GLOVE BIO SURGEON STRL SZ7 (GLOVE) ×1 IMPLANT
GOWN STRL REUS W/ TWL LRG LVL3 (GOWN DISPOSABLE) ×6 IMPLANT
GOWN STRL REUS W/TWL LRG LVL3 (GOWN DISPOSABLE) ×6
HEMOSTAT ARISTA ABSORB 3G PWDR (HEMOSTASIS) IMPLANT
IMPL EXPANDER BREAST 455CC (Breast) IMPLANT
IMPLANT EXPANDER BREAST 455CC (Breast) ×2 IMPLANT
IV NS 1000ML (IV SOLUTION)
IV NS 1000ML BAXH (IV SOLUTION) IMPLANT
IV NS 500ML (IV SOLUTION)
IV NS 500ML BAXH (IV SOLUTION) IMPLANT
KIT FILL ASEPTIC TRANSFER (MISCELLANEOUS) ×1 IMPLANT
KIT MARKER MARGIN INK (KITS) IMPLANT
KIT TURNOVER KIT A (KITS) ×1 IMPLANT
LABEL OR SOLS (LABEL) ×1 IMPLANT
LIGHT WAVEGUIDE WIDE FLAT (MISCELLANEOUS) IMPLANT
MANIFOLD NEPTUNE II (INSTRUMENTS) ×2 IMPLANT
NDL FILTER BLUNT 18X1 1/2 (NEEDLE) ×3 IMPLANT
NDL HYPO 22X1.5 SAFETY MO (MISCELLANEOUS) ×2 IMPLANT
NEEDLE FILTER BLUNT 18X1 1/2 (NEEDLE) ×1
NEEDLE HYPO 22X1.5 SAFETY MO (MISCELLANEOUS) ×3
PACK BASIN MAJOR ARMC (MISCELLANEOUS) ×2 IMPLANT
PACK UNIVERSAL (MISCELLANEOUS) IMPLANT
PAD ABD DERMACEA PRESS 5X9 (GAUZE/BANDAGES/DRESSINGS) ×4 IMPLANT
PIN SAFETY STRL (MISCELLANEOUS) ×1 IMPLANT
SOL PREP PVP 2OZ (MISCELLANEOUS)
SOLUTION PREP PVP 2OZ (MISCELLANEOUS) ×2 IMPLANT
SPIKE FLUID TRANSFER (MISCELLANEOUS) IMPLANT
SPONGE DRAIN TRACH 4X4 STRL 2S (GAUZE/BANDAGES/DRESSINGS) ×1 IMPLANT
SPONGE T-LAP 18X18 ~~LOC~~+RFID (SPONGE) ×4 IMPLANT
STRIP SUTURE WOUND CLOSURE 1/2 (MISCELLANEOUS) ×2 IMPLANT
SUT ETH BLK MONO 3 0 FS 1 12/B (SUTURE) IMPLANT
SUT MNCRL 3-0 UNDYED SH (SUTURE) ×2 IMPLANT
SUT MNCRL 4-0 (SUTURE) ×4
SUT MNCRL 4-0 27XMFL (SUTURE) ×4
SUT MNCRL+ 5-0 UNDYED PC-3 (SUTURE) ×2 IMPLANT
SUT PDS II 3-0 (SUTURE) IMPLANT
SUT PDS PLUS AB 2-0 CT-1 (SUTURE) ×6 IMPLANT
SUT SILK 4 0 SH (SUTURE) ×2 IMPLANT
SUT VIC AB 2-0 CT1 (SUTURE) ×4 IMPLANT
SUT VIC AB 3-0 SH 27 (SUTURE) ×1
SUT VIC AB 3-0 SH 27X BRD (SUTURE) IMPLANT
SUTURE MNCRL 4-0 27XMF (SUTURE) ×3 IMPLANT
SYR 10ML LL (SYRINGE) ×3 IMPLANT
SYR BULB IRRIG 60ML STRL (SYRINGE) ×1 IMPLANT
TISSUE FLEXHD PERF PLIAB 8X16 (Tissue) IMPLANT
TOWEL OR 17X26 4PK STRL BLUE (TOWEL DISPOSABLE) ×1 IMPLANT
TRAP FLUID SMOKE EVACUATOR (MISCELLANEOUS) ×2 IMPLANT
WATER STERILE IRR 500ML POUR (IV SOLUTION) ×2 IMPLANT

## 2023-05-08 NOTE — Op Note (Signed)
Pre-operative Diagnosis: Right Triple negative  Breast Cancer, prior hx bilateral breast augmentation    Post-operative Diagnosis: Same   Surgeon: Sterling Big,  MD FACS  Anesthesia: GETA  Procedure:  Right Nipple sparing Simple mastectomy Right sentinel node biopsy Left Nipple sparing simple Mastectomy Removal of port-a-cath   Findings: Right breast implant w rupture Evidence of significant scaring on the left breast from prior surgeries and biopsies Two right axillary sentinel lymph nodes both blue and hot w/o evidence of metastatic disease grossly   Estimated Blood Loss: 50cc          Drains: None         Specimens: bilateral mastectomies, port and sentinel nodes x 2       Complications: none                 Condition: Stable   Operation performed with curative intent:Yes   Tracer(s) used to identify sentinel nodes in the upfront surgery (non-neoadjuvant) setting (select all that apply):Dye and Radioactive Tracer   Tracer(s) used to identify sentinel nodes in the neoadjuvant setting (select all that apply):N/A   All nodes (colored or non-colored) present at the end of a dye-filled lymphatic channel were removed:Yes    All significantly radioactive nodes were removed:Yes   All palpable suspicious nodes were removed:Yes   Biopsy-proven positive nodes marked with clips prior to chemotherapy were identified and removed:N/A  Procedure Details  The patient was seen again in the Holding Room. The benefits, complications, treatment options, and expected outcomes were discussed with the patient. The risks of bleeding, infection, recurrence of symptoms, failure to resolve symptoms, hematoma, seroma, open wound, cosmetic deformity, and the need for further surgery were discussed.  The patient was taken to Operating Room, identified  and the procedure verified.  A Time Out was held and the above information confirmed.  Prior to the induction of general anesthesia, antibiotic  prophylaxis was administered. VTE prophylaxis was in place. Appropriate anesthesia was then administered and tolerated well. Isosulfan blue dye was injected periareolar early under aseptic conditions on her right breast   The chest and axillas  were prepped with Chloraprep and draped in the sterile fashion. The patient was positioned in the supine position.   I started on the left side ( non cancerous side). Inframammary fold incision created. Subcutaneous flaps were created after dividing cooper's ligament within the breast parenchyma. We used meticulous dissection and paid significant attention to the thickness of the flaps as to not compromise the blood flow to the nipple.  Our dissection proved to be very challenging due to the fact that the patient was very athletic and a small and she had prior implants.  The remaining of her native breast tissue was a small and the skin was under significant tension.  This made the nipple sparing mastectomy very difficult.  Please note that a modifier 22 was applied due to the fact that the dissection require significant additional operative time.  Our margins of excisions inferiorly were the mammary fold medially was the sternum lateral the latissimus dorsi and superiorly to the clavicle.  Because of the difficulty that we had resected the superior breast tissue I actually asked Dr. Ulice Bold from plastic surgery to remove the implant first. She came in and removed the implant , see her separate operative note for further details.  This facilitated the  dissection of the superior pedicle.  This note that we used lighted retractors.  We were able to divide the tail  of the breast using electrocautery.  The breast tissue was taken from the pectoralis fascia in the standard fashion.  The specimen was excised and labeled for permanent pathology. Patient wanted to for me to reexcised the port.  We were able to obtain exposure to the port via the mastectomy incision and the  chest wall tissue was excised the port was removed and pressure was held in the left internal jugular vein for adequate hemostasis.  Case was then turned to Dr. Ulice Bold from plastic surgery for reconstruction and closure.  Attention was turned to the right side. Using the hand-held probe an area of high counts was identified in the axilla, an incision was made and direction by the probe aided in dissection of  lymph nodes . I clipped the small lymphatic channels in the standard fashion. A total of 2 nodes that were both hot and blue were excised.  Nodes were sent for permanent pathology  A Right Inframammary fold incision created. Subcutaneous flaps were created after dividing cooper's ligament within the breast parenchyma. We used meticulous dissection and paid significant attention to the thickness of the flaps as to not compromise the blood flow to the nipple.  Our dissection proved to be very challenging due to the fact that the patient was very athletic and a small and she had prior implants.  The remaining of her native breast tissue was a small and the skin was under significant tension.  This made the nipple sparing mastectomy very difficult.  Please note that a modifier 22 was applied due to the fact that the dissection require significant additional operative time.  Our margins of excisions inferiorly were the mammary fold medially was the sternum lateral the latissimus dorsi and superiorly to the clavicle.  Because of the difficulty that we had resected the superior breast tissue I actually asked Dr. Ulice Bold from plastic surgery to remove the implant first. She came in and removed the implant , see her separate operative note for further details.  This facilitated the  dissection of the superior pedicle.  This note that we used lighted retractors.  We were able to divide the tail of the breast using electrocautery.  The breast tissue was taken from the pectoralis fascia in the standard  fashion.  The specimen was excised and labeled for permanent pathology. Case was then turned to Dr. Ulice Bold from plastic surgery for reconstruction and closure. Please note that before turning the case to the plastic surgery team I double checked that the hemostasis was excellent.  Patient was taken to the recovery room in stable condition.   Sterling Big , MD, FACS

## 2023-05-08 NOTE — Op Note (Signed)
Op report    DATE OF OPERATION:  05/08/2023  LOCATION: [facility]  SURGICAL DIVISION: Plastic Surgery  PREOPERATIVE DIAGNOSES:  1. Right Breast cancer.    POSTOPERATIVE DIAGNOSES:  1. Right Breast cancer.   PROCEDURE:  1. Bilateral immediate breast reconstruction with placement of Acellular Dermal Matrix and tissue expanders.  SURGEON: Foster Simpson, DO  ASSISTANT: Keenan Bachelor, PA  ANESTHESIA:  General.   COMPLICATIONS: None.   IMPLANTS: Left - Mentor 455 cc. Ref #[id number].  Serial Number [id number], 150 cc of injectable saline placed in the expander. Right - Mentor 455 cc. Ref #[id number].  Serial Number [id number], 150 cc of injectable saline placed in the expander. Acellular Dermal Matrix 8 x 16 cm x two Flex HD  INDICATIONS FOR PROCEDURE:  The patient, Eileen Hart, is a 32 y.o. female born on September 20, 1990, is here for immediate first stage breast reconstruction with placement of bilateral tissue expander and Acellular dermal matrix. MRN: [id number]  CONSENT:  Informed consent was obtained directly from the patient. Risks, benefits and alternatives were fully discussed. Specific risks including but not limited to bleeding, infection, hematoma, seroma, scarring, pain, implant infection, implant extrusion, capsular contracture, asymmetry, wound healing problems, and need for further surgery were all discussed. The patient did have an ample opportunity to have her questions answered to her satisfaction.   DESCRIPTION OF PROCEDURE:  The patient was taken to the operating room by the general surgery team. SCDs were placed and IV antibiotics were given. The patient's chest was prepped and draped in a sterile fashion. A time out was performed and the implants to be used were identified.  Bilateral mastectomies were performed.  Once the general surgery team had completed their portion of the case the patient was rendered to the plastic and reconstructive  surgery team.  Left:  The pectoralis major muscle was already lifted due to the implant that had been placed ~9 years ago.  The pocket was irrigate with Vashe.  Hemostasis was achieved with electrocautery.  The ADM was then prepared according to the manufacture guidelines and slits placed to help with postoperative fluid management.  The ADM was then sutured to the inferior and lateral edge of the inframammary fold with 2-0 PDS starting with a running stitch.  The lateral portion was sutured to the chest wall after the expander was placed.  The expander was prepared according to the manufacture guidelines, the air evacuated and then it was placed under the ADM and pectoralis major muscle.  Attention was given to the inferior and medial poll of each pocket so as to achieve better positioning of the expander. The inferior and lateral tabs were used to secure the expander to the chest wall with 2-0 PDS.  The drain was placed at the inframammary fold over the ADM and secured to the skin with 3-0 Silk. The deep layers were closed with 3-0 PDS.  The skin was closed with 4-0 Monocryl and then dermabond was applied.    Right:The pectoralis major muscle was already lifted due to the implant that had been placed previously. However it was deflated some time before this case.  This contributed to the tighter pocket on the right.  The pocket was irrigate with Vashe.  Hemostasis was achieved with electrocautery.  The ADM was then prepared according to the manufacture guidelines and slits placed to help with postoperative fluid management.  The ADM was then sutured to the inferior and lateral edge of the inframammary fold with 2-0 PDS starting  with a running stitch.  The lateral portion was sutured to the chest wall after the expander was placed.  The expander was prepared according to the manufacture guidelines, the air evacuated and then it was placed under the ADM and pectoralis major muscle.  The inferior and lateral tabs  were used to secure the expander to the chest wall with 2-0 PDS.  The drain was placed at the inframammary fold over the ADM and secured to the skin with 4-0 Silk. Experel was placed in each pocket.  Arista was placed on the right.  The axillary incision was closed with the 3-0 Monocryl deep and then the 4-0 Monocryl.  The deep layers were closed with 3-0 PDS.  The skin was closed with 4-0 Monocryl and then dermabond was applied.    The ABDs and breast binder were placed.  The patient tolerated the procedure well and there were no complications.  The patient was allowed to wake from anesthesia and taken to the recovery room in satisfactory condition.   The advanced practice practitioner (APP) assisted throughout the case.  The APP was essential in retraction and counter traction when needed to make the case progress smoothly.  This retraction and assistance made it possible to see the tissue plans for the procedure.  The assistance was needed for blood control, tissue re-approximation and assisted with closure of the incision site.

## 2023-05-08 NOTE — Plan of Care (Signed)

## 2023-05-08 NOTE — Transfer of Care (Signed)
Immediate Anesthesia Transfer of Care Note  Patient: Eileen Hart  Procedure(s) Performed: MASTECTOMY Simple WITH SENTINEL LYMPH NODE BIOPSY, RNFA to assist (Bilateral: Breast) Bilateral immediate breast reconstruction with expanders and Flex HD placement (Bilateral: Breast)  Patient Location: PACU  Anesthesia Type:General  Level of Consciousness: awake, alert , and oriented  Airway & Oxygen Therapy: Patient Spontanous Breathing  Post-op Assessment: Report given to RN and Post -op Vital signs reviewed and stable  Post vital signs: Reviewed and stable  Last Vitals:  Vitals Value Taken Time  BP 98/57 05/08/23 1433  Temp    Pulse 76 05/08/23 1439  Resp 31 05/08/23 1439  SpO2 99 % 05/08/23 1439  Vitals shown include unfiled device data.  Last Pain:  Vitals:   05/08/23 0850  TempSrc: Temporal  PainSc: 0-No pain         Complications: No notable events documented.

## 2023-05-08 NOTE — Anesthesia Postprocedure Evaluation (Signed)
Anesthesia Post Note  Patient: Eileen Hart  Procedure(s) Performed: MASTECTOMY Simple WITH SENTINEL LYMPH NODE BIOPSY, RNFA to assist (Bilateral: Breast) Bilateral immediate breast reconstruction with expanders and Flex HD placement (Bilateral: Breast)  Patient location during evaluation: PACU Anesthesia Type: General Level of consciousness: awake and alert Pain management: satisfactory to patient Vital Signs Assessment: post-procedure vital signs reviewed and stable Respiratory status: spontaneous breathing and nonlabored ventilation Cardiovascular status: stable Anesthetic complications: no   No notable events documented.   Last Vitals:  Vitals:   05/08/23 1435 05/08/23 1445  BP: (!) 98/57 99/62  Pulse: 74 73  Resp: 18 17  Temp: 36.7 C   SpO2: 100% 99%    Last Pain:  Vitals:   05/08/23 1435  TempSrc:   PainSc: Asleep                 VAN STAVEREN,Jeramie Scogin

## 2023-05-08 NOTE — Anesthesia Preprocedure Evaluation (Addendum)
Anesthesia Evaluation  Patient identified by MRN, date of birth, ID band Patient awake    Reviewed: Allergy & Precautions, NPO status , Patient's Chart, lab work & pertinent test results  History of Anesthesia Complications Negative for: history of anesthetic complications  Airway Mallampati: II  TM Distance: >3 FB Neck ROM: Full    Dental no notable dental hx. (+) Teeth Intact   Pulmonary neg sleep apnea, neg COPD, Patient abstained from smoking.Not current smoker, former smoker   Pulmonary exam normal breath sounds clear to auscultation       Cardiovascular Exercise Tolerance: Good METShypertension, (-) CAD and (-) Past MI (-) dysrhythmias  Rhythm:Regular Rate:Normal - Systolic murmurs    Neuro/Psych  PSYCHIATRIC DISORDERS Anxiety Depression    negative neurological ROS     GI/Hepatic ,neg GERD  ,,(+)     (-) substance abuse    Endo/Other  neg diabetes    Renal/GU negative Renal ROS     Musculoskeletal   Abdominal   Peds  Hematology   Anesthesia Other Findings Past Medical History: 01/25/2010: Chlamydia No date: Depression with anxiety No date: Gestational hypertension No date: Immunization, viral disease     Comment:  gardasil series completed No date: Invasive carcinoma of breast (HCC) 05/02/2016: Tobacco abuse  Reproductive/Obstetrics                              Anesthesia Physical Anesthesia Plan  ASA: 2  Anesthesia Plan: General   Post-op Pain Management: Tylenol PO (pre-op)*, Celebrex PO (pre-op)* and Regional block*   Induction: Intravenous  PONV Risk Score and Plan: 4 or greater and Ondansetron, Dexamethasone, Midazolam, Propofol infusion and TIVA  Airway Management Planned: Oral ETT and Video Laryngoscope Planned  Additional Equipment: None  Intra-op Plan:   Post-operative Plan: Extubation in OR  Informed Consent: I have reviewed the patients History and  Physical, chart, labs and discussed the procedure including the risks, benefits and alternatives for the proposed anesthesia with the patient or authorized representative who has indicated his/her understanding and acceptance.     Dental advisory given  Plan Discussed with: CRNA and Surgeon  Anesthesia Plan Comments: (Discussed risks of anesthesia with patient, including PONV, sore throat, lip/dental/eye damage. Rare risks discussed as well, such as cardiorespiratory and neurological sequelae, and allergic reactions. Discussed the role of CRNA in patient's perioperative care. Patient understands.  Discussed with patient the r/b/a of Erector Spinae Plane (ESP) block, including:  - Bleeding - Infection - Damage to surrounding structures such as nerves and blood vessels - Pneumothorax - Reactions to injectate - Poor or non-functioning block.  Patient understands and would consider the block post-op if her pain control was suboptimal.  )         Anesthesia Quick Evaluation

## 2023-05-08 NOTE — Interval H&P Note (Signed)
History and Physical Interval Note:  05/08/2023 8:15 AM  Candice Camp  has presented today for surgery, with the diagnosis of right breast cancer.  The various methods of treatment have been discussed with the patient and family. After consideration of risks, benefits and other options for treatment, the patient has consented to  Procedure(s): MASTECTOMY Simple WITH SENTINEL LYMPH NODE BIOPSY, RNFA to assist (Bilateral) Bilateral immediate breast reconstruction with expanders and Flex HD placement (Bilateral) as a surgical intervention.  The patient's history has been reviewed, patient examined, no change in status, stable for surgery.  I have reviewed the patient's chart and labs.  Questions were answered to the patient's satisfaction.     Alena Bills Alexandr Oehler

## 2023-05-08 NOTE — Anesthesia Procedure Notes (Signed)
Procedure Name: Intubation Date/Time: 05/08/2023 9:20 AM  Performed by: Karoline Caldwell, CRNAPre-anesthesia Checklist: Patient identified, Patient being monitored, Timeout performed, Emergency Drugs available and Suction available Patient Re-evaluated:Patient Re-evaluated prior to induction Oxygen Delivery Method: Circle system utilized Preoxygenation: Pre-oxygenation with 100% oxygen Induction Type: IV induction Ventilation: Mask ventilation without difficulty Laryngoscope Size: 3 and McGrath Grade View: Grade I Tube type: Oral Tube size: 6.5 mm Number of attempts: 1 Airway Equipment and Method: Stylet Placement Confirmation: ETT inserted through vocal cords under direct vision, positive ETCO2 and breath sounds checked- equal and bilateral Secured at: 19 cm Tube secured with: Tape Dental Injury: Teeth and Oropharynx as per pre-operative assessment

## 2023-05-08 NOTE — Interval H&P Note (Signed)
History and Physical Interval Note:  05/08/2023 8:34 AM  Candice Camp  has presented today for surgery, with the diagnosis of right breast cancer.  The various methods of treatment have been discussed with the patient and family. After consideration of risks, benefits and other options for treatment, the patient has consented to  Procedure(s): MASTECTOMY Simple WITH SENTINEL LYMPH NODE BIOPSY, RNFA to assist (Bilateral) Bilateral immediate breast reconstruction with expanders and Flex HD placement (Bilateral) as a surgical intervention.  The patient's history has been reviewed, patient examined, no change in status, stable for surgery.  I have reviewed the patient's chart and labs.  Questions were answered to the patient's satisfaction.     Azia Toutant F Onita Pfluger

## 2023-05-09 ENCOUNTER — Observation Stay: Payer: Medicaid Other

## 2023-05-09 ENCOUNTER — Other Ambulatory Visit: Payer: Self-pay | Admitting: Oncology

## 2023-05-09 ENCOUNTER — Ambulatory Visit
Admission: RE | Admit: 2023-05-09 | Discharge: 2023-05-09 | Disposition: A | Discharge status: Home / Self Care | Payer: Medicaid Other | Source: Ambulatory Visit | Attending: Oncology | Admitting: Oncology

## 2023-05-09 ENCOUNTER — Encounter: Payer: Self-pay | Admitting: Surgery

## 2023-05-09 DIAGNOSIS — C50919 Malignant neoplasm of unspecified site of unspecified female breast: Secondary | ICD-10-CM

## 2023-05-09 DIAGNOSIS — C50411 Malignant neoplasm of upper-outer quadrant of right female breast: Secondary | ICD-10-CM | POA: Diagnosis not present

## 2023-05-09 LAB — COMPREHENSIVE METABOLIC PANEL
ALT: 25 U/L (ref 0–44)
AST: 28 U/L (ref 15–41)
Albumin: 3.6 g/dL (ref 3.5–5.0)
Alkaline Phosphatase: 43 U/L (ref 38–126)
Anion gap: 9 (ref 5–15)
BUN: 14 mg/dL (ref 6–20)
CO2: 25 mmol/L (ref 22–32)
Calcium: 8.9 mg/dL (ref 8.9–10.3)
Chloride: 104 mmol/L (ref 98–111)
Creatinine, Ser: 0.75 mg/dL (ref 0.44–1.00)
GFR, Estimated: >60 mL/min (ref >60)
Glucose, Bld: 135 mg/dL — ABNORMAL HIGH (ref 70–99)
Potassium: 4.4 mmol/L (ref 3.5–5.1)
Sodium: 138 mmol/L (ref 135–145)
Total Bilirubin: 0.5 mg/dL (ref <1.2)
Total Protein: 6.3 g/dL — ABNORMAL LOW (ref 6.5–8.1)

## 2023-05-09 LAB — CBC
HCT: 31.1 % — ABNORMAL LOW (ref 36.0–46.0)
Hemoglobin: 10.6 g/dL — ABNORMAL LOW (ref 12.0–15.0)
MCH: 31.3 pg (ref 26.0–34.0)
MCHC: 34.1 g/dL (ref 30.0–36.0)
MCV: 91.7 fL (ref 80.0–100.0)
Platelets: 155 10*3/uL (ref 150–400)
RBC: 3.39 MIL/uL — ABNORMAL LOW (ref 3.87–5.11)
RDW: 11.6 % (ref 11.5–15.5)
WBC: 9 10*3/uL (ref 4.0–10.5)
nRBC: 0 % (ref 0.0–0.2)

## 2023-05-09 LAB — HIV ANTIBODY (ROUTINE TESTING W REFLEX): HIV Screen 4th Generation wRfx: NONREACTIVE

## 2023-05-09 NOTE — Discharge Summary (Signed)
[facility] SURGICAL DISCHARGE SUMMARY  Patient ID: Eileen Hart MRN: [id number] DOB/AGE: January 16, 1991 32 y.o.  Admit date: 05/08/2023 Discharge date: 05/09/2023  Discharge Diagnoses Patient Active Problem List   Diagnosis Date Noted   S/P bilateral mastectomy 05/08/2023   Genetic testing 10/30/2022   Invasive carcinoma of breast (HCC) 10/22/2022   Malignant neoplasm of upper-outer quadrant of right breast in female, estrogen receptor negative (HCC) 10/22/2022   Lump of right breast 09/18/2022    Consultants Plastic Surgery   Procedures 05/08/2023 - [place]:  Bilateral nipple sparing mastectomy Removal of port-a-catheter   05/08/2023 - [place]: Bilateral immediate breast reconstruction with placement of Acellular Dermal Matrix and tissue expanders.   HPI: Eileen Hart is a 32 y.o. female with history of triple negative right breast cancer who presents to [facility] on 11/14 for scheduled bilateral mastectomy with immediate reconstruction.    Hospital Course: Informed consent was obtained and documented, and patient underwent uneventful bilateral mastectomy with immediate reconstruction. (Dr Everlene Farrier and Dr Ulice Bold, 05/08/2023).  Post-operatively, patient did well. Drain output was minimal and serosanguinous. No evidence of hematoma. The remainder of patient's hospital course was essentially unremarkable, and discharge planning was initiated accordingly with patient safely able to be discharged home with appropriate discharge instructions, pain control, and outpatient follow-up after all of her questions were answered to her expressed satisfaction.   Discharge Condition: Good   Physical Examination:  Constitutional: Well appearing female; NAD Pulmonary: Normal effort, no respiratory distress Skin: Chaperone present, bilateral mastectomy incisions are CDI; mediplex in place, no swelling. Bilateral drains are serosanguinous   Allergies as of 05/09/2023   No  Known Allergies      Medication List     TAKE these medications    acetaminophen 500 MG tablet Commonly known as: TYLENOL Take 1,000 mg by mouth every 6 (six) hours as needed for moderate pain (pain score 4-6).   diazepam 2 MG tablet Commonly known as: Valium Take 1 tablet (2 mg total) by mouth every 12 (twelve) hours as needed for muscle spasms.   gabapentin 300 MG capsule Commonly known as: Neurontin Take 1 capsule (300 mg total) by mouth 3 (three) times daily.   ibuprofen 200 MG tablet Commonly known as: ADVIL Take 600-800 mg by mouth every 6 (six) hours as needed for moderate pain (pain score 4-6).          Follow-up Information     Leafy Ro, MD. Go on 05/19/2023.   Specialty: General Surgery Why: Go to appointment on 11/25 at 900 AM Contact information: [address] [place] [address] [phone]         Peggye Form, DO. Go on 05/16/2023.   Specialty: Plastic Surgery Why: Go to appointment on 11/22 at 11 AM Contact information: [address] [place] [address] [phone]         Creig Hines, MD. Go on 05/21/2023.   Specialty: Oncology Why: Go to appointment on 11/27 at 930 AM Contact information: [address] [place] [address] [phone]                  Time spent on discharge management including discussion of hospital course, clinical condition, outpatient instructions, prescriptions, and follow up with the patient and members of the medical team: >30 minutes  -- Lynden Oxford , PA-C [facility]  05/09/2023, 9:30 AM [phone] M-F: 7am - 4pm

## 2023-05-09 NOTE — Plan of Care (Signed)
Resolve plan of care.

## 2023-05-09 NOTE — TOC CM/SW Note (Signed)
Transition of Care [facility]) - Inpatient Brief Assessment   Patient Details  Name: ALLETA BARILLA MRN: [id number] Date of Birth: 14-Oct-1990  Transition of Care [facility] CM/SW Contact:    Margarito Liner, LCSW Phone Number: 05/09/2023, 9:23 AM   Clinical Narrative: Patient has orders to discharge home today. Chart reviewed. No TOC needs identified. CSW signing off.  Transition of Care Asessment: Insurance and Status: Insurance coverage has been reviewed Patient has primary care physician: Yes Home environment has been reviewed: Single family home Prior level of function:: Not documented Prior/Current Home Services: No current home services Social Determinants of Health Reivew: SDOH reviewed no interventions necessary Readmission risk has been reviewed: Yes Transition of care needs: no transition of care needs at this time

## 2023-05-16 ENCOUNTER — Encounter: Payer: Self-pay | Admitting: Plastic Surgery

## 2023-05-16 ENCOUNTER — Encounter: Payer: Self-pay | Admitting: *Deleted

## 2023-05-16 ENCOUNTER — Ambulatory Visit (INDEPENDENT_AMBULATORY_CARE_PROVIDER_SITE_OTHER): Payer: Medicaid Other | Admitting: Plastic Surgery

## 2023-05-16 VITALS — BP 120/68 | HR 74 | Ht 64.0 in | Wt 120.4 lb

## 2023-05-16 DIAGNOSIS — C50411 Malignant neoplasm of upper-outer quadrant of right female breast: Secondary | ICD-10-CM

## 2023-05-16 DIAGNOSIS — Z171 Estrogen receptor negative status [ER-]: Secondary | ICD-10-CM

## 2023-05-16 LAB — SURGICAL PATHOLOGY

## 2023-05-16 NOTE — Progress Notes (Signed)
Called to check on Eileen Hart since she had surgery last week.  She is doing well, no complaints.  She sees Dr. Smith Robert on Wednesday.

## 2023-05-17 ENCOUNTER — Encounter: Payer: Self-pay | Admitting: Plastic Surgery

## 2023-05-17 NOTE — Progress Notes (Signed)
   Subjective:    Patient ID: Eileen Hart, female    DOB: 12-02-1990, 32 y.o.   MRN: [id number]  The patient is a 32 year old female here for postop follow-up from bilateral mastectomies with expander placement.  She had 150 cc placed in each expander at the time of surgery.  The right side was a little bit tight because it had deflated.  The drain output is minimal.  The patient is doing really well.  No signs of infection or hematoma.  Her skin flaps look good.      Review of Systems  Constitutional: Negative.   Eyes: Negative.   Respiratory: Negative.    Cardiovascular: Negative.        Objective:   Physical Exam Constitutional:      Appearance: Normal appearance.  HENT:     Head: Atraumatic.  Cardiovascular:     Rate and Rhythm: Normal rate.     Pulses: Normal pulses.  Pulmonary:     Effort: Pulmonary effort is normal.  Musculoskeletal:        General: Tenderness present.  Skin:    General: Skin is warm.     Capillary Refill: Capillary refill takes less than 2 seconds.     Coloration: Skin is not jaundiced.     Findings: Bruising present.  Neurological:     Mental Status: She is oriented to person, place, and time.  Psychiatric:        Mood and Affect: Mood normal.        Behavior: Behavior normal.        Thought Content: Thought content normal.        Judgment: Judgment normal.         Assessment & Plan:     ICD-10-CM   1. Malignant neoplasm of upper-outer quadrant of right breast in female, estrogen receptor negative (HCC)  C50.411    Z17.1       We placed injectable saline in the Expander using a sterile technique: Right: 50 cc for a total of 200 / 455 cc Left: 50 cc for a total of 200 / 455 cc

## 2023-05-19 ENCOUNTER — Encounter: Payer: Self-pay | Admitting: Surgery

## 2023-05-19 ENCOUNTER — Ambulatory Visit (INDEPENDENT_AMBULATORY_CARE_PROVIDER_SITE_OTHER): Payer: Medicaid Other | Admitting: Surgery

## 2023-05-19 VITALS — BP 110/76 | HR 76 | Temp 98.0°F | Ht 64.0 in | Wt 119.0 lb

## 2023-05-19 DIAGNOSIS — Z171 Estrogen receptor negative status [ER-]: Secondary | ICD-10-CM

## 2023-05-19 DIAGNOSIS — C50411 Malignant neoplasm of upper-outer quadrant of right female breast: Secondary | ICD-10-CM

## 2023-05-19 DIAGNOSIS — Z09 Encounter for follow-up examination after completed treatment for conditions other than malignant neoplasm: Secondary | ICD-10-CM

## 2023-05-19 NOTE — Patient Instructions (Signed)
Breast Reconstruction With Implant or Tissue Expander Insertion, Care After The following information offers guidance on how to care for yourself after your procedure. Your health care provider may also give you more specific instructions. If you have problems or questions, contact your health care provider. What can I expect after the procedure? After the procedure, it is common to have: Pain or tenderness. Tiredness. Bruising or swelling. Scarring. Drainage. Loss of sensation or changed sensation in the reconstructed breast or nipple area. A breast that has an abnormal shape or feel. The shape and feel of the reconstructed breast will improve over time as the swelling decreases. Follow these instructions at home: Incision care  Keep the area around your incisions clean by gently washing with soap, water, and a gauze pad or washcloth or as told by your health care provider. Follow instructions from your health care provider about how to take care of your incisions. Make sure you: Wash your hands with soap and water for at least 20 seconds before you change your bandage (dressing). If soap and water are not available, use hand sanitizer. Change your dressing as told by your health care provider. Leave stitches (sutures), skin glue, or adhesive strips in place. These skin closures may need to stay in place for 2 weeks or longer. If adhesive strip edges start to loosen and curl up, you may trim the loose edges. Do not remove adhesive strips completely unless your health care provider tells you to do that. Check your incision area every day for signs of infection. Check for: Redness, swelling, or pain. Fluid or blood. Warmth. Pus or a bad smell. If you were sent home with a surgical drain in place, follow instructions from your health care provider about: How to empty it. How to care for it at home. Activity  Take short walks every 1-2 hours during the day. Ask for help if needed. Do not  lift your arms above your head for 4-6 weeks. Avoid activities that take a lot of effort. Do not lift anything that is heavier than 10 lb (4.5 kg), or the limit that you are told, until your health care provider says that it is safe. Bathing Do not take baths, swim, or use a hot tub while the drains are in place. After the drains are removed, you may shower if your health care provider approves. You may only be allowed to take sponge baths. General instructions Do not use any products that contain nicotine or tobacco. These products include cigarettes, chewing tobacco, and vaping devices, such as e-cigarettes. These can delay incision healing after surgery. If you need help quitting, ask your health care provider. Take over-the-counter and prescription medicines only as told by your health care provider. Wear a supportive undergarment or bra as directed by your health care provider. Ask your health care provider if the medicine prescribed to you: Requires you to avoid driving or using machinery. Can cause constipation. You may need to take these actions to prevent or treat constipation: Drink enough fluid to keep your urine pale yellow. Take over-the-counter or prescription medicines. Eat foods that are high in fiber, such as beans, whole grains, and fresh fruits and vegetables. Limit foods that are high in fat and processed sugars, such as fried and sweet foods. Join a support group for people who have had breast reconstruction. Talk with a mental health professional if you are having trouble coping with your emotions. Keep all follow-up visits. This is important. Contact a health care  provider if: You have a fever. You have redness, swelling, or pain in your incision area. You have fluid or blood coming from your incision. Your incision area feels warm to the touch. You have pus or a bad smell coming from your incision. Your pain is not going away or it is getting worse. You notice unusual  lumps or changes in the area where you had surgery. Get help right away if: You feel short of breath. You have an irregular heartbeat. You have chest pain. These symptoms may be an emergency. Get help right away. Call 911. Do not wait to see if the symptoms will go away. Do not drive yourself to the hospital. Summary After the procedure, it is common to have some pain and tenderness. Keep the area around your incisions clean by gently washing with soap, water, and a gauze pad or washcloth or as told by your health care provider. Check your incision area every day for signs of infection. Contact a health care provider if you have any signs of infection, your pain is not going away, or you notice unusual lumps or changes in the area where you had surgery. Keep all follow-up visits. This is important. This information is not intended to replace advice given to you by your health care provider. Make sure you discuss any questions you have with your health care provider. Document Revised: 04/11/2021 Document Reviewed: 04/11/2021 Elsevier Patient Education  2024 [employer].

## 2023-05-20 NOTE — Progress Notes (Signed)
Eileen Hart is 11days out from bilateral mastectomies with reconstruction.  She has done really well.  No fevers no chills she still has drains that are being managed by plastics. I discussed with her and her mom in detail.  Margins are negative as are the lymph nodes.  Benign disease on the left.  PE NAD Wounds are healing well.  There is no evidence of infection.  Serous drainage from both sides.  The nipples are viable and no evidence of necrosis  A/p Doing very well She is appreciative F/u w oncology tomorrow RTC 2-3 months

## 2023-05-21 ENCOUNTER — Encounter: Payer: Self-pay | Admitting: Oncology

## 2023-05-21 ENCOUNTER — Inpatient Hospital Stay: Payer: Medicaid Other

## 2023-05-21 ENCOUNTER — Inpatient Hospital Stay (HOSPITAL_BASED_OUTPATIENT_CLINIC_OR_DEPARTMENT_OTHER): Payer: Medicaid Other | Admitting: Oncology

## 2023-05-21 VITALS — BP 121/87 | HR 97 | Temp 96.7°F | Resp 18 | Wt 120.0 lb

## 2023-05-21 DIAGNOSIS — C50919 Malignant neoplasm of unspecified site of unspecified female breast: Secondary | ICD-10-CM

## 2023-05-21 DIAGNOSIS — C50411 Malignant neoplasm of upper-outer quadrant of right female breast: Secondary | ICD-10-CM

## 2023-05-21 DIAGNOSIS — Z9013 Acquired absence of bilateral breasts and nipples: Secondary | ICD-10-CM | POA: Diagnosis not present

## 2023-05-21 DIAGNOSIS — Z9012 Acquired absence of left breast and nipple: Secondary | ICD-10-CM | POA: Diagnosis not present

## 2023-05-21 DIAGNOSIS — C50811 Malignant neoplasm of overlapping sites of right female breast: Secondary | ICD-10-CM | POA: Diagnosis present

## 2023-05-21 DIAGNOSIS — Z7189 Other specified counseling: Secondary | ICD-10-CM

## 2023-05-21 DIAGNOSIS — Z171 Estrogen receptor negative status [ER-]: Secondary | ICD-10-CM | POA: Diagnosis not present

## 2023-05-21 DIAGNOSIS — Z87891 Personal history of nicotine dependence: Secondary | ICD-10-CM | POA: Diagnosis not present

## 2023-05-21 LAB — COMPREHENSIVE METABOLIC PANEL
ALT: 67 U/L — ABNORMAL HIGH (ref 0–44)
AST: 44 U/L — ABNORMAL HIGH (ref 15–41)
Albumin: 3.9 g/dL (ref 3.5–5.0)
Alkaline Phosphatase: 46 U/L (ref 38–126)
Anion gap: 9 (ref 5–15)
BUN: 10 mg/dL (ref 6–20)
CO2: 25 mmol/L (ref 22–32)
Calcium: 9.4 mg/dL (ref 8.9–10.3)
Chloride: 105 mmol/L (ref 98–111)
Creatinine, Ser: 0.54 mg/dL (ref 0.44–1.00)
GFR, Estimated: >60 mL/min (ref >60)
Glucose, Bld: 100 mg/dL — ABNORMAL HIGH (ref 70–99)
Potassium: 4 mmol/L (ref 3.5–5.1)
Sodium: 139 mmol/L (ref 135–145)
Total Bilirubin: 0.6 mg/dL (ref <1.2)
Total Protein: 7 g/dL (ref 6.5–8.1)

## 2023-05-21 LAB — CBC WITH DIFFERENTIAL/PLATELET
Abs Immature Granulocytes: 0.01 10*3/uL (ref 0.00–0.07)
Basophils Absolute: 0 10*3/uL (ref 0.0–0.1)
Basophils Relative: 0 %
Eosinophils Absolute: 0.1 10*3/uL (ref 0.0–0.5)
Eosinophils Relative: 3 %
HCT: 34.5 % — ABNORMAL LOW (ref 36.0–46.0)
Hemoglobin: 11.6 g/dL — ABNORMAL LOW (ref 12.0–15.0)
Immature Granulocytes: 0 %
Lymphocytes Relative: 39 %
Lymphs Abs: 1.3 10*3/uL (ref 0.7–4.0)
MCH: 31 pg (ref 26.0–34.0)
MCHC: 33.6 g/dL (ref 30.0–36.0)
MCV: 92.2 fL (ref 80.0–100.0)
Monocytes Absolute: 0.3 10*3/uL (ref 0.1–1.0)
Monocytes Relative: 8 %
Neutro Abs: 1.7 10*3/uL (ref 1.7–7.7)
Neutrophils Relative %: 50 %
Platelets: 234 10*3/uL (ref 150–400)
RBC: 3.74 MIL/uL — ABNORMAL LOW (ref 3.87–5.11)
RDW: 11.7 % (ref 11.5–15.5)
WBC: 3.5 10*3/uL — ABNORMAL LOW (ref 4.0–10.5)
nRBC: 0 % (ref 0.0–0.2)

## 2023-05-21 NOTE — Research (Signed)
  Trial:  Study of Sacituzumab Govitecan-hziy and Pembrolizumab Versus Treatment ofPhysician's Choice in Patients With Triple Negative Breast Cancer Who Have Residual Invasive Disease After Surgery and Neoadjuvant Therapy  Patient Eileen Hart was identified by Dr. Smith Robert as a potential candidate for the above listed study.  This Clinical Research Nurse met with Eileen Hart, [id number], on 05/21/23 in a manner and location that ensures patient privacy to discuss participation in the above listed research study.  Patient is Accompanied by Her Mom .  A copy of the informed consent document with embedded HIPAA language was provided to the patient.  Patient reads, speaks, and understands [place].   Patient was provided with the business card of this Nurse and encouraged to contact the research team with any questions.  Approximately 20 minutes were spent with the patient reviewing the informed consent documents.  Patient was provided the option of taking informed consent documents home to review and was encouraged to review at their convenience with their support network, including other care providers. Patient took the consent documents home to review.  Research nurse will call patient on Tuesday, December 3rd to answer any questions she may have concerning the study and her decision to proceed or not with enrollment / eligibility.  Chriss Driver, RN 05/21/23 11:57 AM

## 2023-05-25 ENCOUNTER — Encounter: Payer: Self-pay | Admitting: Oncology

## 2023-05-25 NOTE — Progress Notes (Signed)
Hematology/Oncology Consult note [facility]  [phone] [phone]  Patient Care Team: Glori Luis, MD as PCP - General (Family Medicine) Hulen Luster, RN as Oncology Nurse Navigator Creig Hines, MD as Consulting Physician (Oncology) Leafy Ro, MD as Consulting Physician (General Surgery) Dillingham, Alena Bills, DO as Attending Physician (Plastic Surgery)   Name of the patient: Eileen Hart  [id number]  [birth year]   Date of visit: 05/25/23  Diagnosis- clinical prognostic stage Ib invasive mammary carcinoma of the right breast cT1 cN0 M0 triple negative   Chief complaint/ Reason for visit- discuss pathology results and further management  Heme/Onc history: Patient is a 32 year old female who underwent a bilateral diagnostic mammogram on 10/07/2022 after she felt a palpable area of concern in her right breast. Mammogram showed a 1.6 x 1 x 1.5 cm solid mass in the right breast 9:30 position 5 cm from the nipple. There was also an adjacent 6 x 9 x 2 mm mass in the right breast. No right axillary adenopathy. The dominant mass was biopsied and was consistent with invasive mammary carcinoma grade 3 ER and HER2 negative.    Bilateral breast MRI showed additional areas of concern in the right breast.  2 additional enhancing masses 1 in the 12 o'clock position and 1 in the 4 o'clock position.  2 adjacent prominent right axillary lymph nodes.  2 intermittent masses in the left breast 9 o'clock position and upper outer left breast.  This was followed by a dedicated ultrasound.  She had 3 breast biopsies in the right side and 1 left breast biopsy and all of them were negative for malignancy.  On the ultrasound her right axillary lymph nodes appeared normal and therefore biopsy was not recommended for the same.   PET CT scan showed focal hypermetabolic activity in the right breast adjacent to the biopsy clip but no other additional areas of  concern.  Baseline MUGA scan showed normal EF.   Given that additional biopsies were negative for malignancy and the only biopsy-proven site was 1.6 cm triple negative breast cancer with negative lymph nodes plan was to do [facility] Taxol chemotherapy neoadjuvant without opting for keynote 522 regimen.    Mild decrease in the size of primary breast mass after 4 cycles of AC chemotherapy. Carboplatin added to taxol neoadjuvantly    Interval history-patient is recovering well from her bilateral mastectomy.  Still has some soreness along the chest wall but is otherwise doing well  ECOG PS- 0 Pain scale- 0   Review of systems- Review of Systems  Constitutional:  Negative for chills, fever, malaise/fatigue and weight loss.  HENT:  Negative for congestion, ear discharge and nosebleeds.   Eyes:  Negative for blurred vision.  Respiratory:  Negative for cough, hemoptysis, sputum production, shortness of breath and wheezing.   Cardiovascular:  Negative for chest pain, palpitations, orthopnea and claudication.  Gastrointestinal:  Negative for abdominal pain, blood in stool, constipation, diarrhea, heartburn, melena, nausea and vomiting.  Genitourinary:  Negative for dysuria, flank pain, frequency, hematuria and urgency.  Musculoskeletal:  Negative for back pain, joint pain and myalgias.  Skin:  Negative for rash.  Neurological:  Negative for dizziness, tingling, focal weakness, seizures, weakness and headaches.  Endo/Heme/Allergies:  Does not bruise/bleed easily.  Psychiatric/Behavioral:  Negative for depression and suicidal ideas. The patient does not have insomnia.       No Known Allergies   Past Medical History:  Diagnosis Date  Chlamydia 01/25/2010   Depression with anxiety    Gestational hypertension    Immunization, viral disease    gardasil series completed   Invasive carcinoma of breast (HCC)    Tobacco abuse 05/02/2016     Past Surgical History:  Procedure Laterality Date    AUGMENTATION MAMMAPLASTY Bilateral    saline implants 2015   BREAST BIOPSY Right 10/14/2022   US biopsy/ coil clip/ path pending   BREAST BIOPSY Right 10/14/2022   [place] RT BREAST BX W LOC DEV 1ST LESION IMG BX SPEC US GUIDE 10/14/2022 [facility]   BREAST BIOPSY Left 10/29/2022   [place] Core Bx Ribbon clip- path pending   BREAST BIOPSY Right 10/29/2022   [place] Core 1130 Venus Clip- path pending   BREAST BIOPSY Right 10/29/2022   [place] Core Bx Retroareolar heart clip-path pending   BREAST BIOPSY Right 10/29/2022   [place] Core Bx Ribbon Clip path pending   BREAST BIOPSY Right 10/29/2022   [place] RT BREAST BX W LOC DEV EA ADD LESION IMG BX SPEC US GUIDE 10/29/2022 [facility]   BREAST BIOPSY Right 10/29/2022   [place] RT BREAST BX W LOC DEV 1ST LESION IMG BX SPEC US GUIDE 10/29/2022 [facility]   BREAST BIOPSY Right 10/29/2022   [place] RT BREAST BX W LOC DEV EA ADD LESION IMG BX SPEC US GUIDE 10/29/2022 [facility]   BREAST BIOPSY Left 10/29/2022   [place] LT BREAST BX W LOC DEV 1ST LESION IMG BX SPEC US GUIDE 10/29/2022 [facility]   BREAST ENHANCEMENT SURGERY  2015   BREAST RECONSTRUCTION WITH PLACEMENT OF TISSUE EXPANDER AND FLEX HD (ACELLULAR HYDRATED DERMIS) Bilateral 05/08/2023   Procedure: Bilateral immediate breast reconstruction with expanders and Flex HD placement;  Surgeon: Peggye Form, DO;  Location: [facility];  Service: Plastics;  Laterality: Bilateral;   CESAREAN SECTION  2014   MASTECTOMY W/ SENTINEL NODE BIOPSY Bilateral 05/08/2023   Procedure: MASTECTOMY Simple WITH SENTINEL LYMPH NODE BIOPSY, RNFA to assist;  Surgeon: Leafy Ro, MD;  Location: [facility];  Service: General;  Laterality: Bilateral;   PORTACATH PLACEMENT N/A 10/24/2022   Procedure: INSERTION PORT-A-CATH;  Surgeon: Leafy Ro, MD;  Location: [facility];  Service: General;  Laterality: N/A;   TONSILLECTOMY      Social History   Socioeconomic History   Marital status: Single    Spouse name: Not on file   Number of  children: 1   Years of education: 15   Highest education level: Not on file  Occupational History   Occupation: Groomer    Comment: [employer]  Tobacco Use   Smoking status: Former    Current packs/day: 0.00    Types: Cigarettes    Quit date: 07/25/2018    Years since quitting: 4.8    Passive exposure: Past   Smokeless tobacco: Never   Tobacco comments:    quit 07/2018  Vaping Use   Vaping status: Some Days  Substance and Sexual Activity   Alcohol use: No    Alcohol/week: 0.0 standard drinks of alcohol   Drug use: No   Sexual activity: Yes    Birth control/protection: Pill  Other Topics Concern   Not on file  Social History Narrative   Not on file   Social Determinants of Health   Financial Resource Strain: Not on file  Food Insecurity: No Food Insecurity (05/08/2023)   Hunger Vital Sign    Worried About Running Out of Food in the Last Year: Never true    Ran  Out of Food in the Last Year: Never true  Transportation Needs: No Transportation Needs (05/08/2023)   PRAPARE - Administrator, Civil Service (Medical): No    Lack of Transportation (Non-Medical): No  Physical Activity: Not on file  Stress: Not on file  Social Connections: Not on file  Intimate Partner Violence: Not At Risk (05/08/2023)   Humiliation, Afraid, Rape, and Kick questionnaire    Fear of Current or Ex-Partner: No    Emotionally Abused: No    Physically Abused: No    Sexually Abused: No    Family History  Problem Relation Age of Onset   Breast cancer Other 35       mat 2nd cousin   Diabetes Neg Hx    Heart disease Neg Hx    Hypertension Neg Hx    Ovarian cancer Neg Hx    Colon cancer Neg Hx      Current Outpatient Medications:    acetaminophen (TYLENOL) 500 MG tablet, Take 1,000 mg by mouth every 6 (six) hours as needed for moderate pain (pain score 4-6)., Disp: , Rfl:    diazepam (VALIUM) 2 MG tablet, Take 1 tablet (2 mg total) by mouth every 12 (twelve) hours as needed  for muscle spasms., Disp: 20 tablet, Rfl: 0   gabapentin (NEURONTIN) 300 MG capsule, Take 1 capsule (300 mg total) by mouth 3 (three) times daily., Disp: 90 capsule, Rfl: 3   ibuprofen (ADVIL) 200 MG tablet, Take 600-800 mg by mouth every 6 (six) hours as needed for moderate pain (pain score 4-6)., Disp: , Rfl:   Physical exam:  Vitals:   05/21/23 0937  BP: 121/87  Pulse: 97  Resp: 18  Temp: (!) 96.7 F (35.9 C)  TempSrc: Tympanic  SpO2: 100%  Weight: 120 lb (54.4 kg)   Physical Exam Cardiovascular:     Rate and Rhythm: Normal rate and regular rhythm.     Heart sounds: Normal heart sounds.  Pulmonary:     Effort: Pulmonary effort is normal.  Skin:    General: Skin is warm and dry.  Neurological:     Mental Status: She is alert and oriented to person, place, and time.   Chest wall exam: Patient is s/p bilateral mastectomy with immediate reconstruction.  Overall she seems to be healing well.  Bilateral chest wall drains in place     Latest Ref Rng & Units 05/21/2023    9:20 AM  CMP  Glucose 70 - 99 mg/dL 161   BUN 6 - 20 mg/dL 10   Creatinine 0.96 - 1.00 mg/dL 0.45   Sodium 409 - 811 mmol/L 139   Potassium 3.5 - 5.1 mmol/L 4.0   Chloride 98 - 111 mmol/L 105   CO2 22 - 32 mmol/L 25   Calcium 8.9 - 10.3 mg/dL 9.4   Total Protein 6.5 - 8.1 g/dL 7.0   Total Bilirubin <9.1 mg/dL 0.6   Alkaline Phos 38 - 126 U/L 46   AST 15 - 41 U/L 44   ALT 0 - 44 U/L 67       Latest Ref Rng & Units 05/21/2023    9:20 AM  CBC  WBC 4.0 - 10.5 K/uL 3.5   Hemoglobin 12.0 - 15.0 g/dL 47.8   Hematocrit 29.5 - 46.0 % 34.5   Platelets 150 - 400 K/uL 234     No images are attached to the encounter.  DG BREAST SURGICAL SPECIMEN NO CHARGE  Result Date: 05/09/2023 This procedure  is a no report and no charge.  It will auto finalize.  DG BREAST SURGICAL SPECIMEN NO CHARGE  Result Date: 05/09/2023 This procedure is a no report and no charge.  It will auto finalize.  DG BREAST SURGICAL  SPECIMEN NO CHARGE  Result Date: 05/09/2023 This procedure is a no report and no charge.  It will auto finalize.  NM Sentinel Node Inj-No Rpt (Breast)  Result Date: 05/08/2023 Sulfur Colloid was injected by the Nuclear Medicine Technologist for sentinel lymph node localization.     Assessment and plan- Patient is a 32 y.o. female with history of stage Ib invasive mammary carcinoma of the right breast cT1 cN0 M0 triple negative status post neoadjuvant AC CarboTaxol chemotherapyDiscuss final pathology results and further management  Prior to surgery patient was found to have 1.6 cm biopsy-proven triple negative breast cancer.  There were other enhancing masses noted on the MRI but none of them were biopsy-proven malignancy.  Lymph nodes were negative prior to surgery.  She underwent neoadjuvant chemotherapy with AC CarboTaxol.  She underwent bilateral mastectomy with immediate reconstruction.  No malignancy noted in the left breast.  She was found to have 0.6 cm invasive ductal carcinoma high-grade in the right breast.  There was another area of fibroma toyed change noted adjacent to it and the intervening parenchyma between these 2 areas showed an additional 0.4 cm invasive ductal carcinoma 2 sentinel lymph nodes negative for malignancy.  No definitive response to presurgical therapy in the invasive carcinoma.  Overall cancer cellularity 95%.  ypT1b N0.  Despite undergoing neoadjuvant AC CarboTaxol chemotherapy patient did not achieve a pathological complete response.  Neoadjuvant Keytruda was not incorporated in her treatment regimen as she did not have stage II or stage III triple negative breast cancer.  She will need adjuvant chemotherapy posttreatment given residual disease.  Outside of clinical trial I would recommend adjuvant Xeloda 2 weeks on and 1 week of for 8 cycles.  I will also reach out to radiation oncology if there would be any role for postmastectomy radiation to her chest wall given  residual disease despite neoadjuvant chemotherapy.  We will also screen her for the ASCENT 5 study which randomizes patients to Sacituzumab versus either Keytruda alone or Keytruda plus Xeloda.  This did include patients who had T1 disease and did not receive neoadjuvant Keytruda as well.  At this time I will give some time for patient to heal from her surgery before proceeding with adjuvant treatment.  In the meanwhile we will see if she would be eligible for the clinical trial.  I will tentatively see her on 06/10/2023 to discuss start of treatment.  Treatment will be given with a curative intent   Visit Diagnosis 1. Invasive carcinoma of breast (HCC)   2. Goals of care, counseling/discussion      Dr. Owens Shark, MD, MPH [facility] [phone] 05/25/2023 11:46 AM

## 2023-05-27 ENCOUNTER — Telehealth: Payer: Self-pay

## 2023-05-27 DIAGNOSIS — Z171 Estrogen receptor negative status [ER-]: Secondary | ICD-10-CM

## 2023-05-27 NOTE — Telephone Encounter (Signed)
Study of Sacituzumab Govitecan-hziy and Pembrolizumab Versus Treatment ofPhysician's Choice in Patients With Triple Negative Breast Cancer Who Have Residual Invasive Disease After Surgery and Neoadjuvant Therapy   Call to patient to ascertain interest in participating in the clinical trial. She states she is only concerned about long term effects of the IP. Research nurse will get the information for her to review. She is agreeable to participate in the study and wants to come in for consent visit. Patient request 05/28/2023 at 200 pm for a research consent visit. Patient was encouraged to bring her family with her to this visit if she wants to. Instructions give to ask for this nurse / research at the cancer center front desk for her visit, she would not need to register.  Chriss Driver, RN 05/27/23 4:06 PM

## 2023-05-28 ENCOUNTER — Inpatient Hospital Stay: Payer: Medicaid Other | Attending: Oncology

## 2023-05-28 ENCOUNTER — Ambulatory Visit: Payer: Medicaid Other | Admitting: Surgical

## 2023-05-28 ENCOUNTER — Telehealth: Payer: Self-pay | Admitting: Pharmacist

## 2023-05-28 DIAGNOSIS — C50919 Malignant neoplasm of unspecified site of unspecified female breast: Secondary | ICD-10-CM

## 2023-05-28 DIAGNOSIS — Z9889 Other specified postprocedural states: Secondary | ICD-10-CM

## 2023-05-28 DIAGNOSIS — Z9221 Personal history of antineoplastic chemotherapy: Secondary | ICD-10-CM | POA: Insufficient documentation

## 2023-05-28 DIAGNOSIS — Z171 Estrogen receptor negative status [ER-]: Secondary | ICD-10-CM

## 2023-05-28 DIAGNOSIS — C50811 Malignant neoplasm of overlapping sites of right female breast: Secondary | ICD-10-CM | POA: Insufficient documentation

## 2023-05-28 DIAGNOSIS — Z79899 Other long term (current) drug therapy: Secondary | ICD-10-CM | POA: Insufficient documentation

## 2023-05-28 DIAGNOSIS — N6310 Unspecified lump in the right breast, unspecified quadrant: Secondary | ICD-10-CM

## 2023-05-28 DIAGNOSIS — Z87891 Personal history of nicotine dependence: Secondary | ICD-10-CM | POA: Insufficient documentation

## 2023-05-28 DIAGNOSIS — Z9013 Acquired absence of bilateral breasts and nipples: Secondary | ICD-10-CM | POA: Insufficient documentation

## 2023-05-28 DIAGNOSIS — Z1379 Encounter for other screening for genetic and chromosomal anomalies: Secondary | ICD-10-CM

## 2023-05-28 MED ORDER — DIAZEPAM 2 MG PO TABS: 2.0000 mg | ORAL_TABLET | Freq: Two times a day (BID) | ORAL | 0 refills | Status: DC | PRN

## 2023-05-28 NOTE — Telephone Encounter (Signed)
Clinical Pharmacist Practitioner Encounter   Received new prescription for Xeloda (capecitabine) for the adjuvant treatment of early stage triple negative breast cancer, planned duration of 8 cycles.  CMP from 05/21/23 assessed, no relevant lab abnormalities. Prescription dose and frequency assessed.   Current medication list in Epic reviewed, no DDIs with capecitabine identified.   Evaluated chart and no patient barriers to medication adherence identified.   Prescription has been e-scribed to the [facility] for benefits analysis and approval.  Oral Oncology Clinic will continue to follow for insurance authorization, copayment issues, initial counseling and start date.   Remi Haggard, PharmD, BCPS, BCOP, CPP Hematology/Oncology Clinical Pharmacist Practitioner [facility] [phone]  05/28/2023 4:01 PM

## 2023-05-28 NOTE — Progress Notes (Signed)
Patient is a 32 year old female here for follow-up after immediate bilateral breast reconstruction placement tissue expanders and Flex HD with Dr. Ulice Bold after bilateral simple mastectomy with Dr. Everlene Farrier on 05/08/2023.  Of note she had a history of implants prior to surgery.  Implants were placed submuscular.  Patient reports she is doing really well.  She still has her drains in place.  She reports the output has been very minimal.  She is not having any infectious symptoms.  She would like it filled today.  Chaperone present on exam On exam bilateral breast incisions are intact, bilateral NAC's are viable.  No subcutaneous fluid collections noted.  Bilateral JP drains in place with serous drainage in each bulb.   No erythema or cellulitic changes noted.  A/P:  Bilateral JP drains were removed, patient tolerated this well.  Recommend keeping these covered with gauze and tape until completely healed, discussed this could take anywhere from 3 to 7 days.  Recommend following up in 1 week for reevaluation and additional fill.  Recommend calling with questions or concerns.  We placed injectable saline in the Expander using a sterile technique: Right: 50 cc for a total of 250 / 455 cc Left: 50 cc for a total of 250 / 455 cc  There is no signs of infection or concern on exam.

## 2023-05-28 NOTE — Telephone Encounter (Signed)
Spoke with patient this afternoon to let her know she is not eligible for the protocol as her tumor size was not large enough. Patient was grateful to hear that. Patient was thanked for her interest in clinical trials and encouraged to call with any concerns or interest in the future.  Chriss Driver, RN 05/28/23 2:30 PM

## 2023-06-02 ENCOUNTER — Telehealth: Payer: Self-pay

## 2023-06-02 ENCOUNTER — Other Ambulatory Visit: Payer: Self-pay

## 2023-06-02 ENCOUNTER — Encounter: Payer: Self-pay | Admitting: Oncology

## 2023-06-02 ENCOUNTER — Other Ambulatory Visit (HOSPITAL_COMMUNITY): Payer: Self-pay

## 2023-06-02 DIAGNOSIS — Z171 Estrogen receptor negative status [ER-]: Secondary | ICD-10-CM

## 2023-06-02 MED ORDER — DICLOFENAC SODIUM 1 % EX GEL
CUTANEOUS | 0 refills | Status: DC
  Filled 2023-06-02: qty 400, 90d supply, fill #0
  Filled 2023-06-03: qty 400, 84d supply, fill #0

## 2023-06-02 MED ORDER — CAPECITABINE 500 MG PO TABS
1500.0000 mg | ORAL_TABLET | Freq: Two times a day (BID) | ORAL | 1 refills | Status: DC
  Filled 2023-06-02: qty 84, 21d supply, fill #0
  Filled 2023-06-19: qty 84, 21d supply, fill #1

## 2023-06-02 NOTE — Telephone Encounter (Signed)
Oral Oncology Patient Advocate Encounter  After completing a benefits investigation, prior authorization for Capecitabine is not required at this time through [facility] [place].  Patient's copay is $0.00.     Ardeen Fillers, CPhT Oncology Pharmacy Patient Advocate  [facility]  [phone] (phone) [phone] (fax) 06/02/2023 2:43 PM

## 2023-06-02 NOTE — Telephone Encounter (Signed)
Patient offered referral to diclofenac prophylaxis hand-foot syndrome research project. Patient agreed to referral.

## 2023-06-02 NOTE — Progress Notes (Signed)
Research Note - Consent Authorization   Study Name: Evaluation of Topical Diclofenac Use on the Incidence and Severity of Hand Foot Syndrome in Patients with Breast and Gastrointestinal Malignancies Taking Capecitabine   IRB# [id number]   A summary of the study was presented to the patient, including potential risks and benefits from study participation, alternatives treatment options available other than study participation, how their confidentiality will be maintained, and who to contact if they had questions regarding their rights as a study participant or if they experience an injury associated with their participation. They were told that participation is completely voluntary and choosing not to participate would not impact care they would otherwise have access to. They were also told that even if they choose to participate, they can withdraw their consent to participate at any time in the future. Any questions the patient asked were addressed by a member of the research team.   After having all of their questions answered, the patient agreed to participate in the study by confirming their willingness to consent to participation verbally over the phone on 06/02/2023 at 4:09 PM.    Jerry Caras, PharmD PGY2 Oncology Pharmacy Resident  Principal Investigator  [phone]

## 2023-06-02 NOTE — Progress Notes (Signed)
Specialty Pharmacy Initiation Note   Eileen Hart is a 32 y.o. female who will be followed by the specialty pharmacy service for RxSp Oncology    Review of administration, indication, effectiveness, safety, potential side effects, storage/disposable, and missed dose instructions occurred today for patient's specialty medication(s) Capecitabine   Planned 8 cycles of capecitabine.   Patient/Caregiver did not have any additional questions or concerns.   Patient's therapy is appropriate to: Initiate    Goals Addressed             This Visit's Progress    Achieve a cure       Patient is initiating therapy. Patient will maintain adherence         Remi Haggard Specialty Pharmacist

## 2023-06-02 NOTE — Progress Notes (Signed)
Specialty Pharmacy Initial Fill Coordination Note  Eileen Hart is a 32 y.o. female contacted today regarding initial fill of specialty medication(s) Capecitabine   Patient requested Delivery   Delivery date: 06/04/23   Verified address: [address], [place], [place] [address]  Medication will be filled on 06/03/23.   Patient is aware of $0.00 copayment.   Ardeen Fillers, CPhT Oncology Pharmacy Patient Advocate  [facility]  [phone] (phone) [phone] (fax) 06/02/2023 3:16 PM

## 2023-06-03 ENCOUNTER — Other Ambulatory Visit (HOSPITAL_COMMUNITY): Payer: Self-pay

## 2023-06-03 ENCOUNTER — Encounter: Payer: Self-pay | Admitting: Oncology

## 2023-06-03 ENCOUNTER — Other Ambulatory Visit: Payer: Self-pay

## 2023-06-04 ENCOUNTER — Ambulatory Visit: Payer: Medicaid Other | Admitting: Surgical

## 2023-06-04 VITALS — BP 114/80 | HR 97

## 2023-06-04 DIAGNOSIS — Z171 Estrogen receptor negative status [ER-]: Secondary | ICD-10-CM

## 2023-06-04 DIAGNOSIS — C50411 Malignant neoplasm of upper-outer quadrant of right female breast: Secondary | ICD-10-CM

## 2023-06-04 NOTE — Progress Notes (Signed)
Patient is a 32 year old female here for follow-up after immediate bilateral breast reconstruction placement tissue expanders and Flex HD with Dr. Ulice Bold on 05/08/2023  She had a history of implants prior to surgery which were submuscular.  Expanders were placed in a submuscular plane.  Bilateral JP drains were removed at her last appointment. She is doing really well, she is not having any specific issues or concerns today. Pain is well-controlled.  She is not having any infectious symptoms.  Chaperone present on exam  Bilateral NAC's are viable, bilateral breast incisions are intact and appear to be healing very well.  There is no erythema or cellulitic changes noted of either breast.  Steri-Strips are still in place.  Bilateral JP drain insertion site wounds are beginning to heal well and have some scabbing still present.  Bilateral breasts are symmetric, no subcutaneous fluid collection noted.   A/P:  Patient is doing well, no signs of infection or concern.  Recommend following up in 2 weeks for additional expansion. We placed injectable saline in the Expander using a sterile technique: Right: 50 cc for a total of 300 / 455 cc Left: 50 cc for a total of 300 / 455 cc

## 2023-06-04 NOTE — Telephone Encounter (Signed)
Patient successfully OnBoarded and drug education provided by pharmacist. Medication scheduled to be shipped on Tuesday, 06/03/23, for delivery on Wednesday, 06/04/23, from [facility] to patient's address. Patient also knows to call me at [phone] with any questions or concerns regarding receiving medication or if there is any unexpected change in co-pay.    Ardeen Fillers, CPhT Oncology Pharmacy Patient Advocate  [facility]  [phone] (phone) [phone] (fax) 06/04/2023 9:21 AM

## 2023-06-10 ENCOUNTER — Inpatient Hospital Stay: Payer: Medicaid Other

## 2023-06-10 ENCOUNTER — Inpatient Hospital Stay (HOSPITAL_BASED_OUTPATIENT_CLINIC_OR_DEPARTMENT_OTHER): Payer: Medicaid Other | Admitting: Oncology

## 2023-06-10 ENCOUNTER — Inpatient Hospital Stay: Payer: Medicaid Other | Admitting: Pharmacist

## 2023-06-10 ENCOUNTER — Encounter: Payer: Self-pay | Admitting: Oncology

## 2023-06-10 VITALS — BP 107/78 | HR 87 | Temp 98.5°F | Resp 17 | Wt 116.0 lb

## 2023-06-10 DIAGNOSIS — C50411 Malignant neoplasm of upper-outer quadrant of right female breast: Secondary | ICD-10-CM

## 2023-06-10 DIAGNOSIS — Z79899 Other long term (current) drug therapy: Secondary | ICD-10-CM | POA: Diagnosis not present

## 2023-06-10 DIAGNOSIS — Z7189 Other specified counseling: Secondary | ICD-10-CM

## 2023-06-10 DIAGNOSIS — Z9221 Personal history of antineoplastic chemotherapy: Secondary | ICD-10-CM | POA: Diagnosis not present

## 2023-06-10 DIAGNOSIS — Z171 Estrogen receptor negative status [ER-]: Secondary | ICD-10-CM

## 2023-06-10 DIAGNOSIS — C50811 Malignant neoplasm of overlapping sites of right female breast: Secondary | ICD-10-CM | POA: Diagnosis present

## 2023-06-10 DIAGNOSIS — Z87891 Personal history of nicotine dependence: Secondary | ICD-10-CM | POA: Diagnosis not present

## 2023-06-10 DIAGNOSIS — Z9013 Acquired absence of bilateral breasts and nipples: Secondary | ICD-10-CM | POA: Diagnosis not present

## 2023-06-10 LAB — CBC WITH DIFFERENTIAL (CANCER CENTER ONLY)
Abs Immature Granulocytes: 0.01 10*3/uL (ref 0.00–0.07)
Basophils Absolute: 0 10*3/uL (ref 0.0–0.1)
Basophils Relative: 1 %
Eosinophils Absolute: 0.1 10*3/uL (ref 0.0–0.5)
Eosinophils Relative: 2 %
HCT: 38.8 % (ref 36.0–46.0)
Hemoglobin: 12.9 g/dL (ref 12.0–15.0)
Immature Granulocytes: 0 %
Lymphocytes Relative: 36 %
Lymphs Abs: 1.4 10*3/uL (ref 0.7–4.0)
MCH: 29.7 pg (ref 26.0–34.0)
MCHC: 33.2 g/dL (ref 30.0–36.0)
MCV: 89.4 fL (ref 80.0–100.0)
Monocytes Absolute: 0.4 10*3/uL (ref 0.1–1.0)
Monocytes Relative: 9 %
Neutro Abs: 1.9 10*3/uL (ref 1.7–7.7)
Neutrophils Relative %: 52 %
Platelet Count: 168 10*3/uL (ref 150–400)
RBC: 4.34 MIL/uL (ref 3.87–5.11)
RDW: 11.4 % — ABNORMAL LOW (ref 11.5–15.5)
WBC Count: 3.8 10*3/uL — ABNORMAL LOW (ref 4.0–10.5)
nRBC: 0 % (ref 0.0–0.2)

## 2023-06-10 LAB — CMP (CANCER CENTER ONLY)
ALT: 12 U/L (ref 0–44)
AST: 15 U/L (ref 15–41)
Albumin: 4.7 g/dL (ref 3.5–5.0)
Alkaline Phosphatase: 44 U/L (ref 38–126)
Anion gap: 6 (ref 5–15)
BUN: 13 mg/dL (ref 6–20)
CO2: 27 mmol/L (ref 22–32)
Calcium: 10 mg/dL (ref 8.9–10.3)
Chloride: 106 mmol/L (ref 98–111)
Creatinine: 0.78 mg/dL (ref 0.44–1.00)
GFR, Estimated: >60 mL/min (ref >60)
Glucose, Bld: 95 mg/dL (ref 70–99)
Potassium: 5 mmol/L (ref 3.5–5.1)
Sodium: 139 mmol/L (ref 135–145)
Total Bilirubin: 0.6 mg/dL (ref <1.2)
Total Protein: 7.6 g/dL (ref 6.5–8.1)

## 2023-06-10 NOTE — Progress Notes (Signed)
Hematology/Oncology Consult note [facility]  [phone] [phone]  Patient Care Team: Glori Luis, MD as PCP - General (Family Medicine) Hulen Luster, RN as Oncology Nurse Navigator Creig Hines, MD as Consulting Physician (Oncology) Leafy Ro, MD as Consulting Physician (General Surgery) Dillingham, Alena Bills, DO as Attending Physician (Plastic Surgery)   Name of the patient: Eileen Hart  [id number]  February 08, 1991   Date of visit: 06/10/23  Diagnosis-  clinical prognostic stage Ib invasive mammary carcinoma of the right breast cT1 cN0 M0 triple negative   Chief complaint/ Reason for visit-discuss further management of breast cancer  Heme/Onc history: Patient is a 32 year old female who underwent a bilateral diagnostic mammogram on 10/07/2022 after she felt a palpable area of concern in her right breast. Mammogram showed a 1.6 x 1 x 1.5 cm solid mass in the right breast 9:30 position 5 cm from the nipple. There was also an adjacent 6 x 9 x 2 mm mass in the right breast. No right axillary adenopathy. The dominant mass was biopsied and was consistent with invasive mammary carcinoma grade 3 ER and HER2 negative.    Bilateral breast MRI showed additional areas of concern in the right breast.  2 additional enhancing masses 1 in the 12 o'clock position and 1 in the 4 o'clock position.  2 adjacent prominent right axillary lymph nodes.  2 intermittent masses in the left breast 9 o'clock position and upper outer left breast.  This was followed by a dedicated ultrasound.  She had 3 breast biopsies in the right side and 1 left breast biopsy and all of them were negative for malignancy.  On the ultrasound her right axillary lymph nodes appeared normal and therefore biopsy was not recommended for the same.   PET CT scan showed focal hypermetabolic activity in the right breast adjacent to the biopsy clip but no other additional areas of concern.   Baseline MUGA scan showed normal EF.   Given that additional biopsies were negative for malignancy and the only biopsy-proven site was 1.6 cm triple negative breast cancer with negative lymph nodes plan was to do [facility] Taxol chemotherapy neoadjuvant without opting for keynote 522 regimen.    Mild decrease in the size of primary breast mass after 4 cycles of AC chemotherapy. Carboplatin added to taxol neoadjuvantly  Patient underwent bilateral mastectomy with reconstruction on 05/08/2023.No malignancy noted in the left breast. She was found to have 0.6 cm invasive ductal carcinoma high-grade in the right breast. There was another area of fibromatoid change noted adjacent to it and the intervening parenchyma between these 2 areas showed an additional 0.4 cm invasive ductal carcinoma 2 sentinel lymph nodes negative for malignancy. No definitive response to presurgical therapy in the invasive carcinoma. Overall cancer cellularity 95%. ypT1b N0.   Interval history-she is doing well overall and denies any specific complaints at this time  ECOG PS- 0 Pain scale- 0   Review of systems- Review of Systems  Constitutional:  Negative for chills, fever, malaise/fatigue and weight loss.  HENT:  Negative for congestion, ear discharge and nosebleeds.   Eyes:  Negative for blurred vision.  Respiratory:  Negative for cough, hemoptysis, sputum production, shortness of breath and wheezing.   Cardiovascular:  Negative for chest pain, palpitations, orthopnea and claudication.  Gastrointestinal:  Negative for abdominal pain, blood in stool, constipation, diarrhea, heartburn, melena, nausea and vomiting.  Genitourinary:  Negative for dysuria, flank pain, frequency, hematuria and urgency.  Musculoskeletal:  Negative for back pain, joint pain and myalgias.  Skin:  Negative for rash.  Neurological:  Negative for dizziness, tingling, focal weakness, seizures, weakness and headaches.  Endo/Heme/Allergies:  Does not  bruise/bleed easily.  Psychiatric/Behavioral:  Negative for depression and suicidal ideas. The patient does not have insomnia.       No Known Allergies   Past Medical History:  Diagnosis Date   Chlamydia 01/25/2010   Depression with anxiety    Gestational hypertension    Immunization, viral disease    gardasil series completed   Invasive carcinoma of breast (HCC)    Tobacco abuse 05/02/2016     Past Surgical History:  Procedure Laterality Date   AUGMENTATION MAMMAPLASTY Bilateral    saline implants 2015   BREAST BIOPSY Right 10/14/2022   US biopsy/ coil clip/ path pending   BREAST BIOPSY Right 10/14/2022   [place] RT BREAST BX W LOC DEV 1ST LESION IMG BX SPEC US GUIDE 10/14/2022 [facility]   BREAST BIOPSY Left 10/29/2022   [place] Core Bx Ribbon clip- path pending   BREAST BIOPSY Right 10/29/2022   [place] Core 1130 Venus Clip- path pending   BREAST BIOPSY Right 10/29/2022   [place] Core Bx Retroareolar heart clip-path pending   BREAST BIOPSY Right 10/29/2022   [place] Core Bx Ribbon Clip path pending   BREAST BIOPSY Right 10/29/2022   [place] RT BREAST BX W LOC DEV EA ADD LESION IMG BX SPEC US GUIDE 10/29/2022 [facility]   BREAST BIOPSY Right 10/29/2022   [place] RT BREAST BX W LOC DEV 1ST LESION IMG BX SPEC US GUIDE 10/29/2022 [facility]   BREAST BIOPSY Right 10/29/2022   [place] RT BREAST BX W LOC DEV EA ADD LESION IMG BX SPEC US GUIDE 10/29/2022 [facility]   BREAST BIOPSY Left 10/29/2022   [place] LT BREAST BX W LOC DEV 1ST LESION IMG BX SPEC US GUIDE 10/29/2022 [facility]   BREAST ENHANCEMENT SURGERY  2015   BREAST RECONSTRUCTION WITH PLACEMENT OF TISSUE EXPANDER AND FLEX HD (ACELLULAR HYDRATED DERMIS) Bilateral 05/08/2023   Procedure: Bilateral immediate breast reconstruction with expanders and Flex HD placement;  Surgeon: Peggye Form, DO;  Location: [facility];  Service: Plastics;  Laterality: Bilateral;   CESAREAN SECTION  2014   MASTECTOMY W/ SENTINEL NODE BIOPSY Bilateral 05/08/2023    Procedure: MASTECTOMY Simple WITH SENTINEL LYMPH NODE BIOPSY, RNFA to assist;  Surgeon: Leafy Ro, MD;  Location: [facility];  Service: General;  Laterality: Bilateral;   PORTACATH PLACEMENT N/A 10/24/2022   Procedure: INSERTION PORT-A-CATH;  Surgeon: Leafy Ro, MD;  Location: [facility];  Service: General;  Laterality: N/A;   TONSILLECTOMY      Social History   Socioeconomic History   Marital status: Single    Spouse name: Not on file   Number of children: 1   Years of education: 15   Highest education level: Not on file  Occupational History   Occupation: Groomer    Comment: [employer]  Tobacco Use   Smoking status: Former    Current packs/day: 0.00    Types: Cigarettes    Quit date: 07/25/2018    Years since quitting: 4.8    Passive exposure: Past   Smokeless tobacco: Never   Tobacco comments:    quit 07/2018  Vaping Use   Vaping status: Some Days  Substance and Sexual Activity   Alcohol use: No    Alcohol/week: 0.0 standard drinks of alcohol   Drug use: No   Sexual activity:  Yes    Birth control/protection: Pill  Other Topics Concern   Not on file  Social History Narrative   Not on file   Social Drivers of Health   Financial Resource Strain: Not on file  Food Insecurity: No Food Insecurity (05/08/2023)   Hunger Vital Sign    Worried About Running Out of Food in the Last Year: Never true    Ran Out of Food in the Last Year: Never true  Transportation Needs: No Transportation Needs (05/08/2023)   PRAPARE - Administrator, Civil Service (Medical): No    Lack of Transportation (Non-Medical): No  Physical Activity: Not on file  Stress: Not on file  Social Connections: Not on file  Intimate Partner Violence: Not At Risk (05/08/2023)   Humiliation, Afraid, Rape, and Kick questionnaire    Fear of Current or Ex-Partner: No    Emotionally Abused: No    Physically Abused: No    Sexually Abused: No    Family History  Problem Relation Age  of Onset   Breast cancer Other 35       mat 2nd cousin   Diabetes Neg Hx    Heart disease Neg Hx    Hypertension Neg Hx    Ovarian cancer Neg Hx    Colon cancer Neg Hx      Current Outpatient Medications:    acetaminophen (TYLENOL) 500 MG tablet, Take 1,000 mg by mouth every 6 (six) hours as needed for moderate pain (pain score 4-6)., Disp: , Rfl:    capecitabine (XELODA) 500 MG tablet, Take 3 tablets (1,500 mg total) by mouth 2 (two) times daily after a meal. Take for 14 days, then hold for 7 days. Repeat every 21 days., Disp: 84 tablet, Rfl: 1   diazepam (VALIUM) 2 MG tablet, Take 1 tablet (2 mg total) by mouth every 12 (twelve) hours as needed for muscle spasms., Disp: 20 tablet, Rfl: 0   diazepam (VALIUM) 2 MG tablet, Take 1 tablet (2 mg total) by mouth every 12 (twelve) hours as needed for muscle spasms., Disp: 20 tablet, Rfl: 0   diclofenac Sodium (VOLTAREN) 1 % GEL, Research Patient: Apply 0.5 grams (1 fingertip) to each hand and each foot twice daily for up to 12 weeks, Disp: 400 g, Rfl: 0   gabapentin (NEURONTIN) 300 MG capsule, Take 1 capsule (300 mg total) by mouth 3 (three) times daily., Disp: 90 capsule, Rfl: 3   ibuprofen (ADVIL) 200 MG tablet, Take 600-800 mg by mouth every 6 (six) hours as needed for moderate pain (pain score 4-6)., Disp: , Rfl:   Physical exam:  Vitals:   06/10/23 0949  BP: 107/78  Pulse: 87  Resp: 17  Temp: 98.5 F (36.9 C)  TempSrc: Tympanic  SpO2: 100%  Weight: 116 lb (52.6 kg)   Physical Exam Cardiovascular:     Rate and Rhythm: Normal rate and regular rhythm.     Heart sounds: Normal heart sounds.  Pulmonary:     Effort: Pulmonary effort is normal.  Skin:    General: Skin is warm and dry.  Neurological:     Mental Status: She is alert and oriented to person, place, and time.         Latest Ref Rng & Units 06/10/2023   10:23 AM  CMP  Glucose 70 - 99 mg/dL 95   BUN 6 - 20 mg/dL 13   Creatinine 8.65 - 1.00 mg/dL 7.84   Sodium 696  - 295 mmol/L 139  Potassium 3.5 - 5.1 mmol/L 5.0   Chloride 98 - 111 mmol/L 106   CO2 22 - 32 mmol/L 27   Calcium 8.9 - 10.3 mg/dL 62.1   Total Protein 6.5 - 8.1 g/dL 7.6   Total Bilirubin <3.0 mg/dL 0.6   Alkaline Phos 38 - 126 U/L 44   AST 15 - 41 U/L 15   ALT 0 - 44 U/L 12       Latest Ref Rng & Units 06/10/2023   10:23 AM  CBC  WBC 4.0 - 10.5 K/uL 3.8   Hemoglobin 12.0 - 15.0 g/dL 86.5   Hematocrit 78.4 - 46.0 % 38.8   Platelets 150 - 400 K/uL 168     No images are attached to the encounter.  No results found.   Assessment and plan- Patient is a 32 y.o. female with history of stage Ib invasive mammary carcinoma of the right breast cT1 cN0 M0 triple negative status post neoadjuvant AC CarboTaxol chemotherapy.  She had 2 foci of residual disease 0.6 and 0.4 cm after neoadjuvant chemotherapy.  She is here to discuss further management  Patient did not qualify for ASCENT 5 study as she was node-negative and T1c.  I therefore recommend adjuvant Xeloda for her at 1000 mg/m 2 weeks on and 1 week off.  She has already received the shipment and will start taking the medication tomorrow.  This will be done for 8 cycles or 6 months.  Discussed risks and benefits of Xeloda including all but not limited to nausea vomiting low blood counts, skin rash and diarrhea.  Treatment will be given with a curative intent.  Patient understands and agrees to proceed as planned.  I will see her back in 4 weeks with labs  I also discussed pros and cons of CT DNA testing and I do not recommend that for her for future surveillance.  We could consider getting surveillance imaging sometime around April or May of next year.   Visit Diagnosis 1. Malignant neoplasm of upper-outer quadrant of right breast in female, estrogen receptor negative (HCC)   2. Goals of care, counseling/discussion   3. High risk medication use      Dr. Owens Shark, MD, MPH [facility] [phone] 06/10/2023 12:59 PM

## 2023-06-10 NOTE — Progress Notes (Signed)
Clinical Pharmacist Practitioner Clinic [facility]  [phone] [phone]  Patient Care Team: Glori Luis, MD as PCP - General (Family Medicine) Hulen Luster, RN as Oncology Nurse Navigator Creig Hines, MD as Consulting Physician (Oncology) Leafy Ro, MD as Consulting Physician (General Surgery) Dillingham, Alena Bills, DO as Attending Physician (Plastic Surgery)   Name of the patient: Eileen Hart  [id number]  November 30, 1990   Date of visit: 06/10/23  HPI: Patient is a 32 y.o. female with early stage triple negative breast cancer. Planned adjuvant treatment with Xeloda (capecitabine) for 8 cycles to start on 06/11/23.  Reason for Consult: Capecitabine oral chemotherapy education.   PAST MEDICAL HISTORY: Past Medical History:  Diagnosis Date   Chlamydia 01/25/2010   Depression with anxiety    Gestational hypertension    Immunization, viral disease    gardasil series completed   Invasive carcinoma of breast (HCC)    Tobacco abuse 05/02/2016    HEMATOLOGY/ONCOLOGY HISTORY:  Oncology History  Malignant neoplasm of upper-outer quadrant of right breast in female, estrogen receptor negative (HCC)  10/22/2022 Initial Diagnosis   Malignant neoplasm of upper-outer quadrant of right breast in female, estrogen receptor negative (HCC)   10/22/2022 Cancer Staging   Staging form: Breast, AJCC 8th Edition - Clinical stage from 10/22/2022: Stage IB (cT1c, cN0, cM0, G3, ER-, PR-, HER2-) - Signed by Creig Hines, MD on 10/22/2022 Stage prefix: Initial diagnosis Histologic grading system: 3 grade system    Genetic Testing   Negative genetic testing. No pathogenic variants identified on the [facility] Multi-Cancer+RNA panel. The report date is 10/28/2022.  The Multi-Cancer + RNA Panel offered by [facility] includes sequencing and/or deletion/duplication analysis of the following 70 genes:  AIP*, ALK, APC*, ATM*, AXIN2*, BAP1*, BARD1*, BLM*, BMPR1A*,  BRCA1*, BRCA2*, BRIP1*, CDC73*, CDH1*, CDK4, CDKN1B*, CDKN2A, CHEK2*, CTNNA1*, DICER1*, EPCAM, EGFR, FH*, FLCN*, GREM1, HOXB13, KIT, LZTR1, MAX*, MBD4, MEN1*, MET, MITF, MLH1*, MSH2*, MSH3*, MSH6*, MUTYH*, NF1*, NF2*, NTHL1*, PALB2*, PDGFRA, PMS2*, POLD1*, POLE*, POT1*, PRKAR1A*, PTCH1*, PTEN*, RAD51C*, RAD51D*, RB1*, RET, SDHA*, SDHAF2*, SDHB*, SDHC*, SDHD*, SMAD4*, SMARCA4*, SMARCB1*, SMARCE1*, STK11*, SUFU*, TMEM127*, TP53*, TSC1*, TSC2*, VHL*. RNA analysis is performed for * genes.   11/08/2022 -  Chemotherapy   Patient is on Treatment Plan : BREAST NEOADJUVANT DOSE DENSE AC q14d / carboplatin- PACLitaxel q7d       ALLERGIES:  has no known allergies.  MEDICATIONS:  Current Outpatient Medications  Medication Sig Dispense Refill   acetaminophen (TYLENOL) 500 MG tablet Take 1,000 mg by mouth every 6 (six) hours as needed for moderate pain (pain score 4-6).     capecitabine (XELODA) 500 MG tablet Take 3 tablets (1,500 mg total) by mouth 2 (two) times daily after a meal. Take for 14 days, then hold for 7 days. Repeat every 21 days. 84 tablet 1   diazepam (VALIUM) 2 MG tablet Take 1 tablet (2 mg total) by mouth every 12 (twelve) hours as needed for muscle spasms. 20 tablet 0   diazepam (VALIUM) 2 MG tablet Take 1 tablet (2 mg total) by mouth every 12 (twelve) hours as needed for muscle spasms. 20 tablet 0   diclofenac Sodium (VOLTAREN) 1 % GEL Research Patient: Apply 0.5 grams (1 fingertip) to each hand and each foot twice daily for up to 12 weeks 400 g 0   gabapentin (NEURONTIN) 300 MG capsule Take 1 capsule (300 mg total) by mouth 3 (three) times daily. 90 capsule 3   ibuprofen (ADVIL) 200 MG  tablet Take 600-800 mg by mouth every 6 (six) hours as needed for moderate pain (pain score 4-6).     No current facility-administered medications for this visit.    VITAL SIGNS: There were no vitals taken for this visit. There were no vitals filed for this visit.  Estimated body mass index is 19.91  kg/m as calculated from the following:   Height as of 05/19/23: 5\' 4"  (1.626 m).   Weight as of an earlier encounter on 06/10/23: 52.6 kg (116 lb).  LABS: CBC:    Component Value Date/Time   WBC 3.8 (L) 06/10/2023 1023   WBC 3.5 (L) 05/21/2023 0920   HGB 12.9 06/10/2023 1023   HGB 9.5 (L) 03/15/2013 1102   HCT 38.8 06/10/2023 1023   HCT 28.2 (L) 03/15/2013 1102   PLT 168 06/10/2023 1023   PLT 257 03/15/2013 1102   MCV 89.4 06/10/2023 1023   MCV 81 03/15/2013 1102   NEUTROABS 1.9 06/10/2023 1023   NEUTROABS 12.9 (H) 03/15/2013 1102   LYMPHSABS 1.4 06/10/2023 1023   LYMPHSABS 2.9 03/15/2013 1102   MONOABS 0.4 06/10/2023 1023   MONOABS 1.3 (H) 03/15/2013 1102   EOSABS 0.1 06/10/2023 1023   EOSABS 0.6 03/15/2013 1102   BASOSABS 0.0 06/10/2023 1023   BASOSABS 0.0 03/15/2013 1102   Comprehensive Metabolic Panel:    Component Value Date/Time   NA 139 05/21/2023 0920   NA 139 03/11/2013 0517   K 4.0 05/21/2023 0920   K 3.2 (L) 03/11/2013 0517   CL 105 05/21/2023 0920   CL 106 03/11/2013 0517   CO2 25 05/21/2023 0920   CO2 26 03/11/2013 0517   BUN 10 05/21/2023 0920   BUN 4 (L) 03/11/2013 0517   CREATININE 0.54 05/21/2023 0920   CREATININE 0.60 03/31/2023 0838   CREATININE 0.66 03/11/2013 0517   GLUCOSE 100 (H) 05/21/2023 0920   GLUCOSE 72 03/11/2013 0517   CALCIUM 9.4 05/21/2023 0920   CALCIUM 8.5 03/11/2013 0517   AST 44 (H) 05/21/2023 0920   AST 27 03/31/2023 0838   ALT 67 (H) 05/21/2023 0920   ALT 36 03/31/2023 0838   ALKPHOS 46 05/21/2023 0920   BILITOT 0.6 05/21/2023 0920   BILITOT 0.3 03/31/2023 0838   PROT 7.0 05/21/2023 0920   ALBUMIN 3.9 05/21/2023 0920     Present during today's visit: patient and her mom  Start plan: Patient will start capecitabine on 06/11/23   Patient Education I spoke with patient for overview of new oral chemotherapy medication: capecitabine   Administration: Counseled patient on administration, dosing, side effects,  monitoring, drug-food interactions, safe handling, storage, and disposal. Patient will take 3 tablets (1,500 mg total) by mouth 2 (two) times daily after a meal. Take for 14 days, then hold for 7 days. Repeat every 21 days.   Side Effects: Side effects include but not limited to: diarrhea, hand-foot syndrome, mouth sores, edema, decreased wbc, fatigue, N/V Diarrhea: Patient knows to use loperamide as needed and call the office if they are having 4 or more loose stools per day. Hand-foot syndrome: Recommended the use of Udderly Smooth Extra Care 20 and bottle provided during today's visit. Patient knows to report any skin changes they notice.  Mouth sores: Patient knows to request magic mouthwash if needed  Drug-drug Interactions (DDI): No current DDIs with capecitabine  Adherence: After discussion with patient no patient barriers to medication adherence identified.  Reviewed with patient importance of keeping a medication schedule and plan for any missed doses.  Ms. Matthewson voiced understanding and appreciation. All questions answered. Medication handout provided.  Provided patient with Oral Chemotherapy Navigation Clinic phone number. Patient knows to call the office with questions or concerns. Oral Chemotherapy Navigation Clinic will continue to follow.  Patient expressed understanding and was in agreement with this plan. She also understands that She can call clinic at any time with any questions, concerns, or complaints.   Medication Access Issues: No issues patient fills her medication at [facility] (Specialty)  Follow-up plan: RTC in 4 weeks  Thank you for allowing me to participate in the care of this patient.   Time Total: 20 mins  Visit consisted of counseling and education on dealing with issues of symptom management in the setting of serious and potentially life-threatening illness.Greater than 50%  of this time was spent counseling and coordinating care related to  the above assessment and plan.  Signed by: Remi Haggard, PharmD, BCPS, Nolon Bussing, CPP Hematology/Oncology Clinical Pharmacist Practitioner [place] [phone]  06/10/2023 10:44 AM

## 2023-06-10 NOTE — Progress Notes (Signed)
Patient here for oncology follow-up appointment, expresses no complaints or concerns at this time.    

## 2023-06-11 ENCOUNTER — Encounter: Payer: Self-pay | Admitting: *Deleted

## 2023-06-11 ENCOUNTER — Encounter: Payer: Self-pay | Admitting: Oncology

## 2023-06-13 ENCOUNTER — Other Ambulatory Visit: Payer: Self-pay | Admitting: Oncology

## 2023-06-13 MED ORDER — ONDANSETRON HCL 4 MG PO TABS: 4.0000 mg | ORAL_TABLET | Freq: Three times a day (TID) | ORAL | 2 refills | Status: DC | PRN

## 2023-06-19 ENCOUNTER — Other Ambulatory Visit: Payer: Self-pay

## 2023-06-19 ENCOUNTER — Other Ambulatory Visit (HOSPITAL_COMMUNITY): Payer: Self-pay

## 2023-06-19 NOTE — Progress Notes (Signed)
Specialty Pharmacy Ongoing Clinical Assessment Note  Eileen Hart is a 32 y.o. female who is being followed by the specialty pharmacy service for RxSp Oncology   Patient's specialty medication(s) reviewed today: Capecitabine (XELODA)   Missed doses in the last 4 weeks: 0   Patient/Caregiver did not have any additional questions or concerns.   Therapeutic benefit summary: Unable to assess   Adverse events/side effects summary: Experienced adverse events/side effects (Mild, tolerable nausea)   Patient's therapy is appropriate to: Continue    Goals Addressed             This Visit's Progress    Achieve a cure       Patient is unable to be assessed as therapy was recently initiated. Patient will maintain adherence          Follow up:  3 months  Otto Herb Specialty Pharmacist

## 2023-06-19 NOTE — Progress Notes (Signed)
Specialty Pharmacy Refill Coordination Note  Eileen Hart is a 32 y.o. female contacted today regarding refills of specialty medication(s) Capecitabine (XELODA)   Patient requested Delivery   Delivery date: 06/26/23   Verified address: [address], [place], [place] [address]   Medication will be filled on 06/24/23.

## 2023-06-23 ENCOUNTER — Encounter: Payer: Self-pay | Admitting: Surgical

## 2023-06-23 ENCOUNTER — Ambulatory Visit (INDEPENDENT_AMBULATORY_CARE_PROVIDER_SITE_OTHER): Payer: Medicaid Other | Admitting: Surgical

## 2023-06-23 VITALS — BP 118/79 | HR 83

## 2023-06-23 DIAGNOSIS — C50919 Malignant neoplasm of unspecified site of unspecified female breast: Secondary | ICD-10-CM

## 2023-06-23 DIAGNOSIS — Z171 Estrogen receptor negative status [ER-]: Secondary | ICD-10-CM

## 2023-06-23 DIAGNOSIS — C50411 Malignant neoplasm of upper-outer quadrant of right female breast: Secondary | ICD-10-CM

## 2023-06-23 NOTE — Progress Notes (Signed)
Patient is a 32 year old female here for follow-up on her bilateral breast reconstruction.  She had tissue expanders placed on 05/08/2023.  She is just over 6 weeks postop.  Of note, prior to surgery she had implants in place in the submuscular plane.  Patient is doing really well, she reports after last appointment she noticed an improvement in her pain.  She no longer has much pain in the lateral breast/axilla.  She is tolerating expansion well and would like another 50 cc placed.  She reports her goal size is at least 400 cc, but we will plan to expand 1 more time and evaluate at that point.  She is not having any infectious symptoms.  She reports that she will finish chemotherapy around June and then we can plan for exchange around July.  She reports she will not need radiation.  Chaperone present on exam On exam bilateral NAC's are viable, bilateral breast incisions along the IMF are intact and appear to be healing well.  There is no erythema or cellulitic changes noted of either breast.  No subcutaneous fluid collection noted.  Bilateral breasts are symmetric.  A/P:  No signs of infection or concern on exam.  We placed injectable saline in the Expander using a sterile technique: Right: 50 cc for a total of 350 / 455 cc Left: 50 cc for a total of 350 / 455 cc  Recommend following up in 3 weeks for reevaluation.

## 2023-06-24 ENCOUNTER — Other Ambulatory Visit: Payer: Self-pay

## 2023-06-26 ENCOUNTER — Other Ambulatory Visit: Payer: Self-pay

## 2023-06-26 ENCOUNTER — Encounter: Payer: Self-pay | Admitting: Pharmacist

## 2023-07-09 ENCOUNTER — Other Ambulatory Visit: Payer: Self-pay

## 2023-07-09 ENCOUNTER — Encounter: Payer: Self-pay | Admitting: Oncology

## 2023-07-09 ENCOUNTER — Inpatient Hospital Stay (HOSPITAL_BASED_OUTPATIENT_CLINIC_OR_DEPARTMENT_OTHER): Payer: Medicaid Other | Admitting: Oncology

## 2023-07-09 ENCOUNTER — Other Ambulatory Visit: Payer: Self-pay | Admitting: Oncology

## 2023-07-09 ENCOUNTER — Inpatient Hospital Stay: Payer: Medicaid Other | Attending: Oncology

## 2023-07-09 ENCOUNTER — Other Ambulatory Visit (HOSPITAL_COMMUNITY): Payer: Self-pay

## 2023-07-09 VITALS — BP 112/90 | HR 94 | Temp 98.8°F | Resp 18 | Wt 112.8 lb

## 2023-07-09 DIAGNOSIS — T451X5A Adverse effect of antineoplastic and immunosuppressive drugs, initial encounter: Secondary | ICD-10-CM | POA: Diagnosis not present

## 2023-07-09 DIAGNOSIS — Z9013 Acquired absence of bilateral breasts and nipples: Secondary | ICD-10-CM | POA: Diagnosis not present

## 2023-07-09 DIAGNOSIS — Z170 Estrogen receptor positive status [ER+]: Secondary | ICD-10-CM | POA: Insufficient documentation

## 2023-07-09 DIAGNOSIS — C50411 Malignant neoplasm of upper-outer quadrant of right female breast: Secondary | ICD-10-CM

## 2023-07-09 DIAGNOSIS — Z79899 Other long term (current) drug therapy: Secondary | ICD-10-CM | POA: Insufficient documentation

## 2023-07-09 DIAGNOSIS — Z171 Estrogen receptor negative status [ER-]: Secondary | ICD-10-CM

## 2023-07-09 DIAGNOSIS — Z87891 Personal history of nicotine dependence: Secondary | ICD-10-CM | POA: Insufficient documentation

## 2023-07-09 DIAGNOSIS — D709 Neutropenia, unspecified: Secondary | ICD-10-CM | POA: Insufficient documentation

## 2023-07-09 DIAGNOSIS — D702 Other drug-induced agranulocytosis: Secondary | ICD-10-CM

## 2023-07-09 DIAGNOSIS — C50811 Malignant neoplasm of overlapping sites of right female breast: Secondary | ICD-10-CM | POA: Diagnosis present

## 2023-07-09 LAB — CBC WITH DIFFERENTIAL/PLATELET
Abs Immature Granulocytes: 0 10*3/uL (ref 0.00–0.07)
Basophils Absolute: 0 10*3/uL (ref 0.0–0.1)
Basophils Relative: 1 %
Eosinophils Absolute: 0 10*3/uL (ref 0.0–0.5)
Eosinophils Relative: 1 %
HCT: 39 % (ref 36.0–46.0)
Hemoglobin: 13.2 g/dL (ref 12.0–15.0)
Immature Granulocytes: 0 %
Lymphocytes Relative: 52 %
Lymphs Abs: 1.4 10*3/uL (ref 0.7–4.0)
MCH: 30.1 pg (ref 26.0–34.0)
MCHC: 33.8 g/dL (ref 30.0–36.0)
MCV: 89 fL (ref 80.0–100.0)
Monocytes Absolute: 0.2 10*3/uL (ref 0.1–1.0)
Monocytes Relative: 8 %
Neutro Abs: 1 10*3/uL — ABNORMAL LOW (ref 1.7–7.7)
Neutrophils Relative %: 38 %
Platelets: 153 10*3/uL (ref 150–400)
RBC: 4.38 MIL/uL (ref 3.87–5.11)
RDW: 13.2 % (ref 11.5–15.5)
WBC: 2.6 10*3/uL — ABNORMAL LOW (ref 4.0–10.5)
nRBC: 0 % (ref 0.0–0.2)

## 2023-07-09 LAB — COMPREHENSIVE METABOLIC PANEL
ALT: 14 U/L (ref 0–44)
AST: 18 U/L (ref 15–41)
Albumin: 4.8 g/dL (ref 3.5–5.0)
Alkaline Phosphatase: 50 U/L (ref 38–126)
Anion gap: 9 (ref 5–15)
BUN: 14 mg/dL (ref 6–20)
CO2: 25 mmol/L (ref 22–32)
Calcium: 9.9 mg/dL (ref 8.9–10.3)
Chloride: 103 mmol/L (ref 98–111)
Creatinine, Ser: 0.71 mg/dL (ref 0.44–1.00)
GFR, Estimated: >60 mL/min (ref >60)
Glucose, Bld: 104 mg/dL — ABNORMAL HIGH (ref 70–99)
Potassium: 4.6 mmol/L (ref 3.5–5.1)
Sodium: 137 mmol/L (ref 135–145)
Total Bilirubin: 0.8 mg/dL (ref 0.0–1.2)
Total Protein: 7.6 g/dL (ref 6.5–8.1)

## 2023-07-09 MED ORDER — CAPECITABINE 500 MG PO TABS
1500.0000 mg | ORAL_TABLET | Freq: Two times a day (BID) | ORAL | 1 refills | Status: DC
  Filled 2023-07-09: qty 84, 21d supply, fill #0
  Filled 2023-08-05: qty 84, 21d supply, fill #1

## 2023-07-09 NOTE — Progress Notes (Signed)
 Specialty Pharmacy Refill Coordination Note  LUZ REITMEIER is a 33 y.o. female contacted today regarding refills of specialty medication(s) Capecitabine  (XELODA )   Patient requested Delivery   Delivery date: 07/16/23   Verified address: [address], [place], [place] [address]   Medication will be filled on 01.21.25.   Refill request pending - notify patient if delayed, next cycle will start around 1.29

## 2023-07-09 NOTE — Telephone Encounter (Signed)
 CBC with Differential (Cancer Center Only) Order: [id number]  Status: Final result     Next appt: 07/17/2023 at 01:00 PM in Plastic Surgery Janalyn Me Scheeler, PA-C)     Dx: Malignant neoplasm of upper-outer qua...   Test Result Released: Yes (seen)   0 Result Notes          Component Ref Range & Units (hover) 4 wk ago (06/10/23) 1 mo ago (05/21/23) 2 mo ago (05/09/23) 2 mo ago (04/21/23) 3 mo ago (03/31/23) 3 mo ago (03/24/23) 3 mo ago (03/17/23)  WBC Count 3.8 Low  3.5 Low  9.0 3.3 Low  3.7 Low  3.4 Low  3.5 Low   RBC 4.34 3.74 Low  3.39 Low  3.54 Low  3.59 Low  3.44 Low  3.53 Low   Hemoglobin 12.9 11.6 Low  10.6 Low  11.2 Low  11.4 Low  11.0 Low  11.1 Low   HCT 38.8 34.5 Low  31.1 Low  33.7 Low  33.9 Low  32.2 Low  33.5 Low   MCV 89.4 92.2 91.7 95.2 94.4 93.6 94.9  MCH 29.7 31.0 31.3 31.6 31.8 32.0 31.4  MCHC 33.2 33.6 34.1 33.2 33.6 34.2 33.1  RDW 11.4 Low  11.7 11.6 13.2 15.4 15.3 15.4  Platelet Count 168 234 155 159 235 187 202  nRBC 0.0 0.0 0.0 CM 0.0 0.0 0.0 0.0  Neutrophils Relative % 52 50  48 42 44 33  Neutro Abs 1.9 1.7  1.6 Low  1.6 Low  1.5 Low  1.2 Low   Lymphocytes Relative 36 39  34 42 39 45  Lymphs Abs 1.4 1.3  1.1 1.6 1.3 1.6  Monocytes Relative 9 8  13 12 14 17   Monocytes Absolute 0.4 0.3  0.4 0.4 0.5 0.6  Eosinophils Relative 2 3  4 2 2 3   Eosinophils Absolute 0.1 0.1  0.1 0.1 0.1 0.1  Basophils Relative 1 0  1 1 1 1   Basophils Absolute 0.0 0.0  0.0 0.0 0.0 0.0  Immature Granulocytes 0 0  0 1 0 1  Abs Immature Granulocytes 0.01 0.01 CM  0.00 CM 0.03 CM 0.01 CM 0.02 CM  Comment: Performed at [facility], [address] [place], [place] [address]  Resulting Agency [facility]        Specimen Collected: 06/10/23 10:23 Last Resulted: 06/10/23 10:43      Lab Flowsheet      Order Details      View Encounter      Lab and Collection Details      Routing      Result  History    View All Conversations on this Encounter    CM=Additional comments    Result Care Coordination   Patient Communication   Add Comments   Seen Back to Top   Other Results from 06/10/2023  CMP (Cancer Center only) Order: [id number]  Status: Final result     Next appt: 07/17/2023 at 01:00 PM in Plastic Surgery Janalyn Me Scheeler, PA-C)     Dx: Malignant neoplasm of upper-outer qua...     Test Result Released: Yes (seen)   0 Result Notes          Component Ref Range & Units (hover) 4 wk ago (06/10/23) 1 mo ago (05/21/23) 2 mo ago (05/09/23) 3 mo ago (03/31/23) 3 mo ago (03/24/23) 3 mo ago (03/17/23)  4 mo ago (03/10/23)  Sodium 139 139 138 135 135 137 135  Potassium 5.0 4.0 4.4 3.8 3.8 4.0 3.7  Chloride 106 105 104 104 105 106 108  CO2 27 25 25 24 24 22 22   Glucose, Bld 95 100 High  CM 135 High  CM 106 High  CM 96 CM 104 High  CM 93 CM  Comment: Glucose reference range applies only to samples taken after fasting for at least 8 hours.  BUN 13 10 14 8 12 9 10   Creatinine 0.78 0.54 0.75 0.60 0.37 Low  0.53 0.46  Calcium 10.0 9.4 8.9 8.9 8.8 Low  9.1 8.8 Low   Total Protein 7.6 7.0 6.3 Low  7.0 7.1 6.9 6.5  Albumin 4.7 3.9 3.6 4.2 4.1 4.0 3.9  AST 15 44 High  28 27 42 High  29 26  ALT 12 67 High  25 36 75 High  43 35  Alkaline Phosphatase 44 46 43 70 65 55 40  Total Bilirubin 0.6 0.6 0.5 0.3 R 0.6 R 0.5 R 0.2 Low  R  GFR, Estimated >60   >60 CM >60 CM >60 CM >60 CM  Comment: (NOTE) Calculated using the CKD-EPI Creatinine Equation (2021)  Anion gap 6 9 CM 9 CM 7 CM 6 CM 9 CM 5 CM  Comment: Performed at [facility], [address] [place], [place] [address]  Resulting Agency [facility]        Specimen Collected: 06/10/23 10:23 Last Resulted: 06/10/23 11:50

## 2023-07-09 NOTE — Progress Notes (Signed)
 Hematology/Oncology Consult note [facility]  [phone] [phone]  Patient Care Team: Kent Pear, MD as PCP - General (Family Medicine) Waverly Hageman, RN as Oncology Nurse Navigator Avonne Boettcher, MD as Consulting Physician (Oncology) Alben Alma, MD as Consulting Physician (General Surgery) Dillingham, Lindaann Requena, DO as Attending Physician (Plastic Surgery)   Name of the patient: Eileen Hart  [id number]  [birth year]   Date of visit: 07/09/23  Diagnosis-  clinical prognostic stage Ib invasive mammary carcinoma of the right breast cT1 cN0 M0 triple negative     Chief complaint/ Reason for visit-routine follow-up of breast cancer on adjuvant Xeloda   Heme/Onc history: Patient is a 33 year old female who underwent a bilateral diagnostic mammogram on 10/07/2022 after she felt a palpable area of concern in her right breast. Mammogram showed a 1.6 x 1 x 1.5 cm solid mass in the right breast 9:30 position 5 cm from the nipple. There was also an adjacent 6 x 9 x 2 mm mass in the right breast. No right axillary adenopathy. The dominant mass was biopsied and was consistent with invasive mammary carcinoma grade 3 ER and HER2 negative.    Bilateral breast MRI showed additional areas of concern in the right breast.  2 additional enhancing masses 1 in the 12 o'clock position and 1 in the 4 o'clock position.  2 adjacent prominent right axillary lymph nodes.  2 intermittent masses in the left breast 9 o'clock position and upper outer left breast.  This was followed by a dedicated ultrasound.  She had 3 breast biopsies in the right side and 1 left breast biopsy and all of them were negative for malignancy.  On the ultrasound her right axillary lymph nodes appeared normal and therefore biopsy was not recommended for the same.   PET CT scan showed focal hypermetabolic activity in the right breast adjacent to the biopsy clip but no other additional areas  of concern.  Baseline MUGA scan showed normal EF.   Given that additional biopsies were negative for malignancy and the only biopsy-proven site was 1.6 cm triple negative breast cancer with negative lymph nodes plan was to do [facility] Taxol  chemotherapy neoadjuvant without opting for keynote 522 regimen.    Mild decrease in the size of primary breast mass after 4 cycles of AC chemotherapy. Carboplatin  added to taxol  neoadjuvantly   Patient underwent bilateral mastectomy with reconstruction on 05/08/2023.No malignancy noted in the left breast. She was found to have 0.6 cm invasive ductal carcinoma high-grade in the right breast. There was another area of fibromatoid change noted adjacent to it and the intervening parenchyma between these 2 areas showed an additional 0.4 cm invasive ductal carcinoma 2 sentinel lymph nodes negative for malignancy. No definitive response to presurgical therapy in the invasive carcinoma. Overall cancer cellularity 95%. ypT1b N0.     Interval history-patient started cycle 1 of Xeloda  and has 1 more week remaining before her week off.  So far she is tolerating it well and denies any significant nausea skin rash or diarrhea  ECOG PS- 0 Pain scale-0  Review of systems- Review of Systems  Constitutional:  Negative for chills, fever, malaise/fatigue and weight loss.  HENT:  Negative for congestion, ear discharge and nosebleeds.   Eyes:  Negative for blurred vision.  Respiratory:  Negative for cough, hemoptysis, sputum production, shortness of breath and wheezing.   Cardiovascular:  Negative for chest pain, palpitations, orthopnea and claudication.  Gastrointestinal:  Negative for  abdominal pain, blood in stool, constipation, diarrhea, heartburn, melena, nausea and vomiting.  Genitourinary:  Negative for dysuria, flank pain, frequency, hematuria and urgency.  Musculoskeletal:  Negative for back pain, joint pain and myalgias.  Skin:  Negative for rash.  Neurological:  Negative  for dizziness, tingling, focal weakness, seizures, weakness and headaches.  Endo/Heme/Allergies:  Does not bruise/bleed easily.  Psychiatric/Behavioral:  Negative for depression and suicidal ideas. The patient does not have insomnia.       No Known Allergies   Past Medical History:  Diagnosis Date   Chlamydia 01/25/2010   Depression with anxiety    Gestational hypertension    Immunization, viral disease    gardasil series completed   Invasive carcinoma of breast (HCC)    Tobacco abuse 05/02/2016     Past Surgical History:  Procedure Laterality Date   AUGMENTATION MAMMAPLASTY Bilateral    saline implants 2015   BREAST BIOPSY Right 10/14/2022   us  biopsy/ coil clip/ path pending   BREAST BIOPSY Right 10/14/2022   US  RT BREAST BX W LOC DEV 1ST LESION IMG BX SPEC US  GUIDE 10/14/2022 [facility]   BREAST BIOPSY Left 10/29/2022   Us  Core Bx Ribbon clip- path pending   BREAST BIOPSY Right 10/29/2022   US  Core 1130 Venus Clip- path pending   BREAST BIOPSY Right 10/29/2022   Us  Core Bx Retroareolar heart clip-path pending   BREAST BIOPSY Right 10/29/2022   Us  Core Bx Ribbon Clip path pending   BREAST BIOPSY Right 10/29/2022   US  RT BREAST BX W LOC DEV EA ADD LESION IMG BX SPEC US  GUIDE 10/29/2022 [facility]   BREAST BIOPSY Right 10/29/2022   US  RT BREAST BX W LOC DEV 1ST LESION IMG BX SPEC US  GUIDE 10/29/2022 [facility]   BREAST BIOPSY Right 10/29/2022   US  RT BREAST BX W LOC DEV EA ADD LESION IMG BX SPEC US  GUIDE 10/29/2022 [facility]   BREAST BIOPSY Left 10/29/2022   US  LT BREAST BX W LOC DEV 1ST LESION IMG BX SPEC US  GUIDE 10/29/2022 [facility]   BREAST ENHANCEMENT SURGERY  2015   BREAST RECONSTRUCTION WITH PLACEMENT OF TISSUE EXPANDER AND FLEX HD (ACELLULAR HYDRATED DERMIS) Bilateral 05/08/2023   Procedure: Bilateral immediate breast reconstruction with expanders and Flex HD placement;  Surgeon: Thornell Flirt, DO;  Location: [facility];  Service: Plastics;   Laterality: Bilateral;   CESAREAN SECTION  2014   MASTECTOMY W/ SENTINEL NODE BIOPSY Bilateral 05/08/2023   Procedure: MASTECTOMY Simple WITH SENTINEL LYMPH NODE BIOPSY, RNFA to assist;  Surgeon: Alben Alma, MD;  Location: [facility];  Service: General;  Laterality: Bilateral;   PORTACATH PLACEMENT N/A 10/24/2022   Procedure: INSERTION PORT-A-CATH;  Surgeon: Alben Alma, MD;  Location: [facility];  Service: General;  Laterality: N/A;   TONSILLECTOMY      Social History   Socioeconomic History   Marital status: Single    Spouse name: Not on file   Number of children: 1   Years of education: 15   Highest education level: Not on file  Occupational History   Occupation: Groomer    Comment: [employer]  Tobacco Use   Smoking status: Former    Current packs/day: 0.00    Types: Cigarettes    Quit date: 07/25/2018    Years since quitting: 4.9    Passive exposure: Past   Smokeless tobacco: Never   Tobacco comments:    quit 07/2018  Vaping Use   Vaping status: Some Days  Substance and  Sexual Activity   Alcohol use: No    Alcohol/week: 0.0 standard drinks of alcohol   Drug use: No   Sexual activity: Yes    Birth control/protection: Pill  Other Topics Concern   Not on file  Social History Narrative   Not on file   Social Drivers of Health   Financial Resource Strain: Not on file  Food Insecurity: No Food Insecurity (05/08/2023)   Hunger Vital Sign    Worried About Running Out of Food in the Last Year: Never true    Ran Out of Food in the Last Year: Never true  Transportation Needs: No Transportation Needs (05/08/2023)   PRAPARE - Administrator, Civil Service (Medical): No    Lack of Transportation (Non-Medical): No  Physical Activity: Not on file  Stress: Not on file  Social Connections: Not on file  Intimate Partner Violence: Not At Risk (05/08/2023)   Humiliation, Afraid, Rape, and Kick questionnaire    Fear of Current or Ex-Partner: No     Emotionally Abused: No    Physically Abused: No    Sexually Abused: No    Family History  Problem Relation Age of Onset   Breast cancer Other 35       mat 2nd cousin   Diabetes Neg Hx    Heart disease Neg Hx    Hypertension Neg Hx    Ovarian cancer Neg Hx    Colon cancer Neg Hx      Current Outpatient Medications:    acetaminophen  (TYLENOL ) 500 MG tablet, Take 1,000 mg by mouth every 6 (six) hours as needed for moderate pain (pain score 4-6)., Disp: , Rfl:    capecitabine  (XELODA ) 500 MG tablet, Take 3 tablets (1,500 mg total) by mouth 2 (two) times daily after a meal. Take for 14 days, then hold for 7 days. Repeat every 21 days., Disp: 84 tablet, Rfl: 1   diazepam  (VALIUM ) 2 MG tablet, Take 1 tablet (2 mg total) by mouth every 12 (twelve) hours as needed for muscle spasms., Disp: 20 tablet, Rfl: 0   diazepam  (VALIUM ) 2 MG tablet, Take 1 tablet (2 mg total) by mouth every 12 (twelve) hours as needed for muscle spasms., Disp: 20 tablet, Rfl: 0   diclofenac  Sodium (VOLTAREN ) 1 % GEL, Research Patient: Apply 0.5 grams (1 fingertip) to each hand and each foot twice daily for up to 12 weeks, Disp: 400 g, Rfl: 0   gabapentin  (NEURONTIN ) 300 MG capsule, Take 1 capsule (300 mg total) by mouth 3 (three) times daily., Disp: 90 capsule, Rfl: 3   ibuprofen (ADVIL) 200 MG tablet, Take 600-800 mg by mouth every 6 (six) hours as needed for moderate pain (pain score 4-6)., Disp: , Rfl:    ondansetron  (ZOFRAN ) 4 MG tablet, Take 1 tablet (4 mg total) by mouth every 8 (eight) hours as needed for nausea or vomiting., Disp: 60 tablet, Rfl: 2  Physical exam:  Vitals:   07/09/23 1032  BP: (!) 112/90  Pulse: 94  Resp: 18  Temp: 98.8 F (37.1 C)  TempSrc: Tympanic  SpO2: 99%  Weight: 112 lb 12.8 oz (51.2 kg)   Physical Exam Cardiovascular:     Rate and Rhythm: Normal rate and regular rhythm.     Heart sounds: Normal heart sounds.  Pulmonary:     Effort: Pulmonary effort is normal.     Breath  sounds: Normal breath sounds.  Skin:    General: Skin is warm and dry.  Neurological:  Mental Status: She is alert and oriented to person, place, and time.         Latest Ref Rng & Units 07/09/2023    9:55 AM  CMP  Glucose 70 - 99 mg/dL 782   BUN 6 - 20 mg/dL 14   Creatinine 9.56 - 1.00 mg/dL 2.13   Sodium 086 - 578 mmol/L 137   Potassium 3.5 - 5.1 mmol/L 4.6   Chloride 98 - 111 mmol/L 103   CO2 22 - 32 mmol/L 25   Calcium 8.9 - 10.3 mg/dL 9.9   Total Protein 6.5 - 8.1 g/dL 7.6   Total Bilirubin 0.0 - 1.2 mg/dL 0.8   Alkaline Phos 38 - 126 U/L 50   AST 15 - 41 U/L 18   ALT 0 - 44 U/L 14       Latest Ref Rng & Units 07/09/2023    9:55 AM  CBC  WBC 4.0 - 10.5 K/uL 2.6   Hemoglobin 12.0 - 15.0 g/dL 46.9   Hematocrit 62.9 - 46.0 % 39.0   Platelets 150 - 400 K/uL 153      Assessment and plan- Patient is a 33 y.o. female  with history of stage Ib invasive mammary carcinoma of the right breast cT1 cN0 M0 triple negative status post neoadjuvant AC CarboTaxol chemotherapy.  She had 2 foci of residual disease 0.6 and 0.4 cm after neoadjuvant chemotherapy.  She is here for routine follow-up of breast cancer on adjuvant Xeloda   White cell count is 2.6 today with an ANC of 1 in between her Xeloda  cycle.  I would like to repeat her CBC with differential on 07/18/2023 right before she starts her cycle 2.  If ANC remains more than 1 she can proceed with cycle 2.  I will tentatively see her back in 4 to 5 weeks prior to start of cycle 3 of Xeloda .  Plan is to do 8 total cycles   Visit Diagnosis 1. Malignant neoplasm of upper-outer quadrant of right breast in female, estrogen receptor negative (HCC)   2. High risk medication use   3. Drug-induced neutropenia (HCC)      Dr. Seretha Dance, MD, MPH [facility] at [facility] [phone] 07/09/2023 1:07 PM

## 2023-07-11 ENCOUNTER — Other Ambulatory Visit: Payer: Self-pay

## 2023-07-15 ENCOUNTER — Other Ambulatory Visit: Payer: Self-pay

## 2023-07-17 ENCOUNTER — Encounter: Payer: Medicaid Other | Admitting: Surgical

## 2023-07-21 ENCOUNTER — Inpatient Hospital Stay: Payer: Medicaid Other

## 2023-07-21 DIAGNOSIS — C50811 Malignant neoplasm of overlapping sites of right female breast: Secondary | ICD-10-CM | POA: Diagnosis not present

## 2023-07-21 DIAGNOSIS — Z171 Estrogen receptor negative status [ER-]: Secondary | ICD-10-CM

## 2023-07-21 LAB — COMPREHENSIVE METABOLIC PANEL
ALT: 10 U/L (ref 0–44)
AST: 15 U/L (ref 15–41)
Albumin: 4.8 g/dL (ref 3.5–5.0)
Alkaline Phosphatase: 48 U/L (ref 38–126)
Anion gap: 8 (ref 5–15)
BUN: 12 mg/dL (ref 6–20)
CO2: 24 mmol/L (ref 22–32)
Calcium: 9.5 mg/dL (ref 8.9–10.3)
Chloride: 106 mmol/L (ref 98–111)
Creatinine, Ser: 0.76 mg/dL (ref 0.44–1.00)
GFR, Estimated: >60 mL/min (ref >60)
Glucose, Bld: 101 mg/dL — ABNORMAL HIGH (ref 70–99)
Potassium: 4.5 mmol/L (ref 3.5–5.1)
Sodium: 138 mmol/L (ref 135–145)
Total Bilirubin: 0.8 mg/dL (ref 0.0–1.2)
Total Protein: 7.3 g/dL (ref 6.5–8.1)

## 2023-07-21 LAB — CBC WITH DIFFERENTIAL/PLATELET
Abs Immature Granulocytes: 0 10*3/uL (ref 0.00–0.07)
Basophils Absolute: 0 10*3/uL (ref 0.0–0.1)
Basophils Relative: 0 %
Eosinophils Absolute: 0.1 10*3/uL (ref 0.0–0.5)
Eosinophils Relative: 3 %
HCT: 38.1 % (ref 36.0–46.0)
Hemoglobin: 12.7 g/dL (ref 12.0–15.0)
Immature Granulocytes: 0 %
Lymphocytes Relative: 35 %
Lymphs Abs: 1.1 10*3/uL (ref 0.7–4.0)
MCH: 30.1 pg (ref 26.0–34.0)
MCHC: 33.3 g/dL (ref 30.0–36.0)
MCV: 90.3 fL (ref 80.0–100.0)
Monocytes Absolute: 0.3 10*3/uL (ref 0.1–1.0)
Monocytes Relative: 11 %
Neutro Abs: 1.6 10*3/uL — ABNORMAL LOW (ref 1.7–7.7)
Neutrophils Relative %: 51 %
Platelets: 171 10*3/uL (ref 150–400)
RBC: 4.22 MIL/uL (ref 3.87–5.11)
RDW: 15.1 % (ref 11.5–15.5)
WBC: 3.2 10*3/uL — ABNORMAL LOW (ref 4.0–10.5)
nRBC: 0 % (ref 0.0–0.2)

## 2023-07-22 ENCOUNTER — Encounter: Payer: Self-pay | Admitting: Surgical

## 2023-07-22 ENCOUNTER — Ambulatory Visit (INDEPENDENT_AMBULATORY_CARE_PROVIDER_SITE_OTHER): Payer: Medicaid Other | Admitting: Surgical

## 2023-07-22 VITALS — BP 111/72 | HR 73 | Ht 63.0 in | Wt 114.8 lb

## 2023-07-22 DIAGNOSIS — C50919 Malignant neoplasm of unspecified site of unspecified female breast: Secondary | ICD-10-CM

## 2023-07-22 DIAGNOSIS — Z171 Estrogen receptor negative status [ER-]: Secondary | ICD-10-CM

## 2023-07-22 DIAGNOSIS — C50411 Malignant neoplasm of upper-outer quadrant of right female breast: Secondary | ICD-10-CM

## 2023-07-22 NOTE — Progress Notes (Signed)
Patient is a 33 year old female here for follow-up on her bilateral breast reconstruction.  She had bilateral tissue expanders placed on 05/08/2023 with Dr. Ulice Bold  Of note, expanders were placed in the subpectoral plane.  We have discussed that she will not be able to have expander to implant exchange until late June early July due to current chemotherapy regimen.   She is just over 2 months postop.  Patient reports he is overall doing well, she tolerated her  Chaperone present on exam On exam bilateral breast incisions are intact, well-healed.  Bilateral NAC's are viable. Bilateral tissue expanders are in place, no subcutaneous fluid collection noted palpation.  There is no erythema or cellulitic changes noted of either breast. Symmetry is noted.   A/P:  We will plan to see her back in 1 month for reevaluation and additional fill. There is no signs of infection or concern on exam.  We placed injectable saline in the Expander using a sterile technique: Right: 50 cc for a total of 400 / 455 cc Left: 50 cc for a total of 400 / 455 cc

## 2023-08-05 ENCOUNTER — Other Ambulatory Visit (HOSPITAL_COMMUNITY): Payer: Self-pay

## 2023-08-05 ENCOUNTER — Other Ambulatory Visit: Payer: Self-pay

## 2023-08-05 NOTE — Progress Notes (Signed)
Specialty Pharmacy Refill Coordination Note  Eileen Hart is a 33 y.o. female contacted today regarding refills of specialty medication(s) Capecitabine (XELODA)   Patient requested Delivery   Delivery date: 08/07/23   Verified address: [address]   [place] [address]   Medication will be filled on 08/06/23.

## 2023-08-06 DIAGNOSIS — Z9889 Other specified postprocedural states: Secondary | ICD-10-CM

## 2023-08-06 DIAGNOSIS — C50919 Malignant neoplasm of unspecified site of unspecified female breast: Secondary | ICD-10-CM

## 2023-08-06 DIAGNOSIS — Z171 Estrogen receptor negative status [ER-]: Secondary | ICD-10-CM

## 2023-08-07 ENCOUNTER — Ambulatory Visit: Payer: Medicaid Other | Attending: Surgical

## 2023-08-07 DIAGNOSIS — C50411 Malignant neoplasm of upper-outer quadrant of right female breast: Secondary | ICD-10-CM | POA: Insufficient documentation

## 2023-08-07 DIAGNOSIS — C50919 Malignant neoplasm of unspecified site of unspecified female breast: Secondary | ICD-10-CM | POA: Insufficient documentation

## 2023-08-07 DIAGNOSIS — Z9889 Other specified postprocedural states: Secondary | ICD-10-CM | POA: Diagnosis not present

## 2023-08-07 DIAGNOSIS — Z171 Estrogen receptor negative status [ER-]: Secondary | ICD-10-CM | POA: Diagnosis not present

## 2023-08-07 DIAGNOSIS — M79601 Pain in right arm: Secondary | ICD-10-CM | POA: Diagnosis present

## 2023-08-07 DIAGNOSIS — M25611 Stiffness of right shoulder, not elsewhere classified: Secondary | ICD-10-CM | POA: Diagnosis present

## 2023-08-07 DIAGNOSIS — M25621 Stiffness of right elbow, not elsewhere classified: Secondary | ICD-10-CM | POA: Insufficient documentation

## 2023-08-07 DIAGNOSIS — M6281 Muscle weakness (generalized): Secondary | ICD-10-CM | POA: Diagnosis present

## 2023-08-07 NOTE — Therapy (Signed)
OUTPATIENT PHYSICAL THERAPY EVALUATION   Patient Name: Eileen Hart MRN: [id number] DOB:[birth year], 33 y.o., female Today's Date: 08/08/2023  END OF SESSION:  PT End of Session - 08/08/23 1018     Visit Number 1    Number of Visits 25    Date for PT Re-Evaluation 10/30/23    PT Start Time 2301    PT Stop Time 2345    PT Time Calculation (min) 44 min    Activity Tolerance Patient tolerated treatment well    Behavior During Therapy [facility] for tasks assessed/performed             Past Medical History:  Diagnosis Date   Chlamydia 01/25/2010   Depression with anxiety    Gestational hypertension    Immunization, viral disease    gardasil series completed   Invasive carcinoma of breast (HCC)    Tobacco abuse 05/02/2016   Past Surgical History:  Procedure Laterality Date   AUGMENTATION MAMMAPLASTY Bilateral    saline implants 2015   BREAST BIOPSY Right 10/14/2022   US biopsy/ coil clip/ path pending   BREAST BIOPSY Right 10/14/2022   [place] RT BREAST BX W LOC DEV 1ST LESION IMG BX SPEC US GUIDE 10/14/2022 [facility]   BREAST BIOPSY Left 10/29/2022   [place] Core Bx Ribbon clip- path pending   BREAST BIOPSY Right 10/29/2022   [place] Core 1130 Venus Clip- path pending   BREAST BIOPSY Right 10/29/2022   [place] Core Bx Retroareolar heart clip-path pending   BREAST BIOPSY Right 10/29/2022   [place] Core Bx Ribbon Clip path pending   BREAST BIOPSY Right 10/29/2022   [place] RT BREAST BX W LOC DEV EA ADD LESION IMG BX SPEC US GUIDE 10/29/2022 [facility]   BREAST BIOPSY Right 10/29/2022   [place] RT BREAST BX W LOC DEV 1ST LESION IMG BX SPEC US GUIDE 10/29/2022 [facility]   BREAST BIOPSY Right 10/29/2022   [place] RT BREAST BX W LOC DEV EA ADD LESION IMG BX SPEC US GUIDE 10/29/2022 [facility]   BREAST BIOPSY Left 10/29/2022   [place] LT BREAST BX W LOC DEV 1ST LESION IMG BX SPEC US GUIDE 10/29/2022 [facility]   BREAST ENHANCEMENT SURGERY  2015   BREAST RECONSTRUCTION WITH PLACEMENT OF TISSUE EXPANDER  AND FLEX HD (ACELLULAR HYDRATED DERMIS) Bilateral 05/08/2023   Procedure: Bilateral immediate breast reconstruction with expanders and Flex HD placement;  Surgeon: Peggye Form, DO;  Location: [facility];  Service: Plastics;  Laterality: Bilateral;   CESAREAN SECTION  2014   MASTECTOMY W/ SENTINEL NODE BIOPSY Bilateral 05/08/2023   Procedure: MASTECTOMY Simple WITH SENTINEL LYMPH NODE BIOPSY, RNFA to assist;  Surgeon: Leafy Ro, MD;  Location: [facility];  Service: General;  Laterality: Bilateral;   PORTACATH PLACEMENT N/A 10/24/2022   Procedure: INSERTION PORT-A-CATH;  Surgeon: Leafy Ro, MD;  Location: [facility];  Service: General;  Laterality: N/A;   TONSILLECTOMY     Patient Active Problem List   Diagnosis Date Noted   S/P bilateral mastectomy 05/08/2023   Genetic testing 10/30/2022   Invasive carcinoma of breast (HCC) 10/22/2022   Malignant neoplasm of upper-outer quadrant of right breast in female, estrogen receptor negative (HCC) 10/22/2022   Lump of right breast 09/18/2022   Weight gain 12/04/2020   Right wrist pain 09/27/2019   Contraceptive management 07/07/2019   Anxiety and depression 12/29/2015    PCP: Glori Luis, MD  REFERRING PROVIDER: Scheeler, Kermit Balo, PA-C   REFERRING DIAG:  Diagnosis  C50.411,Z17.1 (ICD-10-CM) -  Malignant neoplasm of upper-outer quadrant of right breast in female, estrogen receptor negative (HCC)  C50.919 (ICD-10-CM) - Invasive carcinoma of breast (HCC)  Z98.890 (ICD-10-CM) - S/P breast reconstruction   Referral note: "Recovery after bilateral mastectomy and bilateral breast reconstruction 04/2023. Has expanders in place. Patient is noticing restricted ROM. Need eval for possible lymphedema as well.   Thank you all for your help."   Rationale for Evaluation and Treatment: Rehabilitation  THERAPY DIAG:  Stiffness of right shoulder, not elsewhere classified  Stiffness of right elbow, not elsewhere  classified  Muscle weakness (generalized)  Pain in right arm  ONSET DATE: 04/2023   SUBJECTIVE:                                                                                                                                                                                           SUBJECTIVE STATEMENT: The pt is a pleasant 33 y/o female, hx of breast CA, presenting to PT s/p bilateral mastectomy with bilateral breast reconstruction 05/08/2023 with expanders in place. Pt would like to address residual RUE pain, decreased ROM and strength. Pt reports pain travels from R armpit to wrist. She has difficulty fully straightening her R arm, which is impacting her ability to reach, dress, and push or pull with this arm. She is R handed. Pt also notes tightness and pressure in her R shoulder. Her strength is not what it used to be. She experiences tingling/numbness in RUE after using her phone for a while. 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 Pt reports no current activity restrictions she is aware of, but is not lifting items over 40#. She still has expanders in. Currently on chemo pill. Additionally, reports hx of R wrist tendon pain years ago, feels it every once in a while. 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 Pt has 20 y/o son.    PERTINENT HISTORY:   Pt underwent bilateral mastectomy and bilateral breast reconstruction 05/08/2023. Pt has expanders in place.  Per oncology "Patient...who underwent a bilateral diagnostic mammogram on 10/07/2022 after she felt a palpable area of concern in her right breast. Mammogram showed a 1.6 x 1 x 1.5 cm solid mass in the right breast 9:30 position 5 cm from the nipple. There was also an adjacent 6 x 9 x 2 mm mass in the right breast. No right axillary adenopathy. The dominant mass was biopsied and was consistent with invasive mammary carcinoma grade 3 ER and HER2 negative. "  Other PMH per  chart consists of tobacco use, depression  with anxiety  PAIN:  Are you having pain?  Avg pain: 2-4/10 worst: 4-5/10; at rest it can be 0/10 Describes pain as sharp & tight, "it's almost like there's a cord in my arm"   PRECAUTIONS:  Pt reports no current activity restrictions she is aware of, but is not lifting items over 40#.  RED FLAGS: Pt currently being treated for breast CA, no other red flags per report     WEIGHT BEARING RESTRICTIONS:  No  FALLS:  Has patient fallen in last 6 months? 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  DIAGNOSTIC FINDINGS:  Please refer to chart for past imaging reports PATIENT SURVEYS:  Quick Dash 34.1  COGNITIVE STATUS: Within functional limits for tasks assessed   SENSATION: WNL UE   Coordination: Rapid alt UE is WNL   EDEMA: OT lymphedema referral request being placed to address swelling of RUE    POSTURE:  No Significant postural limitations  HAND DOMINANCE:  Right  Body Part #1 Shoulder  PALPATION: TrP noted R UT, none currently, but pt reports she tends to be TTP near R axilla   MMT (strength testing): Gross RUE is 4/5 Gross LUE strength is 4+/5 Grip strength 4+/5 bilat   AROM BUE: Shoulder Flexion: RUE: 135 deg and pain-limited LUE: 150 deg  Shoulder abduction: RUE: 119 deg and pain-limited Left: 137 deg  Shoulder ER:  RUE: 65 deg LUE: 60 deg - slight tightness but no pain   Shoulder IR: no formal measurement taken but observed WNL bilat  Elbow ROM: Flexion WNL bilat  Extension:  Lacking 10 deg RUE Able to achieve 0 deg LUE                                                                                                                             TREATMENT DATE: 08/07/2023   Self-Care Home management: Review of HEP in session, pt completes the below interventions to confirm correct technique, cuing to perform in pain-free ranges. PT provides pt with therabands for home use. Access Code: [email or web address] URL: [email or web address] Date: 08/07/2023 Prepared by: Temple Pacini  Exercises - Seated Elbow Extension with Self-Anchored Resistance  - 1 x daily - 5-7 x weekly - 3 sets - 10-12 reps - Seated Shoulder Shrug Circles AROM Backward  - 1 x daily - 5-7 x weekly - 1 sets - 10 reps - Seated Shoulder Shrug Circles AROM Forward  - 1 x daily - 5-7 x weekly - 1 sets - 10 reps - Seated Scapular Retraction  - 1 x daily - 5-7 x weekly - 2 sets - 10 reps - Seated Shoulder Flexion  Towel Slide at Table Top  - 1 x daily - 5-7 x weekly - 2-3 sets - 10 reps  PT provides review of assessment findings, impact on goals, plan, expected progression   PATIENT EDUCATION:  Education details: assessment findings, plan, HEP, goals Person educated: Patient Education method: Explanation, Demonstration, Verbal cues, and Handouts Education comprehension: verbalized understanding and returned demonstration  HOME EXERCISE PROGRAM: Access Code: [email or web address] URL: [email or web address] Date: 08/07/2023 Prepared by: Temple Pacini  Exercises - Seated Elbow Extension with Self-Anchored Resistance  - 1 x daily - 5-7 x weekly - 3 sets - 10-12 reps - Seated Shoulder Shrug Circles AROM Backward  - 1 x daily - 5-7 x weekly - 1 sets - 10 reps - Seated Shoulder Shrug Circles AROM Forward  - 1 x daily - 5-7 x weekly - 1 sets - 10 reps - Seated Scapular Retraction  - 1 x daily - 5-7 x weekly - 2 sets - 10 reps - Seated Shoulder Flexion Towel Slide at Table Top  - 1 x daily - 5-7 x weekly - 2-3 sets - 10 reps   ASSESSMENT:  CLINICAL IMPRESSION: Patient is a pleasant 33 y.o. female who was seen  today for physical therapy evaluation s/p bilateral mastectomy and bilateral breast reconstruction 04/2023.  Exam findings reveal deficits in BUE strength, ROM, RUE swelling & pain. Greater deficits found RUE>LUE, where pt's primary concern is RUE. Pt is R hand dominant and current impairments are impacting her ability to complete ADLs, participate in work activities. The pt will benefit from further skilled PT to address impairments in order to decrease pain, and improve strength, ROM, ADL ability and QOL to return pt to PLOF.   OBJECTIVE IMPAIRMENTS: decreased mobility, decreased ROM, decreased strength, hypomobility, increased edema, increased fascial restrictions, impaired flexibility, impaired sensation, impaired UE functional use, and pain.   ACTIVITY LIMITATIONS: carrying, lifting, sleeping, bathing, dressing, and reach over head  PARTICIPATION LIMITATIONS: meal prep, cleaning, shopping, community activity, occupation, and yard work  PERSONAL FACTORS: Sex and 1-2 comorbidities: depression, anxiety  are also affecting patient's functional outcome.   REHAB POTENTIAL: Good  CLINICAL DECISION MAKING: Evolving/moderate complexity  EVALUATION COMPLEXITY: Moderate   GOALS: Goals reviewed with patient? Yes  SHORT TERM GOALS: Target date: 09/18/2023    Patient will be independent in home exercise program to improve strength/mobility for better functional independence with ADLs. Baseline: initiated Goal status: INITIAL   LONG TERM GOALS: Target date: 10/30/2023   Patient will increase BUE gross strength to 5/5 as to improve functional strength for ADL ability. Baseline: Gross RUE is 4/5; Gross LUE strength is 4+/5 Goal status: INITIAL  2.  The pt will report a worst RUE pain of no greater than 2/10 over the past 2 weeks to indicate increased QOL. Baseline:  4-5/10 Goal status: INITIAL  3.  Patient will improve RUE shoulder AROM to > 140 degrees of pain-free flexion and abduction  for improved ability to perform overhead activities. Baseline: flexion 135 deg and pain-limited, abduction 119 deg and pain-limited  Goal status: INITIAL  4.   Patient will decrease Quick DASH score by > 8 points demonstrating reduced self-reported upper extremity disability. Baseline: 34.1 Goal status: INITIAL   PLAN:  PT FREQUENCY: 1-2x/week  PT DURATION: 12 weeks  PLANNED INTERVENTIONS: 97164- PT Re-evaluation, 97110-Therapeutic exercises, 97530- Therapeutic activity, [id number]- Neuromuscular re-education, 97535- Self Care, [id number]- Manual therapy, [id number]- Gait training, [phone]- Orthotic Fit/training, [phone]- Canalith repositioning, [id number]- Splinting, [phone]- Electrical stimulation (manual), Patient/Family education, Balance training,  Stair training, Taping, Dry Needling, Joint mobilization, Spinal mobilization, Scar mobilization, DME instructions, Cryotherapy, and Moist heat.  PLAN FOR NEXT SESSION: issue theraputty, manual, PROM, strength   Baird Kay, PT 08/08/2023, 11:11 AM

## 2023-08-09 ENCOUNTER — Other Ambulatory Visit: Payer: Self-pay

## 2023-08-11 ENCOUNTER — Encounter: Payer: Self-pay | Admitting: Oncology

## 2023-08-14 ENCOUNTER — Telehealth: Payer: Self-pay | Admitting: *Deleted

## 2023-08-14 ENCOUNTER — Ambulatory Visit: Payer: Medicaid Other

## 2023-08-14 NOTE — Telephone Encounter (Addendum)
 Received on (08/07/2023) via of fax Outpatient Occupational Therapy Lymphedema Referral from [facility]  Requesting to be completed and faxed back.    Outpatient Occupational Therapy Lymphedema Referral completed and faxed back to Outpatient Occupational Therapy @ [facility].  Confirmation received and copy scanned into the chart.//AB/CMA

## 2023-08-15 NOTE — Therapy (Signed)
 OUTPATIENT PHYSICAL THERAPY EVALUATION   Patient Name: Eileen Hart MRN: [id number] DOB:Apr 24, 1991, 33 y.o., female Today's Date: 08/18/2023  END OF SESSION:   PT End of Session - 08/18/23 0933     Visit Number 2    Number of Visits 25    Date for PT Re-Evaluation 10/30/23    PT Start Time 0933    PT Stop Time 1018    PT Time Calculation (min) 45 min    Activity Tolerance Patient tolerated treatment well    Behavior During Therapy [facility] for tasks assessed/performed              Past Medical History:  Diagnosis Date   Chlamydia 01/25/2010   Depression with anxiety    Gestational hypertension    Immunization, viral disease    gardasil series completed   Invasive carcinoma of breast (HCC)    Tobacco abuse 05/02/2016   Past Surgical History:  Procedure Laterality Date   AUGMENTATION MAMMAPLASTY Bilateral    saline implants 2015   BREAST BIOPSY Right 10/14/2022   US biopsy/ coil clip/ path pending   BREAST BIOPSY Right 10/14/2022   [place] RT BREAST BX W LOC DEV 1ST LESION IMG BX SPEC US GUIDE 10/14/2022 [facility]   BREAST BIOPSY Left 10/29/2022   [place] Core Bx Ribbon clip- path pending   BREAST BIOPSY Right 10/29/2022   [place] Core 1130 Venus Clip- path pending   BREAST BIOPSY Right 10/29/2022   [place] Core Bx Retroareolar heart clip-path pending   BREAST BIOPSY Right 10/29/2022   [place] Core Bx Ribbon Clip path pending   BREAST BIOPSY Right 10/29/2022   [place] RT BREAST BX W LOC DEV EA ADD LESION IMG BX SPEC US GUIDE 10/29/2022 [facility]   BREAST BIOPSY Right 10/29/2022   [place] RT BREAST BX W LOC DEV 1ST LESION IMG BX SPEC US GUIDE 10/29/2022 [facility]   BREAST BIOPSY Right 10/29/2022   [place] RT BREAST BX W LOC DEV EA ADD LESION IMG BX SPEC US GUIDE 10/29/2022 [facility]   BREAST BIOPSY Left 10/29/2022   [place] LT BREAST BX W LOC DEV 1ST LESION IMG BX SPEC US GUIDE 10/29/2022 [facility]   BREAST ENHANCEMENT SURGERY  2015   BREAST RECONSTRUCTION WITH PLACEMENT OF TISSUE  EXPANDER AND FLEX HD (ACELLULAR HYDRATED DERMIS) Bilateral 05/08/2023   Procedure: Bilateral immediate breast reconstruction with expanders and Flex HD placement;  Surgeon: Peggye Form, DO;  Location: [facility];  Service: Plastics;  Laterality: Bilateral;   CESAREAN SECTION  2014   MASTECTOMY W/ SENTINEL NODE BIOPSY Bilateral 05/08/2023   Procedure: MASTECTOMY Simple WITH SENTINEL LYMPH NODE BIOPSY, RNFA to assist;  Surgeon: Leafy Ro, MD;  Location: [facility];  Service: General;  Laterality: Bilateral;   PORTACATH PLACEMENT N/A 10/24/2022   Procedure: INSERTION PORT-A-CATH;  Surgeon: Leafy Ro, MD;  Location: [facility];  Service: General;  Laterality: N/A;   TONSILLECTOMY     Patient Active Problem List   Diagnosis Date Noted   S/P bilateral mastectomy 05/08/2023   Genetic testing 10/30/2022   Invasive carcinoma of breast (HCC) 10/22/2022   Malignant neoplasm of upper-outer quadrant of right breast in female, estrogen receptor negative (HCC) 10/22/2022   Lump of right breast 09/18/2022   Weight gain 12/04/2020   Right wrist pain 09/27/2019   Contraceptive management 07/07/2019   Anxiety and depression 12/29/2015    PCP: Glori Luis, MD  REFERRING PROVIDER: Scheeler, Kermit Balo, PA-C   REFERRING DIAG:  Diagnosis  C50.411,Z17.1 (  ICD-10-CM) - Malignant neoplasm of upper-outer quadrant of right breast in female, estrogen receptor negative (HCC)  C50.919 (ICD-10-CM) - Invasive carcinoma of breast (HCC)  Z98.890 (ICD-10-CM) - S/P breast reconstruction   Referral note: "Recovery after bilateral mastectomy and bilateral breast reconstruction 04/2023. Has expanders in place. Patient is noticing restricted ROM. Need eval for possible lymphedema as well.   Thank you all for your help."   Rationale for Evaluation and Treatment: Rehabilitation  THERAPY DIAG:  Stiffness of right shoulder, not elsewhere classified  Stiffness of right elbow, not elsewhere  classified  Muscle weakness (generalized)  Pain in right arm  ONSET DATE: 04/2023   SUBJECTIVE:                                                                                                                                                                                           SUBJECTIVE STATEMENT:   Pt reports the discomfort starts in R arm near distal axillary region, states it isn't as tight as it was at initial PT eval. Pt reports she has been doing HEP, and will stop if the exercises start to make that area more tight. Reports trying to do her HEP as consistently as possible, but her son was sick last week and so it disrupted her schedule.  Pt reports she had a massage the day before her initial PT eval and that ever since then the pain in her R arm has slowly improved each day; however, she still has the greatest pain when going to fully extend and reach her arm in any direction. States overall she has less of that specific pulling pain in a line down her arm.    Initial Eval: The pt is a pleasant 33 y/o female, hx of breast CA, presenting to PT s/p bilateral mastectomy with bilateral breast reconstruction 05/08/2023 with expanders in place. Pt would like to address residual RUE pain, decreased ROM and strength. Pt reports pain travels from R armpit to wrist. She has difficulty fully straightening her R arm, which is impacting her ability to reach, dress, and push or pull with this arm. She is R handed. Pt also notes tightness and pressure in her R shoulder. Her strength is not what it used to be. She experiences tingling/numbness in RUE after using her phone for a while. 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 Pt reports no current activity restrictions she is aware of, but is not lifting items over 40#. She still has expanders in. Currently on chemo pill. Additionally, reports hx of R wrist tendon pain years ago, feels it every  once in a while. 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 Pt has 75 y/o son.    PERTINENT HISTORY:   Pt underwent bilateral mastectomy and bilateral breast reconstruction 05/08/2023. Pt has expanders in place.  Per oncology "Patient...who underwent a bilateral diagnostic mammogram on 10/07/2022 after she felt a palpable area of concern in her right breast. Mammogram showed a 1.6 x 1 x 1.5 cm solid mass in the right breast 9:30 position 5 cm from the nipple. There was also an adjacent 6 x 9 x 2 mm mass in the right breast. No right axillary adenopathy. The dominant mass was biopsied and was consistent with invasive mammary carcinoma grade 3 ER and HER2 negative. "  Per MD note on 07/22/2023: "She had bilateral tissue expanders placed on 05/08/2023 with Dr. Ulice Bold. Of note, expanders were placed in the subpectoral plane. We have discussed that she will not be able to have expander to implant exchange until late June early July due to current chemotherapy regimen.Marland KitchenMarland KitchenMarland KitchenOn exam bilateral breast incisions are intact, well-healed.Marland KitchenMarland KitchenMarland KitchenBilateral tissue expanders are in place, no subcutaneous fluid collection noted palpation."  Other PMH per chart consists of tobacco use, depression with anxiety  PAIN:  Are you having pain?  Avg pain: 2-4/10 worst: 4-5/10; at rest it can be 0/10 Describes pain as sharp & tight, "it's almost like there's a cord in my arm"   PRECAUTIONS:  Pt reports no current activity restrictions she is aware of, but is not lifting items over 40#.  RED FLAGS: Pt currently being treated for breast CA, no other red flags per report     WEIGHT BEARING RESTRICTIONS:  No  FALLS:  Has patient fallen in last 6 months? No  LIVING ENVIRONMENT: Lives with: lives with their son 30 y/o  Lives in: House/apartment  OCCUPATION:   [employer], has not been able to fully return to work   PLOF:  Independent  PATIENT GOALS:  Improve RUE strength, ROM, function  NEXT MD VISIT:     OBJECTIVE:  Note: Objective measures were completed at Evaluation unless otherwise noted.  DIAGNOSTIC FINDINGS:  Please refer to chart for past imaging reports PATIENT SURVEYS:  Quick Dash 34.1  COGNITIVE STATUS: Within functional limits for tasks assessed   SENSATION: WNL UE   Coordination: Rapid alt UE is WNL   EDEMA: OT lymphedema referral request being placed to address swelling of RUE    POSTURE:  No Significant postural limitations  HAND DOMINANCE:  Right  Body Part #1 Shoulder  PALPATION: TrP noted R UT, none currently, but pt reports she tends to be TTP near R axilla   MMT (strength testing): Gross RUE is 4/5 Gross LUE strength is 4+/5 Grip strength 4+/5 bilat   AROM BUE: Shoulder Flexion: RUE: 135 deg and pain-limited LUE: 150 deg  Shoulder abduction: RUE: 119 deg and pain-limited Left: 137 deg  Shoulder ER:  RUE: 65 deg LUE: 60 deg - slight tightness but no pain   Shoulder IR: no formal measurement taken but observed WNL bilat  Elbow ROM: Flexion WNL bilat  Extension:  Lacking 10 deg RUE Able to achieve 0 deg LUE  TREATMENT DATE: 08/18/2023  Pt with full elbow extension AROM today. Pt also with full shoulder flexion and abduction AROM noted visually. Pt does report tightness on L lateral border of scapula of L UE>R UE with overhead reaching. Pt with significant improvement in her pain today compared to at initial eval.  Soft tissue mobilization of biceps and no noticeable palpable tendenerness nor trigger points.  Quickly assessed upper limb neural tension of ulnar nerve based on pt's line of discomfort and it was negative.   Scapular mobility slightly discoordinated on R compared to L during overhead reaching, but difficult to so slight it is difficult to determine.   Performed the below B UE strengthening  exercises focused on shoulder stability with progression to more advanced reaching activities with ability to stabilize the arm to avoid the pain experienced with full arm extended reaching.  - Shoulder External Rotation with level 2 red theraband resistance 2x 15 reps Standing Shoulder Internal Rotation with Anchored level 2 red theraband resistance  2x 15 reps - Standing scapular Retraction with level 2 red theraband resistance 2 x15 reps - Standing bicep curls 3lb weight x15 reps each side - Lateral side stepping with arm extended reaching to pick up 1lb dumbell x10rep - Standing shoulder flexion in scapular plan x8reps with 2lb weight in each hand *Pt with greatest difficult controlling eccentric movements of the above exercises (specifically shoulder ER and IR) with cuing for proper form/technique * Pt reports feeling muscle burn during the above exercises due to weakness (specifically L UE during bicep curls and both UEs during shoulder flexion)  HEP updated below and provided printout to patient.  Pt reports she can feel where she exercised her muscles today.  Pt with scheduled PT eval with lymph specialist on 3/3.  Pt denies increased pain during exercises today.   PATIENT EDUCATION:  Education details: assessment findings, plan, HEP, goals Person educated: Patient Education method: Explanation, Demonstration, Verbal cues, and Handouts Education comprehension: verbalized understanding and returned demonstration  HOME EXERCISE PROGRAM: Access Code: [email or web address] URL: [email or web address] Date: 08/18/2023 Prepared by: Casimiro Needle  Exercises - Seated Elbow Extension with Self-Anchored Resistance  - 1 x daily - 5-7 x weekly - 2 sets - 15 reps - Shoulder External Rotation with Resistance  - 1 x daily - 7 x weekly - 2 sets - 15 reps - Standing Shoulder Internal Rotation with Anchored Resistance  - 1 x daily - 7 x weekly - 2 sets - 15 reps - Scapular Retraction with  Resistance  - 1 x daily - 7 x weekly - 2 sets - 15 reps   ASSESSMENT:  CLINICAL IMPRESSION:  Patient is a pleasant 33 y.o. female who was seen today for physical therapy treatment s/p bilateral mastectomy and bilateral breast reconstruction 04/2023. Patient continues to demonstrate significant B UE strength deficits; however, pt has significant improvement in her R UE pain and AROM today allowing increased participation in strengthening exercises. Pt tolerated gentle progression of B UE strengthening focused on scapular stabilizer muscles with progression to full arm extended reaching/strengthening exercises because this is still the movement that causes the most noticeable pain/discomfort. 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 The pt will benefit from further skilled PT to address impairments in order to decrease pain, and improve strength, ROM, ADL ability and QOL to return pt to PLOF.   OBJECTIVE IMPAIRMENTS:  decreased mobility, decreased ROM, decreased strength, hypomobility, increased edema, increased fascial restrictions, impaired flexibility, impaired sensation, impaired UE functional use, and pain.   ACTIVITY LIMITATIONS: carrying, lifting, sleeping, bathing, dressing, and reach over head  PARTICIPATION LIMITATIONS: meal prep, cleaning, shopping, community activity, occupation, and yard work  PERSONAL FACTORS: Sex and 1-2 comorbidities: depression, anxiety  are also affecting patient's functional outcome.   REHAB POTENTIAL: Good  CLINICAL DECISION MAKING: Evolving/moderate complexity  EVALUATION COMPLEXITY: Moderate   GOALS: Goals reviewed with patient? Yes  SHORT TERM GOALS: Target date: 09/18/2023    Patient will be independent in home exercise program to improve strength/mobility for better functional independence with ADLs. Baseline:  initiated Goal status: INITIAL   LONG TERM GOALS: Target date: 10/30/2023   Patient will increase BUE gross strength to 5/5 as to improve functional strength for ADL ability. Baseline: Gross RUE is 4/5; Gross LUE strength is 4+/5 Goal status: INITIAL  2.  The pt will report a worst RUE pain of no greater than 2/10 over the past 2 weeks to indicate increased QOL. Baseline:  4-5/10 Goal status: INITIAL  3.  Patient will improve RUE shoulder AROM to > 140 degrees of pain-free flexion and abduction for improved ability to perform overhead activities. Baseline: flexion 135 deg and pain-limited, abduction 119 deg and pain-limited  Goal status: INITIAL  4.   Patient will decrease Quick DASH score by > 8 points demonstrating reduced self-reported upper extremity disability. Baseline: 34.1 Goal status: INITIAL   PLAN:  PT FREQUENCY: 1-2x/week  PT DURATION: 12 weeks  PLANNED INTERVENTIONS: 97164- PT Re-evaluation, 97110-Therapeutic exercises, 97530- Therapeutic activity, [id number]- Neuromuscular re-education, 97535- Self Care, [id number]- Manual therapy, [id number]- Gait training, [phone]- Orthotic Fit/training, [phone]- Canalith repositioning, [id number]- Splinting, [phone]- Electrical stimulation (manual), Patient/Family education, Balance training, Stair training, Taping, Dry Needling, Joint mobilization, Spinal mobilization, Scar mobilization, DME instructions, Cryotherapy, and Moist heat.  PLAN FOR NEXT SESSION:  - progressive B UE strength training within pt's tolerance  - focus on progressing to strengthening exercises with arm extended positioning     Abryana Lykens, PT, DPT, NCS, CSRS Physical Therapist - [facility]  [facility]  10:43 AM 08/18/23

## 2023-08-18 ENCOUNTER — Ambulatory Visit: Payer: Medicaid Other | Admitting: Physical Therapy

## 2023-08-18 ENCOUNTER — Encounter: Payer: Self-pay | Admitting: Oncology

## 2023-08-18 ENCOUNTER — Inpatient Hospital Stay: Payer: Medicaid Other | Attending: Oncology

## 2023-08-18 ENCOUNTER — Inpatient Hospital Stay (HOSPITAL_BASED_OUTPATIENT_CLINIC_OR_DEPARTMENT_OTHER): Payer: Medicaid Other | Admitting: Oncology

## 2023-08-18 VITALS — BP 109/83 | HR 79 | Temp 97.6°F | Resp 18

## 2023-08-18 DIAGNOSIS — M25621 Stiffness of right elbow, not elsewhere classified: Secondary | ICD-10-CM

## 2023-08-18 DIAGNOSIS — M25611 Stiffness of right shoulder, not elsewhere classified: Secondary | ICD-10-CM

## 2023-08-18 DIAGNOSIS — Z87891 Personal history of nicotine dependence: Secondary | ICD-10-CM | POA: Insufficient documentation

## 2023-08-18 DIAGNOSIS — C50811 Malignant neoplasm of overlapping sites of right female breast: Secondary | ICD-10-CM | POA: Diagnosis present

## 2023-08-18 DIAGNOSIS — Z171 Estrogen receptor negative status [ER-]: Secondary | ICD-10-CM | POA: Insufficient documentation

## 2023-08-18 DIAGNOSIS — L271 Localized skin eruption due to drugs and medicaments taken internally: Secondary | ICD-10-CM | POA: Diagnosis not present

## 2023-08-18 DIAGNOSIS — Z79899 Other long term (current) drug therapy: Secondary | ICD-10-CM

## 2023-08-18 DIAGNOSIS — M6281 Muscle weakness (generalized): Secondary | ICD-10-CM

## 2023-08-18 DIAGNOSIS — M79601 Pain in right arm: Secondary | ICD-10-CM

## 2023-08-18 DIAGNOSIS — C50411 Malignant neoplasm of upper-outer quadrant of right female breast: Secondary | ICD-10-CM | POA: Diagnosis not present

## 2023-08-18 LAB — COMPREHENSIVE METABOLIC PANEL
ALT: 12 U/L (ref 0–44)
AST: 20 U/L (ref 15–41)
Albumin: 5.1 g/dL — ABNORMAL HIGH (ref 3.5–5.0)
Alkaline Phosphatase: 62 U/L (ref 38–126)
Anion gap: 14 (ref 5–15)
BUN: 11 mg/dL (ref 6–20)
CO2: 21 mmol/L — ABNORMAL LOW (ref 22–32)
Calcium: 10 mg/dL (ref 8.9–10.3)
Chloride: 102 mmol/L (ref 98–111)
Creatinine, Ser: 0.83 mg/dL (ref 0.44–1.00)
GFR, Estimated: >60 mL/min (ref >60)
Glucose, Bld: 98 mg/dL (ref 70–99)
Potassium: 4.6 mmol/L (ref 3.5–5.1)
Sodium: 137 mmol/L (ref 135–145)
Total Bilirubin: 1.3 mg/dL — ABNORMAL HIGH (ref 0.0–1.2)
Total Protein: 8.5 g/dL — ABNORMAL HIGH (ref 6.5–8.1)

## 2023-08-18 LAB — CBC WITH DIFFERENTIAL (CANCER CENTER ONLY)
Abs Immature Granulocytes: 0.01 10*3/uL (ref 0.00–0.07)
Basophils Absolute: 0 10*3/uL (ref 0.0–0.1)
Basophils Relative: 1 %
Eosinophils Absolute: 0.1 10*3/uL (ref 0.0–0.5)
Eosinophils Relative: 2 %
HCT: 43.3 % (ref 36.0–46.0)
Hemoglobin: 14.7 g/dL (ref 12.0–15.0)
Immature Granulocytes: 0 %
Lymphocytes Relative: 35 %
Lymphs Abs: 1.4 10*3/uL (ref 0.7–4.0)
MCH: 31.8 pg (ref 26.0–34.0)
MCHC: 33.9 g/dL (ref 30.0–36.0)
MCV: 93.7 fL (ref 80.0–100.0)
Monocytes Absolute: 0.3 10*3/uL (ref 0.1–1.0)
Monocytes Relative: 7 %
Neutro Abs: 2.3 10*3/uL (ref 1.7–7.7)
Neutrophils Relative %: 55 %
Platelet Count: 182 10*3/uL (ref 150–400)
RBC: 4.62 MIL/uL (ref 3.87–5.11)
RDW: 17.7 % — ABNORMAL HIGH (ref 11.5–15.5)
WBC Count: 4.1 10*3/uL (ref 4.0–10.5)
nRBC: 0 % (ref 0.0–0.2)

## 2023-08-18 NOTE — Progress Notes (Signed)
 Hematology/Oncology Consult note [facility]  [phone] [phone]  Patient Care Team: Glori Luis, MD as PCP - General (Family Medicine) Hulen Luster, RN as Oncology Nurse Navigator Creig Hines, MD as Consulting Physician (Oncology) Leafy Ro, MD as Consulting Physician (General Surgery) Dillingham, Alena Bills, DO as Attending Physician (Plastic Surgery)   Name of the patient: Eileen Hart  [id number]  1990-07-31   Date of visit: 08/18/23  Diagnosis- clinical prognostic stage Ib invasive mammary carcinoma of the right breast cT1 cN0 M0 triple negative     Chief complaint/ Reason for visit-on treatment assessment prior to cycle 4 of Xeloda  Heme/Onc history: Patient is a 33 year old female who underwent a bilateral diagnostic mammogram on 10/07/2022 after she felt a palpable area of concern in her right breast. Mammogram showed a 1.6 x 1 x 1.5 cm solid mass in the right breast 9:30 position 5 cm from the nipple. There was also an adjacent 6 x 9 x 2 mm mass in the right breast. No right axillary adenopathy. The dominant mass was biopsied and was consistent with invasive mammary carcinoma grade 3 ER and HER2 negative.    Bilateral breast MRI showed additional areas of concern in the right breast.  2 additional enhancing masses 1 in the 12 o'clock position and 1 in the 4 o'clock position.  2 adjacent prominent right axillary lymph nodes.  2 intermittent masses in the left breast 9 o'clock position and upper outer left breast.  This was followed by a dedicated ultrasound.  She had 3 breast biopsies in the right side and 1 left breast biopsy and all of them were negative for malignancy.  On the ultrasound her right axillary lymph nodes appeared normal and therefore biopsy was not recommended for the same.   PET CT scan showed focal hypermetabolic activity in the right breast adjacent to the biopsy clip but no other additional areas of  concern.  Baseline MUGA scan showed normal EF.   Given that additional biopsies were negative for malignancy and the only biopsy-proven site was 1.6 cm triple negative breast cancer with negative lymph nodes plan was to do [facility] Taxol chemotherapy neoadjuvant without opting for keynote 522 regimen.    Mild decrease in the size of primary breast mass after 4 cycles of AC chemotherapy. Carboplatin added to taxol neoadjuvantly   Patient underwent bilateral mastectomy with reconstruction on 05/08/2023.No malignancy noted in the left breast. She was found to have 0.6 cm invasive ductal carcinoma high-grade in the right breast. There was another area of fibromatoid change noted adjacent to it and the intervening parenchyma between these 2 areas showed an additional 0.4 cm invasive ductal carcinoma 2 sentinel lymph nodes negative for malignancy. No definitive response to presurgical therapy in the invasive carcinoma. Overall cancer cellularity 95%. ypT1b N0.     Interval history-patient is tolerating Xeloda well.  She does have some mild redness in her hands along with dry skin for which she is using topical urea cream.  Denies any significant neuropathy in her extremities.  No nausea vomiting or diarrhea  ECOG PS- 0 Pain scale- 0   Review of systems- Review of Systems  Constitutional:  Negative for chills, fever, malaise/fatigue and weight loss.  HENT:  Negative for congestion, ear discharge and nosebleeds.   Eyes:  Negative for blurred vision.  Respiratory:  Negative for cough, hemoptysis, sputum production, shortness of breath and wheezing.   Cardiovascular:  Negative for chest  pain, palpitations, orthopnea and claudication.  Gastrointestinal:  Negative for abdominal pain, blood in stool, constipation, diarrhea, heartburn, melena, nausea and vomiting.  Genitourinary:  Negative for dysuria, flank pain, frequency, hematuria and urgency.  Musculoskeletal:  Negative for back pain, joint pain and  myalgias.  Skin:  Negative for rash.  Neurological:  Negative for dizziness, tingling, focal weakness, seizures, weakness and headaches.  Endo/Heme/Allergies:  Does not bruise/bleed easily.  Psychiatric/Behavioral:  Negative for depression and suicidal ideas. The patient does not have insomnia.       No Known Allergies   Past Medical History:  Diagnosis Date   Chlamydia 01/25/2010   Depression with anxiety    Gestational hypertension    Immunization, viral disease    gardasil series completed   Invasive carcinoma of breast (HCC)    Tobacco abuse 05/02/2016     Past Surgical History:  Procedure Laterality Date   AUGMENTATION MAMMAPLASTY Bilateral    saline implants 2015   BREAST BIOPSY Right 10/14/2022   US biopsy/ coil clip/ path pending   BREAST BIOPSY Right 10/14/2022   [place] RT BREAST BX W LOC DEV 1ST LESION IMG BX SPEC US GUIDE 10/14/2022 [facility]   BREAST BIOPSY Left 10/29/2022   [place] Core Bx Ribbon clip- path pending   BREAST BIOPSY Right 10/29/2022   [place] Core 1130 Venus Clip- path pending   BREAST BIOPSY Right 10/29/2022   [place] Core Bx Retroareolar heart clip-path pending   BREAST BIOPSY Right 10/29/2022   [place] Core Bx Ribbon Clip path pending   BREAST BIOPSY Right 10/29/2022   [place] RT BREAST BX W LOC DEV EA ADD LESION IMG BX SPEC US GUIDE 10/29/2022 [facility]   BREAST BIOPSY Right 10/29/2022   [place] RT BREAST BX W LOC DEV 1ST LESION IMG BX SPEC US GUIDE 10/29/2022 [facility]   BREAST BIOPSY Right 10/29/2022   [place] RT BREAST BX W LOC DEV EA ADD LESION IMG BX SPEC US GUIDE 10/29/2022 [facility]   BREAST BIOPSY Left 10/29/2022   [place] LT BREAST BX W LOC DEV 1ST LESION IMG BX SPEC US GUIDE 10/29/2022 [facility]   BREAST ENHANCEMENT SURGERY  2015   BREAST RECONSTRUCTION WITH PLACEMENT OF TISSUE EXPANDER AND FLEX HD (ACELLULAR HYDRATED DERMIS) Bilateral 05/08/2023   Procedure: Bilateral immediate breast reconstruction with expanders and Flex HD placement;  Surgeon:  Peggye Form, DO;  Location: [facility];  Service: Plastics;  Laterality: Bilateral;   CESAREAN SECTION  2014   MASTECTOMY W/ SENTINEL NODE BIOPSY Bilateral 05/08/2023   Procedure: MASTECTOMY Simple WITH SENTINEL LYMPH NODE BIOPSY, RNFA to assist;  Surgeon: Leafy Ro, MD;  Location: [facility];  Service: General;  Laterality: Bilateral;   PORTACATH PLACEMENT N/A 10/24/2022   Procedure: INSERTION PORT-A-CATH;  Surgeon: Leafy Ro, MD;  Location: [facility];  Service: General;  Laterality: N/A;   TONSILLECTOMY      Social History   Socioeconomic History   Marital status: Single    Spouse name: Not on file   Number of children: 1   Years of education: 15   Highest education level: Not on file  Occupational History   Occupation: Groomer    Comment: [employer]  Tobacco Use   Smoking status: Former    Current packs/day: 0.00    Types: Cigarettes    Quit date: 07/25/2018    Years since quitting: 5.0    Passive exposure: Past   Smokeless tobacco: Never   Tobacco comments:    quit 07/2018  Vaping  Use   Vaping status: Some Days  Substance and Sexual Activity   Alcohol use: No    Alcohol/week: 0.0 standard drinks of alcohol   Drug use: No   Sexual activity: Yes    Birth control/protection: Pill  Other Topics Concern   Not on file  Social History Narrative   Not on file   Social Drivers of Health   Financial Resource Strain: Not on file  Food Insecurity: No Food Insecurity (05/08/2023)   Hunger Vital Sign    Worried About Running Out of Food in the Last Year: Never true    Ran Out of Food in the Last Year: Never true  Transportation Needs: No Transportation Needs (05/08/2023)   PRAPARE - Administrator, Civil Service (Medical): No    Lack of Transportation (Non-Medical): No  Physical Activity: Not on file  Stress: Not on file  Social Connections: Not on file  Intimate Partner Violence: Not At Risk (05/08/2023)   Humiliation, Afraid, Rape, and  Kick questionnaire    Fear of Current or Ex-Partner: No    Emotionally Abused: No    Physically Abused: No    Sexually Abused: No    Family History  Problem Relation Age of Onset   Breast cancer Other 35       mat 2nd cousin   Diabetes Neg Hx    Heart disease Neg Hx    Hypertension Neg Hx    Ovarian cancer Neg Hx    Colon cancer Neg Hx      Current Outpatient Medications:    acetaminophen (TYLENOL) 500 MG tablet, Take 1,000 mg by mouth every 6 (six) hours as needed for moderate pain (pain score 4-6)., Disp: , Rfl:    capecitabine (XELODA) 500 MG tablet, Take 3 tablets (1,500 mg total) by mouth 2 (two) times daily after a meal. Take for 14 days, then hold for 7 days. Repeat every 21 days., Disp: 84 tablet, Rfl: 1   diazepam (VALIUM) 2 MG tablet, Take 1 tablet (2 mg total) by mouth every 12 (twelve) hours as needed for muscle spasms., Disp: 20 tablet, Rfl: 0   diazepam (VALIUM) 2 MG tablet, Take 1 tablet (2 mg total) by mouth every 12 (twelve) hours as needed for muscle spasms., Disp: 20 tablet, Rfl: 0   diclofenac Sodium (VOLTAREN) 1 % GEL, Research Patient: Apply 0.5 grams (1 fingertip) to each hand and each foot twice daily for up to 12 weeks, Disp: 400 g, Rfl: 0   gabapentin (NEURONTIN) 300 MG capsule, Take 1 capsule (300 mg total) by mouth 3 (three) times daily., Disp: 90 capsule, Rfl: 3   ibuprofen (ADVIL) 200 MG tablet, Take 600-800 mg by mouth every 6 (six) hours as needed for moderate pain (pain score 4-6)., Disp: , Rfl:    ondansetron (ZOFRAN) 4 MG tablet, Take 1 tablet (4 mg total) by mouth every 8 (eight) hours as needed for nausea or vomiting., Disp: 60 tablet, Rfl: 2  Physical exam:  Vitals:   08/18/23 1049  BP: 109/83  Pulse: 79  Resp: 18  Temp: 97.6 F (36.4 C)  TempSrc: Tympanic  SpO2: 99%   Physical Exam Cardiovascular:     Rate and Rhythm: Normal rate and regular rhythm.     Heart sounds: Normal heart sounds.  Pulmonary:     Effort: Pulmonary effort is  normal.     Breath sounds: Normal breath sounds.  Skin:    General: Skin is warm and dry.  Neurological:     Mental Status: She is alert and oriented to person, place, and time.         Latest Ref Rng & Units 08/18/2023   10:32 AM  CMP  Glucose 70 - 99 mg/dL 98   BUN 6 - 20 mg/dL 11   Creatinine 4.09 - 1.00 mg/dL 8.11   Sodium 914 - 782 mmol/L 137   Potassium 3.5 - 5.1 mmol/L 4.6   Chloride 98 - 111 mmol/L 102   CO2 22 - 32 mmol/L 21   Calcium 8.9 - 10.3 mg/dL 95.6   Total Protein 6.5 - 8.1 g/dL 8.5   Total Bilirubin 0.0 - 1.2 mg/dL 1.3   Alkaline Phos 38 - 126 U/L 62   AST 15 - 41 U/L 20   ALT 0 - 44 U/L 12       Latest Ref Rng & Units 08/18/2023   10:32 AM  CBC  WBC 4.0 - 10.5 K/uL 4.1   Hemoglobin 12.0 - 15.0 g/dL 21.3   Hematocrit 08.6 - 46.0 % 43.3   Platelets 150 - 400 K/uL 182      Assessment and plan- Patient is a 33 y.o. female with history of stage Ib invasive mammary carcinoma of the right breast cT1 cN0 M0 triple negative status post neoadjuvant AC CarboTaxol chemotherapy. She had 2 foci of residual disease 0.6 and 0.4 cm after neoadjuvant chemotherapy.  She is here for on treatment assessment prior to cycle 4 of adjuvant Xeloda  White cell count is normal today with an ANC of greater than 1.  She can proceed with cycle 4 of Xeloda on 08/20/2023.  She is taking it 2 weeks on and 1 week off.  CBC with differential in 3 weeks in 6 weeks and I will see her back in 6 weeks prior to start of cycle 6.  Plan is to complete 8 adjuvant cycles.   Visit Diagnosis 1. Malignant neoplasm of upper-outer quadrant of right breast in female, estrogen receptor negative (HCC)   2. High risk medication use   3. Palmar plantar erythrodysaesthesia      Dr. Owens Shark, MD, MPH [facility] [phone] 08/18/2023 2:25 PM

## 2023-08-19 ENCOUNTER — Other Ambulatory Visit: Payer: Self-pay

## 2023-08-20 ENCOUNTER — Encounter: Payer: Self-pay | Admitting: Surgery

## 2023-08-20 ENCOUNTER — Ambulatory Visit (INDEPENDENT_AMBULATORY_CARE_PROVIDER_SITE_OTHER): Payer: Medicaid Other | Admitting: Surgery

## 2023-08-20 ENCOUNTER — Ambulatory Visit: Payer: Medicaid Other

## 2023-08-20 VITALS — BP 119/79 | HR 73 | Temp 98.9°F | Ht 63.0 in | Wt 111.8 lb

## 2023-08-20 DIAGNOSIS — M25611 Stiffness of right shoulder, not elsewhere classified: Secondary | ICD-10-CM

## 2023-08-20 DIAGNOSIS — Z09 Encounter for follow-up examination after completed treatment for conditions other than malignant neoplasm: Secondary | ICD-10-CM

## 2023-08-20 DIAGNOSIS — M79601 Pain in right arm: Secondary | ICD-10-CM

## 2023-08-20 DIAGNOSIS — C50411 Malignant neoplasm of upper-outer quadrant of right female breast: Secondary | ICD-10-CM | POA: Diagnosis not present

## 2023-08-20 DIAGNOSIS — Z171 Estrogen receptor negative status [ER-]: Secondary | ICD-10-CM | POA: Diagnosis not present

## 2023-08-20 DIAGNOSIS — M6281 Muscle weakness (generalized): Secondary | ICD-10-CM

## 2023-08-20 DIAGNOSIS — M25621 Stiffness of right elbow, not elsewhere classified: Secondary | ICD-10-CM

## 2023-08-20 NOTE — Patient Instructions (Signed)
 We will have you follow up here in 1 year for and exam. We will send you a letter about this appointment.   Please call and ask to speak with a nurse if you develop questions or concerns.

## 2023-08-20 NOTE — Therapy (Unsigned)
 OUTPATIENT PHYSICAL THERAPY EVALUATION   Patient Name: Eileen Hart MRN: [id number] DOB:[birth year], 33 y.o., female Today's Date: 08/21/2023  END OF SESSION:   PT End of Session - 08/20/23 1058     Visit Number 3    Number of Visits 25    Date for PT Re-Evaluation 10/30/23    PT Start Time 1104    PT Stop Time 1144    PT Time Calculation (min) 40 min    Activity Tolerance Patient tolerated treatment well    Behavior During Therapy [facility] for tasks assessed/performed              Past Medical History:  Diagnosis Date   Chlamydia 01/25/2010   Depression with anxiety    Gestational hypertension    Immunization, viral disease    gardasil series completed   Invasive carcinoma of breast (HCC)    Tobacco abuse 05/02/2016   Past Surgical History:  Procedure Laterality Date   AUGMENTATION MAMMAPLASTY Bilateral    saline implants 2015   BREAST BIOPSY Right 10/14/2022   US biopsy/ coil clip/ path pending   BREAST BIOPSY Right 10/14/2022   [place] RT BREAST BX W LOC DEV 1ST LESION IMG BX SPEC US GUIDE 10/14/2022 [facility]   BREAST BIOPSY Left 10/29/2022   [place] Core Bx Ribbon clip- path pending   BREAST BIOPSY Right 10/29/2022   [place] Core 1130 Venus Clip- path pending   BREAST BIOPSY Right 10/29/2022   [place] Core Bx Retroareolar heart clip-path pending   BREAST BIOPSY Right 10/29/2022   [place] Core Bx Ribbon Clip path pending   BREAST BIOPSY Right 10/29/2022   [place] RT BREAST BX W LOC DEV EA ADD LESION IMG BX SPEC US GUIDE 10/29/2022 [facility]   BREAST BIOPSY Right 10/29/2022   [place] RT BREAST BX W LOC DEV 1ST LESION IMG BX SPEC US GUIDE 10/29/2022 [facility]   BREAST BIOPSY Right 10/29/2022   [place] RT BREAST BX W LOC DEV EA ADD LESION IMG BX SPEC US GUIDE 10/29/2022 [facility]   BREAST BIOPSY Left 10/29/2022   [place] LT BREAST BX W LOC DEV 1ST LESION IMG BX SPEC US GUIDE 10/29/2022 [facility]   BREAST ENHANCEMENT SURGERY  2015   BREAST RECONSTRUCTION WITH PLACEMENT OF TISSUE  EXPANDER AND FLEX HD (ACELLULAR HYDRATED DERMIS) Bilateral 05/08/2023   Procedure: Bilateral immediate breast reconstruction with expanders and Flex HD placement;  Surgeon: Peggye Form, DO;  Location: [facility];  Service: Plastics;  Laterality: Bilateral;   CESAREAN SECTION  2014   MASTECTOMY W/ SENTINEL NODE BIOPSY Bilateral 05/08/2023   Procedure: MASTECTOMY Simple WITH SENTINEL LYMPH NODE BIOPSY, RNFA to assist;  Surgeon: Leafy Ro, MD;  Location: [facility];  Service: General;  Laterality: Bilateral;   PORTACATH PLACEMENT N/A 10/24/2022   Procedure: INSERTION PORT-A-CATH;  Surgeon: Leafy Ro, MD;  Location: [facility];  Service: General;  Laterality: N/A;   TONSILLECTOMY     Patient Active Problem List   Diagnosis Date Noted   S/P bilateral mastectomy 05/08/2023   Genetic testing 10/30/2022   Invasive carcinoma of breast (HCC) 10/22/2022   Malignant neoplasm of upper-outer quadrant of right breast in female, estrogen receptor negative (HCC) 10/22/2022   Lump of right breast 09/18/2022   Weight gain 12/04/2020   Right wrist pain 09/27/2019   Contraceptive management 07/07/2019   Anxiety and depression 12/29/2015    PCP: Glori Luis, MD  REFERRING PROVIDER: Scheeler, Kermit Balo, PA-C   REFERRING DIAG:  Diagnosis  C50.411,Z17.1 (  ICD-10-CM) - Malignant neoplasm of upper-outer quadrant of right breast in female, estrogen receptor negative (HCC)  C50.919 (ICD-10-CM) - Invasive carcinoma of breast (HCC)  Z98.890 (ICD-10-CM) - S/P breast reconstruction   Referral note: "Recovery after bilateral mastectomy and bilateral breast reconstruction 04/2023. Has expanders in place. Patient is noticing restricted ROM. Need eval for possible lymphedema as well.   Thank you all for your help."   Rationale for Evaluation and Treatment: Rehabilitation  THERAPY DIAG:  Stiffness of right elbow, not elsewhere classified  Stiffness of right shoulder, not elsewhere  classified  Muscle weakness (generalized)  Pain in right arm  ONSET DATE: 04/2023   SUBJECTIVE:                                                                                                                                                                                           SUBJECTIVE STATEMENT: Pt reports RUE is feeling a lot better. Has been completing HEP. She worked 9.5 hours yesterday, working 2x/week.       Initial Eval: The pt is a pleasant 33 y/o female, hx of breast CA, presenting to PT s/p bilateral mastectomy with bilateral breast reconstruction 05/08/2023 with expanders in place. Pt would like to address residual RUE pain, decreased ROM and strength. Pt reports pain travels from R armpit to wrist. She has difficulty fully straightening her R arm, which is impacting her ability to reach, dress, and push or pull with this arm. She is R handed. Pt also notes tightness and pressure in her R shoulder. Her strength is not what it used to be. She experiences tingling/numbness in RUE after using her phone for a while. 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 Pt reports no current activity restrictions she is aware of, but is not lifting items over 40#. She still has expanders in. Currently on chemo pill. Additionally, reports hx of R wrist tendon pain years ago, feels it every once in a while. 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 Pt has 32 y/o son.    PERTINENT HISTORY:   Pt underwent bilateral mastectomy and bilateral breast reconstruction 05/08/2023. Pt has expanders in place.  Per oncology "Patient...who underwent a bilateral diagnostic mammogram on 10/07/2022 after she felt a palpable area of concern in her right breast. Mammogram showed a 1.6 x 1 x 1.5 cm solid mass in the right breast 9:30 position 5 cm from the nipple. There was also an adjacent 6 x 9 x 2 mm mass in the right breast. No right axillary  adenopathy.  The dominant mass was biopsied and was consistent with invasive mammary carcinoma grade 3 ER and HER2 negative. "  Per MD note on 07/22/2023: "She had bilateral tissue expanders placed on 05/08/2023 with Dr. Ulice Bold. Of note, expanders were placed in the subpectoral plane. We have discussed that she will not be able to have expander to implant exchange until late June early July due to current chemotherapy regimen.Marland KitchenMarland KitchenMarland KitchenOn exam bilateral breast incisions are intact, well-healed.Marland KitchenMarland KitchenMarland KitchenBilateral tissue expanders are in place, no subcutaneous fluid collection noted palpation."  Other PMH per chart consists of tobacco use, depression with anxiety  PAIN:  Are you having pain?  Avg pain: 2-4/10 worst: 4-5/10; at rest it can be 0/10 Describes pain as sharp & tight, "it's almost like there's a cord in my arm"   PRECAUTIONS:  Pt reports no current activity restrictions she is aware of, but is not lifting items over 40#.  RED FLAGS: Pt currently being treated for breast CA, no other red flags per report     WEIGHT BEARING RESTRICTIONS:  No  FALLS:  Has patient fallen in last 6 months? No  LIVING ENVIRONMENT: Lives with: lives with their son 15 y/o  Lives in: House/apartment  OCCUPATION:   [employer], has not been able to fully return to work   PLOF:  Independent  PATIENT GOALS:  Improve RUE strength, ROM, function  NEXT MD VISIT:    OBJECTIVE:  Note: Objective measures were completed at Evaluation unless otherwise noted.  DIAGNOSTIC FINDINGS:  Please refer to chart for past imaging reports PATIENT SURVEYS:  Quick Dash 34.1  COGNITIVE STATUS: Within functional limits for tasks assessed   SENSATION: WNL UE   Coordination: Rapid alt UE is WNL   EDEMA: OT lymphedema referral request being placed to address swelling of RUE    POSTURE:  No Significant postural limitations  HAND DOMINANCE:  Right  Body Part #1 Shoulder  PALPATION: TrP noted R UT, none  currently, but pt reports she tends to be TTP near R axilla   MMT (strength testing): Gross RUE is 4/5 Gross LUE strength is 4+/5 Grip strength 4+/5 bilat   AROM BUE: Shoulder Flexion: RUE: 135 deg and pain-limited LUE: 150 deg  Shoulder abduction: RUE: 119 deg and pain-limited Left: 137 deg  Shoulder ER:  RUE: 65 deg LUE: 60 deg - slight tightness but no pain   Shoulder IR: no formal measurement taken but observed WNL bilat  Elbow ROM: Flexion WNL bilat  Extension:  Lacking 10 deg RUE Able to achieve 0 deg LUE                                                                                                                            TREATMENT DATE: 08/21/2023   TE: Supine on plinth RUE PROM scaption/flexion, elbow flex/ext x multiple reps RUE supine punch with varying degrees of elbow ext 3x10  Isometric shoulder ext 2x10x 5 sec hold/rep  AAROM with PVC shoulder  flex 10x with 3 sec hold at end range  PVC chest press 2x10 Theraputty medium-soft (pink) x 60 sec each side   Isometrics at wall: 10 reps of the following each UE Flexion - 10x 3 sec hold ER 10x 3 sec hold IR 10x 3 sec hold  Seated PVC overhead press 2x12 BUE  Wall ladder abd and flexion 2x for each UE   Wall push-ups 2x12 BUE  PVC bent over row 12x  Body-blade 2x30 sec bilat UE  Body-blade RUE x 15 sec - slightly challenging   Seated scaption 2x10 BUE   Seated shoulder circles cw/cc 10x each direction   R elbow full extension accessible but somewhat limited due to discomfort    PATIENT EDUCATION:  Education details: assessment findings, plan, HEP, goals Person educated: Patient Education method: Explanation, Demonstration, Verbal cues, and Handouts Education comprehension: verbalized understanding and returned demonstration  HOME EXERCISE PROGRAM: Access Code: [email or web address] URL: [email or web address] Date: 08/18/2023 Prepared by: Casimiro Needle  Exercises - Seated Elbow  Extension with Self-Anchored Resistance  - 1 x daily - 5-7 x weekly - 2 sets - 15 reps - Shoulder External Rotation with Resistance  - 1 x daily - 7 x weekly - 2 sets - 15 reps - Standing Shoulder Internal Rotation with Anchored Resistance  - 1 x daily - 7 x weekly - 2 sets - 15 reps - Scapular Retraction with Resistance  - 1 x daily - 7 x weekly - 2 sets - 15 reps   ASSESSMENT:  CLINICAL IMPRESSION:  Pt exhibits general progress with R shoulder mobility, particularly with R elbow ext. However, this is still limited by pain with full AROM.  Pt also noted to fatigue in R shoulder with endurance based interventions. The pt will benefit from further skilled PT to address impairments in order to decrease pain, and improve strength, ROM, ADL ability and QOL to return pt to PLOF.   OBJECTIVE IMPAIRMENTS: decreased mobility, decreased ROM, decreased strength, hypomobility, increased edema, increased fascial restrictions, impaired flexibility, impaired sensation, impaired UE functional use, and pain.   ACTIVITY LIMITATIONS: carrying, lifting, sleeping, bathing, dressing, and reach over head  PARTICIPATION LIMITATIONS: meal prep, cleaning, shopping, community activity, occupation, and yard work  PERSONAL FACTORS: Sex and 1-2 comorbidities: depression, anxiety  are also affecting patient's functional outcome.   REHAB POTENTIAL: Good  CLINICAL DECISION MAKING: Evolving/moderate complexity  EVALUATION COMPLEXITY: Moderate   GOALS: Goals reviewed with patient? Yes  SHORT TERM GOALS: Target date: 09/18/2023    Patient will be independent in home exercise program to improve strength/mobility for better functional independence with ADLs. Baseline: initiated Goal status: INITIAL   LONG TERM GOALS: Target date: 10/30/2023   Patient will increase BUE gross strength to 5/5 as to improve functional strength for ADL ability. Baseline: Gross RUE is 4/5; Gross LUE strength is 4+/5 Goal status:  INITIAL  2.  The pt will report a worst RUE pain of no greater than 2/10 over the past 2 weeks to indicate increased QOL. Baseline:  4-5/10 Goal status: INITIAL  3.  Patient will improve RUE shoulder AROM to > 140 degrees of pain-free flexion and abduction for improved ability to perform overhead activities. Baseline: flexion 135 deg and pain-limited, abduction 119 deg and pain-limited  Goal status: INITIAL  4.   Patient will decrease Quick DASH score by > 8 points demonstrating reduced self-reported upper extremity disability. Baseline: 34.1 Goal status: INITIAL   PLAN:  PT FREQUENCY: 1-2x/week  PT DURATION: 12 weeks  PLANNED INTERVENTIONS: [id number]- PT Re-evaluation, 97110-Therapeutic exercises, 97530- Therapeutic activity, [id number]- Neuromuscular re-education, 97535- Self Care, [id number]- Manual therapy, [phone]- Gait training, [phone]- Orthotic Fit/training, [phone]- Canalith repositioning, [id number]- Splinting, [phone]- Electrical stimulation (manual), Patient/Family education, Balance training, Stair training, Taping, Dry Needling, Joint mobilization, Spinal mobilization, Scar mobilization, DME instructions, Cryotherapy, and Moist heat.  PLAN FOR NEXT SESSION:  - progressive B UE strength training within pt's tolerance  - focus on progressing to strengthening exercises with arm extended positioning     Temple Pacini PT, DPT  Physical Therapist - [facility]  11:20 AM 08/21/23

## 2023-08-20 NOTE — Progress Notes (Signed)
 Outpatient Surgical Follow Up  08/20/2023  Eileen Hart is an 33 y.o. female.   Chief Complaint  Patient presents with   Follow-up    Bil mastectomy 05/08/23    HPI: Eileen Hart is 3-1/2 months from bilateral mastectomies with right sentinel lymph node biopsy and immediate reconstruction. She is doing really well.  Denies any breast complaints and she is happy w cosmetic outcomes. SHe will be getting implants in the upcoming future. SOme right arm discomofrt that has improved. She is on Xeloda  Past Medical History:  Diagnosis Date   Chlamydia 01/25/2010   Depression with anxiety    Gestational hypertension    Immunization, viral disease    gardasil series completed   Invasive carcinoma of breast (HCC)    Tobacco abuse 05/02/2016    Past Surgical History:  Procedure Laterality Date   AUGMENTATION MAMMAPLASTY Bilateral    saline implants 2015   BREAST BIOPSY Right 10/14/2022   US biopsy/ coil clip/ path pending   BREAST BIOPSY Right 10/14/2022   [place] RT BREAST BX W LOC DEV 1ST LESION IMG BX SPEC US GUIDE 10/14/2022 [facility]   BREAST BIOPSY Left 10/29/2022   [place] Core Bx Ribbon clip- path pending   BREAST BIOPSY Right 10/29/2022   [place] Core 1130 Venus Clip- path pending   BREAST BIOPSY Right 10/29/2022   [place] Core Bx Retroareolar heart clip-path pending   BREAST BIOPSY Right 10/29/2022   [place] Core Bx Ribbon Clip path pending   BREAST BIOPSY Right 10/29/2022   [place] RT BREAST BX W LOC DEV EA ADD LESION IMG BX SPEC US GUIDE 10/29/2022 [facility]   BREAST BIOPSY Right 10/29/2022   [place] RT BREAST BX W LOC DEV 1ST LESION IMG BX SPEC US GUIDE 10/29/2022 [facility]   BREAST BIOPSY Right 10/29/2022   [place] RT BREAST BX W LOC DEV EA ADD LESION IMG BX SPEC US GUIDE 10/29/2022 [facility]   BREAST BIOPSY Left 10/29/2022   [place] LT BREAST BX W LOC DEV 1ST LESION IMG BX SPEC US GUIDE 10/29/2022 [facility]   BREAST ENHANCEMENT SURGERY  2015   BREAST RECONSTRUCTION WITH PLACEMENT OF TISSUE  EXPANDER AND FLEX HD (ACELLULAR HYDRATED DERMIS) Bilateral 05/08/2023   Procedure: Bilateral immediate breast reconstruction with expanders and Flex HD placement;  Surgeon: Peggye Form, DO;  Location: [facility];  Service: Plastics;  Laterality: Bilateral;   CESAREAN SECTION  2014   MASTECTOMY W/ SENTINEL NODE BIOPSY Bilateral 05/08/2023   Procedure: MASTECTOMY Simple WITH SENTINEL LYMPH NODE BIOPSY, RNFA to assist;  Surgeon: Leafy Ro, MD;  Location: [facility];  Service: General;  Laterality: Bilateral;   PORTACATH PLACEMENT N/A 10/24/2022   Procedure: INSERTION PORT-A-CATH;  Surgeon: Leafy Ro, MD;  Location: [facility];  Service: General;  Laterality: N/A;   TONSILLECTOMY      Family History  Problem Relation Age of Onset   Breast cancer Other 35       mat 2nd cousin   Diabetes Neg Hx    Heart disease Neg Hx    Hypertension Neg Hx    Ovarian cancer Neg Hx    Colon cancer Neg Hx     Social History:  reports that she quit smoking about 5 years ago. Her smoking use included cigarettes. She has been exposed to tobacco smoke. She has never used smokeless tobacco. She reports that she does not drink alcohol and does not use drugs.  Allergies: No Known Allergies  Medications reviewed.    ROS Full  ROS performed and is otherwise negative other than what is stated in HPI   BP 119/79   Pulse 73   Temp 98.9 F (37.2 C) (Oral)   Ht 5\' 3"  (1.6 m)   Wt 111 lb 12.8 oz (50.7 kg)   SpO2 100%   BMI 19.80 kg/m   Physical Exam Vitals and nursing note reviewed. Exam conducted with a chaperone present.  Constitutional:      Appearance: Normal appearance.  Cardiovascular:     Rate and Rhythm: Normal rate and regular rhythm.     Heart sounds: No murmur heard. Pulmonary:     Effort: Pulmonary effort is normal. No respiratory distress.     Breath sounds: Normal breath sounds. No stridor.     Comments: BREAST: Evidence of prior mastectomy scars, nipples are viable, no  seromas or hematomas, no evidende of adenopathy or chest wall lesions. No evidence of lymphedema Abdominal:     General: Abdomen is flat. There is no distension.     Palpations: Abdomen is soft. There is no mass.     Tenderness: There is no abdominal tenderness. There is no guarding.     Hernia: No hernia is present.  Musculoskeletal:     Cervical back: Normal range of motion and neck supple. No rigidity or tenderness.  Skin:    General: Skin is warm and dry.     Capillary Refill: Capillary refill takes less than 2 seconds.  Neurological:     General: No focal deficit present.     Mental Status: She is alert.  Psychiatric:        Mood and Affect: Mood normal.        Behavior: Behavior normal.        Thought Content: Thought content normal.        Judgment: Judgment normal.   Chaperone present  Assessment/Plan: 33 year old female status post bilateral mastectomies doing very well.  There is no evidence of complications.  I will be happy to see her back yearly follow-up.  Sterling Big, MD [facility] General Surgeon

## 2023-08-21 ENCOUNTER — Ambulatory Visit (INDEPENDENT_AMBULATORY_CARE_PROVIDER_SITE_OTHER): Payer: Medicaid Other | Admitting: Surgical

## 2023-08-21 VITALS — BP 124/85 | HR 80

## 2023-08-21 DIAGNOSIS — C50919 Malignant neoplasm of unspecified site of unspecified female breast: Secondary | ICD-10-CM

## 2023-08-21 DIAGNOSIS — Z171 Estrogen receptor negative status [ER-]: Secondary | ICD-10-CM

## 2023-08-21 DIAGNOSIS — C50411 Malignant neoplasm of upper-outer quadrant of right female breast: Secondary | ICD-10-CM

## 2023-08-21 NOTE — Progress Notes (Signed)
 Patient is a very pleasant 33 year old female here for follow-up on her bilateral breast reconstruction.  She had bilateral breast tissue expanders placed on 05/08/2023 with Dr. Ulice Bold.  She is currently receiving p.o. chemotherapy with Xeloda.  She reports that she will finish Xeloda early June and can undergo exchange surgery about 4 to 6 weeks after this.  She reports she is overall happy with the current size, has a goal of being very similar to her premastectomy size.  She did have implants prior to mastectomy.  She reports that she overall feels as if the size is very close, would like 1 additional fill if possible today.  She also noticed that the left breast expander feels softer than it was and she is noticing some asymmetry.  Chaperone present on exam On exam bilateral breast incisions are intact, bilateral NAC's are viable.  Right breast tissue expander is firm, left breast tissue expander is soft.  Asymmetry is noted.  No overlying skin changes.  No erythema or cellulitic change.  No subcutaneous fluid collection noted.  A/P:  Patient is a very pleasant 33 year old female here for follow-up and ongoing expansion of bilateral breast reconstruction.  She was last seen in the office 1 month ago and was overall doing well.  She reports since her last appointment she has noticed that the left breast expander has become soft.  On exam this is noted as well, it is possible she has a small expander leak.  We were able to reexpand today for improved symmetry, photos were taken and we will plan to reevaluate in 2 weeks.  We discussed that if there is a leak, we would need to continue to expand to keep breast symmetric over the next few months until we would be able to exchange in July.  We placed injectable saline in the Expander using a sterile technique: Right: 30 cc for a total of 430 / 455 cc Left: 80 cc for a total of ??? 480/ 455 cc  Pictures were obtained of the patient and placed in  the chart with the patient's or guardian's permission.

## 2023-08-22 ENCOUNTER — Ambulatory Visit: Payer: Medicaid Other | Admitting: Physical Therapy

## 2023-08-25 ENCOUNTER — Ambulatory Visit: Payer: Medicaid Other | Attending: Surgical

## 2023-08-25 ENCOUNTER — Ambulatory Visit: Payer: Medicaid Other

## 2023-08-25 DIAGNOSIS — L905 Scar conditions and fibrosis of skin: Secondary | ICD-10-CM | POA: Diagnosis present

## 2023-08-25 DIAGNOSIS — I890 Lymphedema, not elsewhere classified: Secondary | ICD-10-CM | POA: Insufficient documentation

## 2023-08-25 DIAGNOSIS — M25611 Stiffness of right shoulder, not elsewhere classified: Secondary | ICD-10-CM | POA: Diagnosis present

## 2023-08-25 DIAGNOSIS — M25621 Stiffness of right elbow, not elsewhere classified: Secondary | ICD-10-CM | POA: Diagnosis present

## 2023-08-25 DIAGNOSIS — M6281 Muscle weakness (generalized): Secondary | ICD-10-CM | POA: Diagnosis present

## 2023-08-25 DIAGNOSIS — Z9013 Acquired absence of bilateral breasts and nipples: Secondary | ICD-10-CM

## 2023-08-25 DIAGNOSIS — L7682 Other postprocedural complications of skin and subcutaneous tissue: Secondary | ICD-10-CM | POA: Insufficient documentation

## 2023-08-25 DIAGNOSIS — M79601 Pain in right arm: Secondary | ICD-10-CM

## 2023-08-25 NOTE — Therapy (Unsigned)
 OUTPATIENT PHYSICAL THERAPY LYMPHEDEMA EVALUATION  Patient Name: Eileen Hart MRN: [id number] DOB:01-08-1991, 33 y.o., female Today's Date: 08/25/2023  END OF SESSION:   Past Medical History:  Diagnosis Date   Chlamydia 01/25/2010   Depression with anxiety    Gestational hypertension    Immunization, viral disease    gardasil series completed   Invasive carcinoma of breast (HCC)    Tobacco abuse 05/02/2016   Past Surgical History:  Procedure Laterality Date   AUGMENTATION MAMMAPLASTY Bilateral    saline implants 2015   BREAST BIOPSY Right 10/14/2022   US biopsy/ coil clip/ path pending   BREAST BIOPSY Right 10/14/2022   [place] RT BREAST BX W LOC DEV 1ST LESION IMG BX SPEC US GUIDE 10/14/2022 [facility]   BREAST BIOPSY Left 10/29/2022   [place] Core Bx Ribbon clip- path pending   BREAST BIOPSY Right 10/29/2022   [place] Core 1130 Venus Clip- path pending   BREAST BIOPSY Right 10/29/2022   [place] Core Bx Retroareolar heart clip-path pending   BREAST BIOPSY Right 10/29/2022   [place] Core Bx Ribbon Clip path pending   BREAST BIOPSY Right 10/29/2022   [place] RT BREAST BX W LOC DEV EA ADD LESION IMG BX SPEC US GUIDE 10/29/2022 [facility]   BREAST BIOPSY Right 10/29/2022   [place] RT BREAST BX W LOC DEV 1ST LESION IMG BX SPEC US GUIDE 10/29/2022 [facility]   BREAST BIOPSY Right 10/29/2022   [place] RT BREAST BX W LOC DEV EA ADD LESION IMG BX SPEC US GUIDE 10/29/2022 [facility]   BREAST BIOPSY Left 10/29/2022   [place] LT BREAST BX W LOC DEV 1ST LESION IMG BX SPEC US GUIDE 10/29/2022 [facility]   BREAST ENHANCEMENT SURGERY  2015   BREAST RECONSTRUCTION WITH PLACEMENT OF TISSUE EXPANDER AND FLEX HD (ACELLULAR HYDRATED DERMIS) Bilateral 05/08/2023   Procedure: Bilateral immediate breast reconstruction with expanders and Flex HD placement;  Surgeon: Peggye Form, DO;  Location: [facility];  Service: Plastics;  Laterality: Bilateral;   CESAREAN SECTION  2014   MASTECTOMY W/ SENTINEL NODE BIOPSY  Bilateral 05/08/2023   Procedure: MASTECTOMY Simple WITH SENTINEL LYMPH NODE BIOPSY, RNFA to assist;  Surgeon: Leafy Ro, MD;  Location: [facility];  Service: General;  Laterality: Bilateral;   PORTACATH PLACEMENT N/A 10/24/2022   Procedure: INSERTION PORT-A-CATH;  Surgeon: Leafy Ro, MD;  Location: [facility];  Service: General;  Laterality: N/A;   TONSILLECTOMY     Patient Active Problem List   Diagnosis Date Noted   S/P bilateral mastectomy 05/08/2023   Genetic testing 10/30/2022   Invasive carcinoma of breast (HCC) 10/22/2022   Malignant neoplasm of upper-outer quadrant of right breast in female, estrogen receptor negative (HCC) 10/22/2022   Lump of right breast 09/18/2022   Weight gain 12/04/2020   Right wrist pain 09/27/2019   Contraceptive management 07/07/2019   Anxiety and depression 12/29/2015    PCP: Dr. Birdie Sons (just left but pt will still be in this practice and doesn't have a replacement yet)  REFERRING PROVIDER: Keenan Bachelor, J, PA  REFERRING DIAG:  THERAPY DIAG:  No diagnosis found.  Rationale for Evaluation and Treatment: Rehabilitation  ONSET DATE: Feb 1st 2025  SUBJECTIVE:  SUBJECTIVE STATEMENT:  PERTINENT HISTORY:    PAIN:  Are you having pain? No pain at rest, 5/10 pain with activity NPRS scale: 5/10 Pain location: R volar arm from the shoulder to the wrist  Pain orientation: Right  PAIN TYPE: sharp Pain description: intermittent  Aggravating factors: extending the elbow/reaching Relieving factors: rest   PRECAUTIONS: {Therapy precautions:24002}  RED FLAGS: {PT Red Flags:29287}   WEIGHT BEARING RESTRICTIONS: {Yes ***/No:24003}  FALLS:  Has patient fallen in last 6 months? 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  COGNITION: Overall cognitive status: Within functional limits for tasks assessed   PALPATION: slight cording R volar forearm   OBSERVATIONS / OTHER ASSESSMENTS: tender at the biceps and volar forearm   SENSATION: ulnar nerve tingling with prolonged elbow flexion (playing video games with son or holding phone) Light touch: {intact/deficits:24005} Stereognosis: {intact/deficits:24005} Hot/Cold: {intact/deficits:24005} Proprioception: {intact/deficits:24005}  POSTURE: ***  UPPER EXTREMITY AROM/PROM:  A/PROM RIGHT   eval   Shoulder extension   Shoulder flexion   Shoulder abduction   Shoulder internal rotation   Shoulder external rotation     (Blank rows = not tested)  A/PROM LEFT   eval  Shoulder extension   Shoulder flexion   Shoulder abduction   Shoulder internal rotation   Shoulder external rotation     (Blank rows = not tested)  CERVICAL AROM: All within normal limits:    Percent limited  Flexion   Extension   Right lateral flexion   Left lateral flexion   Right rotation   Left rotation    UPPER EXTREMITY STRENGTH: ***  LOWER EXTREMITY AROM/PROM:  A/PROM Right eval  Hip flexion   Hip extension   Hip abduction   Hip adduction   Hip internal rotation   Hip external rotation   Knee flexion   Knee extension   Ankle dorsiflexion   Ankle plantarflexion   Ankle inversion   Ankle eversion    (Blank rows = not tested)  A/PROM LEFT eval  Hip flexion   Hip extension   Hip abduction   Hip  adduction   Hip internal rotation   Hip external rotation   Knee flexion   Knee extension   Ankle dorsiflexion   Ankle plantarflexion   Ankle inversion   Ankle eversion    (Blank rows = not tested)  LOWER EXTREMITY MMT: ***  LYMPHEDEMA ASSESSMENTS:   SURGERY TYPE/DATE: ***  NUMBER OF LYMPH NODES REMOVED: 2 axillary lymph nodes removed   CHEMOTHERAPY: 16 rounds ending early/mid Oct 2024, now has a chemo pill 2x/day for 14 days, 1 week off for 8 cycles   RADIATION: None  HORMONE TREATMENT: None  INFECTIONS: None  LYMPHEDEMA ASSESSMENTS:   LANDMARK RIGHT   eval  10 cm proximal to olecranon process   Olecranon process   10 cm proximal to ulnar styloid process   Just proximal to ulnar styloid process   Across hand at thumb web space   At base of 2nd digit   (Blank rows = not tested)  LANDMARK LEFT   eval  10 cm proximal to olecranon process   Olecranon process   10 cm proximal to ulnar styloid process   Just proximal to ulnar styloid process   Across hand at thumb web space   At base of 2nd digit   (Blank rows = not tested)   LE LANDMARK RIGHT eval  At groin   30 cm proximal to suprapatella   20 cm proximal to suprapatella   10 cm proximal to suprapatella   At midpatella / popliteal crease   30 cm proximal to floor at lateral plantar foot   20 cm proximal to floor at lateral plantar foot   10 cm proximal to floor at lateral plantar foot   Circumference of ankle/heel   5 cm proximal to 1st MTP joint   Across MTP joint   Around proximal great toe   (Blank rows = not tested)  LE LANDMARK LEFT eval  At groin   30 cm proximal to suprapatella   20 cm proximal to suprapatella   10 cm proximal to suprapatella   At midpatella / popliteal crease   30 cm proximal to floor at lateral plantar foot   20 cm proximal to floor at lateral plantar foot   10 cm proximal to floor at lateral plantar foot   Circumference of ankle/heel   5 cm proximal to 1st MTP joint    Across MTP joint   Around proximal great toe   (Blank rows = not tested)  UE  Landmark R/L Date: R/L Date: R/L Date: R/L Date:  1  MP (distal base of SF) 18.7 17.9    2  Wrist crease 15.5 15.0    3 Forearm (*) +4 cm 15.0 14.5    4 * +4 cm 17.3 16.4    5 * +4 cm 20.0 18.8    6 * +4 cm 21.9 21.5    7 * +4 cm 22.8 22.7    8  Elbow crease 22.2 22.7    9 Upper arm (**) +4 cm 22.0 21.8    10 ** +4 cm 22.2 22.5    11 ** +4 cm 23.7 23.8    12 ** +4 cm 25.4 25.5                      Total Limb Volume: ml ml ml ml ml    Limb volume differential % %        Volume change from initial V %  %     FUNCTIONAL TESTS:  {Functional tests:24029}  GAIT: Distance walked: *** Assistive device utilized: {Assistive devices:23999} Level of assistance: {Levels of assistance:24026} Comments: ***  L-DEX LYMPHEDEMA SCREENING:  The patient was assessed using the L-Dex machine today to produce a lymphedema index baseline score. The patient will be reassessed on a regular basis (typically every 3 months) to obtain new L-Dex scores. If the score is > 6.5 points away from his/her baseline score indicating onset of subclinical lymphedema, it will be recommended to wear a compression garment for 4 weeks, 12 hours per day and then be reassessed. If the score continues to be > 6.5 points from baseline at reassessment, we will initiate lymphedema treatment. Assessing in this manner has a 95% rate of preventing clinically significant lymphedema.     QUICK DASH SURVEY: ***   TODAY'S TREATMENT:  DATE: 08/25/23   PATIENT EDUCATION:  Education details: *** Person educated: {Person educated:25204} Education method: {Education Method:25205} Education comprehension: {Education Comprehension:25206}  HOME EXERCISE PROGRAM: ***  ASSESSMENT:  CLINICAL IMPRESSION: Patient is a ***  y.o. *** who was seen today for physical therapy evaluation and treatment for ***.   OBJECTIVE IMPAIRMENTS: {opptimpairments:25111}.   ACTIVITY LIMITATIONS: {activitylimitations:27494}  PARTICIPATION LIMITATIONS: {participationrestrictions:25113}  PERSONAL FACTORS: {Personal factors:25162} are also affecting patient's functional outcome.   REHAB POTENTIAL: {rehabpotential:25112}  CLINICAL DECISION MAKING: {clinical decision making:25114}  EVALUATION COMPLEXITY: {Evaluation complexity:25115}  GOALS: Goals reviewed with patient? {yes/no:20286}  SHORT TERM GOALS: Target date: ***  *** Baseline: Goal status: INITIAL  2.  *** Baseline:  Goal status: INITIAL  3.  *** Baseline:  Goal status: INITIAL  4.  *** Baseline:  Goal status: INITIAL  5.  *** Baseline:  Goal status: INITIAL  6.  *** Baseline:  Goal status: INITIAL  LONG TERM GOALS: Target date: ***  *** Baseline:  Goal status: INITIAL  2.  *** Baseline:  Goal status: INITIAL  3.  *** Baseline:  Goal status: INITIAL  4.  *** Baseline:  Goal status: INITIAL  5.  *** Baseline:  Goal status: INITIAL  6.  *** Baseline:  Goal status: INITIAL  PLAN:  PT FREQUENCY: {rehab frequency:25116}  PT DURATION: {rehab duration:25117}  PLANNED INTERVENTIONS: {rehab planned interventions:25118::"97110-Therapeutic exercises","97530- Therapeutic [id number]- Neuromuscular re-education","97535- Self QION","62952- Manual therapy"}  PLAN FOR NEXT SESSION: ***   Otis Dials, OT 08/25/2023, 11:09 AM

## 2023-08-25 NOTE — Therapy (Signed)
 OUTPATIENT PHYSICAL THERAPY TREATMENT   Patient Name: Eileen Hart MRN: [id number] DOB:06/09/1991, 33 y.o., female Today's Date: 08/25/2023  END OF SESSION:   PT End of Session - 08/25/23 1144     Visit Number 4    Number of Visits 25    Date for PT Re-Evaluation 10/30/23    PT Start Time 1145    PT Stop Time 1227    PT Time Calculation (min) 42 min    Activity Tolerance Patient tolerated treatment well    Behavior During Therapy [facility] for tasks assessed/performed               Past Medical History:  Diagnosis Date   Chlamydia 01/25/2010   Depression with anxiety    Gestational hypertension    Immunization, viral disease    gardasil series completed   Invasive carcinoma of breast (HCC)    Tobacco abuse 05/02/2016   Past Surgical History:  Procedure Laterality Date   AUGMENTATION MAMMAPLASTY Bilateral    saline implants 2015   BREAST BIOPSY Right 10/14/2022   US biopsy/ coil clip/ path pending   BREAST BIOPSY Right 10/14/2022   [place] RT BREAST BX W LOC DEV 1ST LESION IMG BX SPEC US GUIDE 10/14/2022 [facility]   BREAST BIOPSY Left 10/29/2022   [place] Core Bx Ribbon clip- path pending   BREAST BIOPSY Right 10/29/2022   [place] Core 1130 Venus Clip- path pending   BREAST BIOPSY Right 10/29/2022   [place] Core Bx Retroareolar heart clip-path pending   BREAST BIOPSY Right 10/29/2022   [place] Core Bx Ribbon Clip path pending   BREAST BIOPSY Right 10/29/2022   [place] RT BREAST BX W LOC DEV EA ADD LESION IMG BX SPEC US GUIDE 10/29/2022 [facility]   BREAST BIOPSY Right 10/29/2022   [place] RT BREAST BX W LOC DEV 1ST LESION IMG BX SPEC US GUIDE 10/29/2022 [facility]   BREAST BIOPSY Right 10/29/2022   [place] RT BREAST BX W LOC DEV EA ADD LESION IMG BX SPEC US GUIDE 10/29/2022 [facility]   BREAST BIOPSY Left 10/29/2022   [place] LT BREAST BX W LOC DEV 1ST LESION IMG BX SPEC US GUIDE 10/29/2022 [facility]   BREAST ENHANCEMENT SURGERY  2015   BREAST RECONSTRUCTION WITH PLACEMENT OF TISSUE  EXPANDER AND FLEX HD (ACELLULAR HYDRATED DERMIS) Bilateral 05/08/2023   Procedure: Bilateral immediate breast reconstruction with expanders and Flex HD placement;  Surgeon: Peggye Form, DO;  Location: [facility];  Service: Plastics;  Laterality: Bilateral;   CESAREAN SECTION  2014   MASTECTOMY W/ SENTINEL NODE BIOPSY Bilateral 05/08/2023   Procedure: MASTECTOMY Simple WITH SENTINEL LYMPH NODE BIOPSY, RNFA to assist;  Surgeon: Leafy Ro, MD;  Location: [facility];  Service: General;  Laterality: Bilateral;   PORTACATH PLACEMENT N/A 10/24/2022   Procedure: INSERTION PORT-A-CATH;  Surgeon: Leafy Ro, MD;  Location: [facility];  Service: General;  Laterality: N/A;   TONSILLECTOMY     Patient Active Problem List   Diagnosis Date Noted   S/P bilateral mastectomy 05/08/2023   Genetic testing 10/30/2022   Invasive carcinoma of breast (HCC) 10/22/2022   Malignant neoplasm of upper-outer quadrant of right breast in female, estrogen receptor negative (HCC) 10/22/2022   Lump of right breast 09/18/2022   Weight gain 12/04/2020   Right wrist pain 09/27/2019   Contraceptive management 07/07/2019   Anxiety and depression 12/29/2015    PCP: Glori Luis, MD  REFERRING PROVIDER: Scheeler, Kermit Balo, PA-C   REFERRING DIAG:  Diagnosis  C50.411,Z17.1 (ICD-10-CM) - Malignant neoplasm of upper-outer quadrant of right breast in female, estrogen receptor negative (HCC)  C50.919 (ICD-10-CM) - Invasive carcinoma of breast (HCC)  Z98.890 (ICD-10-CM) - S/P breast reconstruction   Referral note: "Recovery after bilateral mastectomy and bilateral breast reconstruction 04/2023. Has expanders in place. Patient is noticing restricted ROM. Need eval for possible lymphedema as well.   Thank you all for your help."   Rationale for Evaluation and Treatment: Rehabilitation  THERAPY DIAG:  Stiffness of right elbow, not elsewhere classified  Stiffness of right shoulder, not elsewhere  classified  Muscle weakness (generalized)  Pain in right arm  ONSET DATE: 04/2023   SUBJECTIVE:                                                                                                                                                                                           SUBJECTIVE STATEMENT: Patient reports R arm is doing a lot better and has good range. 1/10 pain with movement     Initial Eval: The pt is a pleasant 33 y/o female, hx of breast CA, presenting to PT s/p bilateral mastectomy with bilateral breast reconstruction 05/08/2023 with expanders in place. Pt would like to address residual RUE pain, decreased ROM and strength. Pt reports pain travels from R armpit to wrist. She has difficulty fully straightening her R arm, which is impacting her ability to reach, dress, and push or pull with this arm. She is R handed. Pt also notes tightness and pressure in her R shoulder. Her strength is not what it used to be. She experiences tingling/numbness in RUE after using her phone for a while. 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 Pt reports no current activity restrictions she is aware of, but is not lifting items over 40#. She still has expanders in. Currently on chemo pill. Additionally, reports hx of R wrist tendon pain years ago, feels it every once in a while. 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 Pt has 39 y/o son.    PERTINENT HISTORY:   Pt underwent bilateral mastectomy and bilateral breast reconstruction 05/08/2023. Pt has expanders in place.  Per oncology "Patient...who underwent a bilateral diagnostic mammogram on 10/07/2022 after she felt a palpable area of concern in her right breast. Mammogram showed a 1.6 x 1 x 1.5 cm solid mass in the right breast 9:30 position 5 cm from the nipple. There was also an adjacent 6 x 9 x 2 mm mass in the right breast. No right axillary adenopathy. The dominant mass was  biopsied and was consistent with invasive mammary carcinoma grade 3 ER and HER2 negative. "  Per MD note on 07/22/2023: "She had bilateral tissue expanders placed on 05/08/2023 with Dr. Ulice Bold. Of note, expanders were placed in the subpectoral plane. We have discussed that she will not be able to have expander to implant exchange until late June early July due to current chemotherapy regimen.Marland KitchenMarland KitchenMarland KitchenOn exam bilateral breast incisions are intact, well-healed.Marland KitchenMarland KitchenMarland KitchenBilateral tissue expanders are in place, no subcutaneous fluid collection noted palpation."  Other PMH per chart consists of tobacco use, depression with anxiety  PAIN:  Are you having pain?  Avg pain: 2-4/10 worst: 4-5/10; at rest it can be 0/10 Describes pain as sharp & tight, "it's almost like there's a cord in my arm"   PRECAUTIONS:  Pt reports no current activity restrictions she is aware of, but is not lifting items over 40#.  RED FLAGS: Pt currently being treated for breast CA, no other red flags per report     WEIGHT BEARING RESTRICTIONS:  No  FALLS:  Has patient fallen in last 6 months? No  LIVING ENVIRONMENT: Lives with: lives with their son 58 y/o  Lives in: House/apartment  OCCUPATION:   [employer] dogs, has not been able to fully return to work   PLOF:  Independent  PATIENT GOALS:  Improve RUE strength, ROM, function  NEXT MD VISIT:    OBJECTIVE:  Note: Objective measures were completed at Evaluation unless otherwise noted.  DIAGNOSTIC FINDINGS:  Please refer to chart for past imaging reports PATIENT SURVEYS:  Quick Dash 34.1  COGNITIVE STATUS: Within functional limits for tasks assessed   SENSATION: WNL UE   Coordination: Rapid alt UE is WNL   EDEMA: OT lymphedema referral request being placed to address swelling of RUE    POSTURE:  No Significant postural limitations  HAND DOMINANCE:  Right  Body Part #1 Shoulder  PALPATION: TrP noted R UT, none currently, but pt reports she tends  to be TTP near R axilla   MMT (strength testing): Gross RUE is 4/5 Gross LUE strength is 4+/5 Grip strength 4+/5 bilat   AROM BUE: Shoulder Flexion: RUE: 135 deg and pain-limited LUE: 150 deg  Shoulder abduction: RUE: 119 deg and pain-limited Left: 137 deg  Shoulder ER:  RUE: 65 deg LUE: 60 deg - slight tightness but no pain   Shoulder IR: no formal measurement taken but observed WNL bilat  Elbow ROM: Flexion WNL bilat  Extension:  Lacking 10 deg RUE Able to achieve 0 deg LUE                                                                                                                            TREATMENT DATE: 08/25/2023    TE:  Seated shoulder flexion 2 x 15 each UE with 2# DB Seated shoulder abduction 2 x 15 each UE with 2# DB Seated shoulder ER with towel under elbow 2 x 15 each with 2# DB  Shoulder stabilization with rainbow ball on wall 2 x 30 seconds each UE (clockwise) Standing horizontal abduction with 2# DB 2 x 15  Wall push-ups 2x12 BUE Wall ladder abd and flexion x10 for each UE  Seated row with RTB x 15, with GTB x 15    PATIENT EDUCATION:  Education details: assessment findings, plan, HEP, goals Person educated: Patient Education method: Explanation, Demonstration, Verbal cues, and Handouts Education comprehension: verbalized understanding and returned demonstration  HOME EXERCISE PROGRAM: Access Code: [email or web address] URL: [email or web address] Date: 08/18/2023 Prepared by: Casimiro Needle  Exercises - Seated Elbow Extension with Self-Anchored Resistance  - 1 x daily - 5-7 x weekly - 2 sets - 15 reps - Shoulder External Rotation with Resistance  - 1 x daily - 7 x weekly - 2 sets - 15 reps - Standing Shoulder Internal Rotation with Anchored Resistance  - 1 x daily - 7 x weekly - 2 sets - 15 reps - Scapular Retraction with Resistance  - 1 x daily - 7 x weekly - 2 sets - 15 reps   ASSESSMENT:  CLINICAL IMPRESSION:   Session focused on B  UE strengthening and endurance. Continues to fatigue with endurance based interventions but demonstrating improved ROM bilaterally. ER continues to be limited on R >L. Patient will benefit from further skilled PT to address impairments in order to decrease pain, and improve strength, ROM, ADL ability and QOL to return pt to PLOF.  OBJECTIVE IMPAIRMENTS: decreased mobility, decreased ROM, decreased strength, hypomobility, increased edema, increased fascial restrictions, impaired flexibility, impaired sensation, impaired UE functional use, and pain.   ACTIVITY LIMITATIONS: carrying, lifting, sleeping, bathing, dressing, and reach over head  PARTICIPATION LIMITATIONS: meal prep, cleaning, shopping, community activity, occupation, and yard work  PERSONAL FACTORS: Sex and 1-2 comorbidities: depression, anxiety  are also affecting patient's functional outcome.   REHAB POTENTIAL: Good  CLINICAL DECISION MAKING: Evolving/moderate complexity  EVALUATION COMPLEXITY: Moderate   GOALS: Goals reviewed with patient? Yes  SHORT TERM GOALS: Target date: 09/18/2023    Patient will be independent in home exercise program to improve strength/mobility for better functional independence with ADLs. Baseline: initiated Goal status: INITIAL   LONG TERM GOALS: Target date: 10/30/2023   Patient will increase BUE gross strength to 5/5 as to improve functional strength for ADL ability. Baseline: Gross RUE is 4/5; Gross LUE strength is 4+/5 Goal status: INITIAL  2.  The pt will report a worst RUE pain of no greater than 2/10 over the past 2 weeks to indicate increased QOL. Baseline:  4-5/10 Goal status: INITIAL  3.  Patient will improve RUE shoulder AROM to > 140 degrees of pain-free flexion and abduction for improved ability to perform overhead activities. Baseline: flexion 135 deg and pain-limited, abduction 119 deg and pain-limited  Goal status: INITIAL  4.   Patient will decrease Quick DASH score by  > 8 points demonstrating reduced self-reported upper extremity disability. Baseline: 34.1 Goal status: INITIAL   PLAN:  PT FREQUENCY: 1-2x/week  PT DURATION: 12 weeks  PLANNED INTERVENTIONS: 97164- PT Re-evaluation, 97110-Therapeutic exercises, 97530- Therapeutic activity, [id number]- Neuromuscular re-education, 97535- Self Care, [id number]- Manual therapy, [id number]- Gait training, [phone]- Orthotic Fit/training, [phone]- Canalith repositioning, [id number]- Splinting, [phone]- Electrical stimulation (manual), Patient/Family education, Balance training, Stair training, Taping, Dry Needling, Joint mobilization, Spinal mobilization, Scar mobilization, DME instructions, Cryotherapy, and Moist heat.  PLAN FOR NEXT SESSION:  - progressive B UE strength training within pt's tolerance  - focus on progressing to strengthening exercises with arm extended positioning  Maylon Peppers, PT, DPT   Physical Therapist - [facility]  11:45 AM 08/25/23

## 2023-08-27 ENCOUNTER — Encounter: Payer: Self-pay | Admitting: Oncology

## 2023-08-27 ENCOUNTER — Ambulatory Visit: Admitting: Plastic Surgery

## 2023-08-27 ENCOUNTER — Ambulatory Visit: Payer: Medicaid Other

## 2023-08-27 ENCOUNTER — Telehealth: Payer: Self-pay | Admitting: *Deleted

## 2023-08-27 DIAGNOSIS — Z171 Estrogen receptor negative status [ER-]: Secondary | ICD-10-CM

## 2023-08-27 DIAGNOSIS — M25621 Stiffness of right elbow, not elsewhere classified: Secondary | ICD-10-CM | POA: Diagnosis not present

## 2023-08-27 DIAGNOSIS — M25611 Stiffness of right shoulder, not elsewhere classified: Secondary | ICD-10-CM

## 2023-08-27 DIAGNOSIS — L905 Scar conditions and fibrosis of skin: Secondary | ICD-10-CM

## 2023-08-27 DIAGNOSIS — M6281 Muscle weakness (generalized): Secondary | ICD-10-CM

## 2023-08-27 DIAGNOSIS — Z9013 Acquired absence of bilateral breasts and nipples: Secondary | ICD-10-CM

## 2023-08-27 DIAGNOSIS — C50411 Malignant neoplasm of upper-outer quadrant of right female breast: Secondary | ICD-10-CM

## 2023-08-27 DIAGNOSIS — I890 Lymphedema, not elsewhere classified: Secondary | ICD-10-CM

## 2023-08-27 NOTE — Telephone Encounter (Signed)
 Per Dr. Smith Robert - Patient's breast implant has a leak and Dr. Ulice Bold from plastic surgery wanted to do an implant exchange and wanted [place] to hold off on doing Xeloda for now. Please inform patient that she will need to hold off on taking Xeloda at least for the next 3 weeks and I have asked Dr. Ulice Bold to get in touch with me once she is ready to restart Xeloda   I personally reached out to patient to discuss plan of care and concerns addressed in her mychart msg. I educated patient to hold her xeloda for 3 weeks as recommended by Dr. Smith Robert. Patient stated that she "more concerned about stopping the Xeloda temporarily than the risk of doing weekly fills on the expander."   I shared to her that Per Dr. Smith Robert, "Dr Konrad Penta wants to exchange her expander to implant due to leak. she asked to hold it for 1-3 weeks. I think that would be ok and not involve much of an interruption. we will try to hold the drug for as less time as possible based on when the procedure is done."  Patient gave verbal understanding and stated that she trusted Dr. Assunta Gambles opinion. She will hold her xeloda as instructed and also let us know when her procedure date would be, so we can be aware. We will notify patient when to start back on the xeloda after her procedure.   Of note- when she needs her next refill, she is currently in the middle of the cycle and has another week's worth of tablets of the xeloda.

## 2023-08-27 NOTE — Progress Notes (Signed)
 Talked with patient about doing the exchange sooner than lateral due to the left expander leak.  Patient would like to see if we can move forward with exchange.  I connected with  Eileen Hart on 08/27/23 by phone and verified that I am speaking with the correct person using two identifiers. We spent 5 min in discussion.  The patient was at home and I was at the office.    I discussed the limitations of evaluation and management by telemedicine. The patient expressed understanding and agreed to proceed.

## 2023-08-27 NOTE — Therapy (Signed)
 OUTPATIENT PHYSICAL THERAPY TREATMENT   Patient Name: Eileen Hart MRN: [id number] DOB:1991-02-13, 33 y.o., female Today's Date: 08/27/2023  END OF SESSION:   PT End of Session - 08/27/23 0846     Visit Number 5    Number of Visits 25    Date for PT Re-Evaluation 10/30/23    PT Start Time 0848    PT Stop Time 0927    PT Time Calculation (min) 39 min    Activity Tolerance Patient tolerated treatment well    Behavior During Therapy [facility] for tasks assessed/performed               Past Medical History:  Diagnosis Date   Chlamydia 01/25/2010   Depression with anxiety    Gestational hypertension    Immunization, viral disease    gardasil series completed   Invasive carcinoma of breast (HCC)    Tobacco abuse 05/02/2016   Past Surgical History:  Procedure Laterality Date   AUGMENTATION MAMMAPLASTY Bilateral    saline implants 2015   BREAST BIOPSY Right 10/14/2022   US biopsy/ coil clip/ path pending   BREAST BIOPSY Right 10/14/2022   [place] RT BREAST BX W LOC DEV 1ST LESION IMG BX SPEC US GUIDE 10/14/2022 [facility]   BREAST BIOPSY Left 10/29/2022   [place] Core Bx Ribbon clip- path pending   BREAST BIOPSY Right 10/29/2022   [place] Core 1130 Venus Clip- path pending   BREAST BIOPSY Right 10/29/2022   [place] Core Bx Retroareolar heart clip-path pending   BREAST BIOPSY Right 10/29/2022   [place] Core Bx Ribbon Clip path pending   BREAST BIOPSY Right 10/29/2022   [place] RT BREAST BX W LOC DEV EA ADD LESION IMG BX SPEC US GUIDE 10/29/2022 [facility]   BREAST BIOPSY Right 10/29/2022   [place] RT BREAST BX W LOC DEV 1ST LESION IMG BX SPEC US GUIDE 10/29/2022 [facility]   BREAST BIOPSY Right 10/29/2022   [place] RT BREAST BX W LOC DEV EA ADD LESION IMG BX SPEC US GUIDE 10/29/2022 [facility]   BREAST BIOPSY Left 10/29/2022   [place] LT BREAST BX W LOC DEV 1ST LESION IMG BX SPEC US GUIDE 10/29/2022 [facility]   BREAST ENHANCEMENT SURGERY  2015   BREAST RECONSTRUCTION WITH PLACEMENT OF TISSUE  EXPANDER AND FLEX HD (ACELLULAR HYDRATED DERMIS) Bilateral 05/08/2023   Procedure: Bilateral immediate breast reconstruction with expanders and Flex HD placement;  Surgeon: Peggye Form, DO;  Location: [facility];  Service: Plastics;  Laterality: Bilateral;   CESAREAN SECTION  2014   MASTECTOMY W/ SENTINEL NODE BIOPSY Bilateral 05/08/2023   Procedure: MASTECTOMY Simple WITH SENTINEL LYMPH NODE BIOPSY, RNFA to assist;  Surgeon: Leafy Ro, MD;  Location: [facility];  Service: General;  Laterality: Bilateral;   PORTACATH PLACEMENT N/A 10/24/2022   Procedure: INSERTION PORT-A-CATH;  Surgeon: Leafy Ro, MD;  Location: [facility];  Service: General;  Laterality: N/A;   TONSILLECTOMY     Patient Active Problem List   Diagnosis Date Noted   S/P bilateral mastectomy 05/08/2023   Genetic testing 10/30/2022   Invasive carcinoma of breast (HCC) 10/22/2022   Malignant neoplasm of upper-outer quadrant of right breast in female, estrogen receptor negative (HCC) 10/22/2022   Lump of right breast 09/18/2022   Weight gain 12/04/2020   Right wrist pain 09/27/2019   Contraceptive management 07/07/2019   Anxiety and depression 12/29/2015    PCP: Glori Luis, MD  REFERRING PROVIDER: Scheeler, Kermit Balo, PA-C   REFERRING DIAG:  Diagnosis  C50.411,Z17.1 (ICD-10-CM) - Malignant neoplasm of upper-outer quadrant of right breast in female, estrogen receptor negative (HCC)  C50.919 (ICD-10-CM) - Invasive carcinoma of breast (HCC)  Z98.890 (ICD-10-CM) - S/P breast reconstruction   Referral note: "Recovery after bilateral mastectomy and bilateral breast reconstruction 04/2023. Has expanders in place. Patient is noticing restricted ROM. Need eval for possible lymphedema as well.   Thank you all for your help."   Rationale for Evaluation and Treatment: Rehabilitation  THERAPY DIAG:  Stiffness of right shoulder, not elsewhere classified  Muscle weakness (generalized)  ONSET DATE:  04/2023   SUBJECTIVE:                                                                                                                                                                                           SUBJECTIVE STATEMENT: Pt felt some soreness in chest following last visit, feels OK now.   Initial Eval: The pt is a pleasant 33 y/o female, hx of breast CA, presenting to PT s/p bilateral mastectomy with bilateral breast reconstruction 05/08/2023 with expanders in place. Pt would like to address residual RUE pain, decreased ROM and strength. Pt reports pain travels from R armpit to wrist. She has difficulty fully straightening her R arm, which is impacting her ability to reach, dress, and push or pull with this arm. She is R handed. Pt also notes tightness and pressure in her R shoulder. Her strength is not what it used to be. She experiences tingling/numbness in RUE after using her phone for a while. 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 Pt reports no current activity restrictions she is aware of, but is not lifting items over 40#. She still has expanders in. Currently on chemo pill. Additionally, reports hx of R wrist tendon pain years ago, feels it every once in a while. 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 Pt has 66 y/o son.    PERTINENT HISTORY:   Pt underwent bilateral mastectomy and bilateral breast reconstruction 05/08/2023. Pt has expanders in place.  Per oncology "Patient...who underwent a bilateral diagnostic mammogram on 10/07/2022 after she felt a palpable area of concern in her right breast. Mammogram showed a 1.6 x 1 x 1.5 cm solid mass in the right breast 9:30 position 5 cm from the nipple. There was also an adjacent 6 x 9 x 2 mm mass in the right breast. No right axillary adenopathy. The dominant mass was biopsied and was consistent with invasive mammary carcinoma grade 3 ER and HER2 negative. "  Per MD  note  on 07/22/2023: "She had bilateral tissue expanders placed on 05/08/2023 with Dr. Ulice Bold. Of note, expanders were placed in the subpectoral plane. We have discussed that she will not be able to have expander to implant exchange until late June early July due to current chemotherapy regimen.Marland KitchenMarland KitchenMarland KitchenOn exam bilateral breast incisions are intact, well-healed.Marland KitchenMarland KitchenMarland KitchenBilateral tissue expanders are in place, no subcutaneous fluid collection noted palpation."  Other PMH per chart consists of tobacco use, depression with anxiety  PAIN:  Are you having pain?  Avg pain: 2-4/10 worst: 4-5/10; at rest it can be 0/10 Describes pain as sharp & tight, "it's almost like there's a cord in my arm"   PRECAUTIONS:  Pt reports no current activity restrictions she is aware of, but is not lifting items over 40#.  RED FLAGS: Pt currently being treated for breast CA, no other red flags per report     WEIGHT BEARING RESTRICTIONS:  No  FALLS:  Has patient fallen in last 6 months? No  LIVING ENVIRONMENT: Lives with: lives with their son 56 y/o  Lives in: House/apartment  OCCUPATION:   [employer] dogs, has not been able to fully return to work   PLOF:  Independent  PATIENT GOALS:  Improve RUE strength, ROM, function  NEXT MD VISIT:    OBJECTIVE:  Note: Objective measures were completed at Evaluation unless otherwise noted.  DIAGNOSTIC FINDINGS:  Please refer to chart for past imaging reports PATIENT SURVEYS:  Quick Dash 34.1  COGNITIVE STATUS: Within functional limits for tasks assessed   SENSATION: WNL UE   Coordination: Rapid alt UE is WNL   EDEMA: OT lymphedema referral request being placed to address swelling of RUE    POSTURE:  No Significant postural limitations  HAND DOMINANCE:  Right  Body Part #1 Shoulder  PALPATION: TrP noted R UT, none currently, but pt reports she tends to be TTP near R axilla   MMT (strength testing): Gross RUE is 4/5 Gross LUE strength is  4+/5 Grip strength 4+/5 bilat   AROM BUE: Shoulder Flexion: RUE: 135 deg and pain-limited LUE: 150 deg  Shoulder abduction: RUE: 119 deg and pain-limited Left: 137 deg  Shoulder ER:  RUE: 65 deg LUE: 60 deg - slight tightness but no pain   Shoulder IR: no formal measurement taken but observed WNL bilat  Elbow ROM: Flexion WNL bilat  Extension:  Lacking 10 deg RUE Able to achieve 0 deg LUE                                                                                                                            TREATMENT DATE: 08/27/2023    TE: Seated shoulder flexion AROM for endurance  2x10 Seated shoulder abduction AROM for endurance 2x10 Seated shoulder scaption AROM for endurance 2x10 Seated shoulder rolls/circles cw/cc 10x for each  Scapular squeezes 10x with 1-2 sec hold/rep   Pendulums 10x cw/cc RUE   Seated shoulder flexion 1 x 15, 1x10 each UE with 2#  DB  Seated shoulder abduction 1 x 15, 1x10 each UE with 2# DB Seated shoulder ER 2 x 20 each with RTB - rates medium  Wall push-ups 2x15 BUE - easy-medium  Supine on plinth: Unilat chest press with 2# dumbbell 2x15 each UE - somewhat easy on R, more medium on L  2# bar shoulder flex to assist into increased shoulder flex 2x15  Side-lye shoulder ER R 2x10   Body blade bilat UE 2 x 30 sec   -RUE only x 30 sec   RUE shoulder ext isometric into table 3x10 with 3-5 sec hold/rep   Supine bicep curl 2ith 2# dumbbell 3x10 Supine RTB RUE tricep ext  3x10     PATIENT EDUCATION:  Education details: Pt educated throughout session about proper posture and technique with exercises. Improved exercise technique, movement at target joints, use of target muscles after min to mod verbal, visual, tactile cues.  Person educated: Patient Education method: Explanation, Demonstration, and Verbal cues Education comprehension: verbalized understanding and returned demonstration  HOME EXERCISE PROGRAM: Access Code:  [email or web address] URL: [email or web address] Date: 08/18/2023 Prepared by: Casimiro Needle  Exercises - Seated Elbow Extension with Self-Anchored Resistance  - 1 x daily - 5-7 x weekly - 2 sets - 15 reps - Shoulder External Rotation with Resistance  - 1 x daily - 7 x weekly - 2 sets - 15 reps - Standing Shoulder Internal Rotation with Anchored Resistance  - 1 x daily - 7 x weekly - 2 sets - 15 reps - Scapular Retraction with Resistance  - 1 x daily - 7 x weekly - 2 sets - 15 reps   ASSESSMENT:  CLINICAL IMPRESSION:   Pt continues to tolerate higher level volume of therex/activities. While pt making gains, she still exhibits a mm endurance deficit in bilat UE. Patient will benefit from further skilled PT to address impairments in order to decrease pain, and improve strength, ROM, ADL ability and QOL to return pt to PLOF.  OBJECTIVE IMPAIRMENTS: decreased mobility, decreased ROM, decreased strength, hypomobility, increased edema, increased fascial restrictions, impaired flexibility, impaired sensation, impaired UE functional use, and pain.   ACTIVITY LIMITATIONS: carrying, lifting, sleeping, bathing, dressing, and reach over head  PARTICIPATION LIMITATIONS: meal prep, cleaning, shopping, community activity, occupation, and yard work  PERSONAL FACTORS: Sex and 1-2 comorbidities: depression, anxiety  are also affecting patient's functional outcome.   REHAB POTENTIAL: Good  CLINICAL DECISION MAKING: Evolving/moderate complexity  EVALUATION COMPLEXITY: Moderate   GOALS: Goals reviewed with patient? Yes  SHORT TERM GOALS: Target date: 09/18/2023    Patient will be independent in home exercise program to improve strength/mobility for better functional independence with ADLs. Baseline: initiated Goal status: INITIAL   LONG TERM GOALS: Target date: 10/30/2023   Patient will increase BUE gross strength to 5/5 as to improve functional strength for ADL ability. Baseline: Gross RUE  is 4/5; Gross LUE strength is 4+/5 Goal status: INITIAL  2.  The pt will report a worst RUE pain of no greater than 2/10 over the past 2 weeks to indicate increased QOL. Baseline:  4-5/10 Goal status: INITIAL  3.  Patient will improve RUE shoulder AROM to > 140 degrees of pain-free flexion and abduction for improved ability to perform overhead activities. Baseline: flexion 135 deg and pain-limited, abduction 119 deg and pain-limited  Goal status: INITIAL  4.   Patient will decrease Quick DASH score by > 8 points demonstrating reduced self-reported upper extremity disability. Baseline: 34.1 Goal status: INITIAL   PLAN:  PT FREQUENCY: 1-2x/week  PT DURATION: 12 weeks  PLANNED INTERVENTIONS: 97164- PT Re-evaluation, 97110-Therapeutic exercises, 97530- Therapeutic activity, [id number]- Neuromuscular re-education, 97535- Self Care, 97140- Manual therapy, [phone]- Gait training, [phone]- Orthotic Fit/training, [phone]- Canalith repositioning, [id number]- Splinting, [phone]- Electrical stimulation (manual), Patient/Family education, Balance training, Stair training, Taping, Dry Needling, Joint mobilization, Spinal mobilization, Scar mobilization, DME instructions, Cryotherapy, and Moist heat.  PLAN FOR NEXT SESSION:  - progressive B UE strength training within pt's tolerance  - focus on progressing to strengthening exercises with arm extended positioning     Temple Pacini PT, DPT   Physical Therapist - [facility]  [facility]  10:52 AM 08/27/23

## 2023-08-27 NOTE — Telephone Encounter (Signed)
 Rn spoke with patient- see phone note

## 2023-08-28 ENCOUNTER — Ambulatory Visit: Admitting: Surgical

## 2023-08-28 ENCOUNTER — Encounter: Payer: Self-pay | Admitting: Surgical

## 2023-08-28 VITALS — BP 120/81 | HR 90 | Ht 63.0 in | Wt 114.6 lb

## 2023-08-28 DIAGNOSIS — C50411 Malignant neoplasm of upper-outer quadrant of right female breast: Secondary | ICD-10-CM

## 2023-08-28 DIAGNOSIS — Z171 Estrogen receptor negative status [ER-]: Secondary | ICD-10-CM

## 2023-08-28 DIAGNOSIS — C50919 Malignant neoplasm of unspecified site of unspecified female breast: Secondary | ICD-10-CM

## 2023-08-28 DIAGNOSIS — Z9889 Other specified postprocedural states: Secondary | ICD-10-CM

## 2023-08-28 MED ORDER — ONDANSETRON HCL 4 MG PO TABS: 4.0000 mg | ORAL_TABLET | Freq: Three times a day (TID) | ORAL | 0 refills | Status: DC | PRN

## 2023-08-28 MED ORDER — DIAZEPAM 2 MG PO TABS: 2.0000 mg | ORAL_TABLET | Freq: Two times a day (BID) | ORAL | 0 refills | Status: DC | PRN

## 2023-08-28 MED ORDER — CEPHALEXIN 500 MG PO CAPS
500.0000 mg | ORAL_CAPSULE | Freq: Four times a day (QID) | ORAL | 0 refills | Status: AC
Start: 2023-08-28 — End: 2023-09-02

## 2023-08-28 MED ORDER — OXYCODONE HCL 5 MG PO TABS: 5.0000 mg | ORAL_TABLET | Freq: Four times a day (QID) | ORAL | 0 refills | Status: DC | PRN

## 2023-08-28 NOTE — H&P (View-Only) (Signed)
 Patient ID: Eileen Hart, female    DOB: 07/15/1990, 33 y.o.   MRN: [id number]  Chief Complaint  Patient presents with   pre op      ICD-10-CM   1. Malignant neoplasm of upper-outer quadrant of right breast in female, estrogen receptor negative (HCC)  C50.411    Z17.1     2. Invasive carcinoma of breast (HCC)  C50.919     3. S/P breast reconstruction  Z98.890      History of Present Illness: Eileen Hart is a 33 y.o.  female  with a history of bilateral breast reconstruction after bilateral mastectomy..  She presents for preoperative evaluation for upcoming procedure, removal of bilateral breast tissue expanders and placement of bilateral breast implants-silicone, scheduled for 09/08/2023 with Dr. Ulice Bold.  The patient has not had problems with anesthesia. No history of DVT/PE.  No family history of DVT/PE.  No family or personal history of bleeding or clotting disorders.  Patient is not currently taking any blood thinners.  No history of CVA/MI.   Summary of Previous Visit: Patient currently has 430 cc out of 455 cc in the right breast tissue expander.  She does have a leak of the left breast tissue expander.  Dr. Ulice Bold recommends exchanging expanders for implants at this time due to the left breast expander being ruptured.  Patient is comfortable with the current size.  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  PMH Significant for: Breast augmentation, bilateral mastectomy with breast reconstruction with tissue expanders in place.  History of invasive breast cancer  Patient reports she is feeling well today, no recent changes to her health.  She denies any cardiac or pulmonary disease.  She reports she has spoke with oncology and has stopped Xeloda as of yesterday.  She reports she has not been taking ibuprofen.  She does not have any specific concerns.  She does report that she is interested in high or ultrahigh profile implants, she reports she had a high  profile before.     Past Medical History: Allergies: No Known Allergies  Current Medications:  Current Outpatient Medications:    acetaminophen (TYLENOL) 500 MG tablet, Take 1,000 mg by mouth every 6 (six) hours as needed for moderate pain (pain score 4-6)., Disp: , Rfl:    cephALEXin (KEFLEX) 500 MG capsule, Take 1 capsule (500 mg total) by mouth 4 (four) times daily for 5 days., Disp: 20 capsule, Rfl: 0   diazepam (VALIUM) 2 MG tablet, Take 1 tablet (2 mg total) by mouth every 12 (twelve) hours as needed for muscle spasms., Disp: 5 tablet, Rfl: 0   diclofenac Sodium (VOLTAREN) 1 % GEL, Research Patient: Apply 0.5 grams (1 fingertip) to each hand and each foot twice daily for up to 12 weeks, Disp: 400 g, Rfl: 0   gabapentin (NEURONTIN) 300 MG capsule, Take 1 capsule (300 mg total) by mouth 3 (three) times daily., Disp: 90 capsule, Rfl: 3   ibuprofen (ADVIL) 200 MG tablet, Take 600-800 mg by mouth every 6 (six) hours as needed for moderate pain (pain score 4-6)., Disp: , Rfl:    ondansetron (ZOFRAN) 4 MG tablet, Take 1 tablet (4 mg total) by mouth every 8 (eight) hours as needed for nausea or vomiting., Disp: 60 tablet, Rfl: 2   ondansetron (ZOFRAN) 4 MG tablet, Take 1 tablet (4 mg total) by mouth every 8 (eight) hours as needed for nausea or vomiting., Disp: 20 tablet, Rfl: 0  oxyCODONE (OXY IR/ROXICODONE) 5 MG immediate release tablet, Take 1 tablet (5 mg total) by mouth every 6 (six) hours as needed for up to 10 doses for severe pain (pain score 7-10)., Disp: 10 tablet, Rfl: 0   capecitabine (XELODA) 500 MG tablet, Take 3 tablets (1,500 mg total) by mouth 2 (two) times daily after a meal. Take for 14 days, then hold for 7 days. Repeat every 21 days. (Patient not taking: Reported on 08/28/2023), Disp: 84 tablet, Rfl: 1  Past Medical Problems: Past Medical History:  Diagnosis Date   Chlamydia 01/25/2010   Depression with anxiety    Gestational hypertension    Immunization, viral disease     gardasil series completed   Invasive carcinoma of breast (HCC)    Tobacco abuse 05/02/2016    Past Surgical History: Past Surgical History:  Procedure Laterality Date   AUGMENTATION MAMMAPLASTY Bilateral    saline implants 2015   BREAST BIOPSY Right 10/14/2022   US biopsy/ coil clip/ path pending   BREAST BIOPSY Right 10/14/2022   [place] RT BREAST BX W LOC DEV 1ST LESION IMG BX SPEC US GUIDE 10/14/2022 [facility]   BREAST BIOPSY Left 10/29/2022   [place] Core Bx Ribbon clip- path pending   BREAST BIOPSY Right 10/29/2022   [place] Core 1130 Venus Clip- path pending   BREAST BIOPSY Right 10/29/2022   [place] Core Bx Retroareolar heart clip-path pending   BREAST BIOPSY Right 10/29/2022   [place] Core Bx Ribbon Clip path pending   BREAST BIOPSY Right 10/29/2022   [place] RT BREAST BX W LOC DEV EA ADD LESION IMG BX SPEC US GUIDE 10/29/2022 [facility]   BREAST BIOPSY Right 10/29/2022   [place] RT BREAST BX W LOC DEV 1ST LESION IMG BX SPEC US GUIDE 10/29/2022 [facility]   BREAST BIOPSY Right 10/29/2022   [place] RT BREAST BX W LOC DEV EA ADD LESION IMG BX SPEC US GUIDE 10/29/2022 [facility]   BREAST BIOPSY Left 10/29/2022   [place] LT BREAST BX W LOC DEV 1ST LESION IMG BX SPEC US GUIDE 10/29/2022 [facility]   BREAST ENHANCEMENT SURGERY  2015   BREAST RECONSTRUCTION WITH PLACEMENT OF TISSUE EXPANDER AND FLEX HD (ACELLULAR HYDRATED DERMIS) Bilateral 05/08/2023   Procedure: Bilateral immediate breast reconstruction with expanders and Flex HD placement;  Surgeon: Peggye Form, DO;  Location: [facility];  Service: Plastics;  Laterality: Bilateral;   CESAREAN SECTION  2014   MASTECTOMY W/ SENTINEL NODE BIOPSY Bilateral 05/08/2023   Procedure: MASTECTOMY Simple WITH SENTINEL LYMPH NODE BIOPSY, RNFA to assist;  Surgeon: Leafy Ro, MD;  Location: [facility];  Service: General;  Laterality: Bilateral;   PORTACATH PLACEMENT N/A 10/24/2022   Procedure: INSERTION PORT-A-CATH;  Surgeon: Leafy Ro, MD;  Location:  [facility];  Service: General;  Laterality: N/A;   TONSILLECTOMY      Social History: Social History   Socioeconomic History   Marital status: Single    Spouse name: Not on file   Number of children: 1   Years of education: 15   Highest education level: Not on file  Occupational History   Occupation: Groomer    Comment: [employer]  Tobacco Use   Smoking status: Former    Current packs/day: 0.00    Types: Cigarettes    Quit date: 07/25/2018    Years since quitting: 5.0    Passive exposure: Past   Smokeless tobacco: Never   Tobacco comments:    quit 07/2018  Vaping Use   Vaping status:  Some Days  Substance and Sexual Activity   Alcohol use: No    Alcohol/week: 0.0 standard drinks of alcohol   Drug use: No   Sexual activity: Yes    Birth control/protection: Pill  Other Topics Concern   Not on file  Social History Narrative   Not on file   Social Drivers of Health   Financial Resource Strain: Not on file  Food Insecurity: No Food Insecurity (05/08/2023)   Hunger Vital Sign    Worried About Running Out of Food in the Last Year: Never true    Ran Out of Food in the Last Year: Never true  Transportation Needs: No Transportation Needs (05/08/2023)   PRAPARE - Administrator, Civil Service (Medical): No    Lack of Transportation (Non-Medical): No  Physical Activity: Not on file  Stress: Not on file  Social Connections: Not on file  Intimate Partner Violence: Not At Risk (05/08/2023)   Humiliation, Afraid, Rape, and Kick questionnaire    Fear of Current or Ex-Partner: No    Emotionally Abused: No    Physically Abused: No    Sexually Abused: No    Family History: Family History  Problem Relation Age of Onset   Breast cancer Other 35       mat 2nd cousin   Diabetes Neg Hx    Heart disease Neg Hx    Hypertension Neg Hx    Ovarian cancer Neg Hx    Colon cancer Neg Hx     Review of Systems: Review of Systems  Constitutional: Negative.    Respiratory: Negative.    Cardiovascular: Negative.   Gastrointestinal: Negative.   Neurological: Negative.     Physical Exam: Vital Signs BP 120/81 (BP Location: Left Arm, Patient Position: Sitting, Cuff Size: Normal)   Pulse 90   Ht 5\' 3"  (1.6 m)   Wt 114 lb 9.6 oz (52 kg)   SpO2 98%   BMI 20.30 kg/m   Physical Exam Constitutional:      General: Not in acute distress.    Appearance: Normal appearance. Not ill-appearing.  HENT:     Head: Normocephalic and atraumatic.  Eyes:     Pupils: Pupils are equal, round Neck:     Musculoskeletal: Normal range of motion.  Cardiovascular:     Rate and Rhythm: Normal rate    Pulses: Normal pulses.  Pulmonary:     Effort: Pulmonary effort is normal. No respiratory distress.  Abdominal:     General: Abdomen is flat. There is no distension.  Musculoskeletal: Normal range of motion.  Skin:    General: Skin is warm and dry.     Findings: No erythema or rash.  Neurological:     General: No focal deficit present.     Mental Status: Alert and oriented to person, place, and time. Mental status is at baseline.     Motor: No weakness.  Psychiatric:        Mood and Affect: Mood normal.        Behavior: Behavior normal.    Assessment/Plan: The patient is scheduled for exchange of bilateral breast tissue expanders for silicone breast implants with Dr. Ulice Bold.  Risks, benefits, and alternatives of procedure discussed, questions answered and consent obtained.    Smoking Status: Non-smoker; Counseling Given?  N/A  Caprini Score: 6; Risk Factors include: Right breast cancer, Port-A-Cath in place, and length of planned surgery. Recommendation for mechanical prophylaxis. Encourage early ambulation.   Post-op Rx sent to pharmacy:  Oxycodone, Zofran, Keflex, Valium  Patient was provided with the General Surgical Risk consent document and Pain Medication Agreement prior to their appointment.  They had adequate time to read through the risk  consent documents and Pain Medication Agreement. We also discussed them in person together during this preop appointment. All of their questions were answered to their satisfaction.  Recommended calling if they have any further questions.  Risk consent form and Pain Medication Agreement to be scanned into patient's chart.  Patient was provided with the Mentor implant patient decision checklist and this was completed during today's preoperative evaluation. Patient had time to read through the information and any questions were answered to their content. Form will be scanned into patient's chart.  The risks that can be encountered with and after placement of a breast implant were discussed and include the following but not limited to these: bleeding, infection, delayed healing, anesthesia risks, skin sensation changes, injury to structures including nerves, blood vessels, and muscles which may be temporary or permanent, allergies to tape, suture materials and glues, blood products, topical preparations or injected agents, skin contour irregularities, skin discoloration and swelling, deep vein thrombosis, cardiac and pulmonary complications, pain, which may persist, fluid accumulation, wrinkling of the skin over the implanmt, changes in nipple or breast sensation, implant leakage or rupture, faulty position of the implant, persistent pain, formation of tight scar tissue around the implant (capsular contracture).  Patient is going to hold Xeloda prior to surgery and will restart after surgery at the recommendation of oncology.  Will plan to see patient next week on Thursday for expander fill prior to surgery.  Electronically signed by: Kermit Balo Adamarys Shall, PA-C 08/28/2023 3:17 PM

## 2023-08-28 NOTE — Progress Notes (Signed)
 Patient ID: Eileen Hart, female    DOB: 07/15/1990, 33 y.o.   MRN: [id number]  Chief Complaint  Patient presents with   pre op      ICD-10-CM   1. Malignant neoplasm of upper-outer quadrant of right breast in female, estrogen receptor negative (HCC)  C50.411    Z17.1     2. Invasive carcinoma of breast (HCC)  C50.919     3. S/P breast reconstruction  Z98.890      History of Present Illness: Eileen Hart is a 33 y.o.  female  with a history of bilateral breast reconstruction after bilateral mastectomy..  She presents for preoperative evaluation for upcoming procedure, removal of bilateral breast tissue expanders and placement of bilateral breast implants-silicone, scheduled for 09/08/2023 with Dr. Ulice Bold.  The patient has not had problems with anesthesia. No history of DVT/PE.  No family history of DVT/PE.  No family or personal history of bleeding or clotting disorders.  Patient is not currently taking any blood thinners.  No history of CVA/MI.   Summary of Previous Visit: Patient currently has 430 cc out of 455 cc in the right breast tissue expander.  She does have a leak of the left breast tissue expander.  Dr. Ulice Bold recommends exchanging expanders for implants at this time due to the left breast expander being ruptured.  Patient is comfortable with the current size.  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  PMH Significant for: Breast augmentation, bilateral mastectomy with breast reconstruction with tissue expanders in place.  History of invasive breast cancer  Patient reports she is feeling well today, no recent changes to her health.  She denies any cardiac or pulmonary disease.  She reports she has spoke with oncology and has stopped Xeloda as of yesterday.  She reports she has not been taking ibuprofen.  She does not have any specific concerns.  She does report that she is interested in high or ultrahigh profile implants, she reports she had a high  profile before.     Past Medical History: Allergies: No Known Allergies  Current Medications:  Current Outpatient Medications:    acetaminophen (TYLENOL) 500 MG tablet, Take 1,000 mg by mouth every 6 (six) hours as needed for moderate pain (pain score 4-6)., Disp: , Rfl:    cephALEXin (KEFLEX) 500 MG capsule, Take 1 capsule (500 mg total) by mouth 4 (four) times daily for 5 days., Disp: 20 capsule, Rfl: 0   diazepam (VALIUM) 2 MG tablet, Take 1 tablet (2 mg total) by mouth every 12 (twelve) hours as needed for muscle spasms., Disp: 5 tablet, Rfl: 0   diclofenac Sodium (VOLTAREN) 1 % GEL, Research Patient: Apply 0.5 grams (1 fingertip) to each hand and each foot twice daily for up to 12 weeks, Disp: 400 g, Rfl: 0   gabapentin (NEURONTIN) 300 MG capsule, Take 1 capsule (300 mg total) by mouth 3 (three) times daily., Disp: 90 capsule, Rfl: 3   ibuprofen (ADVIL) 200 MG tablet, Take 600-800 mg by mouth every 6 (six) hours as needed for moderate pain (pain score 4-6)., Disp: , Rfl:    ondansetron (ZOFRAN) 4 MG tablet, Take 1 tablet (4 mg total) by mouth every 8 (eight) hours as needed for nausea or vomiting., Disp: 60 tablet, Rfl: 2   ondansetron (ZOFRAN) 4 MG tablet, Take 1 tablet (4 mg total) by mouth every 8 (eight) hours as needed for nausea or vomiting., Disp: 20 tablet, Rfl: 0  oxyCODONE (OXY IR/ROXICODONE) 5 MG immediate release tablet, Take 1 tablet (5 mg total) by mouth every 6 (six) hours as needed for up to 10 doses for severe pain (pain score 7-10)., Disp: 10 tablet, Rfl: 0   capecitabine (XELODA) 500 MG tablet, Take 3 tablets (1,500 mg total) by mouth 2 (two) times daily after a meal. Take for 14 days, then hold for 7 days. Repeat every 21 days. (Patient not taking: Reported on 08/28/2023), Disp: 84 tablet, Rfl: 1  Past Medical Problems: Past Medical History:  Diagnosis Date   Chlamydia 01/25/2010   Depression with anxiety    Gestational hypertension    Immunization, viral disease     gardasil series completed   Invasive carcinoma of breast (HCC)    Tobacco abuse 05/02/2016    Past Surgical History: Past Surgical History:  Procedure Laterality Date   AUGMENTATION MAMMAPLASTY Bilateral    saline implants 2015   BREAST BIOPSY Right 10/14/2022   US biopsy/ coil clip/ path pending   BREAST BIOPSY Right 10/14/2022   [place] RT BREAST BX W LOC DEV 1ST LESION IMG BX SPEC US GUIDE 10/14/2022 [facility]   BREAST BIOPSY Left 10/29/2022   [place] Core Bx Ribbon clip- path pending   BREAST BIOPSY Right 10/29/2022   [place] Core 1130 Venus Clip- path pending   BREAST BIOPSY Right 10/29/2022   [place] Core Bx Retroareolar heart clip-path pending   BREAST BIOPSY Right 10/29/2022   [place] Core Bx Ribbon Clip path pending   BREAST BIOPSY Right 10/29/2022   [place] RT BREAST BX W LOC DEV EA ADD LESION IMG BX SPEC US GUIDE 10/29/2022 [facility]   BREAST BIOPSY Right 10/29/2022   [place] RT BREAST BX W LOC DEV 1ST LESION IMG BX SPEC US GUIDE 10/29/2022 [facility]   BREAST BIOPSY Right 10/29/2022   [place] RT BREAST BX W LOC DEV EA ADD LESION IMG BX SPEC US GUIDE 10/29/2022 [facility]   BREAST BIOPSY Left 10/29/2022   [place] LT BREAST BX W LOC DEV 1ST LESION IMG BX SPEC US GUIDE 10/29/2022 [facility]   BREAST ENHANCEMENT SURGERY  2015   BREAST RECONSTRUCTION WITH PLACEMENT OF TISSUE EXPANDER AND FLEX HD (ACELLULAR HYDRATED DERMIS) Bilateral 05/08/2023   Procedure: Bilateral immediate breast reconstruction with expanders and Flex HD placement;  Surgeon: Peggye Form, DO;  Location: [facility];  Service: Plastics;  Laterality: Bilateral;   CESAREAN SECTION  2014   MASTECTOMY W/ SENTINEL NODE BIOPSY Bilateral 05/08/2023   Procedure: MASTECTOMY Simple WITH SENTINEL LYMPH NODE BIOPSY, RNFA to assist;  Surgeon: Leafy Ro, MD;  Location: [facility];  Service: General;  Laterality: Bilateral;   PORTACATH PLACEMENT N/A 10/24/2022   Procedure: INSERTION PORT-A-CATH;  Surgeon: Leafy Ro, MD;  Location:  [facility];  Service: General;  Laterality: N/A;   TONSILLECTOMY      Social History: Social History   Socioeconomic History   Marital status: Single    Spouse name: Not on file   Number of children: 1   Years of education: 15   Highest education level: Not on file  Occupational History   Occupation: Groomer    Comment: [employer]  Tobacco Use   Smoking status: Former    Current packs/day: 0.00    Types: Cigarettes    Quit date: 07/25/2018    Years since quitting: 5.0    Passive exposure: Past   Smokeless tobacco: Never   Tobacco comments:    quit 07/2018  Vaping Use   Vaping status:  Some Days  Substance and Sexual Activity   Alcohol use: No    Alcohol/week: 0.0 standard drinks of alcohol   Drug use: No   Sexual activity: Yes    Birth control/protection: Pill  Other Topics Concern   Not on file  Social History Narrative   Not on file   Social Drivers of Health   Financial Resource Strain: Not on file  Food Insecurity: No Food Insecurity (05/08/2023)   Hunger Vital Sign    Worried About Running Out of Food in the Last Year: Never true    Ran Out of Food in the Last Year: Never true  Transportation Needs: No Transportation Needs (05/08/2023)   PRAPARE - Administrator, Civil Service (Medical): No    Lack of Transportation (Non-Medical): No  Physical Activity: Not on file  Stress: Not on file  Social Connections: Not on file  Intimate Partner Violence: Not At Risk (05/08/2023)   Humiliation, Afraid, Rape, and Kick questionnaire    Fear of Current or Ex-Partner: No    Emotionally Abused: No    Physically Abused: No    Sexually Abused: No    Family History: Family History  Problem Relation Age of Onset   Breast cancer Other 35       mat 2nd cousin   Diabetes Neg Hx    Heart disease Neg Hx    Hypertension Neg Hx    Ovarian cancer Neg Hx    Colon cancer Neg Hx     Review of Systems: Review of Systems  Constitutional: Negative.    Respiratory: Negative.    Cardiovascular: Negative.   Gastrointestinal: Negative.   Neurological: Negative.     Physical Exam: Vital Signs BP 120/81 (BP Location: Left Arm, Patient Position: Sitting, Cuff Size: Normal)   Pulse 90   Ht 5\' 3"  (1.6 m)   Wt 114 lb 9.6 oz (52 kg)   SpO2 98%   BMI 20.30 kg/m   Physical Exam Constitutional:      General: Not in acute distress.    Appearance: Normal appearance. Not ill-appearing.  HENT:     Head: Normocephalic and atraumatic.  Eyes:     Pupils: Pupils are equal, round Neck:     Musculoskeletal: Normal range of motion.  Cardiovascular:     Rate and Rhythm: Normal rate    Pulses: Normal pulses.  Pulmonary:     Effort: Pulmonary effort is normal. No respiratory distress.  Abdominal:     General: Abdomen is flat. There is no distension.  Musculoskeletal: Normal range of motion.  Skin:    General: Skin is warm and dry.     Findings: No erythema or rash.  Neurological:     General: No focal deficit present.     Mental Status: Alert and oriented to person, place, and time. Mental status is at baseline.     Motor: No weakness.  Psychiatric:        Mood and Affect: Mood normal.        Behavior: Behavior normal.    Assessment/Plan: The patient is scheduled for exchange of bilateral breast tissue expanders for silicone breast implants with Dr. Ulice Bold.  Risks, benefits, and alternatives of procedure discussed, questions answered and consent obtained.    Smoking Status: Non-smoker; Counseling Given?  N/A  Caprini Score: 6; Risk Factors include: Right breast cancer, Port-A-Cath in place, and length of planned surgery. Recommendation for mechanical prophylaxis. Encourage early ambulation.   Post-op Rx sent to pharmacy:  Oxycodone, Zofran, Keflex, Valium  Patient was provided with the General Surgical Risk consent document and Pain Medication Agreement prior to their appointment.  They had adequate time to read through the risk  consent documents and Pain Medication Agreement. We also discussed them in person together during this preop appointment. All of their questions were answered to their satisfaction.  Recommended calling if they have any further questions.  Risk consent form and Pain Medication Agreement to be scanned into patient's chart.  Patient was provided with the Mentor implant patient decision checklist and this was completed during today's preoperative evaluation. Patient had time to read through the information and any questions were answered to their content. Form will be scanned into patient's chart.  The risks that can be encountered with and after placement of a breast implant were discussed and include the following but not limited to these: bleeding, infection, delayed healing, anesthesia risks, skin sensation changes, injury to structures including nerves, blood vessels, and muscles which may be temporary or permanent, allergies to tape, suture materials and glues, blood products, topical preparations or injected agents, skin contour irregularities, skin discoloration and swelling, deep vein thrombosis, cardiac and pulmonary complications, pain, which may persist, fluid accumulation, wrinkling of the skin over the implanmt, changes in nipple or breast sensation, implant leakage or rupture, faulty position of the implant, persistent pain, formation of tight scar tissue around the implant (capsular contracture).  Patient is going to hold Xeloda prior to surgery and will restart after surgery at the recommendation of oncology.  Will plan to see patient next week on Thursday for expander fill prior to surgery.  Electronically signed by: Kermit Balo Adamarys Shall, PA-C 08/28/2023 3:17 PM

## 2023-08-29 ENCOUNTER — Other Ambulatory Visit: Payer: Self-pay

## 2023-08-29 ENCOUNTER — Telehealth: Payer: Self-pay

## 2023-08-29 NOTE — Telephone Encounter (Signed)
 Eileen Hart from [facility] called inquiring about a document she received that stated patient was going to be self pay. She wanted to know get details. I'm not sure who she needs to speak to. If you all have any idea I will call her back and inform her.   Thanks!

## 2023-08-30 NOTE — Therapy (Signed)
 OUTPATIENT OCCUPATIONAL THERAPY LYMPHEDEMA TREATMENT NOTE  Patient Name: Eileen Hart MRN: [id number] DOB:1990-07-29, 33 y.o., female Today's Date: 08/30/2023  END OF SESSION:   OT End of Session - 08/30/23 0917     Visit Number 2    Number of Visits 6    Date for OT Re-Evaluation 09/22/23    Authorization Type [place] MEDICAID PREPAID HEALTH PLAN    OT Start Time 0930    OT Stop Time 1015    OT Time Calculation (min) 45 min    Equipment Utilized During Treatment None    Activity Tolerance Patient tolerated treatment well    Behavior During Therapy WFL for tasks assessed/performed            Past Medical History:  Diagnosis Date   Chlamydia 01/25/2010   Depression with anxiety    Gestational hypertension    Immunization, viral disease    gardasil series completed   Invasive carcinoma of breast (HCC)    Tobacco abuse 05/02/2016   Past Surgical History:  Procedure Laterality Date   AUGMENTATION MAMMAPLASTY Bilateral    saline implants 2015   BREAST BIOPSY Right 10/14/2022   US biopsy/ coil clip/ path pending   BREAST BIOPSY Right 10/14/2022   [place] RT BREAST BX W LOC DEV 1ST LESION IMG BX SPEC US GUIDE 10/14/2022 [facility]   BREAST BIOPSY Left 10/29/2022   [place] Core Bx Ribbon clip- path pending   BREAST BIOPSY Right 10/29/2022   [place] Core 1130 Venus Clip- path pending   BREAST BIOPSY Right 10/29/2022   [place] Core Bx Retroareolar heart clip-path pending   BREAST BIOPSY Right 10/29/2022   [place] Core Bx Ribbon Clip path pending   BREAST BIOPSY Right 10/29/2022   [place] RT BREAST BX W LOC DEV EA ADD LESION IMG BX SPEC US GUIDE 10/29/2022 [facility]   BREAST BIOPSY Right 10/29/2022   [place] RT BREAST BX W LOC DEV 1ST LESION IMG BX SPEC US GUIDE 10/29/2022 [facility]   BREAST BIOPSY Right 10/29/2022   [place] RT BREAST BX W LOC DEV EA ADD LESION IMG BX SPEC US GUIDE 10/29/2022 [facility]   BREAST BIOPSY Left 10/29/2022   [place] LT BREAST BX W LOC DEV 1ST LESION IMG BX SPEC US GUIDE  10/29/2022 [facility]   BREAST ENHANCEMENT SURGERY  2015   BREAST RECONSTRUCTION WITH PLACEMENT OF TISSUE EXPANDER AND FLEX HD (ACELLULAR HYDRATED DERMIS) Bilateral 05/08/2023   Procedure: Bilateral immediate breast reconstruction with expanders and Flex HD placement;  Surgeon: Peggye Form, DO;  Location: [facility];  Service: Plastics;  Laterality: Bilateral;   CESAREAN SECTION  2014   MASTECTOMY W/ SENTINEL NODE BIOPSY Bilateral 05/08/2023   Procedure: MASTECTOMY Simple WITH SENTINEL LYMPH NODE BIOPSY, RNFA to assist;  Surgeon: Leafy Ro, MD;  Location: [facility];  Service: General;  Laterality: Bilateral;   PORTACATH PLACEMENT N/A 10/24/2022   Procedure: INSERTION PORT-A-CATH;  Surgeon: Leafy Ro, MD;  Location: [facility];  Service: General;  Laterality: N/A;   TONSILLECTOMY     Patient Active Problem List   Diagnosis Date Noted   S/P bilateral mastectomy 05/08/2023   Genetic testing 10/30/2022   Invasive carcinoma of breast (HCC) 10/22/2022   Malignant neoplasm of upper-outer quadrant of right breast in female, estrogen receptor negative (HCC) 10/22/2022   Lump of right breast 09/18/2022   Weight gain 12/04/2020   Right wrist pain 09/27/2019   Contraceptive management 07/07/2019   Anxiety and depression 12/29/2015   PCP: Dr. Birdie Sons (Pt  reports this MD just left the practice, but she will continue with this practice with another MD)  REFERRING PROVIDER: Keenan Bachelor, J, PA  REFERRING DIAG:  [phone] (ICD-10-CM) - Malignant neoplasm of upper-outer quadrant of right female breast  Z17.1 (ICD-10-CM) - Estrogen receptor negative status (ER-)  C50.919 (ICD-10-CM) - Malignant neoplasm of unspecified site of unspecified female breast  Z98.890 (ICD-10-CM) - Other specified postprocedural states   THERAPY DIAG:  Axillary web syndrome  S/P bilateral mastectomy  Lymphedema of right arm  Rationale for Evaluation and Treatment: Rehabilitation  ONSET DATE: Feb  1st 2025  SUBJECTIVE:                                                                                                                                                                                          SUBJECTIVE STATEMENT: Pt reports doing well today.  PERTINENT HISTORY: Per MEDICAL RECORD NUMBER Pt diagnosed with malignant neoplasm of upper-outer quadrant of right breast in female, estrogen receptor negative (HCC) 10/22/2022.  Pt is s/p bilat mastectomy and reconstruction with expanders in place 05/08/2023. Currently receiving p.o. chemo therapy with Xeloda, which she will finish early June.  PAIN: 08/27/23: ~3/10 RUE Are you having pain? None at rest; pain present with activity  NPRS scale: 5/10 Pain location: R volar arm from the shoulder to the wrist  Pain orientation: Right  PAIN TYPE: sharp, tight Pain description: intermittent/with activity Aggravating factors: extending the elbow/reaching into flexion and abd Relieving factors: rest   PRECAUTIONS: Lymphedema precautions: avoid needle sticks/BPs to RUE.  Pt is currently limiting herself to lifting no more than 40# at work.  Pt is currently undergoing chemotherapy p.o.  RED FLAGS: None   WEIGHT BEARING RESTRICTIONS: No  FALLS:  Has patient fallen in last 6 months? 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  OBJECTIVE: Note: Objective measures were completed at Evaluation unless otherwise noted.  COGNITION: Overall cognitive status: Within functional limits for tasks assessed   PALPATION: Mild cording R volar forearm, extending to upper arm and axilla.    OBSERVATIONS / OTHER ASSESSMENTS:  RUE: No visible swelling, but tenderness with palpation at the biceps and volar forearm.  Skin is taught throughout the RUE from the upper arm to the distal  forearm; non-pitting.  Mild cording R volar forearm, extending to upper arm and axilla; LUE: non-tender, supple  SENSATION: reports tingling in the ulnar nerve distribution of RUE only with prolonged positioning of elbow in flexion (when playing video games or holding phone).  POSTURE: No significant postural limitations  UPPER EXTREMITY AROM/PROM: (Per PT eval on 08/07/23)  AROM RIGHT   eval   Shoulder extension   Shoulder flexion 135  Shoulder abduction 119  Shoulder internal rotation Observed WNL  Shoulder external rotation 65  Elbow extension -10    (Blank rows = not tested)  A/PROM LEFT   eval  Shoulder extension   Shoulder flexion 150  Shoulder abduction 137  Shoulder internal rotation Observed WNL  Shoulder external rotation 60  Elbow extension 0    (Blank rows = not tested)  UPPER EXTREMITY STRENGTH: RUE grossly 4/5, LUE grossly 4+/5 (Per PT eval on 08/07/23)  LYMPHEDEMA ASSESSMENTS:   SURGERY TYPE/DATE: 05/08/2023 bilat mastectomy with reconstruction   NUMBER OF LYMPH NODES REMOVED: SLNB: 2 axillary lymph nodes removed   CHEMOTHERAPY: 16 rounds ending early/mid Oct 2024, now has a chemo pill 2x/day for 14 days, 1 week off for 8 cycles, ending in June  RADIATION: None  HORMONE TREATMENT: None  INFECTIONS: None  LYMPHEDEMA ASSESSMENTS:   UE circumferential measurements in cm  [place]mark R Date: 08/25/23 L Date: 08/25/23 R Date: R Date:  1  MP (distal base of SF) 18.7 17.9    2  Wrist crease 15.5 15.0    3 Forearm (*) +4 cm 15.0 14.5    4 * +4 cm 17.3 16.4    5 * +4 cm 20.0 18.8    6 * +4 cm 21.9 21.5    7 * +4 cm 22.8 22.7    8  Elbow crease 22.2 22.7    9 Upper arm (**) +4 cm 22.0 21.8    10 ** +4 cm 22.2 22.5    11 ** +4 cm 23.7 23.8    12 ** +4 cm 25.4 25.5                       Total circumferential cm 246.7 cm 243.1 cm      Total Limb  differential 3.6 cm  (R 1.5% larger than L; all  points of measure are <2 cm differential throughout RUE/LUE)        Change from initial        TODAY'S TREATMENT:                                                                                                                              DATE: 08/27/23 Manual Therapy: -Scar massage to R axilla; instructed in self massage technique -Soft tissue massage proximal>distal to target cording on volar side of RUE; instructed in self massage technique  Therapeutic Exercise: -Performed AWS exercise series; mod vc for technique and maintaining slow pace within  pain-free ROM.  Visual handout issued and reviewed.  PATIENT EDUCATION:  Education details: HEP; condition management/lymphedema precautions Person educated: Patient Education method: Explanation and Handouts Education comprehension: verbalized understanding  HOME EXERCISE PROGRAM: AWS exercises  ASSESSMENT:  CLINICAL IMPRESSION: Good tolerance to manual therapy and AWS exercise series this date.  Pt reports being less tender today in the RUE.  Pt shows good understanding of AWS exercises, with initial mod vc for technique and good understanding of visual handout.  Pt will continue to benefit skilled OT to provide instruction in self MLD technique, instruction in soft tissue massage and HEP to reduce/release cording in RUE, provide education on lymphedema condition management/prevention/precautions, and to obtain compression sleeve for maintaining RUE limb volume.  Pt in agreement with poc.  OBJECTIVE IMPAIRMENTS: decreased knowledge of condition, decreased knowledge of use of DME, decreased ROM, decreased strength, increased edema, increased fascial restrictions, impaired flexibility, impaired sensation, impaired UE functional use, and pain.   ACTIVITY LIMITATIONS: carrying, lifting, dressing, reach over head,  and caring for others  PARTICIPATION LIMITATIONS: occupation  PERSONAL FACTORS: 1-2 comorbidities: bilat mastectomy, reconstruction  are also affecting patient's functional outcome.   REHAB POTENTIAL: Excellent  CLINICAL DECISION MAKING: Evolving/moderate complexity  EVALUATION COMPLEXITY: Moderate  GOALS: Goals reviewed with patient? Yes  SHORT TERM GOALS: Target date: 09/15/23   1.  Pt will be indep to verbalize 2-3 lymphedema precautions to prevent infection and lymphedema flare ups. Baseline: Eval: Initiated educ on optimal skin care; further training needed Goal status: INITIAL  2.  Pt will be indep to verbalize 1-2 signs of lymphedema flare ups and identify subsequent action steps. Baseline: Eval: Educ not yet initiated. Goal status: INITIAL  LONG TERM GOALS: Target date: 09/22/23  Pt will be indep to perform HEP for reducing R AWS and promoting RUE lymphatic drainage with use of visual handouts. Baseline: Eval: HEP not yet initiated Goal status: INITIAL  2.  Pt will demonstrate self MLD technique with 75% accuracy using visual handouts as needed to promote lymphatic drainage throughout the RUE. Baseline: Eval: Instruction not yet initiated Goal status: INITIAL  3.  Pt will demonstrate soft tissue massage technique with 75% accuracy using visual/written handouts as needed to reduce/release R AWS in the RUE. Baseline: Eval: Not yet initiated. Goal status: INITIAL  4.  Pt will demonstrate independence with donning/doffing class 1 compression sleeve and gauntlet to the RUE. Baseline: Eval: Not yet obtained Goal status: INITIAL  5.  Pt will be demonstrate 75% or better adherence to recommended wearing schedule for compression sleeve/glove. Baseline: Eval: Not yet obtained/wearing schedule not yet reviewed Goal status: INITIAL  PLAN:  Patient presents with stage 0 R upper extremity lymphedema secondary to bilat mastectomy with SLNB, breast reconstruction surgery, and  axillary web syndrome.  Cording palpable from R distal forearm extending to R axilla.  Considering past medical and surgical hx, patient requires a class 1 RUE compression sleeve and a separate compression glove, quantity of 2 of each for wash and wear, d/t increased risk of development of lymphedema >stage   OT FREQUENCY: 1-2x/week  OT DURATION: 4 weeks  PLANNED INTERVENTIONS: 78469- OT Re-Evaluation, 97110-Therapeutic exercises, 97530- Therapeutic activity, 97535- Self Care, 62952- Manual therapy, 97760- Orthotic Fit/training, Patient/Family education, Joint mobilization, Manual lymph drainage, Scar mobilization, Compression bandaging, DME instructions, Cryotherapy, and Moist heat  PLAN FOR NEXT SESSION: manual therapy   Danelle Earthly, MS, OTR/L, CLT  O[employer] Dials, OT 08/30/2023, 9:19 AM

## 2023-09-01 ENCOUNTER — Ambulatory Visit: Payer: Medicaid Other

## 2023-09-01 ENCOUNTER — Encounter: Payer: Self-pay | Admitting: Oncology

## 2023-09-01 DIAGNOSIS — I890 Lymphedema, not elsewhere classified: Secondary | ICD-10-CM

## 2023-09-01 DIAGNOSIS — M25611 Stiffness of right shoulder, not elsewhere classified: Secondary | ICD-10-CM

## 2023-09-01 DIAGNOSIS — M25621 Stiffness of right elbow, not elsewhere classified: Secondary | ICD-10-CM

## 2023-09-01 DIAGNOSIS — M6281 Muscle weakness (generalized): Secondary | ICD-10-CM

## 2023-09-01 DIAGNOSIS — M79601 Pain in right arm: Secondary | ICD-10-CM

## 2023-09-01 DIAGNOSIS — Z9013 Acquired absence of bilateral breasts and nipples: Secondary | ICD-10-CM

## 2023-09-01 DIAGNOSIS — L7682 Other postprocedural complications of skin and subcutaneous tissue: Secondary | ICD-10-CM

## 2023-09-01 NOTE — Therapy (Signed)
 OUTPATIENT OCCUPATIONAL THERAPY LYMPHEDEMA TREATMENT NOTE  Patient Name: Eileen Hart MRN: [id number] DOB:Aug 26, 1990, 33 y.o., female Today's Date: 09/01/2023  END OF SESSION:   OT End of Session - 09/01/23 1155     Visit Number 3    Number of Visits 6    Date for OT Re-Evaluation 09/22/23    Authorization Type [place] MEDICAID PREPAID HEALTH PLAN    OT Start Time 0930    OT Stop Time 1015    OT Time Calculation (min) 45 min    Equipment Utilized During Treatment None    Activity Tolerance Patient tolerated treatment well    Behavior During Therapy WFL for tasks assessed/performed            Past Medical History:  Diagnosis Date   Chlamydia 01/25/2010   Depression with anxiety    Gestational hypertension    Immunization, viral disease    gardasil series completed   Invasive carcinoma of breast (HCC)    Tobacco abuse 05/02/2016   Past Surgical History:  Procedure Laterality Date   AUGMENTATION MAMMAPLASTY Bilateral    saline implants 2015   BREAST BIOPSY Right 10/14/2022   US biopsy/ coil clip/ path pending   BREAST BIOPSY Right 10/14/2022   [place] RT BREAST BX W LOC DEV 1ST LESION IMG BX SPEC US GUIDE 10/14/2022 [facility]   BREAST BIOPSY Left 10/29/2022   [place] Core Bx Ribbon clip- path pending   BREAST BIOPSY Right 10/29/2022   [place] Core 1130 Venus Clip- path pending   BREAST BIOPSY Right 10/29/2022   [place] Core Bx Retroareolar heart clip-path pending   BREAST BIOPSY Right 10/29/2022   [place] Core Bx Ribbon Clip path pending   BREAST BIOPSY Right 10/29/2022   [place] RT BREAST BX W LOC DEV EA ADD LESION IMG BX SPEC US GUIDE 10/29/2022 [facility]   BREAST BIOPSY Right 10/29/2022   [place] RT BREAST BX W LOC DEV 1ST LESION IMG BX SPEC US GUIDE 10/29/2022 [facility]   BREAST BIOPSY Right 10/29/2022   [place] RT BREAST BX W LOC DEV EA ADD LESION IMG BX SPEC US GUIDE 10/29/2022 [facility]   BREAST BIOPSY Left 10/29/2022   [place] LT BREAST BX W LOC DEV 1ST LESION IMG BX SPEC US GUIDE  10/29/2022 [facility]   BREAST ENHANCEMENT SURGERY  2015   BREAST RECONSTRUCTION WITH PLACEMENT OF TISSUE EXPANDER AND FLEX HD (ACELLULAR HYDRATED DERMIS) Bilateral 05/08/2023   Procedure: Bilateral immediate breast reconstruction with expanders and Flex HD placement;  Surgeon: Peggye Form, DO;  Location: [facility];  Service: Plastics;  Laterality: Bilateral;   CESAREAN SECTION  2014   MASTECTOMY W/ SENTINEL NODE BIOPSY Bilateral 05/08/2023   Procedure: MASTECTOMY Simple WITH SENTINEL LYMPH NODE BIOPSY, RNFA to assist;  Surgeon: Leafy Ro, MD;  Location: [facility];  Service: General;  Laterality: Bilateral;   PORTACATH PLACEMENT N/A 10/24/2022   Procedure: INSERTION PORT-A-CATH;  Surgeon: Leafy Ro, MD;  Location: [facility];  Service: General;  Laterality: N/A;   TONSILLECTOMY     Patient Active Problem List   Diagnosis Date Noted   S/P bilateral mastectomy 05/08/2023   Genetic testing 10/30/2022   Invasive carcinoma of breast (HCC) 10/22/2022   Malignant neoplasm of upper-outer quadrant of right breast in female, estrogen receptor negative (HCC) 10/22/2022   Lump of right breast 09/18/2022   Weight gain 12/04/2020   Right wrist pain 09/27/2019   Contraceptive management 07/07/2019   Anxiety and depression 12/29/2015   PCP: Dr. Birdie Sons (Pt  reports this MD just left the practice, but she will continue with this practice with another MD)  REFERRING PROVIDER: Keenan Bachelor, J, PA  REFERRING DIAG:  [phone] (ICD-10-CM) - Malignant neoplasm of upper-outer quadrant of right female breast  Z17.1 (ICD-10-CM) - Estrogen receptor negative status (ER-)  C50.919 (ICD-10-CM) - Malignant neoplasm of unspecified site of unspecified female breast  Z98.890 (ICD-10-CM) - Other specified postprocedural states   THERAPY DIAG:  Axillary web syndrome  S/P bilateral mastectomy  Lymphedema of right arm  Rationale for Evaluation and Treatment: Rehabilitation  ONSET DATE: Feb  1st 2025  SUBJECTIVE:                                                                                                                                                                                          SUBJECTIVE STATEMENT: Pt reports working on her AWS exercises at home since last visit and has no current questions re: her HEP.  Pt states that her surgery was moved up to next Monday (previously scheduled for July), and she will undergo removal of her spacers and have her implants put in that day. Accompanied by: Mother ([name]) and sister [name])  PERTINENT HISTORY: Per MEDICAL RECORD NUMBER Pt diagnosed with malignant neoplasm of upper-outer quadrant of right breast in female, estrogen receptor negative (HCC) 10/22/2022.  Pt is s/p bilat mastectomy and reconstruction with expanders in place 05/08/2023. Currently receiving p.o. chemo therapy with Xeloda, which she will finish early June.  PAIN: 09/01/23: 1-3/10 RUE Are you having pain? None at rest; pain present with activity  NPRS scale: 5/10 Pain location: R volar arm from the shoulder to the wrist  Pain orientation: Right  PAIN TYPE: sharp, tight Pain description: intermittent/with activity Aggravating factors: extending the elbow/reaching into flexion and abd Relieving factors: rest   PRECAUTIONS: Lymphedema precautions: avoid needle sticks/BPs to RUE.  Pt is currently limiting herself to lifting no more than 40# at work.  Pt is currently undergoing chemotherapy p.o.  RED FLAGS: None   WEIGHT BEARING RESTRICTIONS: No  FALLS:  Has patient fallen in last 6 months? 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  OBJECTIVE: Note: Objective measures were completed at Evaluation unless otherwise noted.  COGNITION: Overall cognitive status: Within  functional limits for tasks assessed   PALPATION: Mild cording R volar forearm, extending to upper arm and axilla.   OBSERVATIONS / OTHER ASSESSMENTS:  RUE: No visible swelling, but tenderness with palpation at the biceps and volar forearm.  Skin is taught throughout the RUE from the upper arm to the distal forearm; non-pitting.  Mild cording R volar forearm, extending to upper arm and axilla; LUE: non-tender, supple  SENSATION: reports tingling in the ulnar nerve distribution of RUE only with prolonged positioning of elbow in flexion (when playing video games or holding phone).  POSTURE: No significant postural limitations  UPPER EXTREMITY AROM/PROM: (Per PT eval on 08/07/23)  AROM RIGHT   eval   Shoulder extension   Shoulder flexion 135  Shoulder abduction 119  Shoulder internal rotation Observed WNL  Shoulder external rotation 65  Elbow extension -10    (Blank rows = not tested)  A/PROM LEFT   eval  Shoulder extension   Shoulder flexion 150  Shoulder abduction 137  Shoulder internal rotation Observed WNL  Shoulder external rotation 60  Elbow extension 0    (Blank rows = not tested)  UPPER EXTREMITY STRENGTH: RUE grossly 4/5, LUE grossly 4+/5 (Per PT eval on 08/07/23)  LYMPHEDEMA ASSESSMENTS:   SURGERY TYPE/DATE: 05/08/2023 bilat mastectomy with reconstruction   NUMBER OF LYMPH NODES REMOVED: SLNB: 2 axillary lymph nodes removed   CHEMOTHERAPY: 16 rounds ending early/mid Oct 2024, now has a chemo pill 2x/day for 14 days, 1 week off for 8 cycles, ending in June  RADIATION: None  HORMONE TREATMENT: None  INFECTIONS: None  LYMPHEDEMA ASSESSMENTS:   UE circumferential measurements in cm  [place]mark R Date: 08/25/23 L Date: 08/25/23 R Date: R Date:  1  MP (distal base of SF) 18.7 17.9    2   Wrist crease 15.5 15.0    3 Forearm (*) +4 cm 15.0 14.5    4 * +4 cm 17.3 16.4    5 * +4 cm 20.0 18.8    6 * +4 cm 21.9 21.5    7 * +4 cm 22.8 22.7    8  Elbow crease 22.2 22.7    9 Upper arm (**) +4 cm 22.0 21.8    10 ** +4 cm 22.2 22.5    11 ** +4 cm 23.7 23.8    12 ** +4 cm 25.4 25.5                      Total circumferential cm 246.7 cm 243.1 cm      Total Limb  differential 3.6 cm  (R 1.5% larger than L; all  points of measure are <2 cm differential throughout RUE/LUE)        Change from initial        TODAY'S TREATMENT:  DATE: 09/01/23 Manual Therapy: -Scar massage to R axilla; instructed in self massage technique.  Reviewed strategies for pt's sister and mother. -Soft tissue massage proximal>distal to target cording on volar side of RUE; Reviewed self massage technique, and review of techniques with pt's sister and mother. -Performed MLD to RUE, routing lymph to L axillary lymph node across chest, working proximal to distal throughout the RUE.  Reviewed self MLD strategies with pt, and caregiver strategies for pt's mother and sister.   Self Care: -Condition management: Reviewed indications for wearing compression -Reviewed indications for performing MLD outside of therapy (as pt does not currently have visible swelling)- MLD taught today to be proactive d/t previous build up of fluid in the R upper arm, which pt reported to resolve after a massage therapist performed MLD 1x.    -Reviewed indications for returning to therapy if RUE does show fluid build up that does not resolve with compression sleeve and self MLD. -Reinforced importance of reviewing activity restrictions with surgeon next week (Assume pt will need to take a break from AWS HEP); pt verbalized understanding.  PATIENT EDUCATION:  Education details: MLD techniques, scar massage, soft tissue  massage for cording; condition management Person educated: Patient, mother, sister Education method: Explanation and Verbal cues (will issue handouts in upcoming session) Education comprehension: verbalized understanding, further training needed  HOME EXERCISE PROGRAM: AWS exercises  ASSESSMENT:  CLINICAL IMPRESSION: Pt reports working on her AWS exercises at home since last visit and has no current questions re: her HEP.  Pt states that her surgery was moved up to next Monday (previously scheduled for July), and she will undergo removal of her spacers and have her implants put in that day.Pt in agreement with poc.  OBJECTIVE IMPAIRMENTS: decreased knowledge of condition, decreased knowledge of use of DME, decreased ROM, decreased strength, increased edema, increased fascial restrictions, impaired flexibility, impaired sensation, impaired UE functional use, and pain.   ACTIVITY LIMITATIONS: carrying, lifting, dressing, reach over head, and caring for others  PARTICIPATION LIMITATIONS: occupation  PERSONAL FACTORS: 1-2 comorbidities: bilat mastectomy, reconstruction  are also affecting patient's functional outcome.   REHAB POTENTIAL: Excellent  CLINICAL DECISION MAKING: Evolving/moderate complexity  EVALUATION COMPLEXITY: Moderate  GOALS: Goals reviewed with patient? Yes  SHORT TERM GOALS: Target date: 09/15/23   1.  Pt will be indep to verbalize 2-3 lymphedema precautions to prevent infection and lymphedema flare ups. Baseline: Eval: Initiated educ on optimal skin care; further training needed Goal status: INITIAL  2.  Pt will be indep to verbalize 1-2 signs of lymphedema flare ups and identify subsequent action steps. Baseline: Eval: Educ not yet initiated. Goal status: INITIAL  LONG TERM GOALS: Target date: 09/22/23  Pt will be indep to perform HEP for reducing R AWS and promoting RUE lymphatic drainage with use of visual handouts. Baseline: Eval: HEP not yet  initiated Goal status: INITIAL  2.  Pt will demonstrate self MLD technique with 75% accuracy using visual handouts as needed to promote lymphatic drainage throughout the RUE. Baseline: Eval: Instruction not yet initiated Goal status: INITIAL  3.  Pt will demonstrate soft tissue massage technique with 75% accuracy using visual/written handouts as needed to reduce/release R AWS in the RUE. Baseline: Eval: Not yet initiated. Goal status: INITIAL  4.  Pt will demonstrate independence with donning/doffing class 1 compression sleeve and gauntlet to the RUE. Baseline: Eval: Not yet obtained Goal status: INITIAL  5.  Pt will be demonstrate 75% or better adherence to recommended wearing schedule  for compression sleeve/glove. Baseline: Eval: Not yet obtained/wearing schedule not yet reviewed Goal status: INITIAL  PLAN: Pt reports working on her AWS exercises at home since last visit and no current questions re: her HEP.  Pt states that her surgery was moved up to next Monday (previously scheduled for July), and she will undergo removal of her spacers and have her implants put in that day.  Pt's mother and sister both present today for education on manual therapy techniques, including scar massage, soft tissue massage for AWS, and instruction in MLD technique for RUE.  Family receptive to education and remain supportive in pt's care.  OT reinforced that pt does not currently need to be performing self MLD daily, as there is currently no visible swelling in the arm, and this was completed today for OT to demo technique to allow pt to perform in the future if she feels or observes any swelling as she had in February.  Pt verbalized understanding.  OT advised pt to follow up with OT when able after her recovery from sx next week.  At that time, OT will plan to review education from today, specifically self MLD techniques with review of visual handouts (need to issue next session).  Will plan to continue with  manual therapy to release cording in the RUE.  Pt plans to follow up with [facility] this week for obtaining a RUE compression sleeve.  Will plan to provide isotoner glove from OT clinic unless pt decides to obtain guantlet from [facility], which would likely be out of pocket pay.  Pt in agreement with plan.  OT FREQUENCY: 1-2x/week  OT DURATION: 4 weeks  PLANNED INTERVENTIONS: 40981- OT Re-Evaluation, 97110-Therapeutic exercises, 97530- Therapeutic activity, 97535- Self Care, 19147- Manual therapy, 97760- Orthotic Fit/training, Patient/Family education, Joint mobilization, Manual lymph drainage, Scar mobilization, Compression bandaging, DME instructions, Cryotherapy, and Moist heat  PLAN FOR NEXT SESSION: manual therapy   Danelle Earthly, MS, OTR/L, CLT  Otis Dials, OT 09/01/2023, 11:56 AM

## 2023-09-01 NOTE — Therapy (Signed)
 OUTPATIENT PHYSICAL THERAPY TREATMENT   Patient Name: Eileen Hart MRN: [id number] DOB:09/07/1990, 33 y.o., female Today's Date: 09/01/2023    END OF SESSION:   PT End of Session - 09/01/23 1021     Visit Number 6    Number of Visits 25    Date for PT Re-Evaluation 10/30/23    PT Start Time 1020    PT Stop Time 1100    PT Time Calculation (min) 40 min    Activity Tolerance Patient tolerated treatment well    Behavior During Therapy [facility] for tasks assessed/performed              Past Medical History:  Diagnosis Date   Chlamydia 01/25/2010   Depression with anxiety    Gestational hypertension    Immunization, viral disease    gardasil series completed   Invasive carcinoma of breast (HCC)    Tobacco abuse 05/02/2016   Past Surgical History:  Procedure Laterality Date   AUGMENTATION MAMMAPLASTY Bilateral    saline implants 2015   BREAST BIOPSY Right 10/14/2022   US biopsy/ coil clip/ path pending   BREAST BIOPSY Right 10/14/2022   [place] RT BREAST BX W LOC DEV 1ST LESION IMG BX SPEC US GUIDE 10/14/2022 [facility]   BREAST BIOPSY Left 10/29/2022   [place] Core Bx Ribbon clip- path pending   BREAST BIOPSY Right 10/29/2022   [place] Core 1130 Venus Clip- path pending   BREAST BIOPSY Right 10/29/2022   [place] Core Bx Retroareolar heart clip-path pending   BREAST BIOPSY Right 10/29/2022   [place] Core Bx Ribbon Clip path pending   BREAST BIOPSY Right 10/29/2022   [place] RT BREAST BX W LOC DEV EA ADD LESION IMG BX SPEC US GUIDE 10/29/2022 [facility]   BREAST BIOPSY Right 10/29/2022   [place] RT BREAST BX W LOC DEV 1ST LESION IMG BX SPEC US GUIDE 10/29/2022 [facility]   BREAST BIOPSY Right 10/29/2022   [place] RT BREAST BX W LOC DEV EA ADD LESION IMG BX SPEC US GUIDE 10/29/2022 [facility]   BREAST BIOPSY Left 10/29/2022   [place] LT BREAST BX W LOC DEV 1ST LESION IMG BX SPEC US GUIDE 10/29/2022 [facility]   BREAST ENHANCEMENT SURGERY  2015   BREAST RECONSTRUCTION WITH PLACEMENT OF TISSUE  EXPANDER AND FLEX HD (ACELLULAR HYDRATED DERMIS) Bilateral 05/08/2023   Procedure: Bilateral immediate breast reconstruction with expanders and Flex HD placement;  Surgeon: Peggye Form, DO;  Location: [facility];  Service: Plastics;  Laterality: Bilateral;   CESAREAN SECTION  2014   MASTECTOMY W/ SENTINEL NODE BIOPSY Bilateral 05/08/2023   Procedure: MASTECTOMY Simple WITH SENTINEL LYMPH NODE BIOPSY, RNFA to assist;  Surgeon: Leafy Ro, MD;  Location: [facility];  Service: General;  Laterality: Bilateral;   PORTACATH PLACEMENT N/A 10/24/2022   Procedure: INSERTION PORT-A-CATH;  Surgeon: Leafy Ro, MD;  Location: [facility];  Service: General;  Laterality: N/A;   TONSILLECTOMY     Patient Active Problem List   Diagnosis Date Noted   S/P bilateral mastectomy 05/08/2023   Genetic testing 10/30/2022   Invasive carcinoma of breast (HCC) 10/22/2022   Malignant neoplasm of upper-outer quadrant of right breast in female, estrogen receptor negative (HCC) 10/22/2022   Lump of right breast 09/18/2022   Weight gain 12/04/2020   Right wrist pain 09/27/2019   Contraceptive management 07/07/2019   Anxiety and depression 12/29/2015    PCP: Glori Luis, MD  REFERRING PROVIDER: Scheeler, Kermit Balo, PA-C   REFERRING DIAG:  Diagnosis  C50.411,Z17.1 (ICD-10-CM) - Malignant neoplasm of upper-outer quadrant of right breast in female, estrogen receptor negative (HCC)  C50.919 (ICD-10-CM) - Invasive carcinoma of breast (HCC)  Z98.890 (ICD-10-CM) - S/P breast reconstruction   Referral note: "Recovery after bilateral mastectomy and bilateral breast reconstruction 04/2023. Has expanders in place. Patient is noticing restricted ROM. Need eval for possible lymphedema as well.   Thank you all for your help."   Rationale for Evaluation and Treatment: Rehabilitation  THERAPY DIAG:  Stiffness of right shoulder, not elsewhere classified  Stiffness of right elbow, not elsewhere  classified  Muscle weakness (generalized)  Pain in right arm  ONSET DATE: 04/2023   SUBJECTIVE:                                                                                                                                                                                           SUBJECTIVE STATEMENT: Pt's surgery has been moved up to Monday, so she'll be on lifting restrictions again for 6 weeks.     Initial Eval: The pt is a pleasant 33 y/o female, hx of breast CA, presenting to PT s/p bilateral mastectomy with bilateral breast reconstruction 05/08/2023 with expanders in place. Pt would like to address residual RUE pain, decreased ROM and strength. Pt reports pain travels from R armpit to wrist. She has difficulty fully straightening her R arm, which is impacting her ability to reach, dress, and push or pull with this arm. She is R handed. Pt also notes tightness and pressure in her R shoulder. Her strength is not what it used to be. She experiences tingling/numbness in RUE after using her phone for a while. 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 Pt reports no current activity restrictions she is aware of, but is not lifting items over 40#. She still has expanders in. Currently on chemo pill. Additionally, reports hx of R wrist tendon pain years ago, feels it every once in a while. 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 Pt has 29 y/o son.    PERTINENT HISTORY:   Pt underwent bilateral mastectomy and bilateral breast reconstruction 05/08/2023. Pt has expanders in place.  Per oncology "Patient...who underwent a bilateral diagnostic mammogram on 10/07/2022 after she felt a palpable area of concern in her right breast. Mammogram showed a 1.6 x 1 x 1.5 cm solid mass in the right breast 9:30 position 5 cm from the nipple. There was also an adjacent 6 x 9 x 2 mm mass in the right breast. No right axillary adenopathy. The dominant  mass was biopsied and was consistent with invasive mammary carcinoma grade 3 ER and HER2 negative. "  Per MD note on 07/22/2023: "She had bilateral tissue expanders placed on 05/08/2023 with Dr. Ulice Bold. Of note, expanders were placed in the subpectoral plane. We have discussed that she will not be able to have expander to implant exchange until late June early July due to current chemotherapy regimen.Marland KitchenMarland KitchenMarland KitchenOn exam bilateral breast incisions are intact, well-healed.Marland KitchenMarland KitchenMarland KitchenBilateral tissue expanders are in place, no subcutaneous fluid collection noted palpation."  Other PMH per chart consists of tobacco use, depression with anxiety  PAIN:  Are you having pain?  Avg pain: 2-4/10 worst: 4-5/10; at rest it can be 0/10 Describes pain as sharp & tight, "it's almost like there's a cord in my arm"   PRECAUTIONS:  Pt reports no current activity restrictions she is aware of, but is not lifting items over 40#.  RED FLAGS: Pt currently being treated for breast CA, no other red flags per report     WEIGHT BEARING RESTRICTIONS:  No  FALLS:  Has patient fallen in last 6 months? No  LIVING ENVIRONMENT: Lives with: lives with their son 68 y/o  Lives in: House/apartment  OCCUPATION:   [employer], has not been able to fully return to work   PLOF:  Independent  PATIENT GOALS:  Improve RUE strength, ROM, function  NEXT MD VISIT:    OBJECTIVE:  Note: Objective measures were completed at Evaluation unless otherwise noted.  DIAGNOSTIC FINDINGS:  Please refer to chart for past imaging reports PATIENT SURVEYS:  Quick Dash 34.1  COGNITIVE STATUS: Within functional limits for tasks assessed   SENSATION: WNL UE   Coordination: Rapid alt UE is WNL   EDEMA: OT lymphedema referral request being placed to address swelling of RUE    POSTURE:  No Significant postural limitations  HAND DOMINANCE:  Right  Body Part #1 Shoulder  PALPATION: TrP noted R UT, none currently, but pt reports  she tends to be TTP near R axilla   MMT (strength testing): Gross RUE is 4/5 Gross LUE strength is 4+/5 Grip strength 4+/5 bilat   AROM BUE: Shoulder Flexion: RUE: 135 deg and pain-limited LUE: 150 deg  Shoulder abduction: RUE: 119 deg and pain-limited Left: 137 deg  Shoulder ER:  RUE: 65 deg LUE: 60 deg - slight tightness but no pain   Shoulder IR: no formal measurement taken but observed WNL bilat  Elbow ROM: Flexion WNL bilat  Extension:  Lacking 10 deg RUE Able to achieve 0 deg LUE                                                                                                                            TREATMENT DATE: 09/01/2023  TE: All performed with 3# DB Seated shoulder flexion AROM for endurance  2x10 Seated shoulder abduction AROM for endurance 2x10 Seated shoulder scaption AROM for endurance 2x10   Seated shoulder rolls/circles cw/cc 10x for each  Scapular squeezes 10x with 1-2 sec hold/rep   Wall push-ups at support bar 2x10 BUE   Standing:  Body blade into forward flexion, 30 sec bouts, x2 Body blade into full flexion bilateral UE Body blade into extension behind back, 30 sec bouts  Lawn mower pulls at cable column, 7.5, 2x10       PATIENT EDUCATION:  Education details: Pt educated throughout session about proper posture and technique with exercises. Improved exercise technique, movement at target joints, use of target muscles after min to mod verbal, visual, tactile cues.  Person educated: Patient Education method: Explanation, Demonstration, and Verbal cues Education comprehension: verbalized understanding and returned demonstration  HOME EXERCISE PROGRAM: Access Code: [email or web address] URL: [email or web address] Date: 08/18/2023 Prepared by: Casimiro Needle  Exercises - Seated Elbow Extension with Self-Anchored Resistance  - 1 x daily - 5-7 x weekly - 2 sets - 15 reps - Shoulder External Rotation with Resistance  - 1 x daily -  7 x weekly - 2 sets - 15 reps - Standing Shoulder Internal Rotation with Anchored Resistance  - 1 x daily - 7 x weekly - 2 sets - 15 reps - Scapular Retraction with Resistance  - 1 x daily - 7 x weekly - 2 sets - 15 reps   ASSESSMENT:  CLINICAL IMPRESSION:   Pt responded well to the exercises and was able to tolerate an increase in resistance to 3# dumbbell.  Pt had no complication and demonstrated good ROM throughout the session.  Pt will be having procedure next Monday and will likely be cancelling visits.  Pt encouraged to keep mobility of the shoulder and strengthening prior to the surgery.   Pt will continue to benefit from skilled therapy to address remaining deficits in order to improve overall QoL and return to PLOF.       OBJECTIVE IMPAIRMENTS: decreased mobility, decreased ROM, decreased strength, hypomobility, increased edema, increased fascial restrictions, impaired flexibility, impaired sensation, impaired UE functional use, and pain.   ACTIVITY LIMITATIONS: carrying, lifting, sleeping, bathing, dressing, and reach over head  PARTICIPATION LIMITATIONS: meal prep, cleaning, shopping, community activity, occupation, and yard work  PERSONAL FACTORS: Sex and 1-2 comorbidities: depression, anxiety  are also affecting patient's functional outcome.   REHAB POTENTIAL: Good  CLINICAL DECISION MAKING: Evolving/moderate complexity  EVALUATION COMPLEXITY: Moderate   GOALS: Goals reviewed with patient? Yes  SHORT TERM GOALS: Target date: 09/18/2023    Patient will be independent in home exercise program to improve strength/mobility for better functional independence with ADLs. Baseline: initiated Goal status: INITIAL   LONG TERM GOALS: Target date: 10/30/2023   Patient will increase BUE gross strength to 5/5 as to improve functional strength for ADL ability. Baseline: Gross RUE is 4/5; Gross LUE strength is 4+/5 Goal status: INITIAL  2.  The pt will report a worst RUE  pain of no greater than 2/10 over the past 2 weeks to indicate increased QOL. Baseline:  4-5/10 Goal status: INITIAL  3.  Patient will improve RUE shoulder AROM to > 140 degrees of pain-free flexion and abduction for improved ability to perform overhead activities. Baseline: flexion 135 deg and pain-limited, abduction 119 deg and pain-limited  Goal status: INITIAL  4.   Patient will decrease Quick DASH score by > 8 points demonstrating reduced self-reported upper extremity disability. Baseline: 34.1 Goal status: INITIAL    PLAN:  PT FREQUENCY: 1-2x/week  PT DURATION: 12 weeks  PLANNED INTERVENTIONS: [id number]- PT Re-evaluation, 97110-Therapeutic exercises, 97530- Therapeutic activity, [id number]- Neuromuscular re-education, 97535- Self  Care, 09811- Manual therapy, [phone]- Gait training, [phone]- Orthotic Fit/training, [phone]- Canalith repositioning, [id number]- Splinting, [phone]- Electrical stimulation (manual), Patient/Family education, Balance training, Stair training, Taping, Dry Needling, Joint mobilization, Spinal mobilization, Scar mobilization, DME instructions, Cryotherapy, and Moist heat.  PLAN FOR NEXT SESSION:  - progressive B UE strength training within pt's tolerance  - focus on progressing to strengthening exercises with arm extended positioning     Nolon Bussing, PT, DPT Physical Therapist - [facility]  [facility]  09/01/23, 1:15 PM

## 2023-09-03 ENCOUNTER — Encounter (HOSPITAL_COMMUNITY): Payer: Self-pay

## 2023-09-03 ENCOUNTER — Ambulatory Visit: Payer: Medicaid Other | Admitting: Physical Therapy

## 2023-09-03 ENCOUNTER — Other Ambulatory Visit (HOSPITAL_COMMUNITY): Payer: Self-pay

## 2023-09-03 NOTE — Progress Notes (Signed)
 PCP - [place] - [address] [employer]  Cardiologist - none Oncology - Dr Owens Shark Plastic Surgery - Foster Simpson, DO  Chest x-ray - 10/24/22 EKG - n/a Stress Test - n/a ECHO - n/a Cardiac Cath - n/a  ICD Pacemaker/Loop - n/a  Sleep Study -  n/a  Diabetes - n/a  Aspirin and Blood Thinner Instructions:  n/a  NPO   Anesthesia review: no  STOP now taking any Aspirin (unless otherwise instructed by your surgeon), Aleve, Naproxen, Ibuprofen, Motrin, Advil, Goody's, BC's, all herbal medications, fish oil, and all vitamins.   Coronavirus Screening Do you have any of the following symptoms:  Cough yes/no: No Fever (>100.40F)  yes/no: No Runny nose yes/no: No Sore throat yes/no: No Difficulty breathing/shortness of breath  yes/no: No  Have you traveled in the last 14 days and where? yes/no: No  Patient verbalized understanding of instructions that were given to them at the PAT appointment. Patient was also instructed that they will need to review over the PAT instructions again at home before surgery.

## 2023-09-03 NOTE — Progress Notes (Signed)
 Surgical Instructions   Your procedure is scheduled on September 08, 2023. Report to [facility] Main Entrance "A" at 8:45 A.M., then check in with the Admitting office. Any questions or running late day of surgery: call [phone]  Questions prior to your surgery date: call [phone], Monday-Friday, 8am-4pm. If you experience any cold or flu symptoms such as cough, fever, chills, shortness of breath, etc. between now and your scheduled surgery, please notify us at the above number.     Remember:  Do not eat or drink  after midnight the night before your surgery       Take these medicines the morning of surgery with A SIP OF WATER  gabapentin (NEURONTIN)   May take these medicines IF NEEDED: acetaminophen (TYLENOL)  diazepam (VALIUM)  ondansetron (ZOFRAN)  oxyCODONE (OXY IR/ROXICODONE)   One week prior to surgery, STOP taking any Aspirin (unless otherwise instructed by your surgeon) Aleve, Naproxen, Ibuprofen, Motrin, Advil, Goody's, BC's, all herbal medications, fish oil, and non-prescription vitamins. THIS INCLUDES YOUR diclofenac Sodium (VOLTAREN).                      Do NOT Smoke (Tobacco/Vaping) for 24 hours prior to your procedure.  If you use a CPAP at night, you may bring your mask/headgear for your overnight stay.   You will be asked to remove any contacts, glasses, piercing's, hearing aid's, dentures/partials prior to surgery. Please bring cases for these items if needed.    Patients discharged the day of surgery will not be allowed to drive home, and someone needs to stay with them for 24 hours.  SURGICAL WAITING ROOM VISITATION Patients may have no more than 2 support people in the waiting area - these visitors may rotate.   Pre-op nurse will coordinate an appropriate time for 1 ADULT support person, who may not rotate, to accompany patient in pre-op.  Children under the age of 75 must have an adult with them who is not the patient and must remain in the main  waiting area with an adult.  If the patient needs to stay at the hospital during part of their recovery, the visitor guidelines for inpatient rooms apply.  Please refer to the [facility] website for the visitor guidelines for any additional information.   If you received a COVID test during your pre-op visit  it is requested that you wear a mask when out in public, stay away from anyone that may not be feeling well and notify your surgeon if you develop symptoms. If you have been in contact with anyone that has tested positive in the last 10 days please notify you surgeon.      Pre-operative CHG Bathing Instructions   You can play a key role in reducing the risk of infection after surgery. Your skin needs to be as free of germs as possible. You can reduce the number of germs on your skin by washing with CHG (chlorhexidine gluconate) soap before surgery. CHG is an antiseptic soap that kills germs and continues to kill germs even after washing.   DO NOT use if you have an allergy to chlorhexidine/CHG or antibacterial soaps. If your skin becomes reddened or irritated, stop using the CHG and notify one of our RNs at [phone].              TAKE A SHOWER THE NIGHT BEFORE SURGERY AND THE DAY OF SURGERY    Please keep in mind the following:  DO NOT shave,  including legs and underarms, 48 hours prior to surgery.   You may shave your face before/day of surgery.  Place clean sheets on your bed the night before surgery Use a clean washcloth (not used since being washed) for each shower. DO NOT sleep with pet's night before surgery.  CHG Shower Instructions:  Wash your face and private area with normal soap. If you choose to wash your hair, wash first with your normal shampoo.  After you use shampoo/soap, rinse your hair and body thoroughly to remove shampoo/soap residue.  Turn the water OFF and apply half the bottle of CHG soap to a CLEAN washcloth.  Apply CHG soap ONLY FROM YOUR NECK DOWN TO  YOUR TOES (washing for 3-5 minutes)  DO NOT use CHG soap on face, private areas, open wounds, or sores.  Pay special attention to the area where your surgery is being performed.  If you are having back surgery, having someone wash your back for you may be helpful. Wait 2 minutes after CHG soap is applied, then you may rinse off the CHG soap.  Pat dry with a clean towel  Put on clean pajamas    Additional instructions for the day of surgery: DO NOT APPLY any lotions, deodorants, cologne, or perfumes.   Do not wear jewelry or makeup Do not wear nail polish, gel polish, artificial nails, or any other type of covering on natural nails (fingers and toes) Do not bring valuables to the hospital. [facility] is not responsible for valuables/personal belongings. Put on clean/comfortable clothes.  Please brush your teeth.  Ask your nurse before applying any prescription medications to the skin.

## 2023-09-04 ENCOUNTER — Ambulatory Visit (INDEPENDENT_AMBULATORY_CARE_PROVIDER_SITE_OTHER): Payer: Medicaid Other | Admitting: Surgical

## 2023-09-04 ENCOUNTER — Other Ambulatory Visit: Payer: Self-pay

## 2023-09-04 ENCOUNTER — Encounter: Payer: Self-pay | Admitting: Surgical

## 2023-09-04 ENCOUNTER — Encounter (HOSPITAL_COMMUNITY)
Admission: RE | Admit: 2023-09-04 | Discharge: 2023-09-04 | Disposition: A | Discharge status: Home / Self Care | Source: Ambulatory Visit | Attending: Plastic Surgery | Admitting: Plastic Surgery

## 2023-09-04 ENCOUNTER — Encounter (HOSPITAL_COMMUNITY): Payer: Self-pay

## 2023-09-04 VITALS — BP 120/76 | HR 79 | Ht 64.0 in | Wt 112.6 lb

## 2023-09-04 DIAGNOSIS — Z01818 Encounter for other preprocedural examination: Secondary | ICD-10-CM | POA: Diagnosis present

## 2023-09-04 DIAGNOSIS — C50411 Malignant neoplasm of upper-outer quadrant of right female breast: Secondary | ICD-10-CM

## 2023-09-04 DIAGNOSIS — C50919 Malignant neoplasm of unspecified site of unspecified female breast: Secondary | ICD-10-CM

## 2023-09-04 DIAGNOSIS — Z171 Estrogen receptor negative status [ER-]: Secondary | ICD-10-CM

## 2023-09-04 NOTE — H&P (View-Only) (Signed)
 Patient is a very pleasant 33 year old female here for follow-up on her bilateral breast reconstruction.  She had tissue expanders placed after bilateral nipple sparing mastectomy on 05/08/2023.  She was initially scheduled for exchange of her expanders around July, however developed a leak in the left breast tissue expander and therefore plan was made to exchange expanders for implants much sooner.  She is scheduled for this next week.  She is here today for a fill on the left side to maintain symmetry and allow pocket to continue to remain adequate volume for explantation of expanders and implantation of permanent implants.  Today she reports she is overall doing really well.  She is happy with the size of her right breast expander  She currently has 430 cc/455 cc in her right breast expander.  Left breast expander volume is unknown due to leak.   We placed injectable saline in the Expander using a sterile technique: Right: 0 cc for a total of 430 / 455 cc Left: 70 cc for a total of ??? / 455 cc  We will see patient on Monday for surgery.  She did not have any additional questions.

## 2023-09-04 NOTE — Progress Notes (Signed)
 Patient is a very pleasant 33 year old female here for follow-up on her bilateral breast reconstruction.  She had tissue expanders placed after bilateral nipple sparing mastectomy on 05/08/2023.  She was initially scheduled for exchange of her expanders around July, however developed a leak in the left breast tissue expander and therefore plan was made to exchange expanders for implants much sooner.  She is scheduled for this next week.  She is here today for a fill on the left side to maintain symmetry and allow pocket to continue to remain adequate volume for explantation of expanders and implantation of permanent implants.  Today she reports she is overall doing really well.  She is happy with the size of her right breast expander  She currently has 430 cc/455 cc in her right breast expander.  Left breast expander volume is unknown due to leak.   We placed injectable saline in the Expander using a sterile technique: Right: 0 cc for a total of 430 / 455 cc Left: 70 cc for a total of ??? / 455 cc  We will see patient on Monday for surgery.  She did not have any additional questions.

## 2023-09-05 ENCOUNTER — Other Ambulatory Visit (HOSPITAL_COMMUNITY): Payer: Self-pay

## 2023-09-08 ENCOUNTER — Ambulatory Visit (HOSPITAL_COMMUNITY): Payer: Self-pay | Admitting: Anesthesiology

## 2023-09-08 ENCOUNTER — Encounter (HOSPITAL_COMMUNITY): Payer: Self-pay | Admitting: Plastic Surgery

## 2023-09-08 ENCOUNTER — Ambulatory Visit (HOSPITAL_COMMUNITY)
Admission: RE | Admit: 2023-09-08 | Discharge: 2023-09-08 | Disposition: A | Discharge status: Home / Self Care | Attending: Plastic Surgery | Admitting: Plastic Surgery

## 2023-09-08 ENCOUNTER — Other Ambulatory Visit (HOSPITAL_COMMUNITY): Payer: Self-pay

## 2023-09-08 ENCOUNTER — Other Ambulatory Visit: Payer: Self-pay

## 2023-09-08 ENCOUNTER — Ambulatory Visit (HOSPITAL_BASED_OUTPATIENT_CLINIC_OR_DEPARTMENT_OTHER): Payer: Self-pay | Admitting: Anesthesiology

## 2023-09-08 ENCOUNTER — Inpatient Hospital Stay: Payer: Medicaid Other | Attending: Oncology

## 2023-09-08 ENCOUNTER — Encounter (HOSPITAL_COMMUNITY)
Admission: RE | Disposition: A | Discharge status: Home / Self Care | Payer: Self-pay | Source: Home / Self Care | Attending: Plastic Surgery

## 2023-09-08 ENCOUNTER — Ambulatory Visit: Payer: Medicaid Other

## 2023-09-08 DIAGNOSIS — Z791 Long term (current) use of non-steroidal anti-inflammatories (NSAID): Secondary | ICD-10-CM | POA: Diagnosis not present

## 2023-09-08 DIAGNOSIS — Z853 Personal history of malignant neoplasm of breast: Secondary | ICD-10-CM

## 2023-09-08 DIAGNOSIS — Z45811 Encounter for adjustment or removal of right breast implant: Secondary | ICD-10-CM | POA: Diagnosis not present

## 2023-09-08 DIAGNOSIS — F32A Depression, unspecified: Secondary | ICD-10-CM | POA: Insufficient documentation

## 2023-09-08 DIAGNOSIS — Z803 Family history of malignant neoplasm of breast: Secondary | ICD-10-CM | POA: Insufficient documentation

## 2023-09-08 DIAGNOSIS — Z45812 Encounter for adjustment or removal of left breast implant: Secondary | ICD-10-CM | POA: Insufficient documentation

## 2023-09-08 DIAGNOSIS — Z421 Encounter for breast reconstruction following mastectomy: Secondary | ICD-10-CM | POA: Diagnosis not present

## 2023-09-08 DIAGNOSIS — I10 Essential (primary) hypertension: Secondary | ICD-10-CM | POA: Diagnosis not present

## 2023-09-08 DIAGNOSIS — Z79899 Other long term (current) drug therapy: Secondary | ICD-10-CM | POA: Diagnosis not present

## 2023-09-08 DIAGNOSIS — F17290 Nicotine dependence, other tobacco product, uncomplicated: Secondary | ICD-10-CM | POA: Insufficient documentation

## 2023-09-08 DIAGNOSIS — F419 Anxiety disorder, unspecified: Secondary | ICD-10-CM | POA: Diagnosis not present

## 2023-09-08 HISTORY — PX: REMOVAL OF BILATERAL TISSUE EXPANDERS WITH PLACEMENT OF BILATERAL BREAST IMPLANTS: SHX6431

## 2023-09-08 LAB — POCT PREGNANCY, URINE: Preg Test, Ur: NEGATIVE

## 2023-09-08 SURGERY — REMOVAL, TISSUE EXPANDER, BREAST, BILATERAL, WITH BILATERAL IMPLANT IMPLANT INSERTION
Anesthesia: General | Site: Breast | Laterality: Bilateral

## 2023-09-08 MED ORDER — LACTATED RINGERS IV SOLN: INTRAVENOUS | Status: DC

## 2023-09-08 MED ORDER — FENTANYL CITRATE (PF) 250 MCG/5ML IJ SOLN
INTRAMUSCULAR | Status: AC
  Filled 2023-09-08: qty 5

## 2023-09-08 MED ORDER — ONDANSETRON HCL 4 MG/2ML IJ SOLN
INTRAMUSCULAR | Status: DC | PRN
  Administered 2023-09-08: 4 mg via INTRAVENOUS

## 2023-09-08 MED ORDER — GABAPENTIN 300 MG PO CAPS
300.0000 mg | ORAL_CAPSULE | Freq: Once | ORAL | Status: AC
  Administered 2023-09-08: 300 mg via ORAL
  Filled 2023-09-08: qty 1

## 2023-09-08 MED ORDER — CEFAZOLIN SODIUM-DEXTROSE 2-4 GM/100ML-% IV SOLN
2.0000 g | INTRAVENOUS | Status: DC
  Filled 2023-09-08: qty 100

## 2023-09-08 MED ORDER — SUGAMMADEX SODIUM 200 MG/2ML IV SOLN
INTRAVENOUS | Status: DC | PRN
  Administered 2023-09-08: 100 mg via INTRAVENOUS

## 2023-09-08 MED ORDER — VASHE WOUND IRRIGATION OPTIME
TOPICAL | Status: DC | PRN
  Administered 2023-09-08: 34 [oz_av]

## 2023-09-08 MED ORDER — 0.9 % SODIUM CHLORIDE (POUR BTL) OPTIME
TOPICAL | Status: DC | PRN
  Administered 2023-09-08: 1000 mL

## 2023-09-08 MED ORDER — CHLORHEXIDINE GLUCONATE 0.12 % MT SOLN: 15.0000 mL | Freq: Once | OROMUCOSAL | Status: AC

## 2023-09-08 MED ORDER — PROPOFOL 10 MG/ML IV BOLUS
INTRAVENOUS | Status: DC | PRN
  Administered 2023-09-08: 30 mg via INTRAVENOUS
  Administered 2023-09-08: 120 mg via INTRAVENOUS

## 2023-09-08 MED ORDER — FENTANYL CITRATE (PF) 250 MCG/5ML IJ SOLN
INTRAMUSCULAR | Status: DC | PRN
  Administered 2023-09-08: 100 ug via INTRAVENOUS
  Administered 2023-09-08: 50 ug via INTRAVENOUS

## 2023-09-08 MED ORDER — LIDOCAINE-EPINEPHRINE 1 %-1:100000 IJ SOLN
INTRAMUSCULAR | Status: DC | PRN
  Administered 2023-09-08: 10 mL via SURGICAL_CAVITY

## 2023-09-08 MED ORDER — PHENYLEPHRINE 80 MCG/ML (10ML) SYRINGE FOR IV PUSH (FOR BLOOD PRESSURE SUPPORT)
PREFILLED_SYRINGE | INTRAVENOUS | Status: DC | PRN
  Administered 2023-09-08: 80 ug via INTRAVENOUS
  Administered 2023-09-08: 40 ug via INTRAVENOUS

## 2023-09-08 MED ORDER — ORAL CARE MOUTH RINSE: 15.0000 mL | Freq: Once | OROMUCOSAL | Status: AC

## 2023-09-08 MED ORDER — BUPIVACAINE HCL (PF) 0.25 % IJ SOLN
INTRAMUSCULAR | Status: AC
  Filled 2023-09-08: qty 30

## 2023-09-08 MED ORDER — LIDOCAINE-EPINEPHRINE 1 %-1:100000 IJ SOLN
INTRAMUSCULAR | Status: AC
  Filled 2023-09-08: qty 1

## 2023-09-08 MED ORDER — DEXAMETHASONE SODIUM PHOSPHATE 10 MG/ML IJ SOLN
INTRAMUSCULAR | Status: DC | PRN
  Administered 2023-09-08: 10 mg via INTRAVENOUS

## 2023-09-08 MED ORDER — MIDAZOLAM HCL 2 MG/2ML IJ SOLN
INTRAMUSCULAR | Status: AC
  Filled 2023-09-08: qty 2

## 2023-09-08 MED ORDER — PROPOFOL 500 MG/50ML IV EMUL
INTRAVENOUS | Status: DC | PRN
  Administered 2023-09-08: 125 ug/kg/min via INTRAVENOUS

## 2023-09-08 MED ORDER — CHLORHEXIDINE GLUCONATE CLOTH 2 % EX PADS: 6.0000 | MEDICATED_PAD | Freq: Once | CUTANEOUS | Status: DC

## 2023-09-08 MED ORDER — SCOPOLAMINE 1 MG/3DAYS TD PT72
1.0000 | MEDICATED_PATCH | Freq: Once | TRANSDERMAL | Status: DC
  Filled 2023-09-08: qty 1

## 2023-09-08 MED ORDER — LACTATED RINGERS IV SOLN
Freq: Once | INTRAVENOUS | Status: DC
  Filled 2023-09-08 (×3): qty 30

## 2023-09-08 MED ORDER — LIDOCAINE 2% (20 MG/ML) 5 ML SYRINGE
INTRAMUSCULAR | Status: DC | PRN
  Administered 2023-09-08: 60 mg via INTRAVENOUS

## 2023-09-08 MED ORDER — ACETAMINOPHEN 500 MG PO TABS
1000.0000 mg | ORAL_TABLET | Freq: Once | ORAL | Status: AC
  Administered 2023-09-08: 1000 mg via ORAL
  Filled 2023-09-08: qty 2

## 2023-09-08 MED ORDER — MIDAZOLAM HCL 2 MG/2ML IJ SOLN
INTRAMUSCULAR | Status: DC | PRN
  Administered 2023-09-08: 2 mg via INTRAVENOUS

## 2023-09-08 MED ORDER — CHLORHEXIDINE GLUCONATE 0.12 % MT SOLN
OROMUCOSAL | Status: AC
  Administered 2023-09-08: 15 mL via OROMUCOSAL
  Filled 2023-09-08: qty 15

## 2023-09-08 MED ORDER — ROCURONIUM BROMIDE 10 MG/ML (PF) SYRINGE
PREFILLED_SYRINGE | INTRAVENOUS | Status: DC | PRN
  Administered 2023-09-08: 60 mg via INTRAVENOUS

## 2023-09-08 SURGICAL SUPPLY — 57 items
BAG COUNTER SPONGE SURGICOUNT (BAG) ×1 IMPLANT
BAG DECANTER FOR FLEXI CONT (MISCELLANEOUS) ×1 IMPLANT
BINDER BREAST LRG (GAUZE/BANDAGES/DRESSINGS) IMPLANT
BINDER BREAST XLRG (GAUZE/BANDAGES/DRESSINGS) IMPLANT
BIOPATCH RED 1 DISK 7.0 (GAUZE/BANDAGES/DRESSINGS) ×1 IMPLANT
BLADE SURG 15 STRL LF DISP TIS (BLADE) IMPLANT
CANISTER SUCT 3000ML PPV (MISCELLANEOUS) ×1 IMPLANT
CHLORAPREP W/TINT 26 (MISCELLANEOUS) ×1 IMPLANT
COVER SURGICAL LIGHT HANDLE (MISCELLANEOUS) ×1 IMPLANT
DERMABOND ADVANCED .7 DNX12 (GAUZE/BANDAGES/DRESSINGS) ×1 IMPLANT
DRAIN CHANNEL 19F RND (DRAIN) ×1 IMPLANT
DRAPE HALF SHEET 40X57 (DRAPES) ×2 IMPLANT
DRAPE SURG 17X23 STRL (DRAPES) ×4 IMPLANT
DRAPE SURG ORHT 6 SPLT 77X108 (DRAPES) ×2 IMPLANT
DRAPE WARM FLUID 44X44 (DRAPES) ×1 IMPLANT
DRSG MEPILEX POST OP 4X8 (GAUZE/BANDAGES/DRESSINGS) IMPLANT
ELECT BLADE 4.0 EZ CLEAN MEGAD (MISCELLANEOUS) ×1 IMPLANT
ELECT REM PT RETURN 9FT ADLT (ELECTROSURGICAL) ×1 IMPLANT
ELECTRODE BLDE 4.0 EZ CLN MEGD (MISCELLANEOUS) ×1 IMPLANT
ELECTRODE REM PT RTRN 9FT ADLT (ELECTROSURGICAL) ×1 IMPLANT
EVACUATOR SILICONE 100CC (DRAIN) ×1 IMPLANT
FUNNEL KELLER 2 DISP (MISCELLANEOUS) ×1 IMPLANT
GAUZE PAD ABD 8X10 STRL (GAUZE/BANDAGES/DRESSINGS) ×2 IMPLANT
GAUZE SPONGE 4X4 12PLY STRL (GAUZE/BANDAGES/DRESSINGS) ×1 IMPLANT
GLOVE BIO SURGEON STRL SZ 6.5 (GLOVE) ×1 IMPLANT
GOWN STRL REUS W/ TWL LRG LVL3 (GOWN DISPOSABLE) ×2 IMPLANT
IMPL BREAST GEL HP 400CC (Breast) IMPLANT
IMPLANT BREAST GEL HP 400CC (Breast) ×2 IMPLANT
KIT BASIN OR (CUSTOM PROCEDURE TRAY) ×1 IMPLANT
KIT TURNOVER KIT B (KITS) ×1 IMPLANT
NDL 18GX1X1/2 (RX/OR ONLY) (NEEDLE) IMPLANT
NDL HYPO 25GX1X1/2 BEV (NEEDLE) IMPLANT
NEEDLE 18GX1X1/2 (RX/OR ONLY) (NEEDLE) ×1 IMPLANT
NEEDLE HYPO 25GX1X1/2 BEV (NEEDLE) ×1 IMPLANT
NS IRRIG 1000ML POUR BTL (IV SOLUTION) ×2 IMPLANT
PACK GENERAL/GYN (CUSTOM PROCEDURE TRAY) ×1 IMPLANT
PAD ARMBOARD POSITIONER FOAM (MISCELLANEOUS) ×1 IMPLANT
PIN SAFETY STERILE (MISCELLANEOUS) ×1 IMPLANT
SET ASEPTIC TRANSFER (MISCELLANEOUS) IMPLANT
SIZER BREAST GEL REUSE 430CC (SIZER) ×1 IMPLANT
SIZER BRST GEL REUSE 430CC (SIZER) IMPLANT
SIZER BRST REUSE 400CC (SIZER) IMPLANT
STAPLER VISISTAT 35W (STAPLE) IMPLANT
STRIP CLOSURE SKIN 1/2X4 (GAUZE/BANDAGES/DRESSINGS) IMPLANT
SUT MNCRL AB 3-0 PS2 27 (SUTURE) IMPLANT
SUT MNCRL AB 4-0 PS2 18 (SUTURE) IMPLANT
SUT MON AB 5-0 PS2 18 (SUTURE) ×2 IMPLANT
SUT PDS AB 2-0 CT1 27 (SUTURE) IMPLANT
SUT PDS AB 3-0 SH 27 (SUTURE) IMPLANT
SUT SILK 3 0 SH 30 (SUTURE) ×1 IMPLANT
SUT VIC AB 3-0 SH 27X BRD (SUTURE) ×3 IMPLANT
SUT VICRYL 4-0 PS2 18IN ABS (SUTURE) ×2 IMPLANT
SYR CONTROL 10ML LL (SYRINGE) IMPLANT
TOWEL GREEN STERILE (TOWEL DISPOSABLE) ×1 IMPLANT
TOWEL GREEN STERILE FF (TOWEL DISPOSABLE) ×1 IMPLANT
TRAY FOLEY MTR SLVR 14FR STAT (SET/KITS/TRAYS/PACK) IMPLANT
YANKAUER SUCT BULB TIP NO VENT (SUCTIONS) IMPLANT

## 2023-09-08 NOTE — Op Note (Signed)
 Op report Bilateral Exchange   DATE OF OPERATION: 09/08/2023  LOCATION: [facility]patient  SURGICAL DIVISION: Plastic Surgery  PREOPERATIVE DIAGNOSIS:  1.History of right breast cancer.  2. Acquired absence of bilateral breast.   POSTOPERATIVE DIAGNOSIS:  1. History of right breast cancer.  2. Acquired absence of bilateral breast.   PROCEDURE:  1. Bilateral exchange of tissue expanders for implants.  2. Bilateral capsulotomies for implant respositioning.  SURGEON: Foster Simpson, DO  ASSISTANT: Keenan Bachelor, PA  ANESTHESIA:  General.   COMPLICATIONS: None.   IMPLANTS: Left - Mentor Smooth Round Ultra High Profile Gel 400 cc. Right - Mentor Smooth Round Ultra High Profile Gel 400 cc  INDICATIONS FOR PROCEDURE:  The patient, Eileen Hart, is a 33 y.o. female born on [birth year], is here for treatment after bilateral mastectomies.  She had tissue expanders placed at the time of mastectomies. She now presents for exchange of her expanders for implants.  She requires capsulotomies to better position the implants. MRN: [id number]  CONSENT:  Informed consent was obtained directly from the patient. Risks, benefits and alternatives were fully discussed. Specific risks including but not limited to bleeding, infection, hematoma, seroma, scarring, pain, implant infection, implant extrusion, capsular contracture, asymmetry, wound healing problems, and need for further surgery were all discussed. The patient did have an ample opportunity to have her questions answered to her satisfaction.   DESCRIPTION OF PROCEDURE:  The patient was taken to the operating room. SCDs were placed and IV antibiotics were given. The patient's chest was prepped and draped in a sterile fashion. A time out was performed and the implants to be used were identified.    On the right breast: Local with epinephrine was used to infiltrate at the incision site. The old mastectomy scar was  incised.  The mastectomy flaps from the superior and inferior flaps were raised over the ADM for several centimeters to minimize tension for the closure. The ADM was split inferior to the skin incision to expose and remove the tissue expander.  Inspection of the pocket showed a normal healthy capsule and good integration of the biologic matrix.  The pocket was irrigated with antibiotic solution. Superior and slightly medial capsulotomies were performed to allow for breast pocket expansion.  Measurements were made and a sizer used to confirm adequate pocket size for the implant dimensions. The 430 cc implant was too large especially for the left side since it had tightened up. Hemostasis was ensured with electrocautery. New gloves were placed. The implant was soaked in Vashe solution and then placed in the pocket and oriented appropriately. The ADM and capsule on the anterior surface were re-closed with a 3-0 PDS suture. The remaining skin was closed with 3-0 PDS deep dermal and 4-0 Monocryl subcuticular stitches.   On the left breast: The old mastectomy scar was incised.  The mastectomy flaps from the superior and inferior flaps were raised over the ADM for several centimeters to minimize tension for the closure. The ADM was split inferior to the skin incision to expose and remove the tissue expander.  Inspection of the pocket showed a normal healthy capsule and good integration of the biologic matrix. Superior and slightly medial capsulotomies were performed to allow for breast pocket expansion.  Measurements were made and a sizer utilized to confirm adequate pocket size for the implant dimensions.  Hemostasis was ensured with the electrocautery.  New gloves were applied. The implant was soaked in Vashe solution and placed in the pocket  and oriented appropriately. The ADM and capsule on the anterior surface were re-closed with a 3-0 ADM suture. The remaining skin was closed with 3-0 Monocryl deep dermal and 4-0  Monocryl subcuticular stitches.  Dermabond was applied to the incision site. A breast binder and ABDs were placed.  The patient was allowed to wake from anesthesia and taken to the recovery room in satisfactory condition.   The advanced practice practitioner (APP) assisted throughout the case.  The APP was essential in retraction and counter traction when needed to make the case progress smoothly.  This retraction and assistance made it possible to see the tissue plans for the procedure.  The assistance was needed for blood control, tissue re-approximation and assisted with closure of the incision site.

## 2023-09-08 NOTE — Anesthesia Procedure Notes (Signed)
 Procedure Name: Intubation Date/Time: 09/08/2023 10:26 AM  Performed by: Hali Marry, CRNAPre-anesthesia Checklist: Patient identified, Emergency Drugs available, Suction available and Patient being monitored Patient Re-evaluated:Patient Re-evaluated prior to induction Oxygen Delivery Method: Circle system utilized Preoxygenation: Pre-oxygenation with 100% oxygen Induction Type: IV induction Ventilation: Mask ventilation without difficulty Laryngoscope Size: Mac and 3 Grade View: Grade I Tube type: Oral Tube size: 6.5 mm Number of attempts: 1 Airway Equipment and Method: Stylet and Oral airway Placement Confirmation: ETT inserted through vocal cords under direct vision, positive ETCO2 and breath sounds checked- equal and bilateral Secured at: 21 cm Tube secured with: Tape Dental Injury: Teeth and Oropharynx as per pre-operative assessment

## 2023-09-08 NOTE — Interval H&P Note (Signed)
 History and Physical Interval Note:  09/08/2023 9:23 AM  Eileen Hart  has presented today for surgery, with the diagnosis of Malignant neoplasm of upper-outer quadrant of right breast in female, estrogen receptor negative.  The various methods of treatment have been discussed with the patient and family. After consideration of risks, benefits and other options for treatment, the patient has consented to  Procedure(s): REMOVAL, TISSUE EXPANDER, BREAST, BILATERAL, WITH BILATERAL IMPLANT IMPLANT INSERTION (Bilateral) as a surgical intervention.  The patient's history has been reviewed, patient examined, no change in status, stable for surgery.  I have reviewed the patient's chart and labs.  Questions were answered to the patient's satisfaction.     Alena Bills Jerrick Farve

## 2023-09-08 NOTE — Anesthesia Preprocedure Evaluation (Addendum)
 Anesthesia Evaluation  Patient identified by MRN, date of birth, ID band Patient awake    Reviewed: Allergy & Precautions, NPO status , Patient's Chart, lab work & pertinent test results  Airway Mallampati: II  TM Distance: >3 FB Neck ROM: Full    Dental  (+) Dental Advisory Given, Caps,    Pulmonary former smoker   Pulmonary exam normal breath sounds clear to auscultation       Cardiovascular hypertension, Normal cardiovascular exam Rhythm:Regular Rate:Normal     Neuro/Psych  PSYCHIATRIC DISORDERS Anxiety Depression    negative neurological ROS     GI/Hepatic negative GI ROS, Neg liver ROS,,,  Endo/Other  negative endocrine ROS    Renal/GU negative Renal ROS     Musculoskeletal negative musculoskeletal ROS (+)    Abdominal   Peds  Hematology negative hematology ROS (+)   Anesthesia Other Findings Day of surgery medications reviewed with the patient.  Malignant neoplasm of upper-outer quadrant of right breast in female, estrogen receptor negative  Reproductive/Obstetrics                             Anesthesia Physical Anesthesia Plan  ASA: 2  Anesthesia Plan: General   Post-op Pain Management: Tylenol PO (pre-op)* and Toradol IV (intra-op)*   Induction: Intravenous  PONV Risk Score and Plan: 3 and Midazolam, Dexamethasone and Ondansetron  Airway Management Planned: Oral ETT  Additional Equipment:   Intra-op Plan:   Post-operative Plan: Extubation in OR  Informed Consent: I have reviewed the patients History and Physical, chart, labs and discussed the procedure including the risks, benefits and alternatives for the proposed anesthesia with the patient or authorized representative who has indicated his/her understanding and acceptance.     Dental advisory given  Plan Discussed with: CRNA  Anesthesia Plan Comments:         Anesthesia Quick Evaluation

## 2023-09-08 NOTE — Discharge Instructions (Signed)

## 2023-09-08 NOTE — Anesthesia Postprocedure Evaluation (Signed)
 Anesthesia Post Note  Patient: KAILYNN SATTERLY  Procedure(s) Performed: REMOVAL, TISSUE EXPANDER, BREAST, BILATERAL, WITH BILATERAL IMPLANT IMPLANT INSERTION (Bilateral: Breast)     Patient location during evaluation: PACU Anesthesia Type: General Level of consciousness: awake and alert Pain management: pain level controlled Vital Signs Assessment: post-procedure vital signs reviewed and stable Respiratory status: spontaneous breathing, nonlabored ventilation and respiratory function stable Cardiovascular status: blood pressure returned to baseline and stable Postop Assessment: no apparent nausea or vomiting Anesthetic complications: no   No notable events documented.  Last Vitals:  Vitals:   09/08/23 1200 09/08/23 1215  BP: 100/83 106/84  Pulse: 80 69  Resp: 20 18  Temp:  (!) 36.4 C  SpO2: 96% 100%    Last Pain:  Vitals:   09/08/23 1215  TempSrc:   PainSc: 0-No pain                 Collene Schlichter

## 2023-09-08 NOTE — Transfer of Care (Signed)
 Immediate Anesthesia Transfer of Care Note  Patient: Eileen Hart  Procedure(s) Performed: REMOVAL, TISSUE EXPANDER, BREAST, BILATERAL, WITH BILATERAL IMPLANT IMPLANT INSERTION (Bilateral: Breast)  Patient Location: PACU  Anesthesia Type:General  Level of Consciousness: awake, alert , and oriented  Airway & Oxygen Therapy: Patient Spontanous Breathing and Patient connected to nasal cannula oxygen  Post-op Assessment: Report given to RN and Post -op Vital signs reviewed and stable  Post vital signs: Reviewed and stable  Last Vitals:  Vitals Value Taken Time  BP 112/68 09/08/23 1148  Temp    Pulse    Resp 13 09/08/23 1152  SpO2    Vitals shown include unfiled device data.  Last Pain:  Vitals:   09/08/23 0950  TempSrc:   PainSc: 0-No pain         Complications: No notable events documented.

## 2023-09-09 ENCOUNTER — Other Ambulatory Visit: Payer: Self-pay

## 2023-09-09 ENCOUNTER — Telehealth: Payer: Self-pay | Admitting: *Deleted

## 2023-09-09 ENCOUNTER — Encounter (HOSPITAL_COMMUNITY): Payer: Self-pay | Admitting: Plastic Surgery

## 2023-09-09 NOTE — Telephone Encounter (Signed)
 Received on (09/04/2023) via of fax an Confirmation of Order for Mastectomy Sleeve from [facility].  Requesting signature,date,and return.  Given to provider to complete.     Order signed,dated,and faxed back to [facility].  Confirmation received and copy scanned into the chart.//AB/CMA

## 2023-09-10 ENCOUNTER — Ambulatory Visit

## 2023-09-10 ENCOUNTER — Ambulatory Visit: Payer: Medicaid Other | Admitting: Physical Therapy

## 2023-09-10 DIAGNOSIS — M25621 Stiffness of right elbow, not elsewhere classified: Secondary | ICD-10-CM | POA: Diagnosis not present

## 2023-09-10 DIAGNOSIS — L7682 Other postprocedural complications of skin and subcutaneous tissue: Secondary | ICD-10-CM

## 2023-09-10 DIAGNOSIS — I890 Lymphedema, not elsewhere classified: Secondary | ICD-10-CM

## 2023-09-10 DIAGNOSIS — Z9013 Acquired absence of bilateral breasts and nipples: Secondary | ICD-10-CM

## 2023-09-11 ENCOUNTER — Other Ambulatory Visit: Payer: Self-pay

## 2023-09-12 ENCOUNTER — Ambulatory Visit: Payer: Medicaid Other | Admitting: Physical Therapy

## 2023-09-14 NOTE — Therapy (Signed)
 OUTPATIENT OCCUPATIONAL THERAPY LYMPHEDEMA TREATMENT NOTE  Patient Name: Eileen Hart MRN: [id number] DOB:December 12, 1990, 33 y.o., female Today's Date: 09/14/2023  END OF SESSION:   OT End of Session - 09/14/23 0920     Visit Number 4    Number of Visits 6    Date for OT Re-Evaluation 09/22/23    Authorization Type [place] MEDICAID PREPAID HEALTH PLAN    OT Start Time 0940    OT Stop Time 1015    OT Time Calculation (min) 35 min    Equipment Utilized During Treatment None    Activity Tolerance Patient tolerated treatment well    Behavior During Therapy WFL for tasks assessed/performed            Past Medical History:  Diagnosis Date   Chlamydia 01/25/2010   Depression with anxiety    Gestational hypertension    resolved after delivery   Immunization, viral disease    gardasil series completed   Invasive carcinoma of breast (HCC)    right   Tobacco abuse 05/02/2016   Past Surgical History:  Procedure Laterality Date   AUGMENTATION MAMMAPLASTY Bilateral    saline implants 2015   BREAST BIOPSY Right 10/14/2022   US biopsy/ coil clip/ path pending   BREAST BIOPSY Right 10/14/2022   [place] RT BREAST BX W LOC DEV 1ST LESION IMG BX SPEC US GUIDE 10/14/2022 [facility]   BREAST BIOPSY Left 10/29/2022   [place] Core Bx Ribbon clip- path pending   BREAST BIOPSY Right 10/29/2022   [place] Core 1130 Venus Clip- path pending   BREAST BIOPSY Right 10/29/2022   [place] Core Bx Retroareolar heart clip-path pending   BREAST BIOPSY Right 10/29/2022   [place] Core Bx Ribbon Clip path pending   BREAST BIOPSY Right 10/29/2022   [place] RT BREAST BX W LOC DEV EA ADD LESION IMG BX SPEC US GUIDE 10/29/2022 [facility]   BREAST BIOPSY Right 10/29/2022   [place] RT BREAST BX W LOC DEV 1ST LESION IMG BX SPEC US GUIDE 10/29/2022 [facility]   BREAST BIOPSY Right 10/29/2022   [place] RT BREAST BX W LOC DEV EA ADD LESION IMG BX SPEC US GUIDE 10/29/2022 [facility]   BREAST BIOPSY Left 10/29/2022   [place] LT BREAST BX W  LOC DEV 1ST LESION IMG BX SPEC US GUIDE 10/29/2022 [facility]   BREAST ENHANCEMENT SURGERY  2015   BREAST RECONSTRUCTION WITH PLACEMENT OF TISSUE EXPANDER AND FLEX HD (ACELLULAR HYDRATED DERMIS) Bilateral 05/08/2023   Procedure: Bilateral immediate breast reconstruction with expanders and Flex HD placement;  Surgeon: Peggye Form, DO;  Location: [facility];  Service: Plastics;  Laterality: Bilateral;   CESAREAN SECTION  2014   MASTECTOMY W/ SENTINEL NODE BIOPSY Bilateral 05/08/2023   Procedure: MASTECTOMY Simple WITH SENTINEL LYMPH NODE BIOPSY, RNFA to assist;  Surgeon: Leafy Ro, MD;  Location: [facility];  Service: General;  Laterality: Bilateral;   PORT-A-CATH REMOVAL  04/2023   PORTACATH PLACEMENT N/A 10/24/2022   PortA Cath removed; Procedure: INSERTION PORT-A-CATH;  Surgeon: Leafy Ro, MD;  Location: [facility];  Service: General;  Laterality: N/A;   REMOVAL OF BILATERAL TISSUE EXPANDERS WITH PLACEMENT OF BILATERAL BREAST IMPLANTS Bilateral 09/08/2023   Procedure: REMOVAL, TISSUE EXPANDER, BREAST, BILATERAL, WITH BILATERAL IMPLANT IMPLANT INSERTION;  Surgeon: Peggye Form, DO;  Location: [facility];  Service: Plastics;  Laterality: Bilateral;   TONSILLECTOMY     Patient Active Problem List   Diagnosis Date Noted   S/P bilateral mastectomy 05/08/2023   Genetic testing  10/30/2022   Invasive carcinoma of breast (HCC) 10/22/2022   Malignant neoplasm of upper-outer quadrant of right breast in female, estrogen receptor negative (HCC) 10/22/2022   Lump of right breast 09/18/2022   Weight gain 12/04/2020   Right wrist pain 09/27/2019   Contraceptive management 07/07/2019   Anxiety and depression 12/29/2015   PCP: Dr. Birdie Sons (Pt reports this MD just left the practice, but she will continue with this practice with another MD)  REFERRING PROVIDER: Keenan Bachelor, J, PA  REFERRING DIAG:  [phone] (ICD-10-CM) - Malignant neoplasm of upper-outer quadrant of right female  breast  Z17.1 (ICD-10-CM) - Estrogen receptor negative status (ER-)  C50.919 (ICD-10-CM) - Malignant neoplasm of unspecified site of unspecified female breast  Z98.890 (ICD-10-CM) - Other specified postprocedural states   THERAPY DIAG:  Axillary web syndrome  S/P bilateral mastectomy  Lymphedema of right arm  Rationale for Evaluation and Treatment: Rehabilitation  ONSET DATE: Feb 1st 2025  SUBJECTIVE:                                                                                                                                                                                          SUBJECTIVE STATEMENT: Pt reports her surgery went well on Monday.   Accompanied by: self  PERTINENT HISTORY: Per MEDICAL RECORD NUMBER Pt diagnosed with malignant neoplasm of upper-outer quadrant of right breast in female, estrogen receptor negative (HCC) 10/22/2022.  Pt is s/p bilat mastectomy and reconstruction with expanders in place 05/08/2023. Currently receiving p.o. chemo therapy with Xeloda, which she will finish early June.  PAIN: 09/10/23: 4/10 post surgical pain beneath the breasts (surgical incision) Are you having pain? None at rest; pain present with activity  NPRS scale: 5/10 Pain location: R volar arm from the shoulder to the wrist  Pain orientation: Right  PAIN TYPE: sharp, tight Pain description: intermittent/with activity Aggravating factors: extending the elbow/reaching into flexion and abd Relieving factors: rest   PRECAUTIONS: Lymphedema precautions: avoid needle sticks/BPs to RUE.  Pt is currently limiting herself to lifting no more than 40# at work.  Pt is currently undergoing chemotherapy p.o.  RED FLAGS: None   WEIGHT BEARING RESTRICTIONS: No  FALLS:  Has patient fallen in last 6 months? 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  OBJECTIVE: Note: Objective measures were completed at Evaluation unless otherwise noted.  COGNITION: Overall cognitive status: Within functional limits for tasks assessed   PALPATION: Mild cording R volar forearm, extending to upper arm and axilla.   OBSERVATIONS / OTHER ASSESSMENTS:  RUE: No visible swelling, but tenderness with palpation at the biceps and volar forearm.  Skin is taught throughout the RUE from the upper arm to the distal forearm; non-pitting.  Mild cording R volar forearm, extending to upper arm and axilla; LUE: non-tender, supple  SENSATION: reports tingling in the ulnar nerve distribution of RUE only with prolonged positioning of elbow in flexion (when playing video games or holding phone).  POSTURE: No significant postural limitations  UPPER EXTREMITY AROM/PROM: (Per PT eval on 08/07/23)  AROM RIGHT   eval   Shoulder extension   Shoulder flexion 135  Shoulder abduction 119  Shoulder internal rotation Observed WNL  Shoulder external rotation 65  Elbow extension -10    (Blank rows = not tested)  A/PROM LEFT   eval  Shoulder extension   Shoulder flexion 150  Shoulder abduction 137  Shoulder internal rotation Observed WNL  Shoulder external rotation 60  Elbow extension 0    (Blank rows = not tested)  UPPER EXTREMITY STRENGTH: RUE grossly 4/5, LUE grossly 4+/5 (Per PT eval on 08/07/23)  LYMPHEDEMA ASSESSMENTS:   SURGERY TYPE/DATE: 05/08/2023 bilat mastectomy with reconstruction   NUMBER OF LYMPH NODES REMOVED: SLNB: 2 axillary lymph nodes removed   CHEMOTHERAPY: 16 rounds ending early/mid Oct 2024, now has a chemo pill 2x/day for 14 days, 1 week off for 8 cycles, ending in June  RADIATION: None  HORMONE TREATMENT:  None  INFECTIONS: None  LYMPHEDEMA ASSESSMENTS:   UE circumferential measurements in cm  Landmark R Date: 08/25/23 L Date: 08/25/23 R Date: R Date:  1  MP (distal base of SF) 18.7 17.9    2  Wrist crease 15.5 15.0    3 Forearm (*) +4 cm 15.0 14.5    4 * +4 cm 17.3 16.4    5 * +4 cm 20.0 18.8    6 * +4 cm 21.9 21.5    7 * +4 cm 22.8 22.7    8  Elbow crease 22.2 22.7    9 Upper arm (**) +4 cm 22.0 21.8    10 ** +4 cm 22.2 22.5    11 ** +4 cm 23.7 23.8    12 ** +4 cm 25.4 25.5                      Total circumferential cm 246.7 cm 243.1 cm      Total Limb  differential 3.6 cm  (R 1.5% larger than L; all  points of measure are <2 cm differential throughout RUE/LUE)        Change from initial        TODAY'S TREATMENT:  DATE: 09/10/23 Manual Therapy: -Scar massage to R axilla -Soft tissue massage proximal>distal to target cording on volar side of RUE; Reviewed self massage technique  Self Care: -Condition management: Reviewed indications for wearing compression (issued isotoner glove size small for R hand) -Reviewed indications for performing MLD outside of therapy -Issued self MLD handout and reviewed sequence/technique to route fluid proximal<>distal throughout RUE, moving fluid toward L axilla across the chest.   PATIENT EDUCATION:  Education details: Self MLD techniques, scar massage, soft tissue massage for cording; condition management Person educated: Patient Education method: Explanation and Verbal cues (written handout issued for self MLD) Education comprehension: verbalized understanding, further training needed  HOME EXERCISE PROGRAM: AWS exercises  ASSESSMENT:  CLINICAL IMPRESSION: Shortened tx session this date as pt's ride was late.  Pt reported her sx for spacer removal with exchange for breast implants went well on Monday.  Pt reports  pain at incision site to be 4/10, which she states is much less than her first breast implant sx before her cancer.  OT reviewed post surgical activity precautions with pt.  Pt is currently limiting reach to shoulder level and below until follow up with surgeon, and is maintaining this consistently.  Pt reports that her mom and sister have been unavailable to assist with manual therapy for AWS to the RUE, and pt currently is limited with her ability to reach LUE over to the R to perform self soft tissue massage.  Pt tolerated manual therapy from OT very well today.  Cording down the RUE continues to be a problem area.  Reviewed self MLD techniques with written handout.  Pt advised to review handout this week and avoid practice with MLD at this time d/t limited reach with her BUEs and to avoid any massage across the chest as to avoid any skin stretching that may compromise incisions.  Pt verbalized understanding.  Pt has been in contact with [facility] and has ordered an xs UE compression sleeve as this was not in stock last week.  Will plan to continue with manual therapy to release cording in the RUE, as well as continue to review self MLD techniques and condition management until goals are met.  Pt in agreement with plan.  OBJECTIVE IMPAIRMENTS: decreased knowledge of condition, decreased knowledge of use of DME, decreased ROM, decreased strength, increased edema, increased fascial restrictions, impaired flexibility, impaired sensation, impaired UE functional use, and pain.   ACTIVITY LIMITATIONS: carrying, lifting, dressing, reach over head, and caring for others  PARTICIPATION LIMITATIONS: occupation  PERSONAL FACTORS: 1-2 comorbidities: bilat mastectomy, reconstruction  are also affecting patient's functional outcome.   REHAB POTENTIAL: Excellent  CLINICAL DECISION MAKING: Evolving/moderate complexity  EVALUATION COMPLEXITY: Moderate  GOALS: Goals reviewed with patient? Yes  SHORT TERM GOALS:  Target date: 09/15/23   1.  Pt will be indep to verbalize 2-3 lymphedema precautions to prevent infection and lymphedema flare ups. Baseline: Eval: Initiated educ on optimal skin care; further training needed Goal status: INITIAL  2.  Pt will be indep to verbalize 1-2 signs of lymphedema flare ups and identify subsequent action steps. Baseline: Eval: Educ not yet initiated. Goal status: INITIAL  LONG TERM GOALS: Target date: 09/22/23  Pt will be indep to perform HEP for reducing R AWS and promoting RUE lymphatic drainage with use of visual handouts. Baseline: Eval: HEP not yet initiated Goal status: INITIAL  2.  Pt will demonstrate self MLD technique with 75% accuracy using visual handouts as needed to promote lymphatic drainage throughout  the RUE. Baseline: Eval: Instruction not yet initiated Goal status: INITIAL  3.  Pt will demonstrate soft tissue massage technique with 75% accuracy using visual/written handouts as needed to reduce/release R AWS in the RUE. Baseline: Eval: Not yet initiated. Goal status: INITIAL  4.  Pt will demonstrate independence with donning/doffing class 1 compression sleeve and gauntlet to the RUE. Baseline: Eval: Not yet obtained Goal status: INITIAL  5.  Pt will be demonstrate 75% or better adherence to recommended wearing schedule for compression sleeve/glove. Baseline: Eval: Not yet obtained/wearing schedule not yet reviewed Goal status: INITIAL  PLAN:  OT FREQUENCY: 1-2x/week  OT DURATION: 4 weeks  PLANNED INTERVENTIONS: 13086- OT Re-Evaluation, 97110-Therapeutic exercises, 97530- Therapeutic activity, 97535- Self Care, 57846- Manual therapy, 97760- Orthotic Fit/training, Patient/Family education, Joint mobilization, Manual lymph drainage, Scar mobilization, Compression bandaging, DME instructions, Cryotherapy, and Moist heat  PLAN FOR NEXT SESSION: manual therapy   Danelle Earthly, MS, OTR/L, CLT  Otis Dials, OT 09/14/2023, 9:22 AM

## 2023-09-15 ENCOUNTER — Ambulatory Visit: Payer: Medicaid Other

## 2023-09-15 DIAGNOSIS — I890 Lymphedema, not elsewhere classified: Secondary | ICD-10-CM

## 2023-09-15 DIAGNOSIS — Z9013 Acquired absence of bilateral breasts and nipples: Secondary | ICD-10-CM

## 2023-09-15 DIAGNOSIS — L905 Scar conditions and fibrosis of skin: Secondary | ICD-10-CM

## 2023-09-15 DIAGNOSIS — M25621 Stiffness of right elbow, not elsewhere classified: Secondary | ICD-10-CM | POA: Diagnosis not present

## 2023-09-15 NOTE — Therapy (Signed)
 OUTPATIENT OCCUPATIONAL THERAPY LYMPHEDEMA TREATMENT NOTE  Patient Name: Eileen Hart MRN: [id number] DOB:[birth year], 33 y.o., female Today's Date: 09/15/2023  END OF SESSION:   OT End of Session - 09/15/23 1219     Visit Number 5    Number of Visits 6    Date for OT Re-Evaluation 09/22/23    Authorization Type [place] MEDICAID PREPAID HEALTH PLAN    OT Start Time 1020    OT Stop Time 1100    OT Time Calculation (min) 40 min    Equipment Utilized During Treatment None    Activity Tolerance Patient tolerated treatment well    Behavior During Therapy WFL for tasks assessed/performed            Past Medical History:  Diagnosis Date   Chlamydia 01/25/2010   Depression with anxiety    Gestational hypertension    resolved after delivery   Immunization, viral disease    gardasil series completed   Invasive carcinoma of breast (HCC)    right   Tobacco abuse 05/02/2016   Past Surgical History:  Procedure Laterality Date   AUGMENTATION MAMMAPLASTY Bilateral    saline implants 2015   BREAST BIOPSY Right 10/14/2022   US biopsy/ coil clip/ path pending   BREAST BIOPSY Right 10/14/2022   [place] RT BREAST BX W LOC DEV 1ST LESION IMG BX SPEC US GUIDE 10/14/2022 [facility]   BREAST BIOPSY Left 10/29/2022   [place] Core Bx Ribbon clip- path pending   BREAST BIOPSY Right 10/29/2022   [place] Core 1130 Venus Clip- path pending   BREAST BIOPSY Right 10/29/2022   [place] Core Bx Retroareolar heart clip-path pending   BREAST BIOPSY Right 10/29/2022   [place] Core Bx Ribbon Clip path pending   BREAST BIOPSY Right 10/29/2022   [place] RT BREAST BX W LOC DEV EA ADD LESION IMG BX SPEC US GUIDE 10/29/2022 [facility]   BREAST BIOPSY Right 10/29/2022   [place] RT BREAST BX W LOC DEV 1ST LESION IMG BX SPEC US GUIDE 10/29/2022 [facility]   BREAST BIOPSY Right 10/29/2022   [place] RT BREAST BX W LOC DEV EA ADD LESION IMG BX SPEC US GUIDE 10/29/2022 [facility]   BREAST BIOPSY Left 10/29/2022   [place] LT BREAST BX W  LOC DEV 1ST LESION IMG BX SPEC US GUIDE 10/29/2022 [facility]   BREAST ENHANCEMENT SURGERY  2015   BREAST RECONSTRUCTION WITH PLACEMENT OF TISSUE EXPANDER AND FLEX HD (ACELLULAR HYDRATED DERMIS) Bilateral 05/08/2023   Procedure: Bilateral immediate breast reconstruction with expanders and Flex HD placement;  Surgeon: Peggye Form, DO;  Location: [facility];  Service: Plastics;  Laterality: Bilateral;   CESAREAN SECTION  2014   MASTECTOMY W/ SENTINEL NODE BIOPSY Bilateral 05/08/2023   Procedure: MASTECTOMY Simple WITH SENTINEL LYMPH NODE BIOPSY, RNFA to assist;  Surgeon: Leafy Ro, MD;  Location: [facility];  Service: General;  Laterality: Bilateral;   PORT-A-CATH REMOVAL  04/2023   PORTACATH PLACEMENT N/A 10/24/2022   PortA Cath removed; Procedure: INSERTION PORT-A-CATH;  Surgeon: Leafy Ro, MD;  Location: [facility];  Service: General;  Laterality: N/A;   REMOVAL OF BILATERAL TISSUE EXPANDERS WITH PLACEMENT OF BILATERAL BREAST IMPLANTS Bilateral 09/08/2023   Procedure: REMOVAL, TISSUE EXPANDER, BREAST, BILATERAL, WITH BILATERAL IMPLANT IMPLANT INSERTION;  Surgeon: Peggye Form, DO;  Location: [facility];  Service: Plastics;  Laterality: Bilateral;   TONSILLECTOMY     Patient Active Problem List   Diagnosis Date Noted   S/P bilateral mastectomy 05/08/2023   Genetic testing  10/30/2022   Invasive carcinoma of breast (HCC) 10/22/2022   Malignant neoplasm of upper-outer quadrant of right breast in female, estrogen receptor negative (HCC) 10/22/2022   Lump of right breast 09/18/2022   Weight gain 12/04/2020   Right wrist pain 09/27/2019   Contraceptive management 07/07/2019   Anxiety and depression 12/29/2015   PCP: Dr. Birdie Sons (Pt reports this MD just left the practice, but she will continue with this practice with another MD)  REFERRING PROVIDER: Keenan Bachelor, J, PA  REFERRING DIAG:  [phone] (ICD-10-CM) - Malignant neoplasm of upper-outer quadrant of right female  breast  Z17.1 (ICD-10-CM) - Estrogen receptor negative status (ER-)  C50.919 (ICD-10-CM) - Malignant neoplasm of unspecified site of unspecified female breast  Z98.890 (ICD-10-CM) - Other specified postprocedural states   THERAPY DIAG:  Axillary web syndrome  S/P bilateral mastectomy  Lymphedema of right arm  Rationale for Evaluation and Treatment: Rehabilitation  ONSET DATE: Feb 1st 2025  SUBJECTIVE:                                                                                                                                                                                          SUBJECTIVE STATEMENT: Pt reports doing well today and reports the cording in her R arm appears to be improving.  Accompanied by: self  PERTINENT HISTORY: Per MEDICAL RECORD NUMBER Pt diagnosed with malignant neoplasm of upper-outer quadrant of right breast in female, estrogen receptor negative (HCC) 10/22/2022.  Pt is s/p bilat mastectomy and reconstruction with expanders in place 05/08/2023. Currently receiving p.o. chemo therapy with Xeloda, which she will finish early June.  PAIN: 09/15/23: 2-3/10 R volar elbow  Are you having pain? None at rest; pain present with activity  NPRS scale: 5/10 Pain location: R volar arm from the shoulder to the wrist  Pain orientation: Right  PAIN TYPE: sharp, tight Pain description: intermittent/with activity Aggravating factors: extending the elbow/reaching into flexion and abd Relieving factors: rest   PRECAUTIONS: Lymphedema precautions: avoid needle sticks/BPs to RUE.  Pt is currently limiting herself to lifting no more than 40# at work.  Pt is currently undergoing chemotherapy p.o.  RED FLAGS: None   WEIGHT BEARING RESTRICTIONS: No  FALLS:  Has patient fallen in last 6 months? 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  OBJECTIVE: Note: Objective measures were completed at Evaluation unless otherwise noted.  COGNITION: Overall cognitive status: Within functional limits for tasks assessed   PALPATION: Mild cording R volar forearm, extending to upper arm and axilla.   OBSERVATIONS / OTHER ASSESSMENTS:  RUE: No visible swelling, but tenderness with palpation at the biceps and volar forearm.  Skin is taught throughout the RUE from the upper arm to the distal forearm; non-pitting.  Mild cording R volar forearm, extending to upper arm and axilla; LUE: non-tender, supple  SENSATION: reports tingling in the ulnar nerve distribution of RUE only with prolonged positioning of elbow in flexion (when playing video games or holding phone).  POSTURE: No significant postural limitations  UPPER EXTREMITY AROM/PROM: (Per PT eval on 08/07/23)  AROM RIGHT   eval   Shoulder extension   Shoulder flexion 135  Shoulder abduction 119  Shoulder internal rotation Observed WNL  Shoulder external rotation 65  Elbow extension -10    (Blank rows = not tested)  A/PROM LEFT   eval  Shoulder extension   Shoulder flexion 150  Shoulder abduction 137  Shoulder internal rotation Observed WNL  Shoulder external rotation 60  Elbow extension 0    (Blank rows = not tested)  UPPER EXTREMITY STRENGTH: RUE grossly 4/5, LUE grossly 4+/5 (Per PT eval on 08/07/23)  LYMPHEDEMA ASSESSMENTS:   SURGERY TYPE/DATE: 05/08/2023 bilat mastectomy with reconstruction   NUMBER OF LYMPH NODES REMOVED: SLNB: 2 axillary lymph nodes removed   CHEMOTHERAPY: 16 rounds ending early/mid Oct 2024, now has a chemo pill 2x/day for 14 days, 1 week off for 8 cycles, ending in June  RADIATION: None  HORMONE TREATMENT:  None  INFECTIONS: None  LYMPHEDEMA ASSESSMENTS:   UE circumferential measurements in cm  Landmark R Date: 08/25/23 L Date: 08/25/23 R Date: R Date:  1  MP (distal base of SF) 18.7 17.9    2  Wrist crease 15.5 15.0    3 Forearm (*) +4 cm 15.0 14.5    4 * +4 cm 17.3 16.4    5 * +4 cm 20.0 18.8    6 * +4 cm 21.9 21.5    7 * +4 cm 22.8 22.7    8  Elbow crease 22.2 22.7    9 Upper arm (**) +4 cm 22.0 21.8    10 ** +4 cm 22.2 22.5    11 ** +4 cm 23.7 23.8    12 ** +4 cm 25.4 25.5                      Total circumferential cm 246.7 cm 243.1 cm      Total Limb  differential 3.6 cm  (R 1.5% larger than L; all  points of measure are <2 cm differential throughout RUE/LUE)        Change from initial        TODAY'S TREATMENT:  DATE: 09/15/23 Manual Therapy: -Scar massage to R axilla -Soft tissue massage proximal>distal to target cording on volar side of RUE. -Reviewed self MLD techniques/routing -To further target RUE cording: Performed MLD to the RUE, working proximal>distal>proximal, routing lymph fluid across chest toward L axilla; gentle pressure across the chest and while clearing R/L axillary nodes and abdomen to avoid any discomfort at surgical incisions beneath B breasts.    PATIENT EDUCATION:  Education details: Self MLD techniques Person educated: Patient Education method: Explanation and Verbal cues, demo Education comprehension: verbalized understanding, further training needed  HOME EXERCISE PROGRAM: AWS exercises  ASSESSMENT: CLINICAL IMPRESSION: Pt reports the cording in her R arm appears to be improving; improvement also observed and palpated by OT this date.  Still mild pain at the R volar elbow, but cording less palpable in the biceps and volar forearm today.  Pt tolerated MLD well today.  Pt requiring min vc for self MLD techniques when  reviewing techniques verbally.  Pt reported that she can't quite perform these on herself yet d/t post surgical limitations.  OT in agreement for pt to avoid self MLD attempts, as OT will continue to perform MLD and other manual therapy techniques noted above during OT sessions to continue to release cording throughout the RUE.  Pt in agreement with plan.  Pt plans to pick up compression sleeve this week.    OBJECTIVE IMPAIRMENTS: decreased knowledge of condition, decreased knowledge of use of DME, decreased ROM, decreased strength, increased edema, increased fascial restrictions, impaired flexibility, impaired sensation, impaired UE functional use, and pain.   ACTIVITY LIMITATIONS: carrying, lifting, dressing, reach over head, and caring for others  PARTICIPATION LIMITATIONS: occupation  PERSONAL FACTORS: 1-2 comorbidities: bilat mastectomy, reconstruction  are also affecting patient's functional outcome.   REHAB POTENTIAL: Excellent  CLINICAL DECISION MAKING: Evolving/moderate complexity  EVALUATION COMPLEXITY: Moderate  GOALS: Goals reviewed with patient? Yes  SHORT TERM GOALS: Target date: 09/15/23   1.  Pt will be indep to verbalize 2-3 lymphedema precautions to prevent infection and lymphedema flare ups. Baseline: Eval: Initiated educ on optimal skin care; further training needed Goal status: INITIAL  2.  Pt will be indep to verbalize 1-2 signs of lymphedema flare ups and identify subsequent action steps. Baseline: Eval: Educ not yet initiated. Goal status: INITIAL  LONG TERM GOALS: Target date: 09/22/23  Pt will be indep to perform HEP for reducing R AWS and promoting RUE lymphatic drainage with use of visual handouts. Baseline: Eval: HEP not yet initiated Goal status: INITIAL  2.  Pt will demonstrate self MLD technique with 75% accuracy using visual handouts as needed to promote lymphatic drainage throughout the RUE. Baseline: Eval: Instruction not yet initiated Goal  status: INITIAL  3.  Pt will demonstrate soft tissue massage technique with 75% accuracy using visual/written handouts as needed to reduce/release R AWS in the RUE. Baseline: Eval: Not yet initiated. Goal status: INITIAL  4.  Pt will demonstrate independence with donning/doffing class 1 compression sleeve and gauntlet to the RUE. Baseline: Eval: Not yet obtained Goal status: INITIAL  5.  Pt will be demonstrate 75% or better adherence to recommended wearing schedule for compression sleeve/glove. Baseline: Eval: Not yet obtained/wearing schedule not yet reviewed Goal status: INITIAL  PLAN: Shortened tx session this date as pt's ride was late.  Pt reported her sx for spacer removal with exchange for breast implants went well on Monday.  Pt reports pain at incision site to be 4/10, which she states is much less  than her first breast implant sx before her cancer.  OT reviewed post surgical activity precautions with pt.  Pt is currently limiting reach to shoulder level and below until follow up with surgeon, and is maintaining this consistently.  Pt reports that her mom and sister have been unavailable to assist with manual therapy for AWS to the RUE, and pt currently is limited with her ability to reach LUE over to the R to perform self soft tissue massage.  Pt tolerated manual therapy from OT very well today.  Cording down the RUE continues to be a problem area.  Reviewed self MLD techniques with written handout.  Pt advised to review handout this week and avoid practice with MLD at this time d/t limited reach with her BUEs and to avoid any massage across the chest as to avoid any skin stretching that may compromise incisions.  Pt verbalized understanding.  Pt has been in contact with [facility] and has ordered an xs UE compression sleeve as this was not in stock last week.  Will plan to continue with manual therapy to release cording in the RUE, as well as continue to review self MLD techniques and  condition management until goals are met.  Pt in agreement with plan.  OT FREQUENCY: 1-2x/week  OT DURATION: 4 weeks  PLANNED INTERVENTIONS: 82956- OT Re-Evaluation, 97110-Therapeutic exercises, 97530- Therapeutic activity, 97535- Self Care, 21308- Manual therapy, 97760- Orthotic Fit/training, Patient/Family education, Joint mobilization, Manual lymph drainage, Scar mobilization, Compression bandaging, DME instructions, Cryotherapy, and Moist heat  PLAN FOR NEXT SESSION: manual therapy   Danelle Earthly, MS, OTR/L, CLT  Otis Dials, OT 09/15/2023, 12:21 PM

## 2023-09-16 ENCOUNTER — Ambulatory Visit (INDEPENDENT_AMBULATORY_CARE_PROVIDER_SITE_OTHER): Admitting: Plastic Surgery

## 2023-09-16 ENCOUNTER — Encounter: Admitting: Surgical

## 2023-09-16 ENCOUNTER — Other Ambulatory Visit: Payer: Self-pay

## 2023-09-16 ENCOUNTER — Other Ambulatory Visit: Payer: Self-pay | Admitting: Oncology

## 2023-09-16 ENCOUNTER — Encounter: Payer: Self-pay | Admitting: Oncology

## 2023-09-16 ENCOUNTER — Encounter: Payer: Self-pay | Admitting: Plastic Surgery

## 2023-09-16 ENCOUNTER — Ambulatory Visit: Payer: Medicaid Other | Admitting: Physical Therapy

## 2023-09-16 ENCOUNTER — Other Ambulatory Visit (HOSPITAL_COMMUNITY): Payer: Self-pay

## 2023-09-16 VITALS — BP 120/84 | HR 95

## 2023-09-16 DIAGNOSIS — Z9013 Acquired absence of bilateral breasts and nipples: Secondary | ICD-10-CM

## 2023-09-16 DIAGNOSIS — C50411 Malignant neoplasm of upper-outer quadrant of right female breast: Secondary | ICD-10-CM

## 2023-09-16 NOTE — Progress Notes (Signed)
 Specialty Pharmacy Refill Coordination Note  Eileen Hart is a 33 y.o. female contacted today regarding refills of specialty medication(s) Capecitabine (XELODA)   Patient requested Delivery   Delivery date: 09/19/23   Verified address: [address]   [place] [address]   Medication will be filled on 03.27.25.

## 2023-09-16 NOTE — Progress Notes (Signed)
 Specialty Pharmacy Ongoing Clinical Assessment Note  Eileen Hart is a 33 y.o. female who is being followed by the specialty pharmacy service for RxSp Oncology   Patient's specialty medication(s) reviewed today: Capecitabine (XELODA)   Missed doses in the last 4 weeks: All (doses were held for approximately 3 weeks due to procedure)   Patient/Caregiver did not have any additional questions or concerns.   Therapeutic benefit summary: Unable to assess   Adverse events/side effects summary: Experienced adverse events/side effects (peeling of skin on hands/feet. applying voltaren gel, 20% urea cream, and alternating gold bond and udderly smooth)   Patient's therapy is appropriate to: Continue    Goals Addressed   None     Follow up:  3 months  Otto Herb Specialty Pharmacist

## 2023-09-16 NOTE — Progress Notes (Signed)
 The patient is a 33 year old female here for follow-up after undergoing breast reconstruction.  On March 17 she had removal of her expanders and placement of implants.  She now has Mentor smooth round ultrahigh profile gel 400 cc implants in both breasts.  Today she is doing extremely well.  No complaints.  No pain.  No sign of hematoma or seroma.  She is wearing a sports bra.  Continue and we will plan to see her back in 2 weeks.  Pictures were obtained of the patient and placed in the chart with the patient's or guardian's permission.

## 2023-09-17 ENCOUNTER — Encounter: Payer: Self-pay | Admitting: Oncology

## 2023-09-17 ENCOUNTER — Other Ambulatory Visit: Payer: Self-pay

## 2023-09-17 ENCOUNTER — Ambulatory Visit: Payer: Medicaid Other

## 2023-09-17 MED ORDER — CAPECITABINE 500 MG PO TABS
1500.0000 mg | ORAL_TABLET | Freq: Two times a day (BID) | ORAL | 1 refills | Status: DC
  Filled 2023-09-17: qty 84, 21d supply, fill #0
  Filled 2023-10-07 (×2): qty 84, 21d supply, fill #1

## 2023-09-18 ENCOUNTER — Ambulatory Visit

## 2023-09-18 DIAGNOSIS — M25621 Stiffness of right elbow, not elsewhere classified: Secondary | ICD-10-CM | POA: Diagnosis not present

## 2023-09-18 DIAGNOSIS — I890 Lymphedema, not elsewhere classified: Secondary | ICD-10-CM

## 2023-09-18 DIAGNOSIS — L905 Scar conditions and fibrosis of skin: Secondary | ICD-10-CM

## 2023-09-18 DIAGNOSIS — Z9013 Acquired absence of bilateral breasts and nipples: Secondary | ICD-10-CM

## 2023-09-18 NOTE — Therapy (Signed)
 OUTPATIENT OCCUPATIONAL THERAPY LYMPHEDEMA RECERTIFICATION AND TREATMENT NOTE  Patient Name: Eileen Hart MRN: [id number] DOB:08-04-1990, 33 y.o., female Today's Date: 09/18/2023  END OF SESSION:   OT End of Session - 09/18/23 1023     Visit Number 6    Number of Visits 18    Date for OT Re-Evaluation 10/30/23    Authorization Type [place] MEDICAID PREPAID HEALTH PLAN    Progress Note Due on Visit 10    OT Start Time 0802    OT Stop Time [phone]    OT Time Calculation (min) 41 min    Equipment Utilized During Treatment None    Activity Tolerance Patient tolerated treatment well    Behavior During Therapy WFL for tasks assessed/performed            Past Medical History:  Diagnosis Date   Chlamydia 01/25/2010   Depression with anxiety    Gestational hypertension    resolved after delivery   Immunization, viral disease    gardasil series completed   Invasive carcinoma of breast (HCC)    right   Tobacco abuse 05/02/2016   Past Surgical History:  Procedure Laterality Date   AUGMENTATION MAMMAPLASTY Bilateral    saline implants 2015   BREAST BIOPSY Right 10/14/2022   US biopsy/ coil clip/ path pending   BREAST BIOPSY Right 10/14/2022   [place] RT BREAST BX W LOC DEV 1ST LESION IMG BX SPEC US GUIDE 10/14/2022 [facility]   BREAST BIOPSY Left 10/29/2022   [place] Core Bx Ribbon clip- path pending   BREAST BIOPSY Right 10/29/2022   [place] Core 1130 Venus Clip- path pending   BREAST BIOPSY Right 10/29/2022   [place] Core Bx Retroareolar heart clip-path pending   BREAST BIOPSY Right 10/29/2022   [place] Core Bx Ribbon Clip path pending   BREAST BIOPSY Right 10/29/2022   [place] RT BREAST BX W LOC DEV EA ADD LESION IMG BX SPEC US GUIDE 10/29/2022 [facility]   BREAST BIOPSY Right 10/29/2022   [place] RT BREAST BX W LOC DEV 1ST LESION IMG BX SPEC US GUIDE 10/29/2022 [facility]   BREAST BIOPSY Right 10/29/2022   [place] RT BREAST BX W LOC DEV EA ADD LESION IMG BX SPEC US GUIDE 10/29/2022 [facility]    BREAST BIOPSY Left 10/29/2022   [place] LT BREAST BX W LOC DEV 1ST LESION IMG BX SPEC US GUIDE 10/29/2022 [facility]   BREAST ENHANCEMENT SURGERY  2015   BREAST RECONSTRUCTION WITH PLACEMENT OF TISSUE EXPANDER AND FLEX HD (ACELLULAR HYDRATED DERMIS) Bilateral 05/08/2023   Procedure: Bilateral immediate breast reconstruction with expanders and Flex HD placement;  Surgeon: Peggye Form, DO;  Location: [facility];  Service: Plastics;  Laterality: Bilateral;   CESAREAN SECTION  2014   MASTECTOMY W/ SENTINEL NODE BIOPSY Bilateral 05/08/2023   Procedure: MASTECTOMY Simple WITH SENTINEL LYMPH NODE BIOPSY, RNFA to assist;  Surgeon: Leafy Ro, MD;  Location: [facility];  Service: General;  Laterality: Bilateral;   PORT-A-CATH REMOVAL  04/2023   PORTACATH PLACEMENT N/A 10/24/2022   PortA Cath removed; Procedure: INSERTION PORT-A-CATH;  Surgeon: Leafy Ro, MD;  Location: [facility];  Service: General;  Laterality: N/A;   REMOVAL OF BILATERAL TISSUE EXPANDERS WITH PLACEMENT OF BILATERAL BREAST IMPLANTS Bilateral 09/08/2023   Procedure: REMOVAL, TISSUE EXPANDER, BREAST, BILATERAL, WITH BILATERAL IMPLANT IMPLANT INSERTION;  Surgeon: Peggye Form, DO;  Location: [facility];  Service: Plastics;  Laterality: Bilateral;   TONSILLECTOMY     Patient Active Problem List   Diagnosis Date  Noted   S/P bilateral mastectomy 05/08/2023   Genetic testing 10/30/2022   Invasive carcinoma of breast (HCC) 10/22/2022   Malignant neoplasm of upper-outer quadrant of right breast in female, estrogen receptor negative (HCC) 10/22/2022   Lump of right breast 09/18/2022   Weight gain 12/04/2020   Right wrist pain 09/27/2019   Contraceptive management 07/07/2019   Anxiety and depression 12/29/2015   PCP: Dr. Birdie Sons (Pt reports this MD just left the practice, but she will continue with this practice with another MD)  REFERRING PROVIDER: Keenan Bachelor, J, PA  REFERRING DIAG:  [phone] (ICD-10-CM) -  Malignant neoplasm of upper-outer quadrant of right female breast  Z17.1 (ICD-10-CM) - Estrogen receptor negative status (ER-)  C50.919 (ICD-10-CM) - Malignant neoplasm of unspecified site of unspecified female breast  Z98.890 (ICD-10-CM) - Other specified postprocedural states   THERAPY DIAG:  Axillary web syndrome  S/P bilateral mastectomy  Lymphedema of right arm  Rationale for Evaluation and Treatment: Rehabilitation  ONSET DATE: Feb 1st 2025  SUBJECTIVE:                                                                                                                                                                                          SUBJECTIVE STATEMENT: Pt reports she had a good follow up with her surgeon this week and has been given the ok to restart AWS exercises to her tolerance.  Accompanied by: self  PERTINENT HISTORY: Per MEDICAL RECORD NUMBER Pt diagnosed with malignant neoplasm of upper-outer quadrant of right breast in female, estrogen receptor negative (HCC) 10/22/2022.  Pt is s/p bilat mastectomy and reconstruction with expanders in place 05/08/2023. Currently receiving p.o. chemo therapy with Xeloda, which she will finish early June.  PAIN: 09/18/23: 2-3/10 R volar elbow  Are you having pain? None at rest; pain present with activity  NPRS scale: 5/10 Pain location: R volar arm from the shoulder to the wrist  Pain orientation: Right  PAIN TYPE: sharp, tight Pain description: intermittent/with activity Aggravating factors: extending the elbow/reaching into flexion and abd Relieving factors: rest   PRECAUTIONS: Lymphedema precautions: avoid needle sticks/BPs to RUE.  Pt is currently limiting herself to lifting no more than 40# at work.  Pt is currently undergoing chemotherapy p.o.  RED FLAGS: None   WEIGHT BEARING RESTRICTIONS: No  FALLS:  Has patient fallen in last 6 months? 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  OBJECTIVE: Note: Objective measures were completed at Evaluation unless otherwise noted.  COGNITION: Overall cognitive status: Within functional limits for tasks assessed   PALPATION: Mild cording R volar forearm, extending to upper arm and axilla.   OBSERVATIONS / OTHER ASSESSMENTS:  RUE: No visible swelling, but tenderness with palpation at the biceps and volar forearm.  Skin is taught throughout the RUE from the upper arm to the distal forearm; non-pitting.  Mild cording R volar forearm, extending to upper arm and axilla; LUE: non-tender, supple  SENSATION: reports tingling in the ulnar nerve distribution of RUE only with prolonged positioning of elbow in flexion (when playing video games or holding phone).  POSTURE: No significant postural limitations  UPPER EXTREMITY AROM/PROM: (Per PT eval on 08/07/23)      09/18/23: Will remeasure RUE AROM next week to allow an additional week for incision healing  AROM RIGHT   eval   Shoulder extension   Shoulder flexion 135  Shoulder abduction 119  Shoulder internal rotation Observed WNL  Shoulder external rotation 65  Elbow extension -10    (Blank rows = not tested)  A/PROM LEFT   eval  Shoulder extension   Shoulder flexion 150  Shoulder abduction 137  Shoulder internal rotation Observed WNL  Shoulder external rotation 60  Elbow extension 0    (Blank rows = not tested)  UPPER EXTREMITY STRENGTH: RUE grossly 4/5, LUE grossly 4+/5 (Per PT eval on 08/07/23)  LYMPHEDEMA ASSESSMENTS:   SURGERY TYPE/DATE: 05/08/2023 bilat mastectomy with reconstruction   NUMBER OF LYMPH NODES REMOVED: SLNB: 2 axillary lymph  nodes removed   CHEMOTHERAPY: 16 rounds ending early/mid Oct 2024, now has a chemo pill 2x/day for 14 days, 1 week off for 8 cycles, ending in June  RADIATION: None  HORMONE TREATMENT: None  INFECTIONS: None  LYMPHEDEMA ASSESSMENTS:   UE circumferential measurements in cm  [place] R Date: 08/25/23 L Date: 08/25/23 R Date: R Date:  1  MP (distal base of SF) 18.7 17.9    2  Wrist crease 15.5 15.0    3 Forearm (*) +4 cm 15.0 14.5    4 * +4 cm 17.3 16.4    5 * +4 cm 20.0 18.8    6 * +4 cm 21.9 21.5    7 * +4 cm 22.8 22.7    8  Elbow crease 22.2 22.7    9 Upper arm (**) +4 cm 22.0 21.8    10 ** +4 cm 22.2 22.5    11 ** +4 cm 23.7 23.8    12 ** +4 cm 25.4 25.5                      Total circumferential cm 246.7 cm 243.1 cm      Total Limb  differential 3.6 cm  (R 1.5% larger than L; all  points of measure are <2 cm differential throughout RUE/LUE)        Change from initial        TODAY'S TREATMENT:  DATE: 09/18/23 Manual Therapy: -Scar massage to R axilla -Soft tissue massage proximal>distal to target cording on volar side of RUE. -Reviewed self MLD techniques/routing -To further target RUE cording: Performed MLD to the RUE, working proximal>distal>proximal, routing lymph fluid across chest toward L axilla; gentle pressure across the chest and while clearing R/L axillary nodes and abdomen to avoid any discomfort at surgical incisions beneath B breasts.    PATIENT EDUCATION:  Education details: progress towards OT Emergency planning/management officer Person educated: Patient Education method: Explanation Education comprehension: verbalized understanding  HOME EXERCISE PROGRAM: AWS exercises  ASSESSMENT: CLINICAL IMPRESSION: Pt reports she had a good follow up with her surgeon this week and has been given the ok to restart AWS exercises to her tolerance.  Will  remeasure R shoulder AROM next week to allow an additional week for incisions to heal and pt to gradually restart AWS exercises to her tolerance.  Cording in RUE is steadily improving, and pt reports improved ability to reach above her head.  Pt tolerated manual therapy well this date.  Tenderness still present at the R volar elbow, but cording is now less palpable in the forearm, and is mild in the volar elbow and upper arm.  Pain is gradually improving, noting a decrease from 5/10 at eval to now 2-3/10 pain in R volar elbow.  Pt planning to pick up compression sleeve today and was encouraged to wear consistently during the day until next OT visit to determine benefits of sleeve for pain and cording in the RUE.  Pt verbalized understanding.  Pt will benefit from continued OT to work towards goals in [employer], focusing on reducing cording with therapeutic exercises and manual therapy.  OBJECTIVE IMPAIRMENTS: decreased knowledge of condition, decreased knowledge of use of DME, decreased ROM, decreased strength, increased edema, increased fascial restrictions, impaired flexibility, impaired sensation, impaired UE functional use, and pain.   ACTIVITY LIMITATIONS: carrying, lifting, dressing, reach over head, and caring for others  PARTICIPATION LIMITATIONS: occupation  PERSONAL FACTORS: 1-2 comorbidities: bilat mastectomy, reconstruction  are also affecting patient's functional outcome.   REHAB POTENTIAL: Excellent  CLINICAL DECISION MAKING: Evolving/moderate complexity  EVALUATION COMPLEXITY: Moderate  GOALS: Goals reviewed with patient? Yes  SHORT TERM GOALS: Target date: 09/15/23   1.  Pt will be indep to verbalize 2-3 lymphedema precautions to prevent infection and lymphedema flare ups. Baseline: Eval: Initiated educ on optimal skin care; further training needed; 09/17/33: indep with precautions Goal status: achieved  2.  Pt will be indep to verbalize 1-2 signs of lymphedema flare ups and  identify subsequent action steps. Baseline: Eval: Educ not yet initiated; 09/18/23: indep with s/s of flare ups and action steps Goal status: achieved  LONG TERM GOALS: Target date: 10/30/23  Pt will be indep to perform HEP for reducing R AWS and promoting RUE lymphatic drainage with use of visual handouts. Baseline: Eval: HEP not yet initiated; 09/18/23: HEP has been on hold over the last 2 weeks since pt's sx; pt has been given the ok to restart exercises this week to her tolerance; will assess independence with HEP next week Goal status: ongoing  2.  Pt will demonstrate self MLD technique with 75% accuracy using visual handouts as needed to promote lymphatic drainage throughout the RUE. Baseline: Eval: Instruction not yet initiated; 09/18/23: Limited BUE ROM post breast implant sx last week, so self MLD not yet attempted.  Pt requires min-mod vc for sequencing when reviewing verbally Goal status: ongoing  3.  Pt will demonstrate  soft tissue massage technique with 75% accuracy using visual/written handouts as needed to reduce/release R AWS in the  RUE. Baseline: Eval: Not yet initiated; 09/18/23: indep with technique but currently limited with ability to perform self massage d/t limited ROM post breast sx last week, and family not consistently available to perform.   Goal status: ongoing  4.  Pt will demonstrate independence with donning/doffing class 1 compression sleeve and gauntlet to the RUE. Baseline: Eval: Not yet obtained; 09/18/23: Pt planning to pick up her sleeve today Goal status: ongoing  5.  Pt will demonstrate 75% or better adherence to recommended wearing schedule for compression sleeve/glove. Baseline: Eval: Not yet obtained/wearing schedule not yet reviewed; 09/18/23: Defer; Pt will be picking up sleeve today Goal status: ongoing   PLAN:  OT FREQUENCY: 2x/week  OT DURATION: 6 weeks  PLANNED INTERVENTIONS: 29528- OT Re-Evaluation, 97110-Therapeutic exercises, 97530-  Therapeutic activity, 97535- Self Care, 41324- Manual therapy, 97760- Orthotic Fit/training, Patient/Family education, Joint mobilization, Manual lymph drainage, Scar mobilization, Compression bandaging, DME instructions, Cryotherapy, and Moist heat  PLAN FOR NEXT SESSION: manual therapy, review AWS HEP   Danelle Earthly, MS, OTR/L, CLT  O[employer] Dials, OT 09/18/2023, 10:26 AM

## 2023-09-19 ENCOUNTER — Ambulatory Visit: Payer: Medicaid Other | Admitting: Physical Therapy

## 2023-09-22 ENCOUNTER — Ambulatory Visit: Payer: Medicaid Other

## 2023-09-22 ENCOUNTER — Ambulatory Visit: Payer: Medicaid Other | Admitting: Physical Therapy

## 2023-09-22 ENCOUNTER — Encounter: Payer: Self-pay | Admitting: Family Medicine

## 2023-09-22 DIAGNOSIS — M25621 Stiffness of right elbow, not elsewhere classified: Secondary | ICD-10-CM | POA: Diagnosis not present

## 2023-09-22 DIAGNOSIS — L7682 Other postprocedural complications of skin and subcutaneous tissue: Secondary | ICD-10-CM

## 2023-09-22 DIAGNOSIS — I890 Lymphedema, not elsewhere classified: Secondary | ICD-10-CM

## 2023-09-22 DIAGNOSIS — Z9013 Acquired absence of bilateral breasts and nipples: Secondary | ICD-10-CM

## 2023-09-22 NOTE — Therapy (Signed)
 OUTPATIENT OCCUPATIONAL THERAPY LYMPHEDEMA TREATMENT NOTE  Patient Name: Eileen Hart MRN: [id number] DOB:Mar 09, 1991, 33 y.o., female Today's Date: 09/22/2023  END OF SESSION:   OT End of Session - 09/22/23 1556     Visit Number 7    Number of Visits 18    Date for OT Re-Evaluation 10/30/23    Authorization Type [place] MEDICA[employer]PAID HE[employer] PLAN    Progress Note Due on Visit 10    OT Start Time 0930    OT Stop Time 1015    OT Time Calculation (min) 45 min    Equipment Utilized During Treatment None    Activity Tolerance Patient tolerated treatment well    Behavior During Therapy WFL for tasks assessed/performed            Past Medical History:  Diagnosis Date   Chlamydia 01/25/2010   Depression with anxiety    Gestational hypertension    resolved after delivery   Immunization, viral disease    gardasil series completed   Invasive carcinoma of breast (HCC)    right   Tobacco abuse 05/02/2016   Past Surgical History:  Procedure Laterality Date   AUGMENTATION MAMMAPLASTY Bilateral    saline implants 2015   BREAST BIOPSY Right 10/14/2022   US biopsy/ coil clip/ path pending   BREAST BIOPSY Right 10/14/2022   [place] RT BREAST BX W LOC DEV 1ST LESION IMG BX SPEC US GUIDE 10/14/2022 [facility]   BREAST BIOPSY Left 10/29/2022   [place] Core Bx Ribbon clip- path pending   BREAST BIOPSY Right 10/29/2022   [place] Core 1130 Venus Clip- path pending   BREAST BIOPSY Right 10/29/2022   [place] Core Bx Retroareolar heart clip-path pending   BREAST BIOPSY Right 10/29/2022   [place] Core Bx Ribbon Clip path pending   BREAST BIOPSY Right 10/29/2022   [place] RT BREAST BX W LOC DEV EA ADD LESION IMG BX SPEC US GUIDE 10/29/2022 [facility]   BREAST BIOPSY Right 10/29/2022   [place] RT BREAST BX W LOC DEV 1ST LESION IMG BX SPEC US GUIDE 10/29/2022 [facility]   BREAST BIOPSY Right 10/29/2022   [place] RT BREAST BX W LOC DEV EA ADD LESION IMG BX SPEC US GUIDE 10/29/2022 [facility]   BREAST BIOPSY Left  10/29/2022   [place] LT BREAST BX W LOC DEV 1ST LESION IMG BX SPEC US GUIDE 10/29/2022 [facility]   BREAST ENHANCEMENT SURGERY  2015   BREAST RECONSTRUCTION WITH PLACEMENT OF TISSUE EXPANDER AND FLEX HD (ACELLULAR HYDRATED DERMIS) Bilateral 05/08/2023   Procedure: Bilateral immediate breast reconstruction with expanders and Flex HD placement;  Surgeon: Peggye Form, DO;  Location: [facility];  Service: Plastics;  Laterality: Bilateral;   CESAREAN SECTION  2014   MASTECTOMY W/ SENTINEL NODE BIOPSY Bilateral 05/08/2023   Procedure: MASTECTOMY Simple WITH SENTINEL LYMPH NODE BIOPSY, RNFA to assist;  Surgeon: Leafy Ro, MD;  Location: [facility];  Service: General;  Laterality: Bilateral;   PORT-A-CATH REMOVAL  04/2023   PORTACATH PLACEMENT N/A 10/24/2022   PortA Cath removed; Procedure: INSERTION PORT-A-CATH;  Surgeon: Leafy Ro, MD;  Location: [facility];  Service: General;  Laterality: N/A;   REMOVAL OF BILATERAL TISSUE EXPANDERS WITH PLACEMENT OF BILATERAL BREAST IMPLANTS Bilateral 09/08/2023   Procedure: REMOVAL, TISSUE EXPANDER, BREAST, BILATERAL, WITH BILATERAL IMPLANT IMPLANT INSERTION;  Surgeon: Peggye Form, DO;  Location: [facility];  Service: Plastics;  Laterality: Bilateral;   TONSILLECTOMY     Patient Active Problem List   Diagnosis Date Noted  S/P bilateral mastectomy 05/08/2023   Genetic testing 10/30/2022   Invasive carcinoma of breast (HCC) 10/22/2022   Malignant neoplasm of upper-outer quadrant of right breast in female, estrogen receptor negative (HCC) 10/22/2022   Lump of right breast 09/18/2022   Weight gain 12/04/2020   Right wrist pain 09/27/2019   Contraceptive management 07/07/2019   Anxiety and depression 12/29/2015   PCP: Dr. Birdie Sons (Pt reports this MD just left the practice, but she will continue with this practice with another MD)  REFERRING PROVIDER: Keenan Bachelor, J, PA  REFERRING DIAG:  [phone] (ICD-10-CM) - Malignant neoplasm of  upper-outer quadrant of right female breast  Z17.1 (ICD-10-CM) - Estrogen receptor negative status (ER-)  C50.919 (ICD-10-CM) - Malignant neoplasm of unspecified site of unspecified female breast  Z98.890 (ICD-10-CM) - Other specified postprocedural states   THERAPY DIAG:  Axillary web syndrome  S/P bilateral mastectomy  Lymphedema of right arm  Rationale for Evaluation and Treatment: Rehabilitation  ONSET DATE: Feb 1st 2025  SUBJECTIVE:                                                                                                                                                                                          SUBJECTIVE STATEMENT: Pt reports some ongoing discomfort at the R volar elbow and biceps. Accompanied by: self  PERTINENT HISTORY: Per MEDICAL RECORD NUMBER Pt diagnosed with malignant neoplasm of upper-outer quadrant of right breast in female, estrogen receptor negative (HCC) 10/22/2022.  Pt is s/p bilat mastectomy and reconstruction with expanders in place 05/08/2023. Currently receiving p.o. chemo therapy with Xeloda, which she will finish early June.  PAIN: 09/22/23: 2-3/10 R volar elbow, biceps Are you having pain? None at rest; pain present with activity  NPRS scale: 5/10 Pain location: R volar arm from the shoulder to the wrist  Pain orientation: Right  PAIN TYPE: sharp, tight Pain description: intermittent/with activity Aggravating factors: extending the elbow/reaching into flexion and abd Relieving factors: rest   PRECAUTIONS: Lymphedema precautions: avoid needle sticks/BPs to RUE.  Pt is currently limiting herself to lifting no more than 40# at work.  Pt is currently undergoing chemotherapy p.o.  RED FLAGS: None   WEIGHT BEARING RESTRICTIONS: No  FALLS:  Has patient fallen in last 6 months? 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  OBJECTIVE: Note: Objective measures were completed at Evaluation unless otherwise noted.  COGNITION: Overall cognitive status: Within functional limits for tasks assessed   PALPATION: Mild cording R volar forearm, extending to upper arm and axilla.   OBSERVATIONS / OTHER ASSESSMENTS:  RUE: No visible swelling, but tenderness with palpation at the biceps and volar forearm.  Skin is taught throughout the RUE from the upper arm to the distal forearm; non-pitting.  Mild cording R volar forearm, extending to upper arm and axilla; LUE: non-tender, supple  SENSATION: reports tingling in the ulnar nerve distribution of RUE only with prolonged positioning of elbow in flexion (when playing video games or holding phone).  POSTURE: No significant postural limitations  UPPER EXTREMITY AROM/PROM: (Per PT eval on 08/07/23)      09/18/23: Will remeasure RUE AROM next week to allow an additional week for incision healing  AROM RIGHT   eval  Right 09/22/23  Shoulder extension    Shoulder flexion 135 142  Shoulder abduction 119 149  Shoulder internal rotation Observed WNL WNL  Shoulder external rotation 65 90  Elbow extension -10 0    (Blank rows = not tested)  A/PROM LEFT   eval LEFT 09/22/23  Shoulder extension    Shoulder flexion 150 144  Shoulder abduction 137 180  Shoulder internal rotation Observed WNL WNL  Shoulder external rotation 60 83  Elbow extension 0 0    (Blank rows = not tested)  UPPER EXTREMITY STRENGTH: RUE grossly 4/5, LUE grossly 4+/5 (Per PT eval on 08/07/23)  LYMPHEDEMA ASSESSMENTS:   SURGERY TYPE/DATE: 05/08/2023 bilat mastectomy with reconstruction   NUMBER OF LYMPH NODES REMOVED: SLNB: 2 axillary lymph nodes removed    CHEMOTHERAPY: 16 rounds ending early/mid Oct 2024, now has a chemo pill 2x/day for 14 days, 1 week off for 8 cycles, ending in June  RADIATION: None  HORMONE TREATMENT: None  INFECTIONS: None  LYMPHEDEMA ASSESSMENTS:   UE circumferential measurements in cm  [place]mark R Date: 08/25/23 L Date: 08/25/23 R Date: R Date:  1  MP (distal base of SF) 18.7 17.9    2  Wrist crease 15.5 15.0    3 Forearm (*) +4 cm 15.0 14.5    4 * +4 cm 17.3 16.4    5 * +4 cm 20.0 18.8    6 * +4 cm 21.9 21.5    7 * +4 cm 22.8 22.7    8  Elbow crease 22.2 22.7    9 Upper arm (**) +4 cm 22.0 21.8    10 ** +4 cm 22.2 22.5    11 ** +4 cm 23.7 23.8    12 ** +4 cm 25.4 25.5                      Total circumferential cm 246.7 cm 243.1 cm      Total Limb  differential 3.6 cm  (R 1.5% larger than L; all  points of measure are <2 cm differential throughout RUE/LUE)        Change from initial        TODAY'S TREATMENT:  DATE: 09/22/23 Manual Therapy: -Soft tissue massage proximal>distal to target cording on volar side of RUE. -Reviewed self MLD techniques/routing -To further target RUE cording: Performed MLD to the RUE, working proximal>distal>proximal, routing lymph fluid across chest toward L axilla; gentle pressure across the chest and while clearing R/L axillary nodes and abdomen to avoid any discomfort at surgical incisions beneath B breasts.    Therapeutic Exercise: -Completed AWS exercise series with review of visual/written handout  PATIENT EDUCATION:  Education details: progress towards OT goals (shoulder flexibility) Person educated: Patient Education method: Explanation Education comprehension: verbalized understanding  HOME EXERCISE PROGRAM: AWS exercises  ASSESSMENT: CLINICAL IMPRESSION: Pt continues to report some continued discomfort/tenderness at the R volar elbow  and biceps where cording remains, but improving gradually.  Remeasured shoulder flexibility this date, with all measures having significantly improved bilaterally except a slight reduction on the L shoulder flexion.  However, pt restarted AWS exercises this date for the first time since her breast sx on 09/08/23, so measures are expected to continue to gradually improve.  Pt required pillows beneath upper arms while lying supine on mat table in order to better tolerate AWS exercises.  Pt able to perform all to her tolerance with min vc for form/technique.  Pt will benefit from continued OT to work towards goals in [employer], focusing on reducing cording with therapeutic exercises and manual therapy.  OBJECTIVE IMPAIRMENTS: decreased knowledge of condition, decreased knowledge of use of DME, decreased ROM, decreased strength, increased edema, increased fascial restrictions, impaired flexibility, impaired sensation, impaired UE functional use, and pain.   ACTIVITY LIMITATIONS: carrying, lifting, dressing, reach over head, and caring for others  PARTICIPATION LIMITATIONS: occupation  PERSONAL FACTORS: 1-2 comorbidities: bilat mastectomy, reconstruction  are also affecting patient's functional outcome.   REHAB POTENTIAL: Excellent  CLINICAL DECISION MAKING: Evolving/moderate complexity  EVALUATION COMPLEXITY: Moderate  GOALS: Goals reviewed with patient? Yes  SHORT TERM GOALS: Target date: 09/15/23   1.  Pt will be indep to verbalize 2-3 lymphedema precautions to prevent infection and lymphedema flare ups. Baseline: Eval: Initiated educ on optimal skin care; further training needed; 09/17/33: indep with precautions Goal status: achieved  2.  Pt will be indep to verbalize 1-2 signs of lymphedema flare ups and identify subsequent action steps. Baseline: Eval: Educ not yet initiated; 09/18/23: indep with s/s of flare ups and action steps Goal status: achieved  LONG TERM GOALS: Target date:  10/30/23  Pt will be indep to perform HEP for reducing R AWS and promoting RUE lymphatic drainage with use of visual handouts. Baseline: Eval: HEP not yet initiated; 09/18/23: HEP has been on hold over the last 2 weeks since pt's sx; pt has been given the ok to restart exercises this week to her tolerance; will assess independence with HEP next week Goal status: ongoing  2.  Pt will demonstrate self MLD technique with 75% accuracy using visual handouts as needed to promote lymphatic drainage throughout the RUE. Baseline: Eval: Instruction not yet initiated; 09/18/23: Limited BUE ROM post breast implant sx last week, so self MLD not yet attempted.  Pt requires min-mod vc for sequencing when reviewing verbally Goal status: ongoing  3.  Pt will demonstrate soft tissue massage technique with 75% accuracy using visual/written handouts as needed to reduce/release R AWS in the  RUE. Baseline: Eval: Not yet initiated; 09/18/23: indep with technique but currently limited with ability to perform self massage d/t limited ROM post breast sx last week, and family not consistently available to perform.  Goal status: ongoing  4.  Pt will demonstrate independence with donning/doffing class 1 compression sleeve and gauntlet to the RUE. Baseline: Eval: Not yet obtained; 09/18/23: Pt planning to pick up her sleeve today Goal status: ongoing  5.  Pt will demonstrate 75% or better adherence to recommended wearing schedule for compression sleeve/glove. Baseline: Eval: Not yet obtained/wearing schedule not yet reviewed; 09/18/23: Defer; Pt will be picking up sleeve today Goal status: ongoing   PLAN:  OT FREQUENCY: 2x/week  OT DURATION: 6 weeks  PLANNED INTERVENTIONS: 62952- OT Re-Evaluation, 97110-Therapeutic exercises, 97530- Therapeutic activity, 97535- Self Care, 84132- Manual therapy, 97760- Orthotic Fit/training, Patient/Family education, Joint mobilization, Manual lymph drainage, Scar mobilization, Compression  bandaging, DME instructions, Cryotherapy, and Moist heat  PLAN FOR NEXT SESSION: manual therapy, review AWS HEP   Danelle Earthly, MS, OTR/L, CLT  Otis Dials, OT 09/22/2023, 3:58 PM

## 2023-09-23 ENCOUNTER — Ambulatory Visit: Payer: Medicaid Other | Admitting: Physical Therapy

## 2023-09-24 ENCOUNTER — Ambulatory Visit: Payer: Medicaid Other

## 2023-09-25 ENCOUNTER — Ambulatory Visit: Attending: Surgical

## 2023-09-25 DIAGNOSIS — Z9013 Acquired absence of bilateral breasts and nipples: Secondary | ICD-10-CM | POA: Diagnosis present

## 2023-09-25 DIAGNOSIS — L7682 Other postprocedural complications of skin and subcutaneous tissue: Secondary | ICD-10-CM | POA: Diagnosis present

## 2023-09-25 DIAGNOSIS — M6281 Muscle weakness (generalized): Secondary | ICD-10-CM | POA: Diagnosis present

## 2023-09-25 DIAGNOSIS — L905 Scar conditions and fibrosis of skin: Secondary | ICD-10-CM | POA: Diagnosis present

## 2023-09-25 DIAGNOSIS — I890 Lymphedema, not elsewhere classified: Secondary | ICD-10-CM | POA: Insufficient documentation

## 2023-09-26 ENCOUNTER — Ambulatory Visit: Payer: Medicaid Other | Admitting: Physical Therapy

## 2023-09-28 NOTE — Therapy (Signed)
 OUTPATIENT OCCUPATIONAL THERAPY LYMPHEDEMA TREATMENT NOTE  Patient Name: Eileen Hart MRN: [id number] DOB:12-17-1990, 33 y.o., female Today's Date: 09/28/2023  END OF SESSION:   OT End of Session - 09/28/23 1951     Visit Number 8    Number of Visits 18    Date for OT Re-Evaluation 10/30/23    Authorization Type  [employer]ICA[employer]PAID [employer] PLAN    Progress Note Due on Visit 10    OT Start Time 1147    OT Stop Time 1230    OT Time Calculation (min) 43 min    Equipment Utilized During Treatment None    Activity Tolerance Patient tolerated treatment well    Behavior During Therapy WFL for tasks assessed/performed            Past Medical History:  Diagnosis Date   Chlamydia 01/25/2010   Depression with anxiety    Gestational hypertension    resolved after delivery   Immunization, viral disease    gardasil series completed   Invasive carcinoma of breast (HCC)    right   Tobacco abuse 05/02/2016   Past Surgical History:  Procedure Laterality Date   AUGMENTATION MAMMAPLASTY Bilateral    saline implants 2015   BREAST BIOPSY Right 10/14/2022   US biopsy/ coil clip/ path pending   BREAST BIOPSY Right 10/14/2022   [place] RT BREAST BX W LOC DEV 1ST LESION IMG BX SPEC US GUIDE 10/14/2022 [facility]   BREAST BIOPSY Left 10/29/2022   [place] Core Bx Ribbon clip- path pending   BREAST BIOPSY Right 10/29/2022   [place] Core 1130 Venus Clip- path pending   BREAST BIOPSY Right 10/29/2022   [place] Core Bx Retroareolar heart clip-path pending   BREAST BIOPSY Right 10/29/2022   [place] Core Bx Ribbon Clip path pending   BREAST BIOPSY Right 10/29/2022   [place] RT BREAST BX W LOC DEV EA ADD LESION IMG BX SPEC US GUIDE 10/29/2022 [facility]   BREAST BIOPSY Right 10/29/2022   [place] RT BREAST BX W LOC DEV 1ST LESION IMG BX SPEC US GUIDE 10/29/2022 [facility]   BREAST BIOPSY Right 10/29/2022   [place] RT BREAST BX W LOC DEV EA ADD LESION IMG BX SPEC US GUIDE 10/29/2022 [facility]   BREAST BIOPSY Left  10/29/2022   [place] LT BREAST BX W LOC DEV 1ST LESION IMG BX SPEC US GUIDE 10/29/2022 [facility]   BREAST ENHANCEMENT SURGERY  2015   BREAST RECONSTRUCTION WITH PLACEMENT OF TISSUE EXPANDER AND FLEX HD (ACELLULAR HYDRATED DERMIS) Bilateral 05/08/2023   Procedure: Bilateral immediate breast reconstruction with expanders and Flex HD placement;  Surgeon: Peggye Form, DO;  Location: [facility];  Service: Plastics;  Laterality: Bilateral;   CESAREAN SECTION  2014   MASTECTOMY W/ SENTINEL NODE BIOPSY Bilateral 05/08/2023   Procedure: MASTECTOMY Simple WITH SENTINEL LYMPH NODE BIOPSY, RNFA to assist;  Surgeon: Leafy Ro, MD;  Location: [facility];  Service: General;  Laterality: Bilateral;   PORT-A-CATH REMOVAL  04/2023   PORTACATH PLACEMENT N/A 10/24/2022   PortA Cath removed; Procedure: INSERTION PORT-A-CATH;  Surgeon: Leafy Ro, MD;  Location: [facility];  Service: General;  Laterality: N/A;   REMOVAL OF BILATERAL TISSUE EXPANDERS WITH PLACEMENT OF BILATERAL BREAST IMPLANTS Bilateral 09/08/2023   Procedure: REMOVAL, TISSUE EXPANDER, BREAST, BILATERAL, WITH BILATERAL IMPLANT IMPLANT INSERTION;  Surgeon: Peggye Form, DO;  Location: [facility];  Service: Plastics;  Laterality: Bilateral;   TONSILLECTOMY     Patient Active Problem List   Diagnosis Date Noted  S/P bilateral mastectomy 05/08/2023   Genetic testing 10/30/2022   Invasive carcinoma of breast (HCC) 10/22/2022   Malignant neoplasm of upper-outer quadrant of right breast in female, estrogen receptor negative (HCC) 10/22/2022   Lump of right breast 09/18/2022   Weight gain 12/04/2020   Right wrist pain 09/27/2019   Contraceptive management 07/07/2019   Anxiety and depression 12/29/2015   PCP: Dr. Birdie Sons (Pt reports this MD just left the practice, but she will continue with this practice with another MD)  REFERRING PROVIDER: Keenan Bachelor, J, PA  REFERRING DIAG:  [phone] (ICD-10-CM) - Malignant neoplasm of  upper-outer quadrant of right female breast  Z17.1 (ICD-10-CM) - Estrogen receptor negative status (ER-)  C50.919 (ICD-10-CM) - Malignant neoplasm of unspecified site of unspecified female breast  Z98.890 (ICD-10-CM) - Other specified postprocedural states   THERAPY DIAG:  Axillary web syndrome  S/P bilateral mastectomy  Lymphedema of right arm  Rationale for Evaluation and Treatment: Rehabilitation  ONSET DATE: Feb 1st 2025  SUBJECTIVE:                                                                                                                                                                                          SUBJECTIVE STATEMENT: Pt reports that her surgical incisions continue to heal well beneath bilat breasts. Accompanied by: self  PERTINENT HISTORY: Per MEDICAL RECORD NUMBER Pt diagnosed with malignant neoplasm of upper-outer quadrant of right breast in female, estrogen receptor negative (HCC) 10/22/2022.  Pt is s/p bilat mastectomy and reconstruction with expanders in place 05/08/2023. Currently receiving p.o. chemo therapy with Xeloda, which she will finish early June.  PAIN: 09/25/23: 2-3/10 R volar elbow, biceps Are you having pain? None at rest; pain present with activity  NPRS scale: 5/10 Pain location: R volar arm from the shoulder to the wrist  Pain orientation: Right  PAIN TYPE: sharp, tight Pain description: intermittent/with activity Aggravating factors: extending the elbow/reaching into flexion and abd Relieving factors: rest   PRECAUTIONS: Lymphedema precautions: avoid needle sticks/BPs to RUE.  Pt is currently limiting herself to lifting no more than 40# at work.  Pt is currently undergoing chemotherapy p.o.  RED FLAGS: None   WEIGHT BEARING RESTRICTIONS: No  FALLS:  Has patient fallen in last 6 months? 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  OBJECTIVE: Note: Objective measures were completed at Evaluation unless otherwise noted.  COGNITION: Overall cognitive status: Within functional limits for tasks assessed   PALPATION: Mild cording R volar forearm, extending to upper arm and axilla.   OBSERVATIONS / OTHER ASSESSMENTS:  RUE: No visible swelling, but tenderness with palpation at the biceps and volar forearm.  Skin is taught throughout the RUE from the upper arm to the distal forearm; non-pitting.  Mild cording R volar forearm, extending to upper arm and axilla; LUE: non-tender, supple  SENSATION: reports tingling in the ulnar nerve distribution of RUE only with prolonged positioning of elbow in flexion (when playing video games or holding phone).  POSTURE: No significant postural limitations  UPPER EXTREMITY AROM/PROM: (Per PT eval on 08/07/23)      09/18/23: Will remeasure RUE AROM next week to allow an additional week for incision healing  AROM RIGHT   eval  Right 09/22/23  Shoulder extension    Shoulder flexion 135 142  Shoulder abduction 119 149  Shoulder internal rotation Observed WNL WNL  Shoulder external rotation 65 90  Elbow extension -10 0    (Blank rows = not tested)  A/PROM LEFT   eval LEFT 09/22/23  Shoulder extension    Shoulder flexion 150 144  Shoulder abduction 137 180  Shoulder internal rotation Observed WNL WNL  Shoulder external rotation 60 83  Elbow extension 0 0    (Blank rows = not tested)  UPPER EXTREMITY STRENGTH: RUE grossly 4/5, LUE grossly 4+/5 (Per PT eval on 08/07/23)  LYMPHEDEMA ASSESSMENTS:   SURGERY TYPE/DATE: 05/08/2023 bilat mastectomy with reconstruction   NUMBER OF LYMPH NODES REMOVED: SLNB: 2 axillary lymph  nodes removed   CHEMOTHERAPY: 16 rounds ending early/mid Oct 2024, now has a chemo pill 2x/day for 14 days, 1 week off for 8 cycles, ending in June  RADIATION: None  HORMONE TREATMENT: None  INFECTIONS: None  LYMPHEDEMA ASSESSMENTS:   UE circumferential measurements in cm  [place]mark R Date: 08/25/23 L Date: 08/25/23 R Date: R Date:  1  MP (distal base of SF) 18.7 17.9    2  Wrist crease 15.5 15.0    3 Forearm (*) +4 cm 15.0 14.5    4 * +4 cm 17.3 16.4    5 * +4 cm 20.0 18.8    6 * +4 cm 21.9 21.5    7 * +4 cm 22.8 22.7    8  Elbow crease 22.2 22.7    9 Upper arm (**) +4 cm 22.0 21.8    10 ** +4 cm 22.2 22.5    11 ** +4 cm 23.7 23.8    12 ** +4 cm 25.4 25.5                      Total circumferential cm 246.7 cm 243.1 cm      Total Limb  differential 3.6 cm  (R 1.5% larger than L; all  points of measure are <2 cm differential throughout RUE/LUE)        Change from initial        TODAY'S TREATMENT:  DATE: 09/25/23 Manual Therapy: -Soft tissue massage proximal>distal to target cording on volar side of RUE. -Reviewed self MLD techniques/routing -To further target RUE cording: Performed MLD to the RUE, working proximal>distal>proximal, routing lymph fluid across chest toward L axilla; gentle pressure across the chest and while clearing R/L axillary nodes and abdomen to avoid any discomfort at surgical incisions beneath B breasts.    Therapeutic Exercise: -"T" stretch-hold x3 min (R arm only) -"W" stretch-hold x3 min (R arm only) -AAROM for R shoulder abd/flex, elbow flex/ext  PATIENT EDUCATION:  Education details: review of self MLD techniques Person educated: Patient Education method: Explanation Education comprehension: verbalized understanding  HOME EXERCISE PROGRAM: AWS exercises  ASSESSMENT: CLINICAL IMPRESSION: Pt continues to report some  continued discomfort/tenderness at the R volar elbow and biceps where cording remains, but improving gradually.  R shoulder flexibility continues to improve, with pt reporting improved ability to reach behind head without discomfort.  Pt will benefit from continued OT to work towards goals in [employer], focusing on reducing cording with therapeutic exercises and manual therapy.  OBJECTIVE IMPAIRMENTS: decreased knowledge of condition, decreased knowledge of use of DME, decreased ROM, decreased strength, increased edema, increased fascial restrictions, impaired flexibility, impaired sensation, impaired UE functional use, and pain.   ACTIVITY LIMITATIONS: carrying, lifting, dressing, reach over head, and caring for others  PARTICIPATION LIMITATIONS: occupation  PERSONAL FACTORS: 1-2 comorbidities: bilat mastectomy, reconstruction  are also affecting patient's functional outcome.   REHAB POTENTIAL: Excellent  CLINICAL DECISION MAKING: Evolving/moderate complexity  EVALUATION COMPLEXITY: Moderate  GOALS: Goals reviewed with patient? Yes  SHORT TERM GOALS: Target date: 09/15/23   1.  Pt will be indep to verbalize 2-3 lymphedema precautions to prevent infection and lymphedema flare ups. Baseline: Eval: Initiated educ on optimal skin care; further training needed; 09/17/33: indep with precautions Goal status: achieved  2.  Pt will be indep to verbalize 1-2 signs of lymphedema flare ups and identify subsequent action steps. Baseline: Eval: Educ not yet initiated; 09/18/23: indep with s/s of flare ups and action steps Goal status: achieved  LONG TERM GOALS: Target date: 10/30/23  Pt will be indep to perform HEP for reducing R AWS and promoting RUE lymphatic drainage with use of visual handouts. Baseline: Eval: HEP not yet initiated; 09/18/23: HEP has been on hold over the last 2 weeks since pt's sx; pt has been given the ok to restart exercises this week to her tolerance; will assess independence with  HEP next week Goal status: ongoing  2.  Pt will demonstrate self MLD technique with 75% accuracy using visual handouts as needed to promote lymphatic drainage throughout the RUE. Baseline: Eval: Instruction not yet initiated; 09/18/23: Limited BUE ROM post breast implant sx last week, so self MLD not yet attempted.  Pt requires min-mod vc for sequencing when reviewing verbally Goal status: ongoing  3.  Pt will demonstrate soft tissue massage technique with 75% accuracy using visual/written handouts as needed to reduce/release R AWS in the  RUE. Baseline: Eval: Not yet initiated; 09/18/23: indep with technique but currently limited with ability to perform self massage d/t limited ROM post breast sx last week, and family not consistently available to perform.   Goal status: ongoing  4.  Pt will demonstrate independence with donning/doffing class 1 compression sleeve and gauntlet to the RUE. Baseline: Eval: Not yet obtained; 09/18/23: Pt planning to pick up her sleeve today Goal status: ongoing  5.  Pt will demonstrate 75% or better adherence to recommended wearing schedule  for compression sleeve/glove. Baseline: Eval: Not yet obtained/wearing schedule not yet reviewed; 09/18/23: Defer; Pt will be picking up sleeve today Goal status: ongoing   PLAN:  OT FREQUENCY: 2x/week  OT DURATION: 6 weeks  PLANNED INTERVENTIONS: 62952- OT Re-Evaluation, 97110-Therapeutic exercises, 97530- Therapeutic activity, 97535- Self Care, [id number]- Manual therapy, 97760- Orthotic Fit/training, Patient/Family education, Joint mobilization, Manual lymph drainage, Scar mobilization, Compression bandaging, DME instructions, Cryotherapy, and Moist heat  PLAN FOR NEXT SESSION: manual therapy, review AWS HEP   Danelle Earthly, MS, OTR/L, CLT  Otis Dials, OT 09/28/2023, 7:53 PM

## 2023-09-29 ENCOUNTER — Inpatient Hospital Stay: Payer: Medicaid Other | Attending: Oncology

## 2023-09-29 ENCOUNTER — Telehealth: Payer: Self-pay

## 2023-09-29 ENCOUNTER — Ambulatory Visit: Payer: Medicaid Other

## 2023-09-29 ENCOUNTER — Inpatient Hospital Stay (HOSPITAL_BASED_OUTPATIENT_CLINIC_OR_DEPARTMENT_OTHER): Payer: Medicaid Other | Admitting: Oncology

## 2023-09-29 ENCOUNTER — Encounter: Payer: Self-pay | Admitting: Oncology

## 2023-09-29 VITALS — BP 113/84 | HR 86 | Temp 97.7°F | Ht 63.0 in | Wt 113.9 lb

## 2023-09-29 DIAGNOSIS — L7682 Other postprocedural complications of skin and subcutaneous tissue: Secondary | ICD-10-CM | POA: Diagnosis not present

## 2023-09-29 DIAGNOSIS — D229 Melanocytic nevi, unspecified: Secondary | ICD-10-CM | POA: Diagnosis not present

## 2023-09-29 DIAGNOSIS — Z79899 Other long term (current) drug therapy: Secondary | ICD-10-CM

## 2023-09-29 DIAGNOSIS — D702 Other drug-induced agranulocytosis: Secondary | ICD-10-CM | POA: Diagnosis not present

## 2023-09-29 DIAGNOSIS — D701 Agranulocytosis secondary to cancer chemotherapy: Secondary | ICD-10-CM | POA: Insufficient documentation

## 2023-09-29 DIAGNOSIS — Z171 Estrogen receptor negative status [ER-]: Secondary | ICD-10-CM

## 2023-09-29 DIAGNOSIS — C50411 Malignant neoplasm of upper-outer quadrant of right female breast: Secondary | ICD-10-CM | POA: Diagnosis present

## 2023-09-29 DIAGNOSIS — Z87891 Personal history of nicotine dependence: Secondary | ICD-10-CM | POA: Diagnosis not present

## 2023-09-29 DIAGNOSIS — T451X5A Adverse effect of antineoplastic and immunosuppressive drugs, initial encounter: Secondary | ICD-10-CM | POA: Insufficient documentation

## 2023-09-29 DIAGNOSIS — I890 Lymphedema, not elsewhere classified: Secondary | ICD-10-CM

## 2023-09-29 DIAGNOSIS — Z803 Family history of malignant neoplasm of breast: Secondary | ICD-10-CM | POA: Insufficient documentation

## 2023-09-29 DIAGNOSIS — Z9013 Acquired absence of bilateral breasts and nipples: Secondary | ICD-10-CM

## 2023-09-29 LAB — CMP (CANCER CENTER ONLY)
ALT: 11 U/L (ref 0–44)
AST: 16 U/L (ref 15–41)
Albumin: 4.3 g/dL (ref 3.5–5.0)
Alkaline Phosphatase: 59 U/L (ref 38–126)
Anion gap: 4 — ABNORMAL LOW (ref 5–15)
BUN: 8 mg/dL (ref 6–20)
CO2: 24 mmol/L (ref 22–32)
Calcium: 9 mg/dL (ref 8.9–10.3)
Chloride: 104 mmol/L (ref 98–111)
Creatinine: 0.78 mg/dL (ref 0.44–1.00)
GFR, Estimated: >60 mL/min (ref >60)
Glucose, Bld: 104 mg/dL — ABNORMAL HIGH (ref 70–99)
Potassium: 3.8 mmol/L (ref 3.5–5.1)
Sodium: 132 mmol/L — ABNORMAL LOW (ref 135–145)
Total Bilirubin: 0.5 mg/dL (ref 0.0–1.2)
Total Protein: 7.3 g/dL (ref 6.5–8.1)

## 2023-09-29 LAB — CBC WITH DIFFERENTIAL (CANCER CENTER ONLY)
Abs Immature Granulocytes: 0.04 10*3/uL (ref 0.00–0.07)
Basophils Absolute: 0 10*3/uL (ref 0.0–0.1)
Basophils Relative: 1 %
Eosinophils Absolute: 0 10*3/uL (ref 0.0–0.5)
Eosinophils Relative: 1 %
HCT: 39.3 % (ref 36.0–46.0)
Hemoglobin: 13.3 g/dL (ref 12.0–15.0)
Immature Granulocytes: 1 %
Lymphocytes Relative: 49 %
Lymphs Abs: 2 10*3/uL (ref 0.7–4.0)
MCH: 32 pg (ref 26.0–34.0)
MCHC: 33.8 g/dL (ref 30.0–36.0)
MCV: 94.5 fL (ref 80.0–100.0)
Monocytes Absolute: 0.3 10*3/uL (ref 0.1–1.0)
Monocytes Relative: 7 %
Neutro Abs: 1.6 10*3/uL — ABNORMAL LOW (ref 1.7–7.7)
Neutrophils Relative %: 41 %
Platelet Count: 213 10*3/uL (ref 150–400)
RBC: 4.16 MIL/uL (ref 3.87–5.11)
RDW: 14.1 % (ref 11.5–15.5)
WBC Count: 4 10*3/uL (ref 4.0–10.5)
nRBC: 0 % (ref 0.0–0.2)

## 2023-09-29 NOTE — Progress Notes (Signed)
 Hematology/Oncology Consult note [facility]  [phone] [phone]  Patient Care Team: Pcp, No as PCP - General Hulen Luster, RN as Oncology Nurse Navigator Creig Hines, MD as Consulting Physician (Oncology) Leafy Ro, MD as Consulting Physician (General Surgery) Dillingham, Alena Bills, DO as Attending Physician (Plastic Surgery)   Name of the patient: Eileen Hart  [id number]  1990-11-02   Date of visit: 09/29/23  Diagnosis-  clinical prognostic stage Ib invasive mammary carcinoma of the right breast cT1 cN0 M0 triple negative   Chief complaint/ Reason for visit-on treatment assessment prior to cycle 5 of adjuvant Xeloda  Heme/Onc history:  Patient is a 33 year old female who underwent a bilateral diagnostic mammogram on 10/07/2022 after she felt a palpable area of concern in her right breast. Mammogram showed a 1.6 x 1 x 1.5 cm solid mass in the right breast 9:30 position 5 cm from the nipple. There was also an adjacent 6 x 9 x 2 mm mass in the right breast. No right axillary adenopathy. The dominant mass was biopsied and was consistent with invasive mammary carcinoma grade 3 ER and HER2 negative.    Bilateral breast MRI showed additional areas of concern in the right breast.  2 additional enhancing masses 1 in the 12 o'clock position and 1 in the 4 o'clock position.  2 adjacent prominent right axillary lymph nodes.  2 intermittent masses in the left breast 9 o'clock position and upper outer left breast.  This was followed by a dedicated ultrasound.  She had 3 breast biopsies in the right side and 1 left breast biopsy and all of them were negative for malignancy.  On the ultrasound her right axillary lymph nodes appeared normal and therefore biopsy was not recommended for the same.   PET CT scan showed focal hypermetabolic activity in the right breast adjacent to the biopsy clip but no other additional areas of concern.  Baseline MUGA  scan showed normal EF.   Given that additional biopsies were negative for malignancy and the only biopsy-proven site was 1.6 cm triple negative breast cancer with negative lymph nodes plan was to do [facility] Taxol chemotherapy neoadjuvant without opting for keynote 522 regimen.    Mild decrease in the size of primary breast mass after 4 cycles of AC chemotherapy. Carboplatin added to taxol neoadjuvantly   Patient underwent bilateral mastectomy with reconstruction on 05/08/2023.No malignancy noted in the left breast. She was found to have 0.6 cm invasive ductal carcinoma high-grade in the right breast. There was another area of fibromatoid change noted adjacent to it and the intervening parenchyma between these 2 areas showed an additional 0.4 cm invasive ductal carcinoma 2 sentinel lymph nodes negative for malignancy. No definitive response to presurgical therapy in the invasive carcinoma. Overall cancer cellularity 95%. ypT1b N0.       Interval history-patient is completing cycle 4 of adjuvant Xeloda in 2 days.  She did report improvement in her energy levels when she was on Xeloda for her bilateral breast implants  ECOG PS- 0 Pain scale- 0   Review of systems- Review of Systems  Constitutional:  Positive for malaise/fatigue. Negative for chills, fever and weight loss.  HENT:  Negative for congestion, ear discharge and nosebleeds.   Eyes:  Negative for blurred vision.  Respiratory:  Negative for cough, hemoptysis, sputum production, shortness of breath and wheezing.   Cardiovascular:  Negative for chest pain, palpitations, orthopnea and claudication.  Gastrointestinal:  Negative for  abdominal pain, blood in stool, constipation, diarrhea, heartburn, melena, nausea and vomiting.  Genitourinary:  Negative for dysuria, flank pain, frequency, hematuria and urgency.  Musculoskeletal:  Negative for back pain, joint pain and myalgias.  Skin:  Negative for rash.  Neurological:  Negative for dizziness,  tingling, focal weakness, seizures, weakness and headaches.  Endo/Heme/Allergies:  Does not bruise/bleed easily.  Psychiatric/Behavioral:  Negative for depression and suicidal ideas. The patient does not have insomnia.       No Known Allergies   Past Medical History:  Diagnosis Date   Chlamydia 01/25/2010   Depression with anxiety    Gestational hypertension    resolved after delivery   Immunization, viral disease    gardasil series completed   Invasive carcinoma of breast (HCC)    right   Tobacco abuse 05/02/2016     Past Surgical History:  Procedure Laterality Date   AUGMENTATION MAMMAPLASTY Bilateral    saline implants 2015   BREAST BIOPSY Right 10/14/2022   US biopsy/ coil clip/ path pending   BREAST BIOPSY Right 10/14/2022   [place] RT BREAST BX W LOC DEV 1ST LESION IMG BX SPEC US GUIDE 10/14/2022 [facility]   BREAST BIOPSY Left 10/29/2022   [place] Core Bx Ribbon clip- path pending   BREAST BIOPSY Right 10/29/2022   [place] Core 1130 Venus Clip- path pending   BREAST BIOPSY Right 10/29/2022   [place] Core Bx Retroareolar heart clip-path pending   BREAST BIOPSY Right 10/29/2022   [place] Core Bx Ribbon Clip path pending   BREAST BIOPSY Right 10/29/2022   [place] RT BREAST BX W LOC DEV EA ADD LESION IMG BX SPEC US GUIDE 10/29/2022 [facility]   BREAST BIOPSY Right 10/29/2022   [place] RT BREAST BX W LOC DEV 1ST LESION IMG BX SPEC US GUIDE 10/29/2022 [facility]   BREAST BIOPSY Right 10/29/2022   [place] RT BREAST BX W LOC DEV EA ADD LESION IMG BX SPEC US GUIDE 10/29/2022 [facility]   BREAST BIOPSY Left 10/29/2022   [place] LT BREAST BX W LOC DEV 1ST LESION IMG BX SPEC US GUIDE 10/29/2022 [facility]   BREAST ENHANCEMENT SURGERY  2015   BREAST RECONSTRUCTION WITH PLACEMENT OF TISSUE EXPANDER AND FLEX HD (ACELLULAR HYDRATED DERMIS) Bilateral 05/08/2023   Procedure: Bilateral immediate breast reconstruction with expanders and Flex HD placement;  Surgeon: Peggye Form, DO;  Location:  [facility];  Service: Plastics;  Laterality: Bilateral;   CESAREAN SECTION  2014   MASTECTOMY W/ SENTINEL NODE BIOPSY Bilateral 05/08/2023   Procedure: MASTECTOMY Simple WITH SENTINEL LYMPH NODE BIOPSY, RNFA to assist;  Surgeon: Leafy Ro, MD;  Location: [facility];  Service: General;  Laterality: Bilateral;   PORT-A-CATH REMOVAL  04/2023   PORTACATH PLACEMENT N/A 10/24/2022   PortA Cath removed; Procedure: INSERTION PORT-A-CATH;  Surgeon: Leafy Ro, MD;  Location: [facility];  Service: General;  Laterality: N/A;   REMOVAL OF BILATERAL TISSUE EXPANDERS WITH PLACEMENT OF BILATERAL BREAST IMPLANTS Bilateral 09/08/2023   Procedure: REMOVAL, TISSUE EXPANDER, BREAST, BILATERAL, WITH BILATERAL IMPLANT IMPLANT INSERTION;  Surgeon: Peggye Form, DO;  Location: [facility];  Service: Plastics;  Laterality: Bilateral;   TONSILLECTOMY      Social History   Socioeconomic History   Marital status: Single    Spouse name: Not on file   Number of children: 1   Years of education: 15   Highest education level: Not on file  Occupational History   Occupation: Groomer    Comment: [employer]  Tobacco Use  Smoking status: Former    Current packs/day: 0.00    Types: Cigarettes    Quit date: 07/25/2018    Years since quitting: 5.1    Passive exposure: Past   Smokeless tobacco: Never   Tobacco comments:    quit 07/2018  Vaping Use   Vaping status: Former   Quit date: 07/25/2022  Substance and Sexual Activity   Alcohol use: No    Alcohol/week: 0.0 standard drinks of alcohol   Drug use: No   Sexual activity: Yes    Birth control/protection: None  Other Topics Concern   Not on file  Social History Narrative   Not on file   Social Drivers of Health   Financial Resource Strain: Not on file  Food Insecurity: No Food Insecurity (05/08/2023)   Hunger Vital Sign    Worried About Running Out of Food in the Last Year: Never true    Ran Out of Food in the Last Year: Never true   Transportation Needs: No Transportation Needs (05/08/2023)   PRAPARE - Administrator, Civil Service (Medical): No    Lack of Transportation (Non-Medical): No  Physical Activity: Not on file  Stress: Not on file  Social Connections: Not on file  Intimate Partner Violence: Not At Risk (05/08/2023)   Humiliation, Afraid, Rape, and Kick questionnaire    Fear of Current or Ex-Partner: No    Emotionally Abused: No    Physically Abused: No    Sexually Abused: No    Family History  Problem Relation Age of Onset   Breast cancer Other 35       mat 2nd cousin   Diabetes Neg Hx    Heart disease Neg Hx    Hypertension Neg Hx    Ovarian cancer Neg Hx    Colon cancer Neg Hx      Current Outpatient Medications:    acetaminophen (TYLENOL) 500 MG tablet, Take 1,000 mg by mouth every 6 (six) hours as needed for moderate pain (pain score 4-6)., Disp: , Rfl:    capecitabine (XELODA) 500 MG tablet, Take 3 tablets (1,500 mg total) by mouth 2 (two) times daily after a meal. Take for 14 days, then hold for 7 days. Repeat every 21 days., Disp: 84 tablet, Rfl: 1   diclofenac Sodium (VOLTAREN) 1 % GEL, Research Patient: Apply 0.5 grams (1 fingertip) to each hand and each foot twice daily for up to 12 weeks, Disp: 400 g, Rfl: 0   gabapentin (NEURONTIN) 300 MG capsule, Take 1 capsule (300 mg total) by mouth 3 (three) times daily., Disp: 90 capsule, Rfl: 3   ibuprofen (ADVIL) 200 MG tablet, Take 600-800 mg by mouth every 6 (six) hours as needed for moderate pain (pain score 4-6)., Disp: , Rfl:    ondansetron (ZOFRAN) 4 MG tablet, Take 1 tablet (4 mg total) by mouth every 8 (eight) hours as needed for nausea or vomiting., Disp: 60 tablet, Rfl: 2   ondansetron (ZOFRAN) 4 MG tablet, Take 1 tablet (4 mg total) by mouth every 8 (eight) hours as needed for nausea or vomiting., Disp: 20 tablet, Rfl: 0   diazepam (VALIUM) 2 MG tablet, Take 1 tablet (2 mg total) by mouth every 12 (twelve) hours as needed  for muscle spasms. (Patient not taking: Reported on 09/29/2023), Disp: 5 tablet, Rfl: 0   oxyCODONE (OXY IR/ROXICODONE) 5 MG immediate release tablet, Take 1 tablet (5 mg total) by mouth every 6 (six) hours as needed for up to 10  doses for severe pain (pain score 7-10). (Patient not taking: Reported on 09/29/2023), Disp: 10 tablet, Rfl: 0  Physical exam:  Vitals:   09/29/23 1348  BP: 113/84  Pulse: 86  Temp: 97.7 F (36.5 C)  SpO2: 100%  Weight: 113 lb 14.4 oz (51.7 kg)  Height: 5\' 3"  (1.6 m)   Physical Exam Cardiovascular:     Rate and Rhythm: Normal rate and regular rhythm.     Heart sounds: Normal heart sounds.  Pulmonary:     Effort: Pulmonary effort is normal.     Breath sounds: Normal breath sounds.  Abdominal:     Palpations: Abdomen is soft.  Skin:    General: Skin is warm and dry.  Neurological:     Mental Status: She is alert and oriented to person, place, and time.      I have personally reviewed labs listed below:    Latest Ref Rng & Units 08/18/2023   10:32 AM  CMP  Glucose 70 - 99 mg/dL 98   BUN 6 - 20 mg/dL 11   Creatinine 6.21 - 1.00 mg/dL 3.08   Sodium 657 - 846 mmol/L 137   Potassium 3.5 - 5.1 mmol/L 4.6   Chloride 98 - 111 mmol/L 102   CO2 22 - 32 mmol/L 21   Calcium 8.9 - 10.3 mg/dL 96.2   Total Protein 6.5 - 8.1 g/dL 8.5   Total Bilirubin 0.0 - 1.2 mg/dL 1.3   Alkaline Phos 38 - 126 U/L 62   AST 15 - 41 U/L 20   ALT 0 - 44 U/L 12       Latest Ref Rng & Units 09/29/2023    1:42 PM  CBC  WBC 4.0 - 10.5 K/uL 4.0   Hemoglobin 12.0 - 15.0 g/dL 95.2   Hematocrit 84.1 - 46.0 % 39.3   Platelets 150 - 400 K/uL 213       Assessment and plan- Patient is a 33 y.o. female with history of stage Ib invasive mammary carcinoma of the right breast cT1 cN0 M0 triple negative status post neoadjuvant AC CarboTaxol chemotherapy. She had 2 foci of residual disease 0.6 and 0.4 cm after neoadjuvant chemotherapy. She is here for on treatment assessment prior to cycle  5 of adjuvant xeloda  Patient will be completing cycle 4 of adjuvant Xeloda in 3 days time.  She will be starting cycle 5 tentatively on Nov 06, 2023.  Will bring her for CBC with differential and CMP sometime around 11/03/2023.  I will see her back in 6 weeks prior to start of cycle 6 of adjuvant Xeloda.Drug induced neutropenia stable. Continue to monitor  Patient has punctate black moles throughout her body which she feels have increased throughout her body which she feels have increased since her diagnosis of breast cancer and going through chemotherapy and she would like to see dermatology for the same.  I will make referral.   Visit Diagnosis 1. High risk medication use   2. Malignant neoplasm of upper-outer quadrant of right breast in female, estrogen receptor negative (HCC)   3. Numerous skin moles   4. Drug-induced neutropenia (HCC)      Dr. Owens Shark, MD, MPH [facility] [phone] 09/29/2023 1:54 PM

## 2023-09-29 NOTE — Telephone Encounter (Signed)
 Spoke with pt after collaboration with Oletta Cohn, OT/CLT who completes SOZO screen at [facility]. Pt will be called to have SOZO screen next Wednesday with Marisue Humble. Until then, pt to begin wearing her compression sleeve 6-8 hours a day to manage cording in RUE for the next 6-8 weeks.  Pt in agreement with plan.  Danelle Earthly, MS, OTR/L, CLT

## 2023-09-29 NOTE — Therapy (Signed)
 OUTPATIENT OCCUPATIONAL THERAPY LYMPHEDEMA TREATMENT NOTE  Patient Name: Eileen Hart MRN: [id number] DOB:1991/01/22, 33 y.o., female Today's Date: 09/29/2023  END OF SESSION:   OT End of Session - 09/29/23 1023     Visit Number 9    Number of Visits 18    Date for OT Re-Evaluation 10/30/23    Authorization Type [place] MEDICA[employer]PAID HE[employer] PLAN    Progress Note Due on Visit 10    OT Start Time [id number]    OT Stop Time 0930    OT Time Calculation (min) 44 min    Equipment Utilized During Treatment None    Activity Tolerance Patient tolerated treatment well    Behavior During Therapy WFL for tasks assessed/performed            Past Medical History:  Diagnosis Date   Chlamydia 01/25/2010   Depression with anxiety    Gestational hypertension    resolved after delivery   Immunization, viral disease    gardasil series completed   Invasive carcinoma of breast (HCC)    right   Tobacco abuse 05/02/2016   Past Surgical History:  Procedure Laterality Date   AUGMENTATION MAMMAPLASTY Bilateral    saline implants 2015   BREAST BIOPSY Right 10/14/2022   US biopsy/ coil clip/ path pending   BREAST BIOPSY Right 10/14/2022   [place] RT BREAST BX W LOC DEV 1ST LESION IMG BX SPEC US GUIDE 10/14/2022 [facility]   BREAST BIOPSY Left 10/29/2022   [place] Core Bx Ribbon clip- path pending   BREAST BIOPSY Right 10/29/2022   [place] Core 1130 Venus Clip- path pending   BREAST BIOPSY Right 10/29/2022   [place] Core Bx Retroareolar heart clip-path pending   BREAST BIOPSY Right 10/29/2022   [place] Core Bx Ribbon Clip path pending   BREAST BIOPSY Right 10/29/2022   [place] RT BREAST BX W LOC DEV EA ADD LESION IMG BX SPEC US GUIDE 10/29/2022 [facility]   BREAST BIOPSY Right 10/29/2022   [place] RT BREAST BX W LOC DEV 1ST LESION IMG BX SPEC US GUIDE 10/29/2022 [facility]   BREAST BIOPSY Right 10/29/2022   [place] RT BREAST BX W LOC DEV EA ADD LESION IMG BX SPEC US GUIDE 10/29/2022 [facility]   BREAST BIOPSY Left  10/29/2022   [place] LT BREAST BX W LOC DEV 1ST LESION IMG BX SPEC US GUIDE 10/29/2022 [facility]   BREAST ENHANCEMENT SURGERY  2015   BREAST RECONSTRUCTION WITH PLACEMENT OF TISSUE EXPANDER AND FLEX HD (ACELLULAR HYDRATED DERMIS) Bilateral 05/08/2023   Procedure: Bilateral immediate breast reconstruction with expanders and Flex HD placement;  Surgeon: Peggye Form, DO;  Location: [facility];  Service: Plastics;  Laterality: Bilateral;   CESAREAN SECTION  2014   MASTECTOMY W/ SENTINEL NODE BIOPSY Bilateral 05/08/2023   Procedure: MASTECTOMY Simple WITH SENTINEL LYMPH NODE BIOPSY, RNFA to assist;  Surgeon: Leafy Ro, MD;  Location: [facility];  Service: General;  Laterality: Bilateral;   PORT-A-CATH REMOVAL  04/2023   PORTACATH PLACEMENT N/A 10/24/2022   PortA Cath removed; Procedure: INSERTION PORT-A-CATH;  Surgeon: Leafy Ro, MD;  Location: [facility];  Service: General;  Laterality: N/A;   REMOVAL OF BILATERAL TISSUE EXPANDERS WITH PLACEMENT OF BILATERAL BREAST IMPLANTS Bilateral 09/08/2023   Procedure: REMOVAL, TISSUE EXPANDER, BREAST, BILATERAL, WITH BILATERAL IMPLANT IMPLANT INSERTION;  Surgeon: Peggye Form, DO;  Location: [facility];  Service: Plastics;  Laterality: Bilateral;   TONSILLECTOMY     Patient Active Problem List   Diagnosis Date Noted  S/P bilateral mastectomy 05/08/2023   Genetic testing 10/30/2022   Invasive carcinoma of breast (HCC) 10/22/2022   Malignant neoplasm of upper-outer quadrant of right breast in female, estrogen receptor negative (HCC) 10/22/2022   Lump of right breast 09/18/2022   Weight gain 12/04/2020   Right wrist pain 09/27/2019   Contraceptive management 07/07/2019   Anxiety and depression 12/29/2015   PCP: Dr. Birdie Sons (Pt reports this MD just left the practice, but she will continue with this practice with another MD)  REFERRING PROVIDER: Keenan Bachelor, J, PA  REFERRING DIAG:  [phone] (ICD-10-CM) - Malignant neoplasm of  upper-outer quadrant of right female breast  Z17.1 (ICD-10-CM) - Estrogen receptor negative status (ER-)  C50.919 (ICD-10-CM) - Malignant neoplasm of unspecified site of unspecified female breast  Z98.890 (ICD-10-CM) - Other specified postprocedural states   THERAPY DIAG:  Axillary web syndrome  S/P bilateral mastectomy  Lymphedema of right arm  Rationale for Evaluation and Treatment: Rehabilitation  ONSET DATE: Feb 1st 2025  SUBJECTIVE:                                                                                                                                                                                          SUBJECTIVE STATEMENT: Pt reports she was able to pick up her compression sleeve and was able to don it for cleaning the house this weekend. Accompanied by: self  PERTINENT HISTORY: Per MEDICAL RECORD NUMBER Pt diagnosed with malignant neoplasm of upper-outer quadrant of right breast in female, estrogen receptor negative (HCC) 10/22/2022.  Pt is s/p bilat mastectomy and reconstruction with expanders in place 05/08/2023. Currently receiving p.o. chemo therapy with Xeloda, which she will finish early June.  PAIN: 09/29/23: 1-1.5/10 R volar elbow, biceps Are you having pain? None at rest; pain present with activity  NPRS scale: 5/10 Pain location: R volar arm from the shoulder to the wrist  Pain orientation: Right  PAIN TYPE: sharp, tight Pain description: intermittent/with activity Aggravating factors: extending the elbow/reaching into flexion and abd Relieving factors: rest   PRECAUTIONS: Lymphedema precautions: avoid needle sticks/BPs to RUE.  Pt is currently limiting herself to lifting no more than 40# at work.  Pt is currently undergoing chemotherapy p.o.  RED FLAGS: None   WEIGHT BEARING RESTRICTIONS: No  FALLS:  Has patient fallen in last 6 months? 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  OBJECTIVE: Note: Objective measures were completed at Evaluation unless otherwise noted.  COGNITION: Overall cognitive status: Within functional limits for tasks assessed   PALPATION: Mild cording R volar forearm, extending to upper arm and axilla.   OBSERVATIONS / OTHER ASSESSMENTS:  RUE: No visible swelling, but tenderness with palpation at the biceps and volar forearm.  Skin is taught throughout the RUE from the upper arm to the distal forearm; non-pitting.  Mild cording R volar forearm, extending to upper arm and axilla; LUE: non-tender, supple  SENSATION: reports tingling in the ulnar nerve distribution of RUE only with prolonged positioning of elbow in flexion (when playing video games or holding phone).  POSTURE: No significant postural limitations  UPPER EXTREMITY AROM/PROM: (Per PT eval on 08/07/23)      09/18/23: Will remeasure RUE AROM next week to allow an additional week for incision healing  AROM RIGHT   eval  Right 09/22/23  Shoulder extension    Shoulder flexion 135 142  Shoulder abduction 119 149  Shoulder internal rotation Observed WNL WNL  Shoulder external rotation 65 90  Elbow extension -10 0    (Blank rows = not tested)  A/PROM LEFT   eval LEFT 09/22/23  Shoulder extension    Shoulder flexion 150 144  Shoulder abduction 137 180  Shoulder internal rotation Observed WNL WNL  Shoulder external rotation 60 83  Elbow extension 0 0    (Blank rows = not tested)  UPPER EXTREMITY STRENGTH: RUE grossly 4/5, LUE grossly 4+/5 (Per PT eval on 08/07/23)  LYMPHEDEMA ASSESSMENTS:   SURGERY TYPE/DATE: 05/08/2023 bilat mastectomy with reconstruction   NUMBER OF LYMPH  NODES REMOVED: SLNB: 2 axillary lymph nodes removed   CHEMOTHERAPY: 16 rounds ending early/mid Oct 2024, now has a chemo pill 2x/day for 14 days, 1 week off for 8 cycles, ending in June  RADIATION: None  HORMONE TREATMENT: None  INFECTIONS: None  LYMPHEDEMA ASSESSMENTS:   UE circumferential measurements in cm  [place]mark R Date: 08/25/23 L Date: 08/25/23 R Date: R Date:  1  MP (distal base of SF) 18.7 17.9    2  Wrist crease 15.5 15.0    3 Forearm (*) +4 cm 15.0 14.5    4 * +4 cm 17.3 16.4    5 * +4 cm 20.0 18.8    6 * +4 cm 21.9 21.5    7 * +4 cm 22.8 22.7    8  Elbow crease 22.2 22.7    9 Upper arm (**) +4 cm 22.0 21.8    10 ** +4 cm 22.2 22.5    11 ** +4 cm 23.7 23.8    12 ** +4 cm 25.4 25.5                      Total circumferential cm 246.7 cm 243.1 cm      Total Limb  differential 3.6 cm  (R 1.5% larger than L; all  points of measure are <2 cm differential throughout RUE/LUE)        Change from initial        TODAY'S TREATMENT:  DATE: 09/29/23 Manual Therapy: -Soft tissue massage proximal>distal to target cording on volar side of RUE. -Alternated RUE position between "T," "W," stretch positions and abd at 45* and 60*during soft tissue massage  -Reviewed self MLD techniques/routing -To further target RUE cording: Performed MLD to the RUE, working proximal>distal>proximal, routing lymph fluid across chest toward L axilla; gentle pressure across the chest and while clearing R/L axillary nodes and abdomen to avoid any discomfort at surgical incisions beneath B breasts.    Therapeutic Exercise: -"T" stretch-hold x3 min (R arm only) -"W" stretch-hold x3 min (R arm only) -AAROM for R shoulder abd/flex, elbow flex/ext, ER  PATIENT EDUCATION:  Education details: recommended activities to don compression sleeve Person educated: Patient Education method:  Explanation Education comprehension: verbalized understanding  HOME EXERCISE PROGRAM: AWS exercises  ASSESSMENT: CLINICAL IMPRESSION: Pt reports she was able to pick up her compression sleeve last week and was able to don it for cleaning the house this weekend.  Pt reports good/comfortable fit of sleeve and acknowledges understanding of recommended activities for when she should don her sleeve (repetition or heavier work activities).  Discomfort at the R volar elbow and biceps tendon continues to improve gradually.  R shoulder flexibility continues to improve.  Planning to reduce visits to 1x per week this week and reassess next visit to ensure symptoms and function continue to improve, and will otherwise increase back to 2x per week as needed.  Pt will benefit from continued OT to work towards goals in [employer], focusing on reducing cording with therapeutic exercises and manual therapy.  OBJECTIVE IMPAIRMENTS: decreased knowledge of condition, decreased knowledge of use of DME, decreased ROM, decreased strength, increased edema, increased fascial restrictions, impaired flexibility, impaired sensation, impaired UE functional use, and pain.   ACTIVITY LIMITATIONS: carrying, lifting, dressing, reach over head, and caring for others  PARTICIPATION LIMITATIONS: occupation  PERSONAL FACTORS: 1-2 comorbidities: bilat mastectomy, reconstruction  are also affecting patient's functional outcome.   REHAB POTENTIAL: Excellent  CLINICAL DECISION MAKING: Evolving/moderate complexity  EVALUATION COMPLEXITY: Moderate  GOALS: Goals reviewed with patient? Yes  SHORT TERM GOALS: Target date: 09/15/23   1.  Pt will be indep to verbalize 2-3 lymphedema precautions to prevent infection and lymphedema flare ups. Baseline: Eval: Initiated educ on optimal skin care; further training needed; 09/17/33: indep with precautions Goal status: achieved  2.  Pt will be indep to verbalize 1-2 signs of lymphedema flare  ups and identify subsequent action steps. Baseline: Eval: Educ not yet initiated; 09/18/23: indep with s/s of flare ups and action steps Goal status: achieved  LONG TERM GOALS: Target date: 10/30/23  Pt will be indep to perform HEP for reducing R AWS and promoting RUE lymphatic drainage with use of visual handouts. Baseline: Eval: HEP not yet initiated; 09/18/23: HEP has been on hold over the last 2 weeks since pt's sx; pt has been given the ok to restart exercises this week to her tolerance; will assess independence with HEP next week Goal status: ongoing  2.  Pt will demonstrate self MLD technique with 75% accuracy using visual handouts as needed to promote lymphatic drainage throughout the RUE. Baseline: Eval: Instruction not yet initiated; 09/18/23: Limited BUE ROM post breast implant sx last week, so self MLD not yet attempted.  Pt requires min-mod vc for sequencing when reviewing verbally Goal status: ongoing  3.  Pt will demonstrate soft tissue massage technique with 75% accuracy using visual/written handouts as needed to reduce/release R AWS in the  RUE.  Baseline: Eval: Not yet initiated; 09/18/23: indep with technique but currently limited with ability to perform self massage d/t limited ROM post breast sx last week, and family not consistently available to perform.   Goal status: ongoing  4.  Pt will demonstrate independence with donning/doffing class 1 compression sleeve and gauntlet to the RUE. Baseline: Eval: Not yet obtained; 09/18/23: Pt planning to pick up her sleeve today Goal status: ongoing  5.  Pt will demonstrate 75% or better adherence to recommended wearing schedule for compression sleeve/glove. Baseline: Eval: Not yet obtained/wearing schedule not yet reviewed; 09/18/23: Defer; Pt will be picking up sleeve today Goal status: ongoing   PLAN:  OT FREQUENCY: 2x/week  OT DURATION: 6 weeks  PLANNED INTERVENTIONS: 11914- OT Re-Evaluation, 97110-Therapeutic exercises, 97530-  Therapeutic activity, 97535- Self Care, [id number]- Manual therapy, 97760- Orthotic Fit/training, Patient/Family education, Joint mobilization, Manual lymph drainage, Scar mobilization, Compression bandaging, DME instructions, Cryotherapy, and Moist heat  PLAN FOR NEXT SESSION: manual therapy, review AWS HEP   Danelle Earthly, MS, OTR/L, CLT  Otis Dials, OT 09/29/2023, 10:24 AM

## 2023-09-30 ENCOUNTER — Ambulatory Visit: Admitting: Plastic Surgery

## 2023-09-30 ENCOUNTER — Ambulatory Visit: Payer: Medicaid Other | Admitting: Physical Therapy

## 2023-09-30 ENCOUNTER — Encounter: Payer: Self-pay | Admitting: Plastic Surgery

## 2023-09-30 ENCOUNTER — Other Ambulatory Visit: Payer: Self-pay

## 2023-09-30 VITALS — BP 121/83 | HR 83

## 2023-09-30 DIAGNOSIS — Z9013 Acquired absence of bilateral breasts and nipples: Secondary | ICD-10-CM

## 2023-09-30 NOTE — Progress Notes (Signed)
 The patient is a 33 year old female here for follow-up after breast reconstruction.  She underwent removal of her expanders with placement of implants on March 17.  She has [place]or smooth round ultrahigh profile 400 cc implants.  She is extremely pleased with her results.  There is no sign of infection or seroma.  She has a little bit of rippling which we knew was a possibility.  We will possibly do some fat grafting for that but we will give her some time to get through all of her cancer treatment.  Will plan to see her back in 6 months.  Pictures were obtained of the patient and placed in the chart with the patient's or guardian's permission.

## 2023-10-01 ENCOUNTER — Ambulatory Visit: Payer: Medicaid Other

## 2023-10-01 ENCOUNTER — Other Ambulatory Visit: Payer: Self-pay

## 2023-10-02 ENCOUNTER — Other Ambulatory Visit: Payer: Self-pay | Admitting: *Deleted

## 2023-10-02 ENCOUNTER — Ambulatory Visit: Payer: Medicaid Other | Admitting: Physical Therapy

## 2023-10-06 ENCOUNTER — Ambulatory Visit: Payer: Medicaid Other | Admitting: Physical Therapy

## 2023-10-06 ENCOUNTER — Ambulatory Visit: Payer: Medicaid Other

## 2023-10-06 DIAGNOSIS — L7682 Other postprocedural complications of skin and subcutaneous tissue: Secondary | ICD-10-CM | POA: Diagnosis not present

## 2023-10-06 DIAGNOSIS — Z9013 Acquired absence of bilateral breasts and nipples: Secondary | ICD-10-CM

## 2023-10-06 DIAGNOSIS — I890 Lymphedema, not elsewhere classified: Secondary | ICD-10-CM

## 2023-10-06 NOTE — Therapy (Unsigned)
 OUTPATIENT OCCUPATIONAL THERAPY LYMPHEDEMA TREATMENT NOTE  Patient Name: Eileen Hart MRN: [id number] DOB:[birth year], 33 y.o., female Today's Date: 10/06/2023  END OF SESSION:    Past Medical History:  Diagnosis Date   Chlamydia 01/25/2010   Depression with anxiety    Gestational hypertension    resolved after delivery   Immunization, viral disease    gardasil series completed   Invasive carcinoma of breast (HCC)    right   Tobacco abuse 05/02/2016   Past Surgical History:  Procedure Laterality Date   AUGMENTATION MAMMAPLASTY Bilateral    saline implants 2015   BREAST BIOPSY Right 10/14/2022   US biopsy/ coil clip/ path pending   BREAST BIOPSY Right 10/14/2022   [place] RT BREAST BX W LOC DEV 1ST LESION IMG BX SPEC US GUIDE 10/14/2022 [facility]   BREAST BIOPSY Left 10/29/2022   [place] Core Bx Ribbon clip- path pending   BREAST BIOPSY Right 10/29/2022   [place] Core 1130 Venus Clip- path pending   BREAST BIOPSY Right 10/29/2022   [place] Core Bx Retroareolar heart clip-path pending   BREAST BIOPSY Right 10/29/2022   [place] Core Bx Ribbon Clip path pending   BREAST BIOPSY Right 10/29/2022   [place] RT BREAST BX W LOC DEV EA ADD LESION IMG BX SPEC US GUIDE 10/29/2022 [facility]   BREAST BIOPSY Right 10/29/2022   [place] RT BREAST BX W LOC DEV 1ST LESION IMG BX SPEC US GUIDE 10/29/2022 [facility]   BREAST BIOPSY Right 10/29/2022   [place] RT BREAST BX W LOC DEV EA ADD LESION IMG BX SPEC US GUIDE 10/29/2022 [facility]   BREAST BIOPSY Left 10/29/2022   [place] LT BREAST BX W LOC DEV 1ST LESION IMG BX SPEC US GUIDE 10/29/2022 [facility]   BREAST ENHANCEMENT SURGERY  2015   BREAST RECONSTRUCTION WITH PLACEMENT OF TISSUE EXPANDER AND FLEX HD (ACELLULAR HYDRATED DERMIS) Bilateral 05/08/2023   Procedure: Bilateral immediate breast reconstruction with expanders and Flex HD placement;  Surgeon: Peggye Form, DO;  Location: [facility];  Service: Plastics;  Laterality: Bilateral;   CESAREAN  SECTION  2014   MASTECTOMY W/ SENTINEL NODE BIOPSY Bilateral 05/08/2023   Procedure: MASTECTOMY Simple WITH SENTINEL LYMPH NODE BIOPSY, RNFA to assist;  Surgeon: Leafy Ro, MD;  Location: [facility];  Service: General;  Laterality: Bilateral;   PORT-A-CATH REMOVAL  04/2023   PORTACATH PLACEMENT N/A 10/24/2022   PortA Cath removed; Procedure: INSERTION PORT-A-CATH;  Surgeon: Leafy Ro, MD;  Location: [facility];  Service: General;  Laterality: N/A;   REMOVAL OF BILATERAL TISSUE EXPANDERS WITH PLACEMENT OF BILATERAL BREAST IMPLANTS Bilateral 09/08/2023   Procedure: REMOVAL, TISSUE EXPANDER, BREAST, BILATERAL, WITH BILATERAL IMPLANT IMPLANT INSERTION;  Surgeon: Peggye Form, DO;  Location: [facility];  Service: Plastics;  Laterality: Bilateral;   TONSILLECTOMY     Patient Active Problem List   Diagnosis Date Noted   S/P bilateral mastectomy 05/08/2023   Genetic testing 10/30/2022   Invasive carcinoma of breast (HCC) 10/22/2022   Malignant neoplasm of upper-outer quadrant of right breast in female, estrogen receptor negative (HCC) 10/22/2022   Lump of right breast 09/18/2022   Weight gain 12/04/2020   Right wrist pain 09/27/2019   Contraceptive management 07/07/2019   Anxiety and depression 12/29/2015   PCP: Dr. Birdie Sons (Pt reports this MD just left the practice, but she will continue with this practice with another MD)  REFERRING PROVIDER: Keenan Bachelor, J, PA  REFERRING DIAG:  [phone] (ICD-10-CM) - Malignant neoplasm of upper-outer quadrant of right  female breast  Z17.1 (ICD-10-CM) - Estrogen receptor negative status (ER-)  C50.919 (ICD-10-CM) - Malignant neoplasm of unspecified site of unspecified female breast  Z98.890 (ICD-10-CM) - Other specified postprocedural states   THERAPY DIAG:  No diagnosis found.  Rationale for Evaluation and Treatment: Rehabilitation  ONSET DATE: Feb 1st 2025  SUBJECTIVE:                                                                                                                                                                                           SUBJECTIVE STATEMENT: Pt reports she was able to pick up her compression sleeve and was able to don it for cleaning the house this weekend. Accompanied by: self  PERTINENT HISTORY: Per MEDICAL RECORD NUMBER Pt diagnosed with malignant neoplasm of upper-outer quadrant of right breast in female, estrogen receptor negative (HCC) 10/22/2022.  Pt is s/p bilat mastectomy and reconstruction with expanders in place 05/08/2023. Currently receiving p.o. chemo therapy with Xeloda, which she will finish early June.  PAIN: 10/06/23: 0 at rest, 2 with elbow ext and massage1-1.5/10 R volar elbow, biceps Are you having pain? None at rest; pain present with activity  NPRS scale: 5/10 Pain location: R volar arm from the shoulder to the wrist  Pain orientation: Right  PAIN TYPE: sharp, tight Pain description: intermittent/with activity Aggravating factors: extending the elbow/reaching into flexion and abd Relieving factors: rest   PRECAUTIONS: Lymphedema precautions: avoid needle sticks/BPs to RUE.  Pt is currently limiting herself to lifting no more than 40# at work.  Pt is currently undergoing chemotherapy p.o.  RED FLAGS: None   WEIGHT BEARING RESTRICTIONS: No  FALLS:  Has patient fallen in last 6 months? 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  OBJECTIVE: Note: Objective measures were completed at Evaluation unless otherwise noted.  COGNITION: Overall cognitive status: Within functional limits for tasks  assessed   PALPATION: Mild cording R volar forearm, extending to upper arm and axilla.   OBSERVATIONS / OTHER ASSESSMENTS:  RUE: No visible swelling, but tenderness with palpation at the biceps and volar forearm.  Skin is taught throughout the RUE from the upper arm to the distal forearm; non-pitting.  Mild cording R volar forearm, extending to upper arm and axilla; LUE:  non-tender, supple  SENSATION: reports tingling in the ulnar nerve distribution of RUE only with prolonged positioning of elbow in flexion (when playing video games or holding phone).  POSTURE: No significant postural limitations  UPPER EXTREMITY AROM/PROM: (Per PT eval on 08/07/23)      09/18/23: Will remeasure RUE AROM next week to allow an additional week for incision healing  AROM RIGHT   eval  Right 09/22/23 Right 10/06/23  Shoulder extension     Shoulder flexion 135 142 146  Shoulder abduction 119 149 180  Shoulder internal rotation Observed WNL WNL   Shoulder external rotation 65 90 95  Elbow extension -10 0     (Blank rows = not tested)  A/PROM LEFT   eval LEFT 09/22/23   Shoulder extension     Shoulder flexion 150 144 160  Shoulder abduction 137 180 180  Shoulder internal rotation Observed WNL WNL   Shoulder external rotation 60 83 84  Elbow extension 0 0     (Blank rows = not tested)  UPPER EXTREMITY STRENGTH: RUE grossly 4/5, LUE grossly 4+/5 (Per PT eval on 08/07/23)  LYMPHEDEMA ASSESSMENTS:   SURGERY TYPE/DATE: 05/08/2023 bilat mastectomy with reconstruction   NUMBER OF LYMPH NODES REMOVED: SLNB: 2 axillary lymph nodes removed   CHEMOTHERAPY: 16 rounds ending early/mid Oct 2024, now has a chemo pill 2x/day for 14 days, 1 week off for 8 cycles, ending in June  RADIATION: None  HORMONE TREATMENT: None  INFECTIONS: None  LYMPHEDEMA ASSESSMENTS:   UE circumferential measurements in cm  [place]mark R Date: 08/25/23 L Date: 08/25/23 R Date: R Date:  1  MP (distal base of SF) 18.7 17.9    2   Wrist crease 15.5 15.0    3 Forearm (*) +4 cm 15.0 14.5    4 * +4 cm 17.3 16.4    5 * +4 cm 20.0 18.8    6 * +4 cm 21.9 21.5    7 * +4 cm 22.8 22.7    8  Elbow crease 22.2 22.7    9 Upper arm (**) +4 cm 22.0 21.8    10 ** +4 cm 22.2 22.5    11 ** +4 cm 23.7 23.8    12 ** +4 cm 25.4 25.5                      Total circumferential cm 246.7 cm 243.1 cm      Total Limb  differential 3.6 cm  (R 1.5% larger than L; all  points of measure are <2 cm differential throughout RUE/LUE)        Change from initial        TODAY'S TREATMENT:                                                                                                                              DATE: 09/29/23 Manual Therapy: -Soft tissue massage proximal>distal to target cording  on volar side of RUE. -Alternated RUE position between "T," "W," stretch positions and abd at 45* and 60*during soft tissue massage  -Reviewed self MLD techniques/routing -To further target RUE cording: Performed MLD to the RUE, working proximal>distal>proximal, routing lymph fluid across chest toward L axilla; gentle pressure across the chest and while clearing R/L axillary nodes and abdomen to avoid any discomfort at surgical incisions beneath B breasts.    Therapeutic Exercise: -"T" stretch-hold x3 min (R arm only) -"W" stretch-hold x3 min (R arm only) -AAROM for R shoulder abd/flex, elbow flex/ext, ER  PATIENT EDUCATION:  Education details: recommended activities to don compression sleeve Person educated: Patient Education method: Explanation Education comprehension: verbalized understanding  HOME EXERCISE PROGRAM: AWS exercises  ASSESSMENT: CLINICAL IMPRESSION: Pt reports she was able to pick up her compression sleeve last week and was able to don it for cleaning the house this weekend.  Pt reports good/comfortable fit of sleeve and acknowledges understanding of recommended activities for when she should don her sleeve (repetition or heavier  work activities).  Discomfort at the R volar elbow and biceps tendon continues to improve gradually.  R shoulder flexibility continues to improve.  Planning to reduce visits to 1x per week this week and reassess next visit to ensure symptoms and function continue to improve, and will otherwise increase back to 2x per week as needed.  Pt will benefit from continued OT to work towards goals in [employer], focusing on reducing cording with therapeutic exercises and manual therapy.  OBJECTIVE IMPAIRMENTS: decreased knowledge of condition, decreased knowledge of use of DME, decreased ROM, decreased strength, increased edema, increased fascial restrictions, impaired flexibility, impaired sensation, impaired UE functional use, and pain.   ACTIVITY LIMITATIONS: carrying, lifting, dressing, reach over head, and caring for others  PARTICIPATION LIMITATIONS: occupation  PERSONAL FACTORS: 1-2 comorbidities: bilat mastectomy, reconstruction  are also affecting patient's functional outcome.   REHAB POTENTIAL: Excellent  CLINICAL DECISION MAKING: Evolving/moderate complexity  EVALUATION COMPLEXITY: Moderate  GOALS: Goals reviewed with patient? Yes  SHORT TERM GOALS: Target date: 09/15/23   1.  Pt will be indep to verbalize 2-3 lymphedema precautions to prevent infection and lymphedema flare ups. Baseline: Eval: Initiated educ on optimal skin care; further training needed; 09/17/33: indep with precautions Goal status: achieved  2.  Pt will be indep to verbalize 1-2 signs of lymphedema flare ups and identify subsequent action steps. Baseline: Eval: Educ not yet initiated; 09/18/23: indep with s/s of flare ups and action steps Goal status: achieved  LONG TERM GOALS: Target date: 10/30/23  Pt will be indep to perform HEP for reducing R AWS and promoting RUE lymphatic drainage with use of visual handouts. Baseline: Eval: HEP not yet initiated; 09/18/23: HEP has been on hold over the last 2 weeks since pt's sx; pt  has been given the ok to restart exercises this week to her tolerance; will assess independence with HEP next week Goal status: ongoing  2.  Pt will demonstrate self MLD technique with 75% accuracy using visual handouts as needed to promote lymphatic drainage throughout the RUE. Baseline: Eval: Instruction not yet initiated; 09/18/23: Limited BUE ROM post breast implant sx last week, so self MLD not yet attempted.  Pt requires min-mod vc for sequencing when reviewing verbally Goal status: ongoing  3.  Pt will demonstrate soft tissue massage technique with 75% accuracy using visual/written handouts as needed to reduce/release R AWS in the  RUE. Baseline: Eval: Not yet initiated; 09/18/23: indep with technique but currently  limited with ability to perform self massage d/t limited ROM post breast sx last week, and family not consistently available to perform.   Goal status: ongoing  4.  Pt will demonstrate independence with donning/doffing class 1 compression sleeve and gauntlet to the RUE. Baseline: Eval: Not yet obtained; 09/18/23: Pt planning to pick up her sleeve today Goal status: ongoing  5.  Pt will demonstrate 75% or better adherence to recommended wearing schedule for compression sleeve/glove. Baseline: Eval: Not yet obtained/wearing schedule not yet reviewed; 09/18/23: Defer; Pt will be picking up sleeve today Goal status: ongoing   PLAN:  OT FREQUENCY: 2x/week  OT DURATION: 6 weeks  PLANNED INTERVENTIONS: 82956- OT Re-Evaluation, 97110-Therapeutic exercises, 97530- Therapeutic activity, 97535- Self Care, 21308- Manual therapy, 97760- Orthotic Fit/training, Patient/Family education, Joint mobilization, Manual lymph drainage, Scar mobilization, Compression bandaging, DME instructions, Cryotherapy, and Moist heat  PLAN FOR NEXT SESSION: manual therapy, review AWS HEP   Marcus Sewer, MS, OTR/L, CLT  Casandra Claw, OT 10/06/2023, 10:46 AM

## 2023-10-07 ENCOUNTER — Other Ambulatory Visit: Payer: Self-pay

## 2023-10-07 ENCOUNTER — Other Ambulatory Visit (HOSPITAL_COMMUNITY): Payer: Self-pay

## 2023-10-07 NOTE — Progress Notes (Signed)
 Specialty Pharmacy Refill Coordination Note  Eileen Hart is a 33 y.o. female contacted today regarding refills of specialty medication(s) Xeloda.  Patient requested (Patient-Rptd) Delivery   Delivery date: (Patient-Rptd) 10/09/23   Verified address: (Patient-Rptd) [address] [place], [place] [address]   Medication will be filled on 10/08/23.

## 2023-10-08 ENCOUNTER — Ambulatory Visit: Admitting: Occupational Therapy

## 2023-10-08 ENCOUNTER — Other Ambulatory Visit: Payer: Self-pay

## 2023-10-08 ENCOUNTER — Ambulatory Visit: Payer: Medicaid Other

## 2023-10-08 DIAGNOSIS — L7682 Other postprocedural complications of skin and subcutaneous tissue: Secondary | ICD-10-CM

## 2023-10-08 DIAGNOSIS — Z9013 Acquired absence of bilateral breasts and nipples: Secondary | ICD-10-CM

## 2023-10-08 DIAGNOSIS — C50411 Malignant neoplasm of upper-outer quadrant of right female breast: Secondary | ICD-10-CM | POA: Diagnosis not present

## 2023-10-08 DIAGNOSIS — Z171 Estrogen receptor negative status [ER-]: Secondary | ICD-10-CM

## 2023-10-08 NOTE — Progress Notes (Unsigned)
 Survivorship Care Plan visit completed.  Treatment summary reviewed and given to patient.  ASCO answers booklet reviewed and given to patient.  CARE program and Cancer Transitions discussed with patient along with other resources cancer center offers to patients and caregivers.  Patient verbalized understanding.

## 2023-10-08 NOTE — Therapy (Signed)
 OUTPATIENT OCCUPATIONAL THERAPY LYMPHEDEMA TREATMENT NOTE  Patient Name: Eileen Hart MRN: [id number] DOB:April 19, 1991, 33 y.o., female Today's Date: 10/08/2023  END OF SESSION:   OT End of Session - 10/08/23 1734     Visit Number 11    Number of Visits 18    Date for OT Re-Evaluation 10/30/23    Authorization Type [place] MEDICAID PREPAID HEALTH PLAN    OT Start Time 0900    OT Stop Time 0925    OT Time Calculation (min) 25 min    Activity Tolerance Patient tolerated treatment well    Behavior During Therapy WFL for tasks assessed/performed            Past Medical History:  Diagnosis Date   Chlamydia 01/25/2010   Depression with anxiety    Gestational hypertension    resolved after delivery   Immunization, viral disease    gardasil series completed   Invasive carcinoma of breast (HCC)    right   Tobacco abuse 05/02/2016   Past Surgical History:  Procedure Laterality Date   AUGMENTATION MAMMAPLASTY Bilateral    saline implants 2015   BREAST BIOPSY Right 10/14/2022   US biopsy/ coil clip/ path pending   BREAST BIOPSY Right 10/14/2022   [place] RT BREAST BX W LOC DEV 1ST LESION IMG BX SPEC US GUIDE 10/14/2022 [facility]   BREAST BIOPSY Left 10/29/2022   [place] Core Bx Ribbon clip- path pending   BREAST BIOPSY Right 10/29/2022   [place] Core 1130 Venus Clip- path pending   BREAST BIOPSY Right 10/29/2022   [place] Core Bx Retroareolar heart clip-path pending   BREAST BIOPSY Right 10/29/2022   [place] Core Bx Ribbon Clip path pending   BREAST BIOPSY Right 10/29/2022   [place] RT BREAST BX W LOC DEV EA ADD LESION IMG BX SPEC US GUIDE 10/29/2022 [facility]   BREAST BIOPSY Right 10/29/2022   [place] RT BREAST BX W LOC DEV 1ST LESION IMG BX SPEC US GUIDE 10/29/2022 [facility]   BREAST BIOPSY Right 10/29/2022   [place] RT BREAST BX W LOC DEV EA ADD LESION IMG BX SPEC US GUIDE 10/29/2022 [facility]   BREAST BIOPSY Left 10/29/2022   [place] LT BREAST BX W LOC DEV 1ST LESION IMG BX SPEC US GUIDE  10/29/2022 [facility]   BREAST ENHANCEMENT SURGERY  2015   BREAST RECONSTRUCTION WITH PLACEMENT OF TISSUE EXPANDER AND FLEX HD (ACELLULAR HYDRATED DERMIS) Bilateral 05/08/2023   Procedure: Bilateral immediate breast reconstruction with expanders and Flex HD placement;  Surgeon: Peggye Form, DO;  Location: [facility];  Service: Plastics;  Laterality: Bilateral;   CESAREAN SECTION  2014   MASTECTOMY W/ SENTINEL NODE BIOPSY Bilateral 05/08/2023   Procedure: MASTECTOMY Simple WITH SENTINEL LYMPH NODE BIOPSY, RNFA to assist;  Surgeon: Leafy Ro, MD;  Location: [facility];  Service: General;  Laterality: Bilateral;   PORT-A-CATH REMOVAL  04/2023   PORTACATH PLACEMENT N/A 10/24/2022   PortA Cath removed; Procedure: INSERTION PORT-A-CATH;  Surgeon: Leafy Ro, MD;  Location: [facility];  Service: General;  Laterality: N/A;   REMOVAL OF BILATERAL TISSUE EXPANDERS WITH PLACEMENT OF BILATERAL BREAST IMPLANTS Bilateral 09/08/2023   Procedure: REMOVAL, TISSUE EXPANDER, BREAST, BILATERAL, WITH BILATERAL IMPLANT IMPLANT INSERTION;  Surgeon: Peggye Form, DO;  Location: [facility];  Service: Plastics;  Laterality: Bilateral;   TONSILLECTOMY     Patient Active Problem List   Diagnosis Date Noted   S/P bilateral mastectomy 05/08/2023   Genetic testing 10/30/2022   Invasive carcinoma of breast (HCC)  10/22/2022   Malignant neoplasm of upper-outer quadrant of right breast in female, estrogen receptor negative (HCC) 10/22/2022   Lump of right breast 09/18/2022   Weight gain 12/04/2020   Right wrist pain 09/27/2019   Contraceptive management 07/07/2019   Anxiety and depression 12/29/2015   PCP: Dr. Lovetta Rucks (Pt reports this MD just left the practice, but she will continue with this practice with another MD)  REFERRING PROVIDER: Perrin Brakeman, J, PA  REFERRING DIAG:  [phone] (ICD-10-CM) - Malignant neoplasm of upper-outer quadrant of right female breast  Z17.1 (ICD-10-CM) - Estrogen  receptor negative status (ER-)  C50.919 (ICD-10-CM) - Malignant neoplasm of unspecified site of unspecified female breast  Z98.890 (ICD-10-CM) - Other specified postprocedural states   THERAPY DIAG:  Axillary web syndrome  S/P bilateral mastectomy  Rationale for Evaluation and Treatment: Rehabilitation  ONSET DATE: Feb 1st 2025  SUBJECTIVE:                                                                                                                                                                                          SUBJECTIVE STATEMENT: Patient reports she has been using her sleeve 6 to 8 hours.  Causing little soreness of the dorsal forearm but no pain.  Lymphatic cording is much better. Accompanied by: self and mom  PERTINENT HISTORY: Per MEDICAL RECORD NUMBER Pt diagnosed with malignant neoplasm of upper-outer quadrant of right breast in female, estrogen receptor negative (HCC) 10/22/2022.  Pt is s/p bilat mastectomy and reconstruction with expanders in place 05/08/2023. Currently receiving p.o. chemo therapy with Xeloda, which she will finish early June.  PAIN: 09/29/23: 1-1.5/10 R volar elbow, biceps Are you having pain? None at rest; pain present with activity  NPRS scale: 5/10 Pain location: R volar arm from the shoulder to the wrist  Pain orientation: Right  PAIN TYPE: sharp, tight Pain description: intermittent/with activity Aggravating factors: extending the elbow/reaching into flexion and abd Relieving factors: rest   PRECAUTIONS: Lymphedema precautions: avoid needle sticks/BPs to RUE.  Pt is currently limiting herself to lifting no more than 40# at work.  Pt is currently undergoing chemotherapy p.o.  RED FLAGS: None   WEIGHT BEARING RESTRICTIONS: No  FALLS:  Has patient fallen in last 6 months? 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  OBJECTIVE: Note: Objective measures were completed at Evaluation unless otherwise noted.  COGNITION: Overall cognitive status: Within functional limits for tasks assessed   PALPATION: Mild cording R volar forearm, extending to upper arm and axilla.   OBSERVATIONS / OTHER ASSESSMENTS:  RUE: No visible swelling, but tenderness with palpation at the biceps and volar forearm.  Skin is taught throughout the RUE from the upper arm to the distal forearm; non-pitting.  Mild cording R volar forearm, extending to upper arm and axilla; LUE: non-tender, supple  SENSATION: reports tingling in the ulnar nerve distribution of RUE only with prolonged positioning of elbow in flexion (when playing video games or holding phone).  POSTURE: No significant postural limitations  UPPER EXTREMITY AROM/PROM: (Per PT eval on 08/07/23)      09/18/23: Will remeasure RUE AROM next week to allow an additional week for incision healing  AROM RIGHT   eval  Right 09/22/23  Shoulder extension    Shoulder flexion 135 142  Shoulder abduction 119 149  Shoulder internal rotation Observed WNL WNL  Shoulder external rotation 65 90  Elbow extension -10 0    (Blank rows = not tested)  A/PROM LEFT   eval LEFT 09/22/23  Shoulder extension    Shoulder flexion 150 144  Shoulder abduction 137 180  Shoulder internal rotation Observed WNL WNL  Shoulder external rotation 60 83  Elbow extension 0 0    (Blank rows = not tested)  UPPER EXTREMITY STRENGTH: RUE grossly 4/5, LUE grossly 4+/5 (Per PT eval on 08/07/23)  LYMPHEDEMA ASSESSMENTS:   SURGERY TYPE/DATE: 05/08/2023 bilat mastectomy with reconstruction   NUMBER OF LYMPH NODES REMOVED: SLNB: 2 axillary lymph nodes removed    CHEMOTHERAPY: 16 rounds ending early/mid Oct 2024, now has a chemo pill 2x/day for 14 days, 1 week off for 8 cycles, ending in June  RADIATION: None  HORMONE TREATMENT: None  INFECTIONS: None  LYMPHEDEMA ASSESSMENTS:   UE circumferential measurements in cm  [place]mark R Date: 08/25/23 L Date: 08/25/23 R Date: R Date:  1  MP (distal base of SF) 18.7 17.9    2  Wrist crease 15.5 15.0    3 Forearm (*) +4 cm 15.0 14.5    4 * +4 cm 17.3 16.4    5 * +4 cm 20.0 18.8    6 * +4 cm 21.9 21.5    7 * +4 cm 22.8 22.7    8  Elbow crease 22.2 22.7    9 Upper arm (**) +4 cm 22.0 21.8    10 ** +4 cm 22.2 22.5    11 ** +4 cm 23.7 23.8    12 ** +4 cm 25.4 25.5                      Total circumferential cm 246.7 cm 243.1 cm      Total Limb  differential 3.6 cm  (R 1.5% larger than L; all  points of measure are <2 cm differential throughout RUE/LUE)        Change from initial         L-DEX FLOWSHEETS - 10/08/23 1700       L-DEX LYMPHEDEMA SCREENING   Measurement Type Unilateral    L-DEX MEASUREMENT EXTREMITY Upper Extremity    POSITION  Standing    DOMINANT SIDE Right    At Risk Side Right    BASELINE SCORE (UNILATERAL) 0.1  TODAY'S TREATMENT:                                                                                                                              DATE: 09/29/23 Manual Therapy: -Soft tissue massage proximal>distal to target cording on volar side of RUE. -Alternated RUE position between "T," "W," stretch positions and abd at 45* and 60*during soft tissue massage  -Reviewed self MLD techniques/routing -To further target RUE cording: Performed MLD to the RUE, working proximal>distal>proximal, routing lymph fluid across chest toward L axilla; gentle pressure across the chest and while clearing R/L axillary nodes and abdomen to avoid any discomfort at surgical incisions beneath B breasts.    Therapeutic Exercise: -"T" stretch-hold x3 min (R arm only) -"W"  stretch-hold x3 min (R arm only) -AAROM for R shoulder abd/flex, elbow flex/ext, ER    OT SESSION 10/08/23: Pt seen for SOZO screening for L-Dex score to get a baseline.  Did not do 1 prior to surgery.  Patient scored within normal limits.  Recommend for patient to continue to wear her over-the-counter compression sleeve to decrease lymphatic cording and decreasing risk for lymphedema for about 3 more weeks.  L-Dex score can be repeated at that time and reassess if can decrease wearing of compression sleeve to only high risk activities.  And if needed can repeat another 3 to 4 weeks to see if need compression sleeve. Reviewed with patient donning of compression sleeve to decrease strain on shoulder and upper trap. As well as patient can do same home exercises for release of lymphatic cording with rotation of hip maybe added Can also try child's pose for facilitation to  release of lymphatic cording Patient to keep stretch or pull under 1-2/10 and can wear compression sleeve.  PATIENT EDUCATION:  Education details: recommended activities to don compression sleeve Person educated: Patient Education method: Explanation Education comprehension: verbalized understanding  HOME EXERCISE PROGRAM: AWS exercises  ASSESSMENT: CLINICAL IMPRESSION: Patient report lymphatic cording improving.  Has been wearing her over-the-counter compression sleeve for 6 to 8 hours a day.  Patient's L-Dex score was done today that appears to be within normal limits.  Recommend for patient to continue to wear for another 3 weeks 6 to 8 hours and can repeat L-Dex score.  If within normal limits cannot decrease it to high risk activities only.  At that time can repeat this L-Dex score again and see if continues to need it - depending on improvement of lymphatic cording.  Pt will benefit from continued OT to work towards goals in [employer], focusing on reducing cording with therapeutic exercises and manual therapy.  OBJECTIVE  IMPAIRMENTS: decreased knowledge of condition, decreased knowledge of use of DME, decreased ROM, decreased strength, increased edema, increased fascial restrictions, impaired flexibility, impaired sensation, impaired UE functional use, and pain.   ACTIVITY LIMITATIONS: carrying, lifting, dressing, reach over head, and caring for others  PARTICIPATION LIMITATIONS: occupation  PERSONAL FACTORS: 1-2 comorbidities: bilat  mastectomy, reconstruction  are also affecting patient's functional outcome.   REHAB POTENTIAL: Excellent  CLINICAL DECISION MAKING: Evolving/moderate complexity  EVALUATION COMPLEXITY: Moderate  GOALS: Goals reviewed with patient? Yes  SHORT TERM GOALS: Target date: 09/15/23   1.  Pt will be indep to verbalize 2-3 lymphedema precautions to prevent infection and lymphedema flare ups. Baseline: Eval: Initiated educ on optimal skin care; further training needed; 09/17/33: indep with precautions Goal status: achieved  2.  Pt will be indep to verbalize 1-2 signs of lymphedema flare ups and identify subsequent action steps. Baseline: Eval: Educ not yet initiated; 09/18/23: indep with s/s of flare ups and action steps Goal status: achieved  LONG TERM GOALS: Target date: 10/30/23  Pt will be indep to perform HEP for reducing R AWS and promoting RUE lymphatic drainage with use of visual handouts. Baseline: Eval: HEP not yet initiated; 09/18/23: HEP has been on hold over the last 2 weeks since pt's sx; pt has been given the ok to restart exercises this week to her tolerance; will assess independence with HEP next week Goal status: ongoing  2.  Pt will demonstrate self MLD technique with 75% accuracy using visual handouts as needed to promote lymphatic drainage throughout the RUE. Baseline: Eval: Instruction not yet initiated; 09/18/23: Limited BUE ROM post breast implant sx last week, so self MLD not yet attempted.  Pt requires min-mod vc for sequencing when reviewing verbally Goal  status: ongoing  3.  Pt will demonstrate soft tissue massage technique with 75% accuracy using visual/written handouts as needed to reduce/release R AWS in the  RUE. Baseline: Eval: Not yet initiated; 09/18/23: indep with technique but currently limited with ability to perform self massage d/t limited ROM post breast sx last week, and family not consistently available to perform.   Goal status: ongoing  4.  Pt will demonstrate independence with donning/doffing class 1 compression sleeve and gauntlet to the RUE. Baseline: Eval: Not yet obtained; 09/18/23: Pt planning to pick up her sleeve today Goal status: ongoing  5.  Pt will demonstrate 75% or better adherence to recommended wearing schedule for compression sleeve/glove. Baseline: Eval: Not yet obtained/wearing schedule not yet reviewed; 09/18/23: Defer; Pt will be picking up sleeve today Goal status: ongoing   PLAN:  OT FREQUENCY: 2x/week  OT DURATION: 6 weeks  PLANNED INTERVENTIONS: 40981- OT Re-Evaluation, 97110-Therapeutic exercises, 97530- Therapeutic activity, 97535- Self Care, [id number]- Manual therapy, 97760- Orthotic Fit/training, Patient/Family education, Joint mobilization, Manual lymph drainage, Scar mobilization, Compression bandaging, DME instructions, Cryotherapy, and Moist heat  PLAN FOR NEXT SESSION: manual therapy, review AWS HEP   Marcus Sewer, MS, OTR/L, CLT  Heloise Lobo, OTR/L,CLT 10/08/2023, 5:40 PM

## 2023-10-09 ENCOUNTER — Encounter: Payer: Self-pay | Admitting: Oncology

## 2023-10-13 ENCOUNTER — Ambulatory Visit: Payer: Medicaid Other

## 2023-10-13 ENCOUNTER — Ambulatory Visit: Payer: Medicaid Other | Admitting: Physical Therapy

## 2023-10-15 ENCOUNTER — Ambulatory Visit: Payer: Medicaid Other | Admitting: Physical Therapy

## 2023-10-15 ENCOUNTER — Ambulatory Visit: Payer: Medicaid Other

## 2023-10-15 DIAGNOSIS — M6281 Muscle weakness (generalized): Secondary | ICD-10-CM

## 2023-10-15 DIAGNOSIS — L7682 Other postprocedural complications of skin and subcutaneous tissue: Secondary | ICD-10-CM

## 2023-10-15 DIAGNOSIS — Z9013 Acquired absence of bilateral breasts and nipples: Secondary | ICD-10-CM

## 2023-10-15 DIAGNOSIS — I890 Lymphedema, not elsewhere classified: Secondary | ICD-10-CM

## 2023-10-15 NOTE — Therapy (Signed)
 OUTPATIENT OCCUPATIONAL THERAPY LYMPHEDEMA TREATMENT NOTE  Patient Name: Eileen Hart MRN: [id number] DOB:1991/06/15, 33 y.o., female Today's Date: 10/15/2023  END OF SESSION:   OT End of Session - 10/15/23 1357     Visit Number 12    Number of Visits 18    Date for OT Re-Evaluation 10/30/23    Authorization Type [place] MEDICA[employer] PREPAID HEALTH PLAN    Progress Note Due on Visit 10    OT Start Time 1100    OT Stop Time 1145    OT Time Calculation (min) 45 min    Equipment Utilized During Treatment None    Activity Tolerance Patient tolerated treatment well    Behavior During Therapy WFL for tasks assessed/performed            Past Medical History:  Diagnosis Date   Chlamydia 01/25/2010   Depression with anxiety    Gestational hypertension    resolved after delivery   Immunization, viral disease    gardasil series completed   Invasive carcinoma of breast (HCC)    right   Tobacco abuse 05/02/2016   Past Surgical History:  Procedure Laterality Date   AUGMENTATION MAMMAPLASTY Bilateral    saline implants 2015   BREAST BIOPSY Right 10/14/2022   us  biopsy/ coil clip/ path pending   BREAST BIOPSY Right 10/14/2022   US  RT BREAST BX W LOC DEV 1ST LESION IMG BX SPEC US  GUIDE 10/14/2022 [facility]   BREAST BIOPSY Left 10/29/2022   Us  Core Bx Ribbon clip- path pending   BREAST BIOPSY Right 10/29/2022   US  Core 1130 Venus Clip- path pending   BREAST BIOPSY Right 10/29/2022   Us  Core Bx Retroareolar heart clip-path pending   BREAST BIOPSY Right 10/29/2022   Us  Core Bx Ribbon Clip path pending   BREAST BIOPSY Right 10/29/2022   US  RT BREAST BX W LOC DEV EA ADD LESION IMG BX SPEC US  GUIDE 10/29/2022 [facility]   BREAST BIOPSY Right 10/29/2022   US  RT BREAST BX W LOC DEV 1ST LESION IMG BX SPEC US  GUIDE 10/29/2022 [facility]   BREAST BIOPSY Right 10/29/2022   US  RT BREAST BX W LOC DEV EA ADD LESION IMG BX SPEC US  GUIDE 10/29/2022 [facility]   BREAST BIOPSY  Left 10/29/2022   US  LT BREAST BX W LOC DEV 1ST LESION IMG BX SPEC US  GUIDE 10/29/2022 [facility]   BREAST ENHANCEMENT SURGERY  2015   BREAST RECONSTRUCTION WITH PLACEMENT OF TISSUE EXPANDER AND FLEX HD (ACELLULAR HYDRATED DERMIS) Bilateral 05/08/2023   Procedure: Bilateral immediate breast reconstruction with expanders and Flex HD placement;  Surgeon: Thornell Flirt, DO;  Location: [facility];  Service: Plastics;  Laterality: Bilateral;   CESAREAN SECTION  2014   MASTECTOMY W/ SENTINEL NODE BIOPSY Bilateral 05/08/2023   Procedure: MASTECTOMY Simple WITH SENTINEL LYMPH NODE BIOPSY, RNFA to assist;  Surgeon: Alben Alma, MD;  Location: [facility];  Service: General;  Laterality: Bilateral;   PORT-A-CATH REMOVAL  04/2023   PORTACATH PLACEMENT N/A 10/24/2022   PortA Cath removed; Procedure: INSERTION PORT-A-CATH;  Surgeon: Alben Alma, MD;  Location: [facility];  Service: General;  Laterality: N/A;   REMOVAL OF BILATERAL TISSUE EXPANDERS WITH PLACEMENT OF BILATERAL BREAST IMPLANTS Bilateral 09/08/2023   Procedure: REMOVAL, TISSUE EXPANDER, BREAST, BILATERAL, WITH BILATERAL IMPLANT IMPLANT INSERTION;  Surgeon: Thornell Flirt, DO;  Location: [facility];  Service: Plastics;  Laterality: Bilateral;   TONSILLECTOMY     Patient Active Problem List   Diagnosis Date Noted  S/P bilateral mastectomy 05/08/2023   Genetic testing 10/30/2022   Invasive carcinoma of breast (HCC) 10/22/2022   Malignant neoplasm of upper-outer quadrant of right breast in female, estrogen receptor negative (HCC) 10/22/2022   Lump of right breast 09/18/2022   Weight gain 12/04/2020   Right wrist pain 09/27/2019   Contraceptive management 07/07/2019   Anxiety and depression 12/29/2015   PCP: Dr. Lovetta Rucks (Pt reports this MD just left the practice, but she will continue with this practice with another MD)  REFERRING PROVIDER: Perrin Brakeman, J, PA  REFERRING DIAG:  [phone] (ICD-10-CM) - Malignant neoplasm of  upper-outer quadrant of right female breast  Z17.1 (ICD-10-CM) - Estrogen receptor negative status (ER-)  C50.919 (ICD-10-CM) - Malignant neoplasm of unspecified site of unspecified female breast  Z98.890 (ICD-10-CM) - Other specified postprocedural states   THERAPY DIAG:  Muscle weakness (generalized)  Axillary web syndrome  S/P bilateral mastectomy  Lymphedema of right arm  Rationale for Evaluation and Treatment: Rehabilitation  ONSET DATE: Feb 1st 2025  SUBJECTIVE:                                                                                                                                                                                          SUBJECTIVE STATEMENT: Pt reports some wrist and forearm discomfort after an episode of a quick stretch last week, and dorsal forearm tenderness, with pt questioning whether this may be coming from the wrist pain or the compression sleeve. Accompanied by: self and mom  PERTINENT HISTORY: Per MEDICAL RECORD NUMBER Pt diagnosed with malignant neoplasm of upper-outer quadrant of right breast in female, estrogen receptor negative (HCC) 10/22/2022.  Pt is s/p bilat mastectomy and reconstruction with expanders in place 05/08/2023. Currently receiving p.o. chemo therapy with Xeloda , which she will finish early June.  PAIN: 10/15/23: R dorsal forearm and R dorsal/radial wrist 5/10 with activity  Are you having pain? None at rest; pain present with activity  NPRS scale: 5/10 Pain location: R volar arm from the shoulder to the wrist  Pain orientation: Right  PAIN TYPE: sharp, tight Pain description: intermittent/with activity Aggravating factors: extending the elbow/reaching into flexion and abd Relieving factors: rest   PRECAUTIONS: Lymphedema precautions: avoid needle sticks/BPs to RUE.  Pt is currently limiting herself to lifting no more than 40# at work.  Pt is currently undergoing chemotherapy p.o.  RED FLAGS: None   WEIGHT BEARING  RESTRICTIONS: No  FALLS:  Has patient fallen in last 6 months? 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  OBJECTIVE: Note: Objective measures were completed at Evaluation unless otherwise noted.  COGNITION: Overall cognitive status: Within functional limits for tasks assessed   PALPATION: Mild cording R volar forearm, extending to upper arm and axilla.   OBSERVATIONS / OTHER ASSESSMENTS:  RUE: No visible swelling, but tenderness with palpation at the biceps and volar forearm.  Skin is taught throughout the RUE from the upper arm to the distal forearm; non-pitting.  Mild cording R volar forearm, extending to upper arm and axilla; LUE: non-tender, supple  SENSATION: reports tingling in the ulnar nerve distribution of RUE only with prolonged positioning of elbow in flexion (when playing video games or holding phone).  POSTURE: No significant postural limitations  UPPER EXTREMITY AROM/PROM: (Per PT eval on 08/07/23)      09/18/23: Will remeasure RUE AROM next week to allow an additional week for incision healing  AROM RIGHT   eval  Right 09/22/23  Shoulder extension    Shoulder flexion 135 142  Shoulder abduction 119 149  Shoulder internal rotation Observed WNL WNL  Shoulder external rotation 65 90  Elbow extension -10 0    (Blank rows = not tested)  A/PROM LEFT   eval LEFT 09/22/23  Shoulder extension    Shoulder flexion 150 144  Shoulder abduction 137 180  Shoulder internal rotation Observed WNL WNL  Shoulder external rotation 60 83  Elbow extension 0 0    (Blank rows = not tested)  UPPER EXTREMITY STRENGTH:  RUE grossly 4/5, LUE grossly 4+/5 (Per PT eval on 08/07/23)  LYMPHEDEMA ASSESSMENTS:   SURGERY TYPE/DATE: 05/08/2023 bilat mastectomy with reconstruction   NUMBER OF LYMPH NODES REMOVED: SLNB: 2 axillary lymph nodes removed   CHEMOTHERAPY: 16 rounds ending early/mid Oct 2024, now has a chemo pill 2x/day for 14 days, 1 week off for 8 cycles, ending in June  RADIATION: None  HORMONE TREATMENT: None  INFECTIONS: None  LYMPHEDEMA ASSESSMENTS:   UE circumferential measurements in cm  [place]mark R Date: 08/25/23 L Date: 08/25/23 R Date: R Date:  1  MP (distal base of SF) 18.7 17.9    2  Wrist crease 15.5 15.0    3 Forearm (*) +4 cm 15.0 14.5    4 * +4 cm 17.3 16.4    5 * +4 cm 20.0 18.8    6 * +4 cm 21.9 21.5    7 * +4 cm 22.8 22.7    8  Elbow crease 22.2 22.7    9 Upper arm (**) +4 cm 22.0 21.8    10 ** +4 cm 22.2 22.5    11 ** +4 cm 23.7 23.8    12 ** +4 cm 25.4 25.5                      Total circumferential cm 246.7 cm 243.1 cm      Total Limb  differential 3.6 cm  (R 1.5% larger than L; all  points of measure are <2 cm differential throughout RUE/LUE)        Change from initial          TODAY'S TREATMENT:  DATE: 10/15/23 Manual Therapy: -Soft tissue massage proximal>distal to target cording on volar side of RUE. -Alternated RUE position between "T," "W," stretch positions and abd at 45* and 60*during soft tissue massage  -Reviewed self MLD techniques/routing -To further target RUE cording: Performed MLD to the RUE, working proximal>distal>proximal, routing lymph fluid across chest toward L axilla; gentle pressure across the chest and while clearing R/L axillary nodes and abdomen to avoid any discomfort at surgical incisions beneath B breasts.   -edema massage at the R dorsal and radial aspect of wrist and hand  -application of Kinesiotape to target  mild swelling to dorsal hand and wrist  Therapeutic Exercise: -"T" stretch-hold x3 min (R arm only) -"W" stretch-hold x3 min (R arm only) -AAROM for R shoulder abd, ER with prolonged stretch with gentle proximal traction at the R upper arm  Self Care: -Condition management for new mild swelling and pain at the R dorsal wrist and hand from recent flare up (old injury): recommendation for ice application 15-20 min to R dorsal hand/wrist, reviewed Ktape application technique to promote edema reduction, or use of compression glove (issued in previous visit) as needed, benefits of R wrist AROM all planes within pain free range  PATIENT EDUCATION:  Education details: 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  Pt will follow up again in ~3 weeks with OT in that setting and will continue to wear her compression sleeve 6-8 hours daily for the next 3 weeks.  Reported flare up of R radial and dorsal hand/wrist pain from an old injury after a quick stretch at the wrist last week.  Very mild swelling present in the dorsal/radial aspect of the wrist and hand.  Pt receptive to all edema and pain management strategies to address this acute pain in the hand.  Lymphatic cording continues to improve.  Pt tolerated all manual therapy and therapeutic exercises well this date.  Applied Ktape to address mild swelling in R wrist/hand as noted above, reviewed self application techniques for carry over at home, and issued 2 additional pieces of tape precut for this area.  Pt will benefit from continued OT to work towards goals in [employer], focusing on reducing cording with therapeutic exercises and manual  therapy.  OBJECTIVE IMPAIRMENTS: decreased knowledge of condition, decreased knowledge of use of DME, decreased ROM, decreased strength, increased edema, increased fascial restrictions, impaired flexibility, impaired sensation, impaired UE functional use, and pain.   ACTIVITY LIMITATIONS: carrying, lifting, dressing, reach over head, and caring for others  PARTICIPATION LIMITATIONS: occupation  PERSONAL FACTORS: 1-2 comorbidities: bilat mastectomy, reconstruction  are also affecting patient's functional outcome.   REHAB POTENTIAL: Excellent  CLINICAL DECISION MAKING: Evolving/moderate complexity  EVALUATION COMPLEXITY: Moderate  GOALS: Goals reviewed with patient? Yes  SHORT TERM GOALS: Target date: 09/15/23   1.  Pt will be indep to verbalize 2-3 lymphedema precautions to prevent infection and lymphedema flare ups. Baseline: Eval: Initiated educ on optimal skin care; further training needed; 09/17/33: indep with precautions Goal status: achieved  2.  Pt will be indep to verbalize 1-2 signs of lymphedema flare ups and identify subsequent action steps. Baseline: Eval: Educ not yet initiated; 09/18/23: indep with s/s of flare ups and action steps Goal status:  achieved  LONG TERM GOALS: Target date: 10/30/23  Pt will be indep to perform HEP for reducing R AWS and promoting RUE lymphatic drainage with use of visual handouts. Baseline: Eval: HEP not yet initiated; 09/18/23: HEP has been on hold over the last 2 weeks since pt's sx; pt has been given the ok to restart exercises this week to her tolerance; will assess independence with HEP next week Goal status: ongoing  2.  Pt will demonstrate self MLD technique with 75% accuracy using visual handouts as needed to promote lymphatic drainage throughout the RUE. Baseline: Eval: Instruction not yet initiated; 09/18/23: Limited BUE ROM post breast implant sx last week, so self MLD not yet attempted.  Pt requires min-mod vc for sequencing when  reviewing verbally Goal status: ongoing  3.  Pt will demonstrate soft tissue massage technique with 75% accuracy using visual/written handouts as needed to reduce/release R AWS in the  RUE. Baseline: Eval: Not yet initiated; 09/18/23: indep with technique but currently limited with ability to perform self massage d/t limited ROM post breast sx last week, and family not consistently available to perform.   Goal status: ongoing  4.  Pt will demonstrate independence with donning/doffing class 1 compression sleeve and gauntlet to the RUE. Baseline: Eval: Not yet obtained; 09/18/23: Pt planning to pick up her sleeve today Goal status: ongoing  5.  Pt will demonstrate 75% or better adherence to recommended wearing schedule for compression sleeve/glove. Baseline: Eval: Not yet obtained/wearing schedule not yet reviewed; 09/18/23: Defer; Pt will be picking up sleeve today Goal status: ongoing   PLAN:  OT FREQUENCY: 2x/week  OT DURATION: 6 weeks  PLANNED INTERVENTIONS: 16109- OT Re-Evaluation, 97110-Therapeutic exercises, 97530- Therapeutic activity, 97535- Self Care, 60454- Manual therapy, 97760- Orthotic Fit/training, Patient/Family education, Joint mobilization, Manual lymph drainage, Scar mobilization, Compression bandaging, DME instructions, Cryotherapy, and Moist heat  PLAN FOR NEXT SESSION: manual therapy, review AWS HEP    Casandra Claw, OTR/L,CLT 10/15/2023, 2:00 PM

## 2023-10-16 NOTE — Progress Notes (Signed)
 Patient is a 33 year old female with history of breast cancer status post breast reconstruction.  She most recently underwent bilateral exchange of tissue expanders for implants and bilateral capsulotomies for implant repositioning with Dr. Orin Birk on 09/08/2023.  Patient is a little over 5 weeks postop.  She presents to the clinic today for postoperative follow-up.  Patient was last seen in the clinic on 09/30/2023.  At this visit, patient reported she is extremely pleased with her results.  There is no sign of infection or seroma.  There was a little bit of rippling noted.  The possibility of fat grafting was discussed.  Today, patient reports she is doing well.  She states that she noticed a small area of firmness to her right lateral breast which feels tight when she is lifting her arm.  She states that she is going to OT and PT at this time.  She denies any other issues or concerns at this time.  Denies any fevers or chills.  Denies any drainage from either of her breast.  Chaperone present on exam.  On exam, patient is sitting upright in no acute distress.  Implants are in place bilaterally and are soft.  There is no overlying erythema.  No obvious fluid collections on exam.  NAC's appear to be healthy.  Incisions are intact and well-healed.  There is a small area of firmness noted to the right lateral breast.  This appears to be consistent with some scar tissue.  There are no overlying skin changes.  There are no signs of infection on exam.  Discussed with patient that the small area of firmness is most likely scar tissue.  I discussed with her I do like her to closely monitor it, and if it becomes larger, firmer or more bothersome she needs to let us  know right away.  Patient expressed understanding.  In the meantime, would like the patient to massage it twice a day.  Also recommended that she continue with stretching and exercises from PT and OT.  Patient expressed understanding.  Discussed with  patient that she may begin applying scar creams to her incisions if she would like.  Patient expressed understanding.  Discussed with patient that as of next week, she no longer has to wear compression bra and may wear regular bra with no underwire.  Also discussed with her that she may start gradually increasing her activities.  Patient expressed understanding.  We will see the patient back in about 2 to 3 months to check on the area of firmness to her right breast.  I discussed with her to call in the meantime if she has any questions or concerns about anything.

## 2023-10-17 ENCOUNTER — Ambulatory Visit: Admitting: Student

## 2023-10-17 ENCOUNTER — Encounter: Payer: Self-pay | Admitting: Student

## 2023-10-17 VITALS — BP 116/75 | HR 83 | Ht 64.0 in | Wt 115.4 lb

## 2023-10-17 DIAGNOSIS — Z9889 Other specified postprocedural states: Secondary | ICD-10-CM

## 2023-10-20 ENCOUNTER — Ambulatory Visit

## 2023-10-20 DIAGNOSIS — Z9013 Acquired absence of bilateral breasts and nipples: Secondary | ICD-10-CM

## 2023-10-20 DIAGNOSIS — L905 Scar conditions and fibrosis of skin: Secondary | ICD-10-CM

## 2023-10-20 DIAGNOSIS — I890 Lymphedema, not elsewhere classified: Secondary | ICD-10-CM

## 2023-10-20 DIAGNOSIS — L7682 Other postprocedural complications of skin and subcutaneous tissue: Secondary | ICD-10-CM | POA: Diagnosis not present

## 2023-10-20 NOTE — Therapy (Signed)
 OUTPATIENT OCCUPATIONAL THERAPY LYMPHEDEMA TREATMENT NOTE  Patient Name: Eileen Hart MRN: [id number] DOB:1991/02/10, 33 y.o., female Today's Date: 10/20/2023  END OF SESSION:   OT End of Session - 10/20/23 1324     Visit Number 13    Number of Visits 18    Date for OT Re-Evaluation 10/30/23    Authorization Type [place] MEDICA[employer]PA[employer] PLAN    Progress Note Due on Visit 10    OT Start Time 0845    OT Stop Time 0930    OT Time Calculation (min) 45 min    Equipment Utilized During Treatment None    Activity Tolerance Patient tolerated treatment well    Behavior During Therapy WFL for tasks assessed/performed            Past Medical History:  Diagnosis Date   Chlamydia 01/25/2010   Depression with anxiety    Gestational hypertension    resolved after delivery   Immunization, viral disease    gardasil series completed   Invasive carcinoma of breast (HCC)    right   Tobacco abuse 05/02/2016   Past Surgical History:  Procedure Laterality Date   AUGMENTATION MAMMAPLASTY Bilateral    saline implants 2015   BREAST BIOPSY Right 10/14/2022   us  biopsy/ coil clip/ path pending   BREAST BIOPSY Right 10/14/2022   US  RT BREAST BX W LOC DEV 1ST LESION IMG BX SPEC US  GUIDE 10/14/2022 [facility]   BREAST BIOPSY Left 10/29/2022   Us  Core Bx Ribbon clip- path pending   BREAST BIOPSY Right 10/29/2022   US  Core 1130 Venus Clip- path pending   BREAST BIOPSY Right 10/29/2022   Us  Core Bx Retroareolar heart clip-path pending   BREAST BIOPSY Right 10/29/2022   Us  Core Bx Ribbon Clip path pending   BREAST BIOPSY Right 10/29/2022   US  RT BREAST BX W LOC DEV EA ADD LESION IMG BX SPEC US  GUIDE 10/29/2022 [facility]   BREAST BIOPSY Right 10/29/2022   US  RT BREAST BX W LOC DEV 1ST LESION IMG BX SPEC US  GUIDE 10/29/2022 [facility]   BREAST BIOPSY Right 10/29/2022   US  RT BREAST BX W LOC DEV EA ADD LESION IMG BX SPEC US  GUIDE 10/29/2022 [facility]   BREAST BIOPSY  Left 10/29/2022   US  LT BREAST BX W LOC DEV 1ST LESION IMG BX SPEC US  GUIDE 10/29/2022 [facility]   BREAST ENHANCEMENT SURGERY  2015   BREAST RECONSTRUCTION WITH PLACEMENT OF TISSUE EXPANDER AND FLEX HD (ACELLULAR HYDRATED DERMIS) Bilateral 05/08/2023   Procedure: Bilateral immediate breast reconstruction with expanders and Flex HD placement;  Surgeon: Thornell Flirt, DO;  Location: [facility];  Service: Plastics;  Laterality: Bilateral;   CESAREAN SECTION  2014   MASTECTOMY W/ SENTINEL NODE BIOPSY Bilateral 05/08/2023   Procedure: MASTECTOMY Simple WITH SENTINEL LYMPH NODE BIOPSY, RNFA to assist;  Surgeon: Alben Alma, MD;  Location: [facility];  Service: General;  Laterality: Bilateral;   PORT-A-CATH REMOVAL  04/2023   PORTACATH PLACEMENT N/A 10/24/2022   PortA Cath removed; Procedure: INSERTION PORT-A-CATH;  Surgeon: Alben Alma, MD;  Location: [facility];  Service: General;  Laterality: N/A;   REMOVAL OF BILATERAL TISSUE EXPANDERS WITH PLACEMENT OF BILATERAL BREAST IMPLANTS Bilateral 09/08/2023   Procedure: REMOVAL, TISSUE EXPANDER, BREAST, BILATERAL, WITH BILATERAL IMPLANT IMPLANT INSERTION;  Surgeon: Thornell Flirt, DO;  Location: [facility];  Service: Plastics;  Laterality: Bilateral;   TONSILLECTOMY     Patient Active Problem List   Diagnosis Date Noted  S/P bilateral mastectomy 05/08/2023   Genetic testing 10/30/2022   Invasive carcinoma of breast (HCC) 10/22/2022   Malignant neoplasm of upper-outer quadrant of right breast in female, estrogen receptor negative (HCC) 10/22/2022   Lump of right breast 09/18/2022   Weight gain 12/04/2020   Right wrist pain 09/27/2019   Contraceptive management 07/07/2019   Anxiety and depression 12/29/2015   PCP: Dr. Lovetta Rucks (Pt reports this MD just left the practice, but she will continue with this practice with another MD)  REFERRING PROVIDER: Perrin Brakeman, J, PA  REFERRING DIAG:  [phone] (ICD-10-CM) - Malignant neoplasm of  upper-outer quadrant of right female breast  Z17.1 (ICD-10-CM) - Estrogen receptor negative status (ER-)  C50.919 (ICD-10-CM) - Malignant neoplasm of unspecified site of unspecified female breast  Z98.890 (ICD-10-CM) - Other specified postprocedural states   THERAPY DIAG:  Axillary web syndrome  S/P bilateral mastectomy  Lymphedema of right arm  Rationale for Evaluation and Treatment: Rehabilitation  ONSET DATE: Feb 1st 2025  SUBJECTIVE:                                                                                                                                                                                          SUBJECTIVE STATEMENT: Pt reports her wrist is still a bit sore with flexion, but improved compared to last week. Accompanied by: self and mom  PERTINENT HISTORY: Per MEDICAL RECORD NUMBER Pt diagnosed with malignant neoplasm of upper-outer quadrant of right breast in female, estrogen receptor negative (HCC) 10/22/2022.  Pt is s/p bilat mastectomy and reconstruction with expanders in place 05/08/2023. Currently receiving p.o. chemo therapy with Xeloda , which she will finish early June.  PAIN: 10/20/23: R dorsal/radial wrist 4-5/10 with activity  Are you having pain? None at rest; pain present with activity  NPRS scale: 5/10 Pain location: R volar arm from the shoulder to the wrist  Pain orientation: Right  PAIN TYPE: sharp, tight Pain description: intermittent/with activity Aggravating factors: extending the elbow/reaching into flexion and abd Relieving factors: rest   PRECAUTIONS: Lymphedema precautions: avoid needle sticks/BPs to RUE.  Pt is currently limiting herself to lifting no more than 40# at work.  Pt is currently undergoing chemotherapy p.o.  RED FLAGS: None   WEIGHT BEARING RESTRICTIONS: No  FALLS:  Has patient fallen in last 6 months? 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  OBJECTIVE: Note: Objective measures were completed at Evaluation unless otherwise noted.  COGNITION: Overall cognitive status: Within functional limits for tasks assessed   PALPATION: Mild cording R volar forearm, extending to upper arm and axilla.   OBSERVATIONS / OTHER ASSESSMENTS:  RUE: No visible swelling, but tenderness with palpation at the biceps and volar forearm.  Skin is taught throughout the RUE from the upper arm to the distal forearm; non-pitting.  Mild cording R volar forearm, extending to upper arm and axilla; LUE: non-tender, supple  SENSATION: reports tingling in the ulnar nerve distribution of RUE only with prolonged positioning of elbow in flexion (when playing video games or holding phone).  POSTURE: No significant postural limitations  UPPER EXTREMITY AROM/PROM: (Per PT eval on 08/07/23)      09/18/23: Will remeasure RUE AROM next week to allow an additional week for incision healing  AROM RIGHT   eval  Right 09/22/23  Shoulder extension    Shoulder flexion 135 142  Shoulder abduction 119 149  Shoulder internal rotation Observed WNL WNL  Shoulder external rotation 65 90  Elbow extension -10 0    (Blank rows = not tested)  A/PROM LEFT   eval LEFT 09/22/23  Shoulder extension    Shoulder flexion 150 144  Shoulder abduction 137 180  Shoulder internal rotation Observed WNL WNL  Shoulder external rotation 60 83  Elbow extension 0 0    (Blank rows = not tested)  UPPER EXTREMITY STRENGTH: RUE grossly 4/5, LUE grossly 4+/5 (Per PT eval on 08/07/23)  LYMPHEDEMA ASSESSMENTS:   SURGERY TYPE/DATE: 05/08/2023 bilat mastectomy with reconstruction   NUMBER OF LYMPH  NODES REMOVED: SLNB: 2 axillary lymph nodes removed   CHEMOTHERAPY: 16 rounds ending early/mid Oct 2024, now has a chemo pill 2x/day for 14 days, 1 week off for 8 cycles, ending in June  RADIATION: None  HORMONE TREATMENT: None  INFECTIONS: None  LYMPHEDEMA ASSESSMENTS:   UE circumferential measurements in cm  Landmark R Date: 08/25/23 L Date: 08/25/23 R Date: R Date:  1  MP (distal base of SF) 18.7 17.9    2  Wrist crease 15.5 15.0    3 Forearm (*) +4 cm 15.0 14.5    4 * +4 cm 17.3 16.4    5 * +4 cm 20.0 18.8    6 * +4 cm 21.9 21.5    7 * +4 cm 22.8 22.7    8  Elbow crease 22.2 22.7    9 Upper arm (**) +4 cm 22.0 21.8    10 ** +4 cm 22.2 22.5    11 ** +4 cm 23.7 23.8    12 ** +4 cm 25.4 25.5                      Total circumferential cm 246.7 cm 243.1 cm      Total Limb  differential 3.6 cm  (R 1.5% larger than L; all  points of measure are <2 cm differential throughout RUE/LUE)        Change from initial          TODAY'S TREATMENT:  DATE: 10/20/23 Manual Therapy: -Soft tissue massage proximal>distal to target cording on volar side of RUE. -Alternated RUE position between "T," "W," stretch positions and abd at 45* and 60*during soft tissue massage  -To further target RUE cording: Performed MLD to the RUE, working proximal>distal>proximal, routing lymph fluid across chest toward L axilla; gentle pressure across the chest and while clearing R/L axillary nodes and abdomen to avoid any discomfort at surgical incisions beneath B breasts.   -Edema massage at the R dorsal and radial aspect of wrist and hand -Scar massage performed at healed incision inferior to R breast, and just superior to incision where scar tissue is forming; scar massage technique reviewed with pt   Therapeutic Exercise: -PROM for R/L shoulder abd, flex, ER with prolonged stretch with  gentle proximal traction at the R/L upper arm/pec -Gentle passive wrist flex/ext, RD/UD, grade 1 and 2 joint mobs for all wrist planes for pain reduction  PATIENT EDUCATION:  Education details: scar massage technique Person educated: Patient Education method: Programmer, multimedia, demonstration, vc Education comprehension: verbalized understanding  HOME EXERCISE PROGRAM: AWS exercises  ASSESSMENT: CLINICAL IMPRESSION: Pt reports R wrist pain is slightly improved as compared to last week, but still present most notably when removing compression sleeve and flexing the R wrist.  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  Very mild swelling still present at the dorsal hand, radial aspect, web space between thumb and IF, extending to radial wrist.  Pt felt minimal improvement from Ktape last week to target this swelling, so this was not applied today.  Good tolerance to manual therapy and therapeutic exercises noted above.  Pt reports continued improvement in BUE flexibility (this also demonstrated today), most noticeable with improved ease with UB ADLs.  L shoulder flex, abd, and ER still somewhat stiff, with pt reporting some pec tightness, also palpable, but pt is steadily approaching return to baseline ROM on both sides.  RUE cording continues to improve.  Good tolerance to scar massage at and superior to healed incision inferior to R breast.  Pt saw plastic surgeon for follow up last week who encouraged pt to begin scar massage 2x daily to this area d/t palpable scar tissue forming superior to R surgical incision beneath R breast.  Pt will benefit from continued OT to work towards goals in [employer], focusing on reducing cording with therapeutic exercises and manual therapy, working to minimize pain and swelling in the RUE, increase BUE flexibility, and mobilizing scar tissue at and around surgical incisions inferior to B  breasts.   OBJECTIVE IMPAIRMENTS: decreased knowledge of condition, decreased knowledge of use of DME, decreased ROM, decreased strength, increased edema, increased fascial restrictions, impaired flexibility, impaired sensation, impaired UE functional use, and pain.   ACTIVITY LIMITATIONS: carrying, lifting, dressing, reach over head, and caring for others  PARTICIPATION LIMITATIONS: occupation  PERSONAL FACTORS: 1-2 comorbidities: bilat mastectomy, reconstruction  are also affecting patient's functional outcome.   REHAB POTENTIAL: Excellent  CLINICAL DECISION MAKING: Evolving/moderate complexity  EVALUATION COMPLEXITY: Moderate  GOALS: Goals reviewed with patient? Yes  SHORT TERM GOALS: Target date: 09/15/23   1.  Pt will be indep to verbalize 2-3 lymphedema precautions to prevent infection and lymphedema flare ups. Baseline: Eval: Initiated educ on optimal skin care; further training needed; 09/17/33: indep with precautions Goal status: achieved  2.  Pt will be indep to verbalize 1-2 signs of lymphedema flare  ups and identify subsequent action steps. Baseline: Eval: Educ not yet initiated; 09/18/23: indep with s/s of flare ups and action steps Goal status: achieved  LONG TERM GOALS: Target date: 10/30/23  Pt will be indep to perform HEP for reducing R AWS and promoting RUE lymphatic drainage with use of visual handouts. Baseline: Eval: HEP not yet initiated; 09/18/23: HEP has been on hold over the last 2 weeks since pt's sx; pt has been given the ok to restart exercises this week to her tolerance; will assess independence with HEP next week Goal status: ongoing  2.  Pt will demonstrate self MLD technique with 75% accuracy using visual handouts as needed to promote lymphatic drainage throughout the RUE. Baseline: Eval: Instruction not yet initiated; 09/18/23: Limited BUE ROM post breast implant sx last week, so self MLD not yet attempted.  Pt requires min-mod vc for sequencing when  reviewing verbally Goal status: ongoing  3.  Pt will demonstrate soft tissue massage technique with 75% accuracy using visual/written handouts as needed to reduce/release R AWS in the  RUE. Baseline: Eval: Not yet initiated; 09/18/23: indep with technique but currently limited with ability to perform self massage d/t limited ROM post breast sx last week, and family not consistently available to perform.   Goal status: ongoing  4.  Pt will demonstrate independence with donning/doffing class 1 compression sleeve and gauntlet to the RUE. Baseline: Eval: Not yet obtained; 09/18/23: Pt planning to pick up her sleeve today Goal status: ongoing  5.  Pt will demonstrate 75% or better adherence to recommended wearing schedule for compression sleeve/glove. Baseline: Eval: Not yet obtained/wearing schedule not yet reviewed; 09/18/23: Defer; Pt will be picking up sleeve today Goal status: ongoing   PLAN:  OT FREQUENCY: 2x/week  OT DURATION: 6 weeks  PLANNED INTERVENTIONS: [address]- OT Re-Evaluation, 97110-Therapeutic exercises, 97530- Therapeutic activity, 97535- Self Care, 60454- Manual therapy, 97760- Orthotic Fit/training, Patient/Family education, Joint mobilization, Manual lymph drainage, Scar mobilization, Compression bandaging, DME instructions, Cryotherapy, and Moist heat  PLAN FOR NEXT SESSION: manual therapy, review AWS HEP    Casandra Claw, OTR/L,CLT 10/20/2023, 1:25 PM

## 2023-10-21 ENCOUNTER — Other Ambulatory Visit (HOSPITAL_COMMUNITY): Payer: Self-pay

## 2023-10-21 ENCOUNTER — Other Ambulatory Visit: Payer: Self-pay | Admitting: Oncology

## 2023-10-21 ENCOUNTER — Other Ambulatory Visit: Payer: Self-pay

## 2023-10-21 ENCOUNTER — Other Ambulatory Visit: Payer: Self-pay | Admitting: Pharmacy Technician

## 2023-10-21 DIAGNOSIS — Z171 Estrogen receptor negative status [ER-]: Secondary | ICD-10-CM

## 2023-10-21 MED ORDER — CAPECITABINE 500 MG PO TABS
1500.0000 mg | ORAL_TABLET | Freq: Two times a day (BID) | ORAL | 1 refills | Status: DC
  Filled 2023-10-21: qty 84, 21d supply, fill #0
  Filled 2023-11-19: qty 84, 21d supply, fill #1

## 2023-10-21 NOTE — Progress Notes (Signed)
 Specialty Pharmacy Refill Coordination Note  JAMA ANKRUM is a 33 y.o. female contacted today regarding refills of specialty medication(s) Capecitabine  (XELODA )   Patient requested Delivery   Delivery date: 10/30/23 (Pending due to MD refill request.)   Verified address: [address] [place] [address]   Medication will be filled on 10/29/23.   This fill date is pending response to refill request from provider. Patient is aware and if they have not received fill by intended date they must follow up with pharmacy.

## 2023-10-23 ENCOUNTER — Ambulatory Visit

## 2023-10-27 ENCOUNTER — Inpatient Hospital Stay: Attending: Oncology | Admitting: *Deleted

## 2023-10-27 DIAGNOSIS — C50411 Malignant neoplasm of upper-outer quadrant of right female breast: Secondary | ICD-10-CM | POA: Insufficient documentation

## 2023-10-27 DIAGNOSIS — Z9189 Other specified personal risk factors, not elsewhere classified: Secondary | ICD-10-CM

## 2023-10-27 NOTE — Progress Notes (Signed)
 SUBJECTIVE: Pt returns for her 3 month L-Dex screen.    PAIN:  Are you having pain? No   SOZO SCREENING: Patient was assessed today using the SOZO machine to determine the lymphedema index score. This was compared to her baseline score. It was determined that she is within the recommended range when compared to her baseline and no further action is needed at this time. She will continue SOZO screenings. These are done every 3 months for 2 years post operatively followed by every 6 months for 2 years, and then annually.     L-DEX FLOWSHEETS                L-DEX LYMPHEDEMA SCREENING    Measurement Type Unilateral     L-DEX MEASUREMENT EXTREMITY Upper Extremity     POSITION  Standing     DOMINANT SIDE Right     At Risk Side Right    BASELINE SCORE (UNILATERAL) -0.7    L-DEX SCORE (UNILATERAL) 0.1    VALUE CHANGE (UNILAT) 0.8

## 2023-10-29 ENCOUNTER — Other Ambulatory Visit: Payer: Self-pay

## 2023-10-29 ENCOUNTER — Ambulatory Visit: Admitting: Physical Therapy

## 2023-10-29 ENCOUNTER — Ambulatory Visit: Attending: Surgical

## 2023-10-29 DIAGNOSIS — I890 Lymphedema, not elsewhere classified: Secondary | ICD-10-CM

## 2023-10-29 DIAGNOSIS — L905 Scar conditions and fibrosis of skin: Secondary | ICD-10-CM | POA: Diagnosis present

## 2023-10-29 DIAGNOSIS — L7682 Other postprocedural complications of skin and subcutaneous tissue: Secondary | ICD-10-CM

## 2023-10-29 DIAGNOSIS — Z9013 Acquired absence of bilateral breasts and nipples: Secondary | ICD-10-CM

## 2023-10-29 DIAGNOSIS — M6281 Muscle weakness (generalized): Secondary | ICD-10-CM

## 2023-10-29 NOTE — Therapy (Signed)
 OUTPATIENT OCCUPATIONAL THERAPY LYMPHEDEMA DISCHARGE NOTE  Patient Name: Eileen Hart MRN: [id number] DOB:01/14/1991, 33 y.o., female Today's Date: 10/31/2023  END OF SESSION:   OT End of Session - 10/31/23 0833     Visit Number 15    Number of Visits 18    Date for OT Re-Evaluation 10/30/23    Authorization Type [place] [employer]ICA[employer] PLAN    Progress Note Due on Visit 10    OT Start Time 1102    OT Stop Time 1130    OT Time Calculation (min) 28 min    Equipment Utilized During Treatment None    Activity Tolerance Patient tolerated treatment well    Behavior During Therapy WFL for tasks assessed/performed            Past Medical History:  Diagnosis Date   Chlamydia 01/25/2010   Depression with anxiety    Gestational hypertension    resolved after delivery   Immunization, viral disease    gardasil series completed   Invasive carcinoma of breast (HCC)    right   Tobacco abuse 05/02/2016   Past Surgical History:  Procedure Laterality Date   AUGMENTATION MAMMAPLASTY Bilateral    saline implants 2015   BREAST BIOPSY Right 10/14/2022   us  biopsy/ coil clip/ path pending   BREAST BIOPSY Right 10/14/2022   US  RT BREAST BX W LOC DEV 1ST LESION IMG BX SPEC US  GUIDE 10/14/2022 [facility]   BREAST BIOPSY Left 10/29/2022   Us  Core Bx Ribbon clip- path pending   BREAST BIOPSY Right 10/29/2022   US  Core 1130 Venus Clip- path pending   BREAST BIOPSY Right 10/29/2022   Us  Core Bx Retroareolar heart clip-path pending   BREAST BIOPSY Right 10/29/2022   Us  Core Bx Ribbon Clip path pending   BREAST BIOPSY Right 10/29/2022   US  RT BREAST BX W LOC DEV EA ADD LESION IMG BX SPEC US  GUIDE 10/29/2022 [facility]   BREAST BIOPSY Right 10/29/2022   US  RT BREAST BX W LOC DEV 1ST LESION IMG BX SPEC US  GUIDE 10/29/2022 [facility]   BREAST BIOPSY Right 10/29/2022   US  RT BREAST BX W LOC DEV EA ADD LESION IMG BX SPEC US  GUIDE 10/29/2022 [facility]   BREAST BIOPSY Left  10/29/2022   US  LT BREAST BX W LOC DEV 1ST LESION IMG BX SPEC US  GUIDE 10/29/2022 [facility]   BREAST ENHANCEMENT SURGERY  2015   BREAST RECONSTRUCTION WITH PLACEMENT OF TISSUE EXPANDER AND FLEX HD (ACELLULAR HYDRATED DERMIS) Bilateral 05/08/2023   Procedure: Bilateral immediate breast reconstruction with expanders and Flex HD placement;  Surgeon: Thornell Flirt, DO;  Location: [facility];  Service: Plastics;  Laterality: Bilateral;   CESAREAN SECTION  2014   MASTECTOMY W/ SENTINEL NODE BIOPSY Bilateral 05/08/2023   Procedure: MASTECTOMY Simple WITH SENTINEL LYMPH NODE BIOPSY, RNFA to assist;  Surgeon: Alben Alma, MD;  Location: [facility];  Service: General;  Laterality: Bilateral;   PORT-A-CATH REMOVAL  04/2023   PORTACATH PLACEMENT N/A 10/24/2022   PortA Cath removed; Procedure: INSERTION PORT-A-CATH;  Surgeon: Alben Alma, MD;  Location: [facility];  Service: General;  Laterality: N/A;   REMOVAL OF BILATERAL TISSUE EXPANDERS WITH PLACEMENT OF BILATERAL BREAST IMPLANTS Bilateral 09/08/2023   Procedure: REMOVAL, TISSUE EXPANDER, BREAST, BILATERAL, WITH BILATERAL IMPLANT IMPLANT INSERTION;  Surgeon: Thornell Flirt, DO;  Location: [facility];  Service: Plastics;  Laterality: Bilateral;   TONSILLECTOMY     Patient Active Problem List   Diagnosis Date Noted  S/P bilateral mastectomy 05/08/2023   Genetic testing 10/30/2022   Invasive carcinoma of breast (HCC) 10/22/2022   Malignant neoplasm of upper-outer quadrant of right breast in female, estrogen receptor negative (HCC) 10/22/2022   Lump of right breast 09/18/2022   Weight gain 12/04/2020   Right wrist pain 09/27/2019   Contraceptive management 07/07/2019   Anxiety and depression 12/29/2015   PCP: Dr. Lovetta Rucks (Pt reports this MD just left the practice, but she will continue with this practice with another MD)  REFERRING PROVIDER: Perrin Brakeman, J, PA  REFERRING DIAG:  [phone] (ICD-10-CM) - Malignant neoplasm of  upper-outer quadrant of right female breast  Z17.1 (ICD-10-CM) - Estrogen receptor negative status (ER-)  C50.919 (ICD-10-CM) - Malignant neoplasm of unspecified site of unspecified female breast  Z98.890 (ICD-10-CM) - Other specified postprocedural states   THERAPY DIAG:  Muscle weakness (generalized)  S/P bilateral mastectomy  Axillary web syndrome  Lymphedema of right arm  Rationale for Evaluation and Treatment: Rehabilitation  ONSET DATE: Feb 1st 2025  SUBJECTIVE:                                                                                                                                                                                          SUBJECTIVE STATEMENT: Pt in agreement with readiness for OT d/c this date. Accompanied by: self and mom  PERTINENT HISTORY: Per MEDICAL RECORD NUMBER Pt diagnosed with malignant neoplasm of upper-outer quadrant of right breast in female, estrogen receptor negative (HCC) 10/22/2022.  Pt is s/p bilat mastectomy and reconstruction with expanders in place 05/08/2023. Currently receiving p.o. chemo therapy with Xeloda , which she will finish early June.  PAIN: 10/29/23: 0 pain at rest, 1/10 pain in wrist with flexion at work (no pain in the elbow or arm with activity in the last week)/ 2/10 pain scar beneath R breast with palpation Are you having pain? None at rest; pain present with activity  NPRS scale: 5/10 Pain location: R volar arm from the shoulder to the wrist  Pain orientation: Right  PAIN TYPE: sharp, tight Pain description: intermittent/with activity Aggravating factors: extending the elbow/reaching into flexion and abd Relieving factors: rest   PRECAUTIONS: Lymphedema precautions: avoid needle sticks/BPs to RUE.  Pt is currently limiting herself to lifting no more than 40# at work.  Pt is currently undergoing chemotherapy p.o.  RED FLAGS: None   WEIGHT BEARING RESTRICTIONS: No  FALLS:  Has patient fallen in last 6 months?  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  OBJECTIVE: Note: Objective measures were completed at Evaluation unless otherwise noted.  COGNITION: Overall cognitive status: Within functional limits for tasks assessed   PALPATION: Mild cording R volar forearm, extending to upper arm and axilla.   OBSERVATIONS / OTHER ASSESSMENTS:  RUE: No visible swelling, but tenderness with palpation at the biceps and volar forearm.  Skin is taught throughout the RUE from the upper arm to the distal forearm; non-pitting.  Mild cording R volar forearm, extending to upper arm and axilla; LUE: non-tender, supple  SENSATION: reports tingling in the ulnar nerve distribution of RUE only with prolonged positioning of elbow in flexion (when playing video games or holding phone).  POSTURE: No significant postural limitations  UPPER EXTREMITY AROM/PROM: (Per PT eval on 08/07/23)      09/18/23: Will remeasure RUE AROM next week to allow an additional week for incision healing  AROM RIGHT   eval  Right 09/22/23 Right 10/29/23  Shoulder extension     Shoulder flexion 135 142 168  Shoulder abduction 119 149 180  Shoulder internal rotation Observed WNL WNL WNL  Shoulder external rotation 65 90 100  Elbow extension -10 0 0    (Blank rows = not tested)  A/PROM LEFT   eval LEFT 09/22/23 LEFT 10/29/23  Shoulder extension     Shoulder flexion 150 144 160  Shoulder abduction 137 180 80  Shoulder internal rotation Observed WNL WNL WNL  Shoulder external rotation 60 83 90  Elbow extension 0 0 0    (Blank rows = not tested)  UPPER EXTREMITY STRENGTH:   RUE grossly 4/5, LUE grossly 4+/5 (Per PT eval on 08/07/23) 10/29/23: BUE grossly 4+/5  LYMPHEDEMA ASSESSMENTS:   SURGERY TYPE/DATE: 05/08/2023 bilat mastectomy with reconstruction   NUMBER OF LYMPH NODES REMOVED: SLNB: 2 axillary lymph nodes removed   CHEMOTHERAPY: 16 rounds ending early/mid Oct 2024, now has a chemo pill 2x/day for 14 days, 1 week off for 8 cycles, ending in June  RADIATION: None  HORMONE TREATMENT: None  INFECTIONS: None  LYMPHEDEMA ASSESSMENTS:   UE circumferential measurements in cm  Landmark R Date: 08/25/23 L Date: 08/25/23 R Date: R Date:  1  MP (distal base of SF) 18.7 17.9    2  Wrist crease 15.5 15.0    3 Forearm (*) +4 cm 15.0 14.5    4 * +4 cm 17.3 16.4    5 * +4 cm 20.0 18.8    6 * +4 cm 21.9 21.5    7 * +4 cm 22.8 22.7    8  Elbow crease 22.2 22.7    9 Upper arm (**) +4 cm 22.0 21.8    10 ** +4 cm 22.2 22.5    11 ** +4 cm 23.7 23.8    12 ** +4 cm 25.4 25.5                      Total circumferential cm 246.7 cm 243.1 cm      Total Limb  differential 3.6 cm  (R 1.5% larger than L; all  points of measure are <2 cm differential throughout RUE/LUE)        Change from initial          TODAY'S  TREATMENT:                                                                                                                              DATE: 10/29/23 Therapeutic Activity: Objective measures taken and goals updated and reviewed for discharge summary.  Self Care: Reviewed condition management: lymphedema precautions, infection prevention, s/s of infection, scar massage technique  PATIENT EDUCATION:  Education details: Progress towards goals; condition management Person educated: Patient Education method: Explanation Education comprehension: verbalized understanding  HOME EXERCISE PROGRAM: AWS exercises, scar massage  ASSESSMENT: CLINICAL IMPRESSION: Pt seen by OT for 15 sessions to address RUE lymphatic cording, lymphedema  management/prevention, manual therapy, compression garment wearing schedule, skin care/scar massage technique, and instruction in HEP.  At d/c, BUE ROM has returned to baseline/WNL, pain in R upper arm and elbow are no longer present, only mild R wrist pain with active/passive flexion d/t a recent flare up from an old wrist injury, though this has also improved to 1-2/10 pain or less with activity.  Pt is now indep with AWS HEP and lymphatic cording, previously easily palpable and is now no longer palpable with pt also reporting that she has not had any discomfort or "catching" in the RUE in the last week or 2.  Pt has scar tissue superior to incision below R breast and demonstrates good technique and consistency with scar massage to reduce adhesions and soften areas of scar tissue build up from recent breast reconstruction sx.  Pt has met all OT goals and feels confident with long term management strategies to reduce risk of flare ups related to lymphedema or cording.  Pt returned to work last week with a modified schedule and continues to practice limited lifting to ease her way back to baseline activity level and reports good tolerance to work related tasks thus far.  Pt appropriate for d/c this date; pt in agreement with plan.  OBJECTIVE IMPAIRMENTS: decreased knowledge of condition, decreased knowledge of use of DME, decreased ROM, decreased strength, increased edema, increased fascial restrictions, impaired flexibility, impaired sensation, impaired UE functional use, and pain.   ACTIVITY LIMITATIONS: carrying, lifting, dressing, reach over head, and caring for others  PARTICIPATION LIMITATIONS: occupation  PERSONAL FACTORS: 1-2 comorbidities: bilat mastectomy, reconstruction are also affecting patient's functional outcome.   REHAB POTENTIAL: Excellent  CLINICAL DECISION MAKING: Evolving/moderate complexity  EVALUATION COMPLEXITY: Moderate  GOALS: Goals reviewed with patient? Yes  SHORT TERM  GOALS: Target date: 09/15/23   1.  Pt will be indep to verbalize 2-3 lymphedema precautions to prevent infection and lymphedema flare ups. Baseline: Eval: Initiated educ on optimal skin care; further training needed; 09/17/33: indep with precautions Goal status: achieved  2.  Pt will be indep to verbalize 1-2 signs of lymphedema flare ups and identify subsequent action steps. Baseline: Eval: Educ not yet initiated; 09/18/23: indep with s/s of flare ups and action steps Goal status: achieved  LONG TERM GOALS: Target date: 10/30/23  Pt will be indep to perform HEP for reducing R AWS and promoting RUE lymphatic drainage with use of visual handouts. Baseline: Eval: HEP not yet initiated; 09/18/23: HEP has been on hold over the last 2 weeks since pt's sx; pt has been given the ok to restart exercises this week to her tolerance; will assess independence with HEP next week; 10/29/23: indep Goal status: achieved   2.  Pt will demonstrate self MLD technique with 75% accuracy using visual handouts as needed to promote lymphatic drainage throughout the RUE. Baseline: Eval: Instruction not yet initiated; 09/18/23: Limited BUE ROM post breast implant sx last week, so self MLD not yet attempted.  Pt requires min-mod vc for sequencing when reviewing verbally; 10/29/23: indep Goal status: achieved  3.  Pt will demonstrate soft tissue massage technique with 75% accuracy using visual/written handouts as needed to reduce/release R AWS in the  RUE. Baseline: Eval: Not yet initiated; 09/18/23: indep with technique but currently limited with ability to perform self massage d/t limited ROM post breast sx last week, and family not consistently available to perform; 10/29/23: indep Goal status: achieved  4.  Pt will demonstrate independence with donning/doffing class 1 compression sleeve and gauntlet to the RUE. Baseline: Eval: Not yet obtained; 09/18/23: Pt planning to pick up her sleeve today; 10/29/23: indep Goal status:  achieved  5.  Pt will demonstrate 75% or better adherence to recommended wearing schedule for compression sleeve/glove. Baseline: Eval: Not yet obtained/wearing schedule not yet reviewed; 09/18/23: Defer; Pt will be picking up sleeve today; 10/29/23: 90-100% adherence to wearing schedule Goal status: achieved  PLAN:  OT FREQUENCY: 2x/week  OT DURATION: 6 weeks  PLANNED INTERVENTIONS: 62952- OT Re-Evaluation, 97110-Therapeutic exercises, 97530- Therapeutic activity, 97535- Self Care, 84132- Manual therapy, 97760- Orthotic Fit/training, Patient/Family education, Joint mobilization, Manual lymph drainage, Scar mobilization, Compression bandaging, DME instructions, Cryotherapy, and Moist heat  PLAN FOR NEXT SESSION: manual therapy, review AWS HEP    Casandra Claw, OTR/L,CLT 10/31/2023, 8:35 AM

## 2023-10-31 ENCOUNTER — Ambulatory Visit: Admitting: Physical Therapy

## 2023-11-03 ENCOUNTER — Inpatient Hospital Stay

## 2023-11-03 ENCOUNTER — Ambulatory Visit

## 2023-11-03 ENCOUNTER — Ambulatory Visit: Admitting: Physical Therapy

## 2023-11-03 DIAGNOSIS — C50411 Malignant neoplasm of upper-outer quadrant of right female breast: Secondary | ICD-10-CM

## 2023-11-03 LAB — CBC WITH DIFFERENTIAL (CANCER CENTER ONLY)
Abs Immature Granulocytes: 0.01 10*3/uL (ref 0.00–0.07)
Basophils Absolute: 0 10*3/uL (ref 0.0–0.1)
Basophils Relative: 1 %
Eosinophils Absolute: 0 10*3/uL (ref 0.0–0.5)
Eosinophils Relative: 1 %
HCT: 39.3 % (ref 36.0–46.0)
Hemoglobin: 13.5 g/dL (ref 12.0–15.0)
Immature Granulocytes: 0 %
Lymphocytes Relative: 40 %
Lymphs Abs: 1.6 10*3/uL (ref 0.7–4.0)
MCH: 32.8 pg (ref 26.0–34.0)
MCHC: 34.4 g/dL (ref 30.0–36.0)
MCV: 95.4 fL (ref 80.0–100.0)
Monocytes Absolute: 0.3 10*3/uL (ref 0.1–1.0)
Monocytes Relative: 9 %
Neutro Abs: 2 10*3/uL (ref 1.7–7.7)
Neutrophils Relative %: 49 %
Platelet Count: 169 10*3/uL (ref 150–400)
RBC: 4.12 MIL/uL (ref 3.87–5.11)
RDW: 15.3 % (ref 11.5–15.5)
WBC Count: 3.9 10*3/uL — ABNORMAL LOW (ref 4.0–10.5)
nRBC: 0 % (ref 0.0–0.2)

## 2023-11-03 LAB — CMP (CANCER CENTER ONLY)
ALT: 12 U/L (ref 0–44)
AST: 16 U/L (ref 15–41)
Albumin: 4.4 g/dL (ref 3.5–5.0)
Alkaline Phosphatase: 58 U/L (ref 38–126)
Anion gap: 8 (ref 5–15)
BUN: 10 mg/dL (ref 6–20)
CO2: 24 mmol/L (ref 22–32)
Calcium: 9.2 mg/dL (ref 8.9–10.3)
Chloride: 104 mmol/L (ref 98–111)
Creatinine: 0.7 mg/dL (ref 0.44–1.00)
GFR, Estimated: >60 mL/min (ref >60)
Glucose, Bld: 112 mg/dL — ABNORMAL HIGH (ref 70–99)
Potassium: 4.1 mmol/L (ref 3.5–5.1)
Sodium: 136 mmol/L (ref 135–145)
Total Bilirubin: 0.9 mg/dL (ref 0.0–1.2)
Total Protein: 7.3 g/dL (ref 6.5–8.1)

## 2023-11-07 ENCOUNTER — Encounter

## 2023-11-10 ENCOUNTER — Ambulatory Visit

## 2023-11-18 ENCOUNTER — Other Ambulatory Visit (HOSPITAL_COMMUNITY): Payer: Self-pay

## 2023-11-19 ENCOUNTER — Encounter

## 2023-11-19 ENCOUNTER — Other Ambulatory Visit: Payer: Self-pay

## 2023-11-19 NOTE — Progress Notes (Signed)
 Specialty Pharmacy Refill Coordination Note  Eileen Hart is a 33 y.o. female contacted today regarding refills of specialty medication(s) Capecitabine  (XELODA )   Patient requested (Patient-Rptd) Delivery   Delivery date: (Patient-Rptd) 11/20/23   Verified address: (Patient-Rptd) [address] [place] [address]   Medication will be filled on 11/19/23.

## 2023-11-24 ENCOUNTER — Inpatient Hospital Stay: Attending: Oncology | Admitting: *Deleted

## 2023-11-24 DIAGNOSIS — Z87891 Personal history of nicotine dependence: Secondary | ICD-10-CM | POA: Insufficient documentation

## 2023-11-24 DIAGNOSIS — Z79899 Other long term (current) drug therapy: Secondary | ICD-10-CM | POA: Insufficient documentation

## 2023-11-24 DIAGNOSIS — C50411 Malignant neoplasm of upper-outer quadrant of right female breast: Secondary | ICD-10-CM | POA: Insufficient documentation

## 2023-11-24 DIAGNOSIS — Z171 Estrogen receptor negative status [ER-]: Secondary | ICD-10-CM | POA: Insufficient documentation

## 2023-11-24 DIAGNOSIS — Z9189 Other specified personal risk factors, not elsewhere classified: Secondary | ICD-10-CM

## 2023-11-24 NOTE — Progress Notes (Signed)
 SUBJECTIVE: Pt returns for her 3 month L-Dex screen.    PAIN:  Are you having pain? No   SOZO SCREENING: Patient was assessed today using the SOZO machine to determine the lymphedema index score. This was compared to her baseline score. It was determined that she is within the recommended range when compared to her baseline and no further action is needed at this time. She will continue SOZO screenings. These are done every 3 months for 2 years post operatively followed by every 6 months for 2 years, and then annually.     L-DEX FLOWSHEETS                L-DEX LYMPHEDEMA SCREENING    Measurement Type Unilateral     L-DEX MEASUREMENT EXTREMITY Upper Extremity     POSITION  Standing     DOMINANT SIDE Right     At Risk Side Right    BASELINE SCORE (UNILATERAL) 0.1    L-DEX SCORE (UNILATERAL) -3.2    VALUE CHANGE (UNILAT) -3.3

## 2023-11-26 ENCOUNTER — Encounter

## 2023-12-02 ENCOUNTER — Ambulatory Visit: Admitting: Oncology

## 2023-12-02 ENCOUNTER — Other Ambulatory Visit

## 2023-12-03 ENCOUNTER — Encounter: Payer: Self-pay | Admitting: Oncology

## 2023-12-03 ENCOUNTER — Telehealth: Payer: Self-pay

## 2023-12-03 ENCOUNTER — Inpatient Hospital Stay (HOSPITAL_BASED_OUTPATIENT_CLINIC_OR_DEPARTMENT_OTHER): Admitting: Oncology

## 2023-12-03 ENCOUNTER — Inpatient Hospital Stay

## 2023-12-03 VITALS — BP 127/93 | HR 96 | Temp 97.4°F | Resp 18 | Ht 64.0 in | Wt 115.4 lb

## 2023-12-03 DIAGNOSIS — C50411 Malignant neoplasm of upper-outer quadrant of right female breast: Secondary | ICD-10-CM

## 2023-12-03 DIAGNOSIS — Z171 Estrogen receptor negative status [ER-]: Secondary | ICD-10-CM | POA: Diagnosis not present

## 2023-12-03 DIAGNOSIS — Z87891 Personal history of nicotine dependence: Secondary | ICD-10-CM | POA: Diagnosis not present

## 2023-12-03 DIAGNOSIS — Z79899 Other long term (current) drug therapy: Secondary | ICD-10-CM

## 2023-12-03 DIAGNOSIS — D229 Melanocytic nevi, unspecified: Secondary | ICD-10-CM

## 2023-12-03 LAB — CBC WITH DIFFERENTIAL (CANCER CENTER ONLY)
Abs Immature Granulocytes: 0.01 10*3/uL (ref 0.00–0.07)
Basophils Absolute: 0 10*3/uL (ref 0.0–0.1)
Basophils Relative: 1 %
Eosinophils Absolute: 0.1 10*3/uL (ref 0.0–0.5)
Eosinophils Relative: 1 %
HCT: 37.8 % (ref 36.0–46.0)
Hemoglobin: 12.9 g/dL (ref 12.0–15.0)
Immature Granulocytes: 0 %
Lymphocytes Relative: 28 %
Lymphs Abs: 1.5 10*3/uL (ref 0.7–4.0)
MCH: 33.3 pg (ref 26.0–34.0)
MCHC: 34.1 g/dL (ref 30.0–36.0)
MCV: 97.7 fL (ref 80.0–100.0)
Monocytes Absolute: 0.4 10*3/uL (ref 0.1–1.0)
Monocytes Relative: 7 %
Neutro Abs: 3.4 10*3/uL (ref 1.7–7.7)
Neutrophils Relative %: 63 %
Platelet Count: 173 10*3/uL (ref 150–400)
RBC: 3.87 MIL/uL (ref 3.87–5.11)
RDW: 16.1 % — ABNORMAL HIGH (ref 11.5–15.5)
WBC Count: 5.4 10*3/uL (ref 4.0–10.5)
nRBC: 0 % (ref 0.0–0.2)

## 2023-12-03 LAB — CMP (CANCER CENTER ONLY)
ALT: 14 U/L (ref 0–44)
AST: 19 U/L (ref 15–41)
Albumin: 4.1 g/dL (ref 3.5–5.0)
Alkaline Phosphatase: 60 U/L (ref 38–126)
Anion gap: 5 (ref 5–15)
BUN: 10 mg/dL (ref 6–20)
CO2: 24 mmol/L (ref 22–32)
Calcium: 8.7 mg/dL — ABNORMAL LOW (ref 8.9–10.3)
Chloride: 106 mmol/L (ref 98–111)
Creatinine: 0.67 mg/dL (ref 0.44–1.00)
GFR, Estimated: >60 mL/min (ref >60)
Glucose, Bld: 112 mg/dL — ABNORMAL HIGH (ref 70–99)
Potassium: 4.8 mmol/L (ref 3.5–5.1)
Sodium: 135 mmol/L (ref 135–145)
Total Bilirubin: 1 mg/dL (ref 0.0–1.2)
Total Protein: 6.9 g/dL (ref 6.5–8.1)

## 2023-12-03 NOTE — Progress Notes (Signed)
 [facility] [phone]; on recording it states they are not accepting new patients.  There was also a message from 09/29/23 Sherrel Dodge in the referral notes indicating Currently not accepting new patients.  [facility], voice message left. Called [facility] just in case; spoke to a representative who indicated that Dr. Felipe Horton may be available to accommodate new patient appointments.  Provided representative with patient's name and DOB; stated she will keep an eye out for her name. Referral resent to [facility]; will follow up in about a week to make sure patient was scheduled accordingly.

## 2023-12-03 NOTE — Telephone Encounter (Signed)
 [facility] [phone]; on recording it states they are not accepting new patients.  There was also a message from 09/29/23 Sherrel Dodge in the referral notes indicating Currently not accepting new patients.  [facility], voice message left. Called [facility] just in case; spoke to a representative who indicated that Dr. Felipe Horton may be available to accommodate new patient appointments.  Provided representative with patient's name and DOB; stated she will keep an eye out for her name. Referral resent to [facility]; will follow up in about a week to make sure patient was scheduled accordingly.

## 2023-12-04 ENCOUNTER — Other Ambulatory Visit: Payer: Self-pay

## 2023-12-04 ENCOUNTER — Other Ambulatory Visit: Payer: Self-pay | Admitting: Oncology

## 2023-12-04 DIAGNOSIS — C50411 Malignant neoplasm of upper-outer quadrant of right female breast: Secondary | ICD-10-CM

## 2023-12-04 NOTE — Progress Notes (Signed)
 Specialty Pharmacy Ongoing Clinical Assessment Note  Eileen Hart is a 33 y.o. female who is being followed by the specialty pharmacy service for RxSp Oncology   Patient's specialty medication(s) reviewed today: Capecitabine  (XELODA )   Missed doses in the last 4 weeks: 0   Patient/Caregiver did not have any additional questions or concerns.   Therapeutic benefit summary: Patient is achieving benefit   Adverse events/side effects summary: Experienced adverse events/side effects (some fatigue, but tolerable)   Patient's therapy is appropriate to: Continue    Goals Addressed             This Visit's Progress    Achieve a cure   On track    Patient is on track. Patient will maintain adherence. Patient stated she has 2 more rounds of Xeloda  and will be seen again 12/31/23          Follow up: 3 months  [facility] Specialty Pharmacist

## 2023-12-04 NOTE — Progress Notes (Signed)
 Specialty Pharmacy Refill Coordination Note  Eileen Hart is a 33 y.o. female contacted today regarding refills of specialty medication(s) Capecitabine  (XELODA )   Patient requested Delivery   Delivery date: 12/10/23   Verified address: [address] [place] [address]   Medication will be filled on 12/09/23. This fill date is pending response to refill request from provider. Patient is aware and if they have not received fill by intended date they must follow up with pharmacy.

## 2023-12-05 ENCOUNTER — Other Ambulatory Visit: Payer: Self-pay

## 2023-12-05 ENCOUNTER — Other Ambulatory Visit (HOSPITAL_COMMUNITY): Payer: Self-pay

## 2023-12-05 ENCOUNTER — Encounter: Payer: Self-pay | Admitting: Oncology

## 2023-12-05 MED ORDER — CAPECITABINE 500 MG PO TABS
1500.0000 mg | ORAL_TABLET | Freq: Two times a day (BID) | ORAL | 1 refills | Status: DC
  Filled 2023-12-05: qty 84, 21d supply, fill #0
  Filled 2023-12-19: qty 84, 21d supply, fill #1

## 2023-12-07 ENCOUNTER — Encounter: Payer: Self-pay | Admitting: Oncology

## 2023-12-07 NOTE — Progress Notes (Signed)
 Hematology/Oncology Consult note [facility]  [phone] [phone]  Patient Care Team: Pcp, No as PCP - General Waverly Hageman, RN as Oncology Nurse Navigator Avonne Boettcher, MD as Consulting Physician (Oncology) Alben Alma, MD as Consulting Physician (General Surgery) Dillingham, Lindaann Requena, DO as Attending Physician (Plastic Surgery)   Name of the patient: Eileen Hart  [id number]  [birth year]   Date of visit: 12/07/23  Diagnosis- clinical prognostic stage Ib invasive mammary carcinoma of the right breast cT1 cN0 M0 triple negative     Chief complaint/ Reason for visit- on treatment assessment prior to cycle 7 of xeloda   Heme/Onc history: Patient is a 33 year old female who underwent a bilateral diagnostic mammogram on 10/07/2022 after she felt a palpable area of concern in her right breast. Mammogram showed a 1.6 x 1 x 1.5 cm solid mass in the right breast 9:30 position 5 cm from the nipple. There was also an adjacent 6 x 9 x 2 mm mass in the right breast. No right axillary adenopathy. The dominant mass was biopsied and was consistent with invasive mammary carcinoma grade 3 ER and HER2 negative.    Bilateral breast MRI showed additional areas of concern in the right breast.  2 additional enhancing masses 1 in the 12 o'clock position and 1 in the 4 o'clock position.  2 adjacent prominent right axillary lymph nodes.  2 intermittent masses in the left breast 9 o'clock position and upper outer left breast.  This was followed by a dedicated ultrasound.  She had 3 breast biopsies in the right side and 1 left breast biopsy and all of them were negative for malignancy.  On the ultrasound her right axillary lymph nodes appeared normal and therefore biopsy was not recommended for the same.   PET CT scan showed focal hypermetabolic activity in the right breast adjacent to the biopsy clip but no other additional areas of concern.  Baseline MUGA scan  showed normal EF.   Given that additional biopsies were negative for malignancy and the only biopsy-proven site was 1.6 cm triple negative breast cancer with negative lymph nodes plan was to do [facility] Taxol  chemotherapy neoadjuvant without opting for keynote 522 regimen.    Mild decrease in the size of primary breast mass after 4 cycles of AC chemotherapy. Carboplatin  added to taxol  neoadjuvantly   Patient underwent bilateral mastectomy with reconstruction on 05/08/2023.No malignancy noted in the left breast. She was found to have 0.6 cm invasive ductal carcinoma high-grade in the right breast. There was another area of fibromatoid change noted adjacent to it and the intervening parenchyma between these 2 areas showed an additional 0.4 cm invasive ductal carcinoma 2 sentinel lymph nodes negative for malignancy. No definitive response to presurgical therapy in the invasive carcinoma. Overall cancer cellularity 95%. ypT1b N0.     Interval history-overall she is doing well.  Denies any issues with her implants.  She is tolerating Xeloda  well  ECOG PS- 0 Pain scale- 0   Review of systems- Review of Systems  Constitutional:  Negative for chills, fever, malaise/fatigue and weight loss.  HENT:  Negative for congestion, ear discharge and nosebleeds.   Eyes:  Negative for blurred vision.  Respiratory:  Negative for cough, hemoptysis, sputum production, shortness of breath and wheezing.   Cardiovascular:  Negative for chest pain, palpitations, orthopnea and claudication.  Gastrointestinal:  Negative for abdominal pain, blood in stool, constipation, diarrhea, heartburn, melena, nausea and vomiting.  Genitourinary:  Negative  for dysuria, flank pain, frequency, hematuria and urgency.  Musculoskeletal:  Negative for back pain, joint pain and myalgias.  Skin:  Negative for rash.  Neurological:  Negative for dizziness, tingling, focal weakness, seizures, weakness and headaches.  Endo/Heme/Allergies:  Does not  bruise/bleed easily.  Psychiatric/Behavioral:  Negative for depression and suicidal ideas. The patient does not have insomnia.       No Known Allergies   Past Medical History:  Diagnosis Date   Chlamydia 01/25/2010   Depression with anxiety    Gestational hypertension    resolved after delivery   Immunization, viral disease    gardasil series completed   Invasive carcinoma of breast (HCC)    right   Tobacco abuse 05/02/2016     Past Surgical History:  Procedure Laterality Date   AUGMENTATION MAMMAPLASTY Bilateral    saline implants 2015   BREAST BIOPSY Right 10/14/2022   us  biopsy/ coil clip/ path pending   BREAST BIOPSY Right 10/14/2022   US  RT BREAST BX W LOC DEV 1ST LESION IMG BX SPEC US  GUIDE 10/14/2022 [facility]   BREAST BIOPSY Left 10/29/2022   Us  Core Bx Ribbon clip- path pending   BREAST BIOPSY Right 10/29/2022   US  Core 1130 Venus Clip- path pending   BREAST BIOPSY Right 10/29/2022   Us  Core Bx Retroareolar heart clip-path pending   BREAST BIOPSY Right 10/29/2022   Us  Core Bx Ribbon Clip path pending   BREAST BIOPSY Right 10/29/2022   US  RT BREAST BX W LOC DEV EA ADD LESION IMG BX SPEC US  GUIDE 10/29/2022 [facility]   BREAST BIOPSY Right 10/29/2022   US  RT BREAST BX W LOC DEV 1ST LESION IMG BX SPEC US  GUIDE 10/29/2022 [facility]   BREAST BIOPSY Right 10/29/2022   US  RT BREAST BX W LOC DEV EA ADD LESION IMG BX SPEC US  GUIDE 10/29/2022 [facility]   BREAST BIOPSY Left 10/29/2022   US  LT BREAST BX W LOC DEV 1ST LESION IMG BX SPEC US  GUIDE 10/29/2022 [facility]   BREAST ENHANCEMENT SURGERY  2015   BREAST RECONSTRUCTION WITH PLACEMENT OF TISSUE EXPANDER AND FLEX HD (ACELLULAR HYDRATED DERMIS) Bilateral 05/08/2023   Procedure: Bilateral immediate breast reconstruction with expanders and Flex HD placement;  Surgeon: Thornell Flirt, DO;  Location: [facility];  Service: Plastics;  Laterality: Bilateral;   CESAREAN SECTION  2014   MASTECTOMY  W/ SENTINEL NODE BIOPSY Bilateral 05/08/2023   Procedure: MASTECTOMY Simple WITH SENTINEL LYMPH NODE BIOPSY, RNFA to assist;  Surgeon: Alben Alma, MD;  Location: [facility];  Service: General;  Laterality: Bilateral;   PORT-A-CATH REMOVAL  04/2023   PORTACATH PLACEMENT N/A 10/24/2022   PortA Cath removed; Procedure: INSERTION PORT-A-CATH;  Surgeon: Alben Alma, MD;  Location: [facility];  Service: General;  Laterality: N/A;   REMOVAL OF BILATERAL TISSUE EXPANDERS WITH PLACEMENT OF BILATERAL BREAST IMPLANTS Bilateral 09/08/2023   Procedure: REMOVAL, TISSUE EXPANDER, BREAST, BILATERAL, WITH BILATERAL IMPLANT IMPLANT INSERTION;  Surgeon: Thornell Flirt, DO;  Location: [facility];  Service: Plastics;  Laterality: Bilateral;   TONSILLECTOMY      Social History   Socioeconomic History   Marital status: Single    Spouse name: Not on file   Number of children: 1   Years of education: 15   Highest education level: Not on file  Occupational History   Occupation: Groomer    Comment: [employer]  Tobacco Use   Smoking status: Former    Current packs/day: 0.00    Types: Cigarettes  Quit date: 07/25/2018    Years since quitting: 5.3    Passive exposure: Past   Smokeless tobacco: Never   Tobacco comments:    quit 07/2018  Vaping Use   Vaping status: Former   Quit date: 07/25/2022  Substance and Sexual Activity   Alcohol use: No    Alcohol/week: 0.0 standard drinks of alcohol   Drug use: No   Sexual activity: Yes    Birth control/protection: None  Other Topics Concern   Not on file  Social History Narrative   Not on file   Social Drivers of Health   Financial Resource Strain: Not on file  Food Insecurity: No Food Insecurity (05/08/2023)   Hunger Vital Sign    Worried About Running Out of Food in the Last Year: Never true    Ran Out of Food in the Last Year: Never true  Transportation Needs: No Transportation Needs (05/08/2023)   PRAPARE - Scientist, research (physical sciences) (Medical): No    Lack of Transportation (Non-Medical): No  Physical Activity: Not on file  Stress: Not on file  Social Connections: Not on file  Intimate Partner Violence: Not At Risk (05/08/2023)   Humiliation, Afraid, Rape, and Kick questionnaire    Fear of Current or Ex-Partner: No    Emotionally Abused: No    Physically Abused: No    Sexually Abused: No    Family History  Problem Relation Age of Onset   Breast cancer Other 35       mat 2nd cousin   Diabetes Neg Hx    Heart disease Neg Hx    Hypertension Neg Hx    Ovarian cancer Neg Hx    Colon cancer Neg Hx      Current Outpatient Medications:    acetaminophen  (TYLENOL ) 500 MG tablet, Take 1,000 mg by mouth every 6 (six) hours as needed for moderate pain (pain score 4-6)., Disp: , Rfl:    capecitabine  (XELODA ) 500 MG tablet, Take 3 tablets (1,500 mg total) by mouth 2 (two) times daily after a meal. Take for 14 days, then hold for 7 days. Repeat every 21 days., Disp: 84 tablet, Rfl: 1   diazepam  (VALIUM ) 2 MG tablet, Take 1 tablet (2 mg total) by mouth every 12 (twelve) hours as needed for muscle spasms., Disp: 5 tablet, Rfl: 0   diclofenac  Sodium (VOLTAREN ) 1 % GEL, Research Patient: Apply 0.5 grams (1 fingertip) to each hand and each foot twice daily for up to 12 weeks, Disp: 400 g, Rfl: 0   gabapentin  (NEURONTIN ) 300 MG capsule, Take 1 capsule (300 mg total) by mouth 3 (three) times daily., Disp: 90 capsule, Rfl: 3   ibuprofen (ADVIL) 200 MG tablet, Take 600-800 mg by mouth every 6 (six) hours as needed for moderate pain (pain score 4-6)., Disp: , Rfl:    ondansetron  (ZOFRAN ) 4 MG tablet, Take 1 tablet (4 mg total) by mouth every 8 (eight) hours as needed for nausea or vomiting., Disp: 60 tablet, Rfl: 2   ondansetron  (ZOFRAN ) 4 MG tablet, Take 1 tablet (4 mg total) by mouth every 8 (eight) hours as needed for nausea or vomiting., Disp: 20 tablet, Rfl: 0   oxyCODONE  (OXY IR/ROXICODONE ) 5 MG immediate release  tablet, Take 1 tablet (5 mg total) by mouth every 6 (six) hours as needed for up to 10 doses for severe pain (pain score 7-10)., Disp: 10 tablet, Rfl: 0  Physical exam:  Vitals:   12/03/23 1150  BP: Aaron Aas)  127/93  Pulse: 96  Resp: 18  Temp: (!) 97.4 F (36.3 C)  TempSrc: Tympanic  SpO2: 100%  Weight: 115 lb 6.4 oz (52.3 kg)  Height: 5' 4 (1.626 m)   Physical Exam  Cardiovascular:     Rate and Rhythm: Normal rate and regular rhythm.     Heart sounds: Normal heart sounds.  Pulmonary:     Effort: Pulmonary effort is normal.     Breath sounds: Normal breath sounds.  Abdominal:     General: Bowel sounds are normal.   Skin:    General: Skin is warm and dry.   Neurological:     Mental Status: She is alert and oriented to person, place, and time.      I have personally reviewed labs listed below:    Latest Ref Rng & Units 12/03/2023   11:27 AM  CMP  Glucose 70 - 99 mg/dL 010   BUN 6 - 20 mg/dL 10   Creatinine 2.72 - 1.00 mg/dL 5.36   Sodium 644 - 034 mmol/L 135   Potassium 3.5 - 5.1 mmol/L 4.8   Chloride 98 - 111 mmol/L 106   CO2 22 - 32 mmol/L 24   Calcium 8.9 - 10.3 mg/dL 8.7   Total Protein 6.5 - 8.1 g/dL 6.9   Total Bilirubin 0.0 - 1.2 mg/dL 1.0   Alkaline Phos 38 - 126 U/L 60   AST 15 - 41 U/L 19   ALT 0 - 44 U/L 14       Latest Ref Rng & Units 12/03/2023   11:28 AM  CBC  WBC 4.0 - 10.5 K/uL 5.4   Hemoglobin 12.0 - 15.0 g/dL 74.2   Hematocrit 59.5 - 46.0 % 37.8   Platelets 150 - 400 K/uL 173       Assessment and plan- Patient is a 33 y.o. female with history of stage Ib invasive mammary carcinoma of the right breast cT1 cN0 M0 triple negative status post neoadjuvant AC CarboTaxol chemotherapy. She had 2 foci of residual disease 0.6 and 0.4 cm after neoadjuvant chemotherapy.  She is here for on treatment assessment prior to cycle 7 of Xeloda    Counts okay to proceed with cycle 7 of Xeloda  today and I will see her back on 7 9 with repeat labs prior to her  starting cycle 8 which will be her last cycle.  Given that she has triple negative disease there would be no indication for endocrine therapy. Visit Diagnosis 1. Malignant neoplasm of upper-outer quadrant of right breast in female, estrogen receptor negative (HCC)   2. High risk medication use      Dr. Seretha Dance, MD, MPH [facility] at [facility] [phone] 12/07/2023 5:06 PM

## 2023-12-08 NOTE — Telephone Encounter (Signed)
 Outbound call to [facility], stated they are accepting new patients but are scheduling for October and November.  Fax number for referrals [phone].  Fax confirmation received.

## 2023-12-11 ENCOUNTER — Ambulatory Visit: Admitting: Dermatology

## 2023-12-11 ENCOUNTER — Encounter: Payer: Self-pay | Admitting: Dermatology

## 2023-12-11 ENCOUNTER — Telehealth: Payer: Self-pay | Admitting: *Deleted

## 2023-12-11 DIAGNOSIS — W908XXA Exposure to other nonionizing radiation, initial encounter: Secondary | ICD-10-CM | POA: Diagnosis not present

## 2023-12-11 DIAGNOSIS — D1801 Hemangioma of skin and subcutaneous tissue: Secondary | ICD-10-CM

## 2023-12-11 DIAGNOSIS — Z853 Personal history of malignant neoplasm of breast: Secondary | ICD-10-CM

## 2023-12-11 DIAGNOSIS — L578 Other skin changes due to chronic exposure to nonionizing radiation: Secondary | ICD-10-CM

## 2023-12-11 DIAGNOSIS — Z1283 Encounter for screening for malignant neoplasm of skin: Secondary | ICD-10-CM | POA: Diagnosis not present

## 2023-12-11 DIAGNOSIS — D229 Melanocytic nevi, unspecified: Secondary | ICD-10-CM

## 2023-12-11 DIAGNOSIS — L814 Other melanin hyperpigmentation: Secondary | ICD-10-CM

## 2023-12-11 DIAGNOSIS — D2271 Melanocytic nevi of right lower limb, including hip: Secondary | ICD-10-CM

## 2023-12-11 DIAGNOSIS — D2272 Melanocytic nevi of left lower limb, including hip: Secondary | ICD-10-CM

## 2023-12-11 NOTE — Telephone Encounter (Signed)
 Per Valinda Gault I called about getting an appointment for patient and the message today was that it is a holiday today and we could leave a message and they will get back with us  and that is what I did I left a message and when they get back in they will call us  hopefully with a appointment for this patient.  12/11/23  8:49 AM Referral to [facility]  dermatology at [facility].

## 2023-12-11 NOTE — Telephone Encounter (Signed)
 I called about getting an appointment for patient and the message today was that it is a holiday today and we could leave a message and they will get back with us  and that is what I did I left a message and when they get back in they will call us  hopefully with a appointment for this patient

## 2023-12-11 NOTE — Patient Instructions (Addendum)

## 2023-12-11 NOTE — Progress Notes (Signed)
   New Patient Visit   Subjective  Eileen Hart is a 33 y.o. female who presents for the following: Skin Cancer Screening and Full Body Skin Exam, pt has noticed some new freckles on legs, bottoms of feet since chemo from breast cancer, no hx of skin cancer, no fhx of skin cancer  The patient presents for Total-Body Skin Exam (TBSE) for skin cancer screening and mole check. The patient has spots, moles and lesions to be evaluated, some may be new or changing and the patient may have concern these could be cancer.  New patient referral from Dr. Seretha Dance.  The following portions of the chart were reviewed this encounter and updated as appropriate: medications, allergies, medical history  Review of Systems:  No other skin or systemic complaints except as noted in HPI or Assessment and Plan.  Objective  Well appearing patient in no apparent distress; mood and affect are within normal limits.  A full examination was performed including scalp, head, eyes, ears, nose, lips, neck, chest, axillae, abdomen, back, buttocks, bilateral upper extremities, bilateral lower extremities, hands, feet, fingers, toes, fingernails, and toenails. All findings within normal limits unless otherwise noted below.   Exam of nails limited by presence of nail polish.   Relevant physical exam findings are noted in the Assessment and Plan.   Left foot   Right foot    Assessment & Plan   SKIN CANCER SCREENING PERFORMED TODAY.  ACTINIC DAMAGE - Chronic condition, secondary to cumulative UV/sun exposure - diffuse scaly erythematous macules with underlying dyspigmentation - Recommend daily broad spectrum sunscreen SPF 30+ to sun-exposed areas, reapply every 2 hours as needed.  - Staying in the shade or wearing long sleeves, sun glasses (UVA+UVB protection) and wide brim hats (4-inch brim around the entire circumference of the hat) are also recommended for sun protection.  - Call for new or changing  lesions.  LENTIGINES, HEMANGIOMAS - Benign normal skin lesions - Benign-appearing - Call for any changes  MELANOCYTIC NEVI - Tan-brown and/or pink-flesh-colored symmetric macules and papules - Benign appearing on exam today - Observation - Call clinic for new or changing moles - Recommend daily use of broad spectrum spf 30+ sunscreen to sun-exposed areas.  - R foot 5.34mm see photo - L foot 3.35mm see photo  HISTORY BREAST CANCER R breast Exam: R breast clear to visual skin exam  Treatment Plan: Cont f/u with Oncology    MULTIPLE BENIGN NEVI   CHERRY ANGIOMA   LENTIGINES   ACTINIC ELASTOSIS    Return for 1-2 yrs TBSE.  I, Rollie Clipper, RMA, am acting as scribe for Harris Liming, MD .   Documentation: I have reviewed the above documentation for accuracy and completeness, and I agree with the above.  Harris Liming, MD

## 2023-12-12 ENCOUNTER — Other Ambulatory Visit: Payer: Self-pay

## 2023-12-15 ENCOUNTER — Encounter: Payer: Self-pay | Admitting: Oncology

## 2023-12-15 NOTE — Telephone Encounter (Signed)
 Outbound call to [place] location spoke to [place] who indicated it can take 3-4 weeks to schedule a referral and that this patient has not been scheduled as of yet.  Will follow back up in a few weeks to make sure patient was scheduled.

## 2023-12-15 NOTE — Telephone Encounter (Signed)
 My chart message sent to patient providing an update for dermatology referral.

## 2023-12-19 ENCOUNTER — Encounter (INDEPENDENT_AMBULATORY_CARE_PROVIDER_SITE_OTHER): Payer: Self-pay

## 2023-12-19 ENCOUNTER — Other Ambulatory Visit: Payer: Self-pay

## 2023-12-19 NOTE — Progress Notes (Signed)
 Specialty Pharmacy Refill Coordination Note  Eileen Hart is a 33 y.o. female contacted today regarding refills of specialty medication(s) Capecitabine  (XELODA )   Patient requested (Patient-Rptd) Delivery   Delivery date: 12/30/23   Verified address: (Patient-Rptd) [address] [place] [address]   Medication will be filled on 07.08.25.

## 2023-12-31 ENCOUNTER — Inpatient Hospital Stay: Attending: Oncology

## 2023-12-31 ENCOUNTER — Inpatient Hospital Stay: Admitting: Oncology

## 2023-12-31 VITALS — BP 102/74 | HR 73 | Temp 97.7°F | Resp 20 | Wt 117.3 lb

## 2023-12-31 DIAGNOSIS — Z87891 Personal history of nicotine dependence: Secondary | ICD-10-CM | POA: Diagnosis not present

## 2023-12-31 DIAGNOSIS — Z79899 Other long term (current) drug therapy: Secondary | ICD-10-CM

## 2023-12-31 DIAGNOSIS — Z9013 Acquired absence of bilateral breasts and nipples: Secondary | ICD-10-CM | POA: Diagnosis not present

## 2023-12-31 DIAGNOSIS — T451X5A Adverse effect of antineoplastic and immunosuppressive drugs, initial encounter: Secondary | ICD-10-CM | POA: Diagnosis not present

## 2023-12-31 DIAGNOSIS — Z803 Family history of malignant neoplasm of breast: Secondary | ICD-10-CM | POA: Diagnosis not present

## 2023-12-31 DIAGNOSIS — C50411 Malignant neoplasm of upper-outer quadrant of right female breast: Secondary | ICD-10-CM | POA: Insufficient documentation

## 2023-12-31 DIAGNOSIS — Z171 Estrogen receptor negative status [ER-]: Secondary | ICD-10-CM | POA: Insufficient documentation

## 2023-12-31 DIAGNOSIS — G620 Drug-induced polyneuropathy: Secondary | ICD-10-CM | POA: Diagnosis not present

## 2023-12-31 LAB — CMP (CANCER CENTER ONLY)
ALT: 13 U/L (ref 0–44)
AST: 17 U/L (ref 15–41)
Albumin: 4.1 g/dL (ref 3.5–5.0)
Alkaline Phosphatase: 66 U/L (ref 38–126)
Anion gap: 6 (ref 5–15)
BUN: 10 mg/dL (ref 6–20)
CO2: 23 mmol/L (ref 22–32)
Calcium: 8.8 mg/dL — ABNORMAL LOW (ref 8.9–10.3)
Chloride: 106 mmol/L (ref 98–111)
Creatinine: 0.66 mg/dL (ref 0.44–1.00)
GFR, Estimated: >60 mL/min (ref >60)
Glucose, Bld: 93 mg/dL (ref 70–99)
Potassium: 3.9 mmol/L (ref 3.5–5.1)
Sodium: 135 mmol/L (ref 135–145)
Total Bilirubin: 1 mg/dL (ref 0.0–1.2)
Total Protein: 7 g/dL (ref 6.5–8.1)

## 2023-12-31 LAB — CBC WITH DIFFERENTIAL (CANCER CENTER ONLY)
Abs Immature Granulocytes: 0.01 K/uL (ref 0.00–0.07)
Basophils Absolute: 0 K/uL (ref 0.0–0.1)
Basophils Relative: 1 %
Eosinophils Absolute: 0.1 K/uL (ref 0.0–0.5)
Eosinophils Relative: 3 %
HCT: 35.5 % — ABNORMAL LOW (ref 36.0–46.0)
Hemoglobin: 12.4 g/dL (ref 12.0–15.0)
Immature Granulocytes: 0 %
Lymphocytes Relative: 31 %
Lymphs Abs: 1.3 K/uL (ref 0.7–4.0)
MCH: 34.1 pg — ABNORMAL HIGH (ref 26.0–34.0)
MCHC: 34.9 g/dL (ref 30.0–36.0)
MCV: 97.5 fL (ref 80.0–100.0)
Monocytes Absolute: 0.5 K/uL (ref 0.1–1.0)
Monocytes Relative: 12 %
Neutro Abs: 2.3 K/uL (ref 1.7–7.7)
Neutrophils Relative %: 53 %
Platelet Count: 175 K/uL (ref 150–400)
RBC: 3.64 MIL/uL — ABNORMAL LOW (ref 3.87–5.11)
RDW: 16.7 % — ABNORMAL HIGH (ref 11.5–15.5)
WBC Count: 4.3 K/uL (ref 4.0–10.5)
nRBC: 0 % (ref 0.0–0.2)

## 2023-12-31 NOTE — Progress Notes (Signed)
 Hematology/Oncology Consult note [facility]  [phone] [phone]  Patient Care Team: Pcp, No as PCP - General Georgina Shasta POUR, RN as Oncology Nurse Navigator Melanee Annah BROCKS, MD as Consulting Physician (Oncology) Jordis Laneta FALCON, MD as Consulting Physician (General Surgery) Dillingham, Estefana RAMAN, DO as Attending Physician (Plastic Surgery)   Name of the patient: Eileen Hart  [id number]  03/06/1991   Date of visit: 12/31/23  Diagnosis- clinical prognostic stage Ib invasive mammary carcinoma of the right breast cT1 cN0 M0 triple negative   Chief complaint/ Reason for visit-on treatment assessment prior to cycle 8 of adjuvant Xeloda   Heme/Onc history:  Patient is a 33 year old female who underwent a bilateral diagnostic mammogram on 10/07/2022 after she felt a palpable area of concern in her right breast. Mammogram showed a 1.6 x 1 x 1.5 cm solid mass in the right breast 9:30 position 5 cm from the nipple. There was also an adjacent 6 x 9 x 2 mm mass in the right breast. No right axillary adenopathy. The dominant mass was biopsied and was consistent with invasive mammary carcinoma grade 3 ER and HER2 negative.    Bilateral breast MRI showed additional areas of concern in the right breast.  2 additional enhancing masses 1 in the 12 o'clock position and 1 in the 4 o'clock position.  2 adjacent prominent right axillary lymph nodes.  2 intermittent masses in the left breast 9 o'clock position and upper outer left breast.  This was followed by a dedicated ultrasound.  She had 3 breast biopsies in the right side and 1 left breast biopsy and all of them were negative for malignancy.  On the ultrasound her right axillary lymph nodes appeared normal and therefore biopsy was not recommended for the same.   PET CT scan showed focal hypermetabolic activity in the right breast adjacent to the biopsy clip but no other additional areas of concern.  Baseline MUGA  scan showed normal EF.   Given that additional biopsies were negative for malignancy and the only biopsy-proven site was 1.6 cm triple negative breast cancer with negative lymph nodes plan was to do [facility] Taxol  chemotherapy neoadjuvant without opting for keynote 522 regimen.    Mild decrease in the size of primary breast mass after 4 cycles of AC chemotherapy. Carboplatin  added to taxol  neoadjuvantly   Patient underwent bilateral mastectomy with reconstruction on 05/08/2023.No malignancy noted in the left breast. She was found to have 0.6 cm invasive ductal carcinoma high-grade in the right breast. There was another area of fibromatoid change noted adjacent to it and the intervening parenchyma between these 2 areas showed an additional 0.4 cm invasive ductal carcinoma 2 sentinel lymph nodes negative for malignancy. No definitive response to presurgical therapy in the invasive carcinoma. Overall cancer cellularity 95%. ypT1b N0.     Interval history-she has mild neuropathy in her bilateral feet.  Denies any new complaints at this time.  Tolerating Xeloda  well  ECOG PS- 0 Pain scale- 0   Review of systems- Review of Systems  Constitutional:  Negative for chills, fever, malaise/fatigue and weight loss.  HENT:  Negative for congestion, ear discharge and nosebleeds.   Eyes:  Negative for blurred vision.  Respiratory:  Negative for cough, hemoptysis, sputum production, shortness of breath and wheezing.   Cardiovascular:  Negative for chest pain, palpitations, orthopnea and claudication.  Gastrointestinal:  Negative for abdominal pain, blood in stool, constipation, diarrhea, heartburn, melena, nausea and vomiting.  Genitourinary:  Negative for dysuria, flank pain, frequency, hematuria and urgency.  Musculoskeletal:  Negative for back pain, joint pain and myalgias.  Skin:  Negative for rash.  Neurological:  Positive for sensory change (Peripheral neuropathy). Negative for dizziness, tingling, focal  weakness, seizures, weakness and headaches.  Endo/Heme/Allergies:  Does not bruise/bleed easily.  Psychiatric/Behavioral:  Negative for depression and suicidal ideas. The patient does not have insomnia.       No Known Allergies   Past Medical History:  Diagnosis Date   Chlamydia 01/25/2010   Depression with anxiety    Gestational hypertension    resolved after delivery   Immunization, viral disease    gardasil series completed   Invasive carcinoma of breast (HCC)    right   Tobacco abuse 05/02/2016     Past Surgical History:  Procedure Laterality Date   AUGMENTATION MAMMAPLASTY Bilateral    saline implants 2015   BREAST BIOPSY Right 10/14/2022   us  biopsy/ coil clip/ path pending   BREAST BIOPSY Right 10/14/2022   US  RT BREAST BX W LOC DEV 1ST LESION IMG BX SPEC US  GUIDE 10/14/2022 [facility]   BREAST BIOPSY Left 10/29/2022   Us  Core Bx Ribbon clip- path pending   BREAST BIOPSY Right 10/29/2022   US  Core 1130 Venus Clip- path pending   BREAST BIOPSY Right 10/29/2022   Us  Core Bx Retroareolar heart clip-path pending   BREAST BIOPSY Right 10/29/2022   Us  Core Bx Ribbon Clip path pending   BREAST BIOPSY Right 10/29/2022   US  RT BREAST BX W LOC DEV EA ADD LESION IMG BX SPEC US  GUIDE 10/29/2022 [facility]   BREAST BIOPSY Right 10/29/2022   US  RT BREAST BX W LOC DEV 1ST LESION IMG BX SPEC US  GUIDE 10/29/2022 [facility]   BREAST BIOPSY Right 10/29/2022   US  RT BREAST BX W LOC DEV EA ADD LESION IMG BX SPEC US  GUIDE 10/29/2022 [facility]   BREAST BIOPSY Left 10/29/2022   US  LT BREAST BX W LOC DEV 1ST LESION IMG BX SPEC US  GUIDE 10/29/2022 [facility]   BREAST ENHANCEMENT SURGERY  2015   BREAST RECONSTRUCTION WITH PLACEMENT OF TISSUE EXPANDER AND FLEX HD (ACELLULAR HYDRATED DERMIS) Bilateral 05/08/2023   Procedure: Bilateral immediate breast reconstruction with expanders and Flex HD placement;  Surgeon: Lowery Estefana RAMAN, DO;  Location: [facility];   Service: Plastics;  Laterality: Bilateral;   CESAREAN SECTION  2014   MASTECTOMY W/ SENTINEL NODE BIOPSY Bilateral 05/08/2023   Procedure: MASTECTOMY Simple WITH SENTINEL LYMPH NODE BIOPSY, RNFA to assist;  Surgeon: Jordis Laneta FALCON, MD;  Location: [facility];  Service: General;  Laterality: Bilateral;   PORT-A-CATH REMOVAL  04/2023   PORTACATH PLACEMENT N/A 10/24/2022   PortA Cath removed; Procedure: INSERTION PORT-A-CATH;  Surgeon: Jordis Laneta FALCON, MD;  Location: [facility];  Service: General;  Laterality: N/A;   REMOVAL OF BILATERAL TISSUE EXPANDERS WITH PLACEMENT OF BILATERAL BREAST IMPLANTS Bilateral 09/08/2023   Procedure: REMOVAL, TISSUE EXPANDER, BREAST, BILATERAL, WITH BILATERAL IMPLANT IMPLANT INSERTION;  Surgeon: Lowery Estefana RAMAN, DO;  Location: [facility];  Service: Plastics;  Laterality: Bilateral;   TONSILLECTOMY      Social History   Socioeconomic History   Marital status: Single    Spouse name: Not on file   Number of children: 1   Years of education: 15   Highest education level: Not on file  Occupational History   Occupation: Groomer    Comment: [employer]  Tobacco Use   Smoking status: Former    Current  packs/day: 0.00    Types: Cigarettes    Quit date: 07/25/2018    Years since quitting: 5.4    Passive exposure: Past   Smokeless tobacco: Never   Tobacco comments:    quit 07/2018  Vaping Use   Vaping status: Former   Quit date: 07/25/2022  Substance and Sexual Activity   Alcohol use: No    Alcohol/week: 0.0 standard drinks of alcohol   Drug use: No   Sexual activity: Yes    Birth control/protection: None  Other Topics Concern   Not on file  Social History Narrative   Not on file   Social Drivers of Health   Financial Resource Strain: Not on file  Food Insecurity: No Food Insecurity (05/08/2023)   Hunger Vital Sign    Worried About Running Out of Food in the Last Year: Never true    Ran Out of Food in the Last Year: Never true  Transportation Needs: No  Transportation Needs (05/08/2023)   PRAPARE - Administrator, Civil Service (Medical): No    Lack of Transportation (Non-Medical): No  Physical Activity: Not on file  Stress: Not on file  Social Connections: Not on file  Intimate Partner Violence: Not At Risk (05/08/2023)   Humiliation, Afraid, Rape, and Kick questionnaire    Fear of Current or Ex-Partner: No    Emotionally Abused: No    Physically Abused: No    Sexually Abused: No    Family History  Problem Relation Age of Onset   Breast cancer Other 35       mat 2nd cousin   Diabetes Neg Hx    Heart disease Neg Hx    Hypertension Neg Hx    Ovarian cancer Neg Hx    Colon cancer Neg Hx      Current Outpatient Medications:    acetaminophen  (TYLENOL ) 500 MG tablet, Take 1,000 mg by mouth every 6 (six) hours as needed for moderate pain (pain score 4-6). (Patient not taking: Reported on 12/11/2023), Disp: , Rfl:    capecitabine  (XELODA ) 500 MG tablet, Take 3 tablets (1,500 mg total) by mouth 2 (two) times daily after a meal. Take for 14 days, then hold for 7 days. Repeat every 21 days., Disp: 84 tablet, Rfl: 1   diazepam  (VALIUM ) 2 MG tablet, Take 1 tablet (2 mg total) by mouth every 12 (twelve) hours as needed for muscle spasms. (Patient not taking: Reported on 12/11/2023), Disp: 5 tablet, Rfl: 0   diclofenac  Sodium (VOLTAREN ) 1 % GEL, Research Patient: Apply 0.5 grams (1 fingertip) to each hand and each foot twice daily for up to 12 weeks, Disp: 400 g, Rfl: 0   gabapentin  (NEURONTIN ) 300 MG capsule, Take 1 capsule (300 mg total) by mouth 3 (three) times daily. (Patient not taking: Reported on 12/11/2023), Disp: 90 capsule, Rfl: 3   ibuprofen (ADVIL) 200 MG tablet, Take 600-800 mg by mouth every 6 (six) hours as needed for moderate pain (pain score 4-6)., Disp: , Rfl:    ondansetron  (ZOFRAN ) 4 MG tablet, Take 1 tablet (4 mg total) by mouth every 8 (eight) hours as needed for nausea or vomiting., Disp: 60 tablet, Rfl: 2    ondansetron  (ZOFRAN ) 4 MG tablet, Take 1 tablet (4 mg total) by mouth every 8 (eight) hours as needed for nausea or vomiting., Disp: 20 tablet, Rfl: 0   oxyCODONE  (OXY IR/ROXICODONE ) 5 MG immediate release tablet, Take 1 tablet (5 mg total) by mouth every 6 (six) hours as  needed for up to 10 doses for severe pain (pain score 7-10). (Patient not taking: Reported on 12/11/2023), Disp: 10 tablet, Rfl: 0  Physical exam:  Vitals:   12/31/23 0916  BP: 102/74  Pulse: 73  Resp: 20  Temp: 97.7 F (36.5 C)  TempSrc: Tympanic  Weight: 117 lb 4.8 oz (53.2 kg)   Physical Exam Cardiovascular:     Rate and Rhythm: Normal rate and regular rhythm.     Heart sounds: Normal heart sounds.  Pulmonary:     Effort: Pulmonary effort is normal.     Breath sounds: Normal breath sounds.  Skin:    General: Skin is warm and dry.  Neurological:     Mental Status: She is alert and oriented to person, place, and time.      I have personally reviewed labs listed below:    Latest Ref Rng & Units 12/31/2023    8:52 AM  CMP  Glucose 70 - 99 mg/dL 93   BUN 6 - 20 mg/dL 10   Creatinine 9.55 - 1.00 mg/dL 9.33   Sodium 864 - 854 mmol/L 135   Potassium 3.5 - 5.1 mmol/L 3.9   Chloride 98 - 111 mmol/L 106   CO2 22 - 32 mmol/L 23   Calcium 8.9 - 10.3 mg/dL 8.8   Total Protein 6.5 - 8.1 g/dL 7.0   Total Bilirubin 0.0 - 1.2 mg/dL 1.0   Alkaline Phos 38 - 126 U/L 66   AST 15 - 41 U/L 17   ALT 0 - 44 U/L 13       Latest Ref Rng & Units 12/31/2023    8:52 AM  CBC  WBC 4.0 - 10.5 K/uL 4.3   Hemoglobin 12.0 - 15.0 g/dL 87.5   Hematocrit 63.9 - 46.0 % 35.5   Platelets 150 - 400 K/uL 175       Assessment and plan- Patient is a 33 y.o. female with history of stage Ib invasive mammary carcinoma of the right breast cT1 cN0 M0 triple negative status post neoadjuvant AC CarboTaxol chemotherapy. She had 2 foci of residual disease 0.6 and 0.4 cm after neoadjuvant chemotherapy.  She is here for on treatment assessment  prior to cycle 8 of adjuvant Xeloda   Counts okay to proceed with cycle 8 of adjuvant Xeloda  which would be her last cycle.  I will see her back in 3 months for routine exam.  She is s/p bilateral mastectomy and therefore no role for surveillance mammograms.  She has triple negative disease and therefore no role for endocrine therapy.  There would be also no role for routine surveillance imaging or tumor markers at this time.  Chemo-induced peripheral neuropathy: Overall mild.  Continue to monitor   Visit Diagnosis 1. Malignant neoplasm of upper-outer quadrant of right breast in female, estrogen receptor negative (HCC)   2. High risk medication use   3. Chemotherapy-induced peripheral neuropathy (HCC)      Dr. Annah Skene, MD, MPH [facility] at [facility] [phone] 12/31/2023 7:46 AM

## 2024-01-01 ENCOUNTER — Encounter: Payer: Self-pay | Admitting: *Deleted

## 2024-01-13 NOTE — Progress Notes (Unsigned)
 PCP:  Pcp, No   No chief complaint on file.    HPI:      [name] is a 33 y.o. G2P1011 whose LMP was No LMP recorded. (Menstrual status: Chemotherapy)., presents today for her NP> 3 yrs annual examination.  Her menses are {norm/abn:715}, lasting {number: 22536} days.  Dysmenorrhea {dysmen:716}. She {does:18564} have intermenstrual bleeding.   Sex activity: {sex active: 315163}.  Last Pap: 05/04/18 Results were: no abnormalities /neg HPV DNA  Hx of STDs: {STD hx:14358}  There is a FH of breast cancer in her mat 2nd cousin. There is no FH of ovarian cancer. The patient {does:18564} do self-breast exams.Diagnosis- clinical prognostic stage Ib invasive mammary carcinoma of the right breast cT1 cN0 M0 triple negative  S/p bilat mastectomy, currently on chemo, followed by oncology.   Tobacco use: {tob:20664} Alcohol use: {Alcohol:11675} No drug use.  Exercise: {exercise:31265}  She {does:18564} get adequate calcium and Vitamin D in her diet.  Patient Active Problem List   Diagnosis Date Noted   S/P bilateral mastectomy 05/08/2023   Genetic testing 10/30/2022   Invasive carcinoma of breast (HCC) 10/22/2022   Malignant neoplasm of upper-outer quadrant of right breast in female, estrogen receptor negative (HCC) 10/22/2022   Lump of right breast 09/18/2022   Weight gain 12/04/2020   Right wrist pain 09/27/2019   Contraceptive management 07/07/2019   Anxiety and depression 12/29/2015    Past Surgical History:  Procedure Laterality Date   AUGMENTATION MAMMAPLASTY Bilateral    saline implants 2015   BREAST BIOPSY Right 10/14/2022   us  biopsy/ coil clip/ path pending   BREAST BIOPSY Right 10/14/2022   US  RT BREAST BX W LOC DEV 1ST LESION IMG BX SPEC US  GUIDE 10/14/2022 [facility]   BREAST BIOPSY Left 10/29/2022   Us  Core Bx Ribbon clip- path pending   BREAST BIOPSY Right 10/29/2022   US  Core 1130 Venus Clip- path pending   BREAST BIOPSY Right 10/29/2022   Us   Core Bx Retroareolar heart clip-path pending   BREAST BIOPSY Right 10/29/2022   Us  Core Bx Ribbon Clip path pending   BREAST BIOPSY Right 10/29/2022   US  RT BREAST BX W LOC DEV EA ADD LESION IMG BX SPEC US  GUIDE 10/29/2022 [facility]   BREAST BIOPSY Right 10/29/2022   US  RT BREAST BX W LOC DEV 1ST LESION IMG BX SPEC US  GUIDE 10/29/2022 [facility]   BREAST BIOPSY Right 10/29/2022   US  RT BREAST BX W LOC DEV EA ADD LESION IMG BX SPEC US  GUIDE 10/29/2022 [facility]   BREAST BIOPSY Left 10/29/2022   US  LT BREAST BX W LOC DEV 1ST LESION IMG BX SPEC US  GUIDE 10/29/2022 [facility]   BREAST ENHANCEMENT SURGERY  2015   BREAST RECONSTRUCTION WITH PLACEMENT OF TISSUE EXPANDER AND FLEX HD (ACELLULAR HYDRATED DERMIS) Bilateral 05/08/2023   Procedure: Bilateral immediate breast reconstruction with expanders and Flex HD placement;  Surgeon: Lowery Estefana RAMAN, DO;  Location: [facility];  Service: Plastics;  Laterality: Bilateral;   CESAREAN SECTION  2014   MASTECTOMY W/ SENTINEL NODE BIOPSY Bilateral 05/08/2023   Procedure: MASTECTOMY Simple WITH SENTINEL LYMPH NODE BIOPSY, RNFA to assist;  Surgeon: Jordis Laneta FALCON, MD;  Location: [facility];  Service: General;  Laterality: Bilateral;   PORT-A-CATH REMOVAL  04/2023   PORTACATH PLACEMENT N/A 10/24/2022   PortA Cath removed; Procedure: INSERTION PORT-A-CATH;  Surgeon: Jordis Laneta FALCON, MD;  Location: [facility];  Service: General;  Laterality: N/A;   REMOVAL OF BILATERAL TISSUE EXPANDERS  WITH PLACEMENT OF BILATERAL BREAST IMPLANTS Bilateral 09/08/2023   Procedure: REMOVAL, TISSUE EXPANDER, BREAST, BILATERAL, WITH BILATERAL IMPLANT IMPLANT INSERTION;  Surgeon: Lowery Estefana RAMAN, DO;  Location: [facility];  Service: Plastics;  Laterality: Bilateral;   TONSILLECTOMY      Family History  Problem Relation Age of Onset   Breast cancer Other 35       mat 2nd cousin   Diabetes Neg Hx    Heart disease Neg Hx    Hypertension Neg Hx    Ovarian cancer Neg  Hx    Colon cancer Neg Hx     Social History   Socioeconomic History   Marital status: Single    Spouse name: Not on file   Number of children: 1   Years of education: 15   Highest education level: Not on file  Occupational History   Occupation: Groomer    Comment: [employer]  Tobacco Use   Smoking status: Former    Current packs/day: 0.00    Types: Cigarettes    Quit date: 07/25/2018    Years since quitting: 5.4    Passive exposure: Past   Smokeless tobacco: Never   Tobacco comments:    quit 07/2018  Vaping Use   Vaping status: Former   Quit date: 07/25/2022  Substance and Sexual Activity   Alcohol use: No    Alcohol/week: 0.0 standard drinks of alcohol   Drug use: No   Sexual activity: Yes    Birth control/protection: None  Other Topics Concern   Not on file  Social History Narrative   Not on file   Social Drivers of Health   Financial Resource Strain: Not on file  Food Insecurity: No Food Insecurity (05/08/2023)   Hunger Vital Sign    Worried About Running Out of Food in the Last Year: Never true    Ran Out of Food in the Last Year: Never true  Transportation Needs: No Transportation Needs (05/08/2023)   PRAPARE - Administrator, Civil Service (Medical): No    Lack of Transportation (Non-Medical): No  Physical Activity: Not on file  Stress: Not on file  Social Connections: Not on file  Intimate Partner Violence: Not At Risk (05/08/2023)   Humiliation, Afraid, Rape, and Kick questionnaire    Fear of Current or Ex-Partner: No    Emotionally Abused: No    Physically Abused: No    Sexually Abused: No     Current Outpatient Medications:    acetaminophen  (TYLENOL ) 500 MG tablet, Take 1,000 mg by mouth every 6 (six) hours as needed for moderate pain (pain score 4-6)., Disp: , Rfl:    capecitabine  (XELODA ) 500 MG tablet, Take 3 tablets (1,500 mg total) by mouth 2 (two) times daily after a meal. Take for 14 days, then hold for 7 days. Repeat  every 21 days., Disp: 84 tablet, Rfl: 1   diazepam  (VALIUM ) 2 MG tablet, Take 1 tablet (2 mg total) by mouth every 12 (twelve) hours as needed for muscle spasms. (Patient not taking: Reported on 12/11/2023), Disp: 5 tablet, Rfl: 0   diclofenac  Sodium (VOLTAREN ) 1 % GEL, Research Patient: Apply 0.5 grams (1 fingertip) to each hand and each foot twice daily for up to 12 weeks, Disp: 400 g, Rfl: 0   gabapentin  (NEURONTIN ) 300 MG capsule, Take 1 capsule (300 mg total) by mouth 3 (three) times daily. (Patient not taking: Reported on 12/11/2023), Disp: 90 capsule, Rfl: 3   ibuprofen (ADVIL) 200 MG tablet, Take  600-800 mg by mouth every 6 (six) hours as needed for moderate pain (pain score 4-6)., Disp: , Rfl:    ondansetron  (ZOFRAN ) 4 MG tablet, Take 1 tablet (4 mg total) by mouth every 8 (eight) hours as needed for nausea or vomiting., Disp: 60 tablet, Rfl: 2   ondansetron  (ZOFRAN ) 4 MG tablet, Take 1 tablet (4 mg total) by mouth every 8 (eight) hours as needed for nausea or vomiting., Disp: 20 tablet, Rfl: 0   oxyCODONE  (OXY IR/ROXICODONE ) 5 MG immediate release tablet, Take 1 tablet (5 mg total) by mouth every 6 (six) hours as needed for up to 10 doses for severe pain (pain score 7-10). (Patient not taking: Reported on 12/31/2023), Disp: 10 tablet, Rfl: 0     ROS:  Review of Systems BREAST: No symptoms   Objective: There were no vitals taken for this visit.   OBGyn Exam  Results: No results found for this or any previous visit (from the past 24 hours).  Assessment/Plan: No diagnosis found.  No orders of the defined types were placed in this encounter.            GYN counsel {counseling: 16159}     F/U  No follow-ups on file.  Amran Malter B. Derrian Poli, PA-C 01/13/2024 4:37 PM

## 2024-01-15 ENCOUNTER — Other Ambulatory Visit (HOSPITAL_COMMUNITY)
Admission: RE | Admit: 2024-01-15 | Discharge: 2024-01-15 | Disposition: A | Discharge status: Home / Self Care | Source: Ambulatory Visit | Attending: Obstetrics and Gynecology | Admitting: Obstetrics and Gynecology

## 2024-01-15 ENCOUNTER — Ambulatory Visit (INDEPENDENT_AMBULATORY_CARE_PROVIDER_SITE_OTHER): Admitting: Obstetrics and Gynecology

## 2024-01-15 ENCOUNTER — Ambulatory Visit: Admitting: Student

## 2024-01-15 ENCOUNTER — Other Ambulatory Visit: Payer: Self-pay | Admitting: Student

## 2024-01-15 ENCOUNTER — Encounter: Payer: Self-pay | Admitting: Obstetrics and Gynecology

## 2024-01-15 VITALS — BP 96/65 | HR 103 | Ht 64.0 in | Wt 117.0 lb

## 2024-01-15 VITALS — BP 112/79 | HR 88

## 2024-01-15 DIAGNOSIS — N6459 Other signs and symptoms in breast: Secondary | ICD-10-CM

## 2024-01-15 DIAGNOSIS — Z124 Encounter for screening for malignant neoplasm of cervix: Secondary | ICD-10-CM | POA: Insufficient documentation

## 2024-01-15 DIAGNOSIS — Z853 Personal history of malignant neoplasm of breast: Secondary | ICD-10-CM | POA: Diagnosis not present

## 2024-01-15 DIAGNOSIS — Z1151 Encounter for screening for human papillomavirus (HPV): Secondary | ICD-10-CM | POA: Insufficient documentation

## 2024-01-15 DIAGNOSIS — Z171 Estrogen receptor negative status [ER-]: Secondary | ICD-10-CM

## 2024-01-15 DIAGNOSIS — Z9013 Acquired absence of bilateral breasts and nipples: Secondary | ICD-10-CM | POA: Diagnosis not present

## 2024-01-15 DIAGNOSIS — Z01419 Encounter for gynecological examination (general) (routine) without abnormal findings: Secondary | ICD-10-CM

## 2024-01-15 DIAGNOSIS — C50411 Malignant neoplasm of upper-outer quadrant of right female breast: Secondary | ICD-10-CM

## 2024-01-15 DIAGNOSIS — Z9889 Other specified postprocedural states: Secondary | ICD-10-CM

## 2024-01-15 NOTE — Patient Instructions (Signed)
 I value your feedback and you entrusting [place] with your care. If you get a [place] patient survey, I would appreciate you taking the time to let us know about your experience today. Thank you! ? ? ?

## 2024-01-15 NOTE — Progress Notes (Signed)
   Referring Provider No referring provider defined for this encounter.   CC:  Chief Complaint  Patient presents with   Follow-up      Eileen Hart is an 33 y.o. female.  HPI: Patient is a 33 year old female with history of right breast cancer status post bilateral breast reconstruction.  She had underwent bilateral exchange of tissue expanders for implants and bilateral capsulotomies for implant repositioning with Dr. Lowery on 09/08/2023.  Patient had Mentor smooth round ultrahigh profile gel 400 cc implants placed bilaterally.  She presents to the clinic today for further follow-up.  Patient was last seen in the clinic on 10/17/2023.  At this visit, patient was doing well.  There was a little bit of firmness to her right lateral breast.  It was recommended that patient follow back up in a few months for reevaluation.  Today, patient reports she is doing well.  She states that the little bit of firmness to her right breast is still present, but states that she does not think it has changed in size.  She states that she feels PT and OT have been also helping with things.  Patient also states that she feels she has some volume  deficit to the superior poles of her breasts.  She was inquiring if anything could be done about that.  Otherwise patient denies any new issues or concerns.  She denies any fevers or chills.   Review of Systems General: Denies any fevers or chills  Physical Exam    01/15/2024   10:20 AM 12/31/2023    9:16 AM 12/03/2023   11:50 AM  Vitals with BMI  Height   5' 4  Weight  117 lbs 5 oz 115 lbs 6 oz  BMI  20.12 19.8  Systolic 112 102 872  Diastolic 79 74 93  Pulse 88 73 96    General:  No acute distress,  Alert and oriented, Non-Toxic, Normal speech and affect Chaperone present on exam.  On exam, patient is sitting upright in no acute distress.  Overall, breasts are fairly soft and symmetric.  Implants are in place.  There is still a little bit of firmness to  the lateral right breast.  There is no tenderness to palpation.  No overlying skin changes.  Incisions appear to be well-healed.  There are no signs of infection on exam.  Assessment/Plan S/P breast reconstruction - Plan: US  BREAST COMPLETE UNI RIGHT INC AXILLA   Given history of right breast cancer and persistent firmness to the right breast, I recommended we have the patient get an ultrasound of the area.  She expressed understanding was in agreement with this.  In terms of her concerns with volume deficit, I recommended that she follow-up with Dr. Lowery to discuss possibility of fat grafting.  Patient expressed understanding and was in agreement with this.  Patient to follow-up with a telephone visit to discuss results of the ultrasound.  Otherwise patient to follow-up with Dr. Lowery.  I instructed her to call in the meantime if she has any questions or concerns about anything.  Pictures were obtained of the patient and placed in the chart with the patient's or guardian's permission.   Eileen Hart 01/15/2024, 11:51 AM

## 2024-01-16 ENCOUNTER — Other Ambulatory Visit: Payer: Self-pay

## 2024-01-16 NOTE — Progress Notes (Signed)
 Therapy completed patient will be disenrolled.

## 2024-01-20 NOTE — Addendum Note (Signed)
 Addended by: DEVONDA ISAIAH SAILOR on: 01/20/2024 03:35 PM   Modules accepted: Orders

## 2024-01-26 ENCOUNTER — Ambulatory Visit
Admission: RE | Admit: 2024-01-26 | Discharge: 2024-01-26 | Disposition: A | Discharge status: Home / Self Care | Source: Ambulatory Visit | Attending: Student | Admitting: Student

## 2024-01-26 ENCOUNTER — Other Ambulatory Visit: Payer: Self-pay | Admitting: Student

## 2024-01-26 DIAGNOSIS — R928 Other abnormal and inconclusive findings on diagnostic imaging of breast: Secondary | ICD-10-CM

## 2024-01-26 DIAGNOSIS — Z9889 Other specified postprocedural states: Secondary | ICD-10-CM | POA: Insufficient documentation

## 2024-01-27 ENCOUNTER — Ambulatory Visit: Payer: Self-pay | Admitting: Student

## 2024-01-27 LAB — CYTOLOGY - PAP
Comment: NEGATIVE
Diagnosis: NEGATIVE
Diagnosis: REACTIVE
High risk HPV: NEGATIVE

## 2024-01-27 NOTE — Telephone Encounter (Signed)
 I called the patient and discussed the results of her ultrasound with her.  She states that she is already scheduled for her biopsy for tomorrow.  All of her questions were answered in regards to her ultrasound results and next steps.

## 2024-01-28 ENCOUNTER — Ambulatory Visit
Admission: RE | Admit: 2024-01-28 | Discharge: 2024-01-28 | Disposition: A | Discharge status: Home / Self Care | Source: Ambulatory Visit | Attending: Student | Admitting: Student

## 2024-01-28 DIAGNOSIS — R928 Other abnormal and inconclusive findings on diagnostic imaging of breast: Secondary | ICD-10-CM | POA: Insufficient documentation

## 2024-01-28 DIAGNOSIS — N6031 Fibrosclerosis of right breast: Secondary | ICD-10-CM | POA: Insufficient documentation

## 2024-01-28 HISTORY — PX: BREAST BIOPSY: SHX20

## 2024-01-28 MED ORDER — LIDOCAINE-EPINEPHRINE 1 %-1:100000 IJ SOLN
5.0000 mL | Freq: Once | INTRAMUSCULAR | Status: AC
  Administered 2024-01-28: 5 mL

## 2024-01-28 MED ORDER — LIDOCAINE 1 % OPTIME INJ - NO CHARGE
2.0000 mL | Freq: Once | INTRAMUSCULAR | Status: AC
  Administered 2024-01-28: 2 mL
  Filled 2024-01-28: qty 2

## 2024-01-29 ENCOUNTER — Telehealth: Admitting: Student

## 2024-01-29 LAB — SURGICAL PATHOLOGY

## 2024-02-02 ENCOUNTER — Ambulatory Visit (INDEPENDENT_AMBULATORY_CARE_PROVIDER_SITE_OTHER): Admitting: Student

## 2024-02-02 ENCOUNTER — Ambulatory Visit: Payer: Self-pay | Admitting: Student

## 2024-02-02 DIAGNOSIS — Z9889 Other specified postprocedural states: Secondary | ICD-10-CM

## 2024-02-02 NOTE — Telephone Encounter (Signed)
 I attempted to call patient to discuss results, she did not answer. LVM to return call

## 2024-02-02 NOTE — Progress Notes (Signed)
   Referring Provider No referring provider defined for this encounter.   CC: No chief complaint on file.     Eileen Hart is an 33 y.o. female.  HPI: Patient is a 33 year old female with history of right breast cancer status post bilateral breast reconstruction.  She had underwent bilateral exchange of tissue expanders for implants and bilateral capsulotomies for implant repositioning with Dr. Lowery on 09/08/2023.  Patient had Mentor smooth round ultrahigh profile gel 400 cc implants placed bilaterally.  She presents for telephone visit today to discuss results of her recent biopsy.  Patient was last seen in the clinic on 01/15/2024.  At this visit, patient was overall doing well.  Implants were in place.  There was still little bit of firmness noted to the right lateral breast.  Ultrasound was ordered of this area.  Ultrasound was completed at 01/26/2024.  There was a lobulated 3.5 cm masslike area which possibly could have reflected acellular dermal matrix.  Patient then underwent clip placement and biopsy of the area on 01/28/2024.  Pathology of the biopsy showed benign fibrous tissue with dense fibrosis and calcifications, focal fat necrosis with micro calcifications, biopsy was negative for atypia or malignancy.  I attempted to call the patient twice, she did not answer.  I left a voicemail for her to return my call.

## 2024-02-19 ENCOUNTER — Ambulatory Visit (INDEPENDENT_AMBULATORY_CARE_PROVIDER_SITE_OTHER): Admitting: Internal Medicine

## 2024-02-19 VITALS — BP 100/64 | HR 88 | Resp 16 | Ht 64.0 in | Wt 117.0 lb

## 2024-02-19 DIAGNOSIS — M25531 Pain in right wrist: Secondary | ICD-10-CM | POA: Diagnosis not present

## 2024-02-19 DIAGNOSIS — G620 Drug-induced polyneuropathy: Secondary | ICD-10-CM

## 2024-02-19 DIAGNOSIS — Z853 Personal history of malignant neoplasm of breast: Secondary | ICD-10-CM

## 2024-02-19 DIAGNOSIS — H9190 Unspecified hearing loss, unspecified ear: Secondary | ICD-10-CM

## 2024-02-19 DIAGNOSIS — T451X5A Adverse effect of antineoplastic and immunosuppressive drugs, initial encounter: Secondary | ICD-10-CM

## 2024-02-19 NOTE — Progress Notes (Signed)
 Subjective:    Patient ID: Eileen Hart, female    DOB: 1990/11/08, 33 y.o.   MRN: [id number]  Patient here for  Chief Complaint  Patient presents with   Establish Care    HPI Here to establish care. Sees gyn for gyn care. Evaluated 01/15/24 - pap ok. Has a history of breast cancer. S/p bilateral mastectomy with reconstruction. Received chemo. Completed one month ago. Followed by oncology. Patient was last seen in the clinic on 01/15/2024. At this visit, patient was overall doing well. Implants were in place. There was still little bit of firmness noted to the right lateral breast. Ultrasound was ordered of this area. Ultrasound was completed at 01/26/2024. There was a lobulated 3.5 cm masslike area which possibly could have reflected acellular dermal matrix. Patient then underwent clip placement and biopsy of the area on 01/28/2024. Pathology of the biopsy showed benign fibrous tissue with dense fibrosis and calcifications, focal fat necrosis with micro calcifications, biopsy was negative for atypia or malignancy. Is doing well. Stays active. Does report persistent right wrist pain. Has been present for a while - prior to her breast cancer diagnosis. 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 Discussed referral back to ortho. Has seen ortho previously and has worn splints. S/p injection. Did not help. Has neuropathy - from chemo. Involving feet. Is some better. Also reports decreased hearing. Persistent - noticed at least 18 months ago. Discussed hearing evaluation. Now back at work 3 days per week. Up to date with immunizations. Did have a period in June and July 2025. Prior to that - 10/2022.    Past Medical History:  Diagnosis Date   Chlamydia 01/25/2010   Depression with anxiety    Gestational hypertension    resolved after delivery   Immunization, viral disease    gardasil series completed   Invasive carcinoma of breast (HCC)    right   Tobacco abuse 05/02/2016   Past Surgical History:   Procedure Laterality Date   AUGMENTATION MAMMAPLASTY Bilateral    saline implants 2015   BREAST BIOPSY Right 10/14/2022   us  biopsy/ coil clip/ path pending   BREAST BIOPSY Right 10/14/2022   US  RT BREAST BX W LOC DEV 1ST LESION IMG BX SPEC US  GUIDE 10/14/2022 [facility]   BREAST BIOPSY Left 10/29/2022   Us  Core Bx Ribbon clip- path pending   BREAST BIOPSY Right 10/29/2022   US  Core 1130 Venus Clip- path pending   BREAST BIOPSY Right 10/29/2022   Us  Core Bx Retroareolar heart clip-path pending   BREAST BIOPSY Right 10/29/2022   Us  Core Bx Ribbon Clip path pending   BREAST BIOPSY Right 10/29/2022   US  RT BREAST BX W LOC DEV EA ADD LESION IMG BX SPEC US  GUIDE 10/29/2022 [facility]   BREAST BIOPSY Right 10/29/2022   US  RT BREAST BX W LOC DEV 1ST LESION IMG BX SPEC US  GUIDE 10/29/2022 [facility]   BREAST BIOPSY Right 10/29/2022   US  RT BREAST BX W LOC DEV EA ADD LESION IMG BX SPEC US  GUIDE 10/29/2022 [facility]   BREAST BIOPSY Left 10/29/2022   US  LT BREAST BX W LOC DEV 1ST LESION IMG BX SPEC US  GUIDE 10/29/2022 [facility]   BREAST BIOPSY Right 01/28/2024   US  RT BREAST BX W LOC DEV 1ST LESION IMG BX SPEC US  GUIDE 01/28/2024 [facility]   BREAST ENHANCEMENT SURGERY  2015   BREAST RECONSTRUCTION WITH PLACEMENT OF TISSUE EXPANDER AND FLEX HD (ACELLULAR HYDRATED DERMIS) Bilateral 05/08/2023  Procedure: Bilateral immediate breast reconstruction with expanders and Flex HD placement;  Surgeon: Lowery Estefana RAMAN, DO;  Location: [facility];  Service: Plastics;  Laterality: Bilateral;   CESAREAN SECTION  2014   MASTECTOMY W/ SENTINEL NODE BIOPSY Bilateral 05/08/2023   Procedure: MASTECTOMY Simple WITH SENTINEL LYMPH NODE BIOPSY, RNFA to assist;  Surgeon: Jordis Laneta FALCON, MD;  Location: [facility];  Service: General;  Laterality: Bilateral;   PORT-A-CATH REMOVAL  04/2023   PORTACATH PLACEMENT N/A 10/24/2022   PortA Cath removed; Procedure: INSERTION PORT-A-CATH;  Surgeon:  Jordis Laneta FALCON, MD;  Location: [facility];  Service: General;  Laterality: N/A;   REMOVAL OF BILATERAL TISSUE EXPANDERS WITH PLACEMENT OF BILATERAL BREAST IMPLANTS Bilateral 09/08/2023   Procedure: REMOVAL, TISSUE EXPANDER, BREAST, BILATERAL, WITH BILATERAL IMPLANT IMPLANT INSERTION;  Surgeon: Lowery Estefana RAMAN, DO;  Location: [facility];  Service: Plastics;  Laterality: Bilateral;   TONSILLECTOMY     Family History  Problem Relation Age of Onset   Breast cancer Other 35       mat 2nd cousin   Diabetes Neg Hx    Heart disease Neg Hx    Hypertension Neg Hx    Ovarian cancer Neg Hx    Colon cancer Neg Hx    Social History   Socioeconomic History   Marital status: Single    Spouse name: Not on file   Number of children: 1   Years of education: 15   Highest education level: Not on file  Occupational History   Occupation: Groomer    Comment: [employer]  Tobacco Use   Smoking status: Former    Current packs/day: 0.00    Types: Cigarettes    Quit date: 07/25/2018    Years since quitting: 5.6    Passive exposure: Past   Smokeless tobacco: Never   Tobacco comments:    quit 07/2018  Vaping Use   Vaping status: Former   Quit date: 07/25/2022  Substance and Sexual Activity   Alcohol use: No    Alcohol/week: 0.0 standard drinks of alcohol   Drug use: No   Sexual activity: Yes    Birth control/protection: None  Other Topics Concern   Not on file  Social History Narrative   Not on file   Social Drivers of Health   Financial Resource Strain: Not on file  Food Insecurity: No Food Insecurity (05/08/2023)   Hunger Vital Sign    Worried About Running Out of Food in the Last Year: Never true    Ran Out of Food in the Last Year: Never true  Transportation Needs: No Transportation Needs (05/08/2023)   PRAPARE - Administrator, Civil Service (Medical): No    Lack of Transportation (Non-Medical): No  Physical Activity: Not on file  Stress: Not on file  Social  Connections: Not on file     Review of Systems  Constitutional:  Negative for appetite change and unexpected weight change.  HENT:  Positive for hearing loss. Negative for congestion and sinus pressure.   Respiratory:  Negative for cough, chest tightness and shortness of breath.   Cardiovascular:  Negative for chest pain, palpitations and leg swelling.  Gastrointestinal:  Negative for abdominal pain, diarrhea, nausea and vomiting.  Genitourinary:  Negative for difficulty urinating and dysuria.  Musculoskeletal:  Negative for myalgias.       Right wrist pain.   Skin:  Negative for color change and rash.  Neurological:  Negative for dizziness and headaches.  Psychiatric/Behavioral:  Negative  for agitation and dysphoric mood.        Objective:     BP 100/64   Pulse 88   Resp 16   Ht 5' 4 (1.626 m)   Wt 117 lb (53.1 kg)   SpO2 99%   BMI 20.08 kg/m  Wt Readings from Last 3 Encounters:  02/19/24 117 lb (53.1 kg)  01/15/24 117 lb (53.1 kg)  12/31/23 117 lb 4.8 oz (53.2 kg)    Physical Exam Vitals reviewed.  Constitutional:      General: She is not in acute distress.    Appearance: Normal appearance.  HENT:     Head: Normocephalic and atraumatic.     Right Ear: Tympanic membrane, ear canal and external ear normal.     Left Ear: Tympanic membrane, ear canal and external ear normal.     Mouth/Throat:     Pharynx: No oropharyngeal exudate or posterior oropharyngeal erythema.  Eyes:     General: No scleral icterus.       Right eye: No discharge.        Left eye: No discharge.     Conjunctiva/sclera: Conjunctivae normal.  Neck:     Thyroid : No thyromegaly.  Cardiovascular:     Rate and Rhythm: Normal rate and regular rhythm.  Pulmonary:     Effort: No respiratory distress.     Breath sounds: Normal breath sounds. No wheezing.  Abdominal:     General: Bowel sounds are normal.     Palpations: Abdomen is soft.     Tenderness: There is no abdominal tenderness.   Musculoskeletal:        General: No swelling.     Cervical back: Neck supple. No tenderness.     Comments: No tenderness to palpation. Increased pain with resistance with flexion.  Lymphadenopathy:     Cervical: No cervical adenopathy.  Skin:    Findings: No erythema or rash.  Neurological:     Mental Status: She is alert.  Psychiatric:        Mood and Affect: Mood normal.        Behavior: Behavior normal.         Outpatient Encounter Medications as of 02/19/2024  Medication Sig   [DISCONTINUED] acetaminophen  (TYLENOL ) 500 MG tablet Take 1,000 mg by mouth every 6 (six) hours as needed for moderate pain (pain score 4-6).   [DISCONTINUED] capecitabine  (XELODA ) 500 MG tablet Take 3 tablets (1,500 mg total) by mouth 2 (two) times daily after a meal. Take for 14 days, then hold for 7 days. Repeat every 21 days.   [DISCONTINUED] diclofenac  Sodium (VOLTAREN ) 1 % GEL Research Patient: Apply 0.5 grams (1 fingertip) to each hand and each foot twice daily for up to 12 weeks   [DISCONTINUED] gabapentin  (NEURONTIN ) 300 MG capsule Take 1 capsule (300 mg total) by mouth 3 (three) times daily. (Patient not taking: Reported on 01/15/2024)   [DISCONTINUED] ibuprofen (ADVIL) 200 MG tablet Take 600-800 mg by mouth every 6 (six) hours as needed for moderate pain (pain score 4-6).   [DISCONTINUED] ondansetron  (ZOFRAN ) 4 MG tablet Take 1 tablet (4 mg total) by mouth every 8 (eight) hours as needed for nausea or vomiting.   No facility-administered encounter medications on file as of 02/19/2024.     Lab Results  Component Value Date   WBC 4.3 12/31/2023   HGB 12.4 12/31/2023   HCT 35.5 (L) 12/31/2023   PLT 175 12/31/2023   GLUCOSE 93 12/31/2023   CHOL 175 08/25/2019  TRIG 83.0 08/25/2019   HDL 45.60 08/25/2019   LDLCALC 113 (H) 08/25/2019   ALT 13 12/31/2023   AST 17 12/31/2023   NA 135 12/31/2023   K 3.9 12/31/2023   CL 106 12/31/2023   CREATININE 0.66 12/31/2023   BUN 10 12/31/2023   CO2  23 12/31/2023   TSH 0.62 12/04/2020    US  RT BREAST BX W LOC DEV 1ST LESION IMG BX SPEC US  GUIDE Addendum Date: 01/29/2024 ADDENDUM REPORT: 01/29/2024 16:26 ADDENDUM: PATHOLOGY revealed: 1. Breast, right, needle core biopsy, 7:30 10 cmfn, 3.5 cm, heart clip- BENIGN FIBROUS TISSUE WITH DENSE FIBROSIS AND CALCIFICATIONS. FOCAL FAT NECROSIS WITH MICROCALCIFICATIONS. NEGATIVE FOR ATYPIA OR MALIGNANCY. Pathology results are CONCORDANT with imaging findings, per Dr. Norleen Croak. Pathology results and recommendations were discussed with patient via telephone on 01/29/24 by Rock Hover RN. Patient reported biopsy site doing well with no adverse symptoms, and only slight tenderness at the site. Post biopsy care instructions were reviewed, questions were answered and my direct phone number was provided. Patient was instructed to call [facility] for any additional questions or concerns related to biopsy site. RECOMMENDATION: Patient to return in six months for single RIGHT breast ultrasound to ensure stability of post mastectomy palpable area. Otherwise, recommend clinical follow up/management of the palpable area of concern. Patient informed a reminder notice will be sent regarding this appointment and she may need to call mammography site to schedule this appointment. Pathology results reported by Rock Hover RN on 01/29/2024. Electronically Signed   By: Norleen Croak M.D.   On: 01/29/2024 16:26   Result Date: 01/29/2024 CLINICAL DATA:  Palpable mass along the lower outer aspect of the RIGHT breast implant status post mastectomy EXAM: ULTRASOUND GUIDED RIGHT BREAST CORE NEEDLE BIOPSY COMPARISON:  Previous exam(s). PROCEDURE: I met with the patient and we discussed the procedure of ultrasound-guided biopsy, including benefits and alternatives. We discussed the high likelihood of a successful procedure. We discussed the risks of the procedure, including infection, bleeding, tissue injury, clip migration, and  inadequate sampling. Informed written consent was given. The usual time-out protocol was performed immediately prior to the procedure. Lesion quadrant: Lower outer Using sterile technique and 1% lidocaine  and 1% lidocaine  with epinephrine  as local anesthetic, under direct ultrasound visualization, a 14 gauge spring-loaded device was used to perform biopsy of the RIGHT breast mass at 7:30 using a LATERAL approach. At the conclusion of the procedure heart shaped tissue marker clip was deployed into the biopsy cavity. Follow up 2 view mammogram was performed and dictated separately. IMPRESSION: Ultrasound guided biopsy of the RIGHT breast mass at 7:30. No apparent complications. Electronically Signed: By: Norleen Croak M.D. On: 01/28/2024 14:44   MM CLIP PLACEMENT RIGHT Result Date: 01/28/2024 CLINICAL DATA:  Same day RIGHT breast biopsy EXAM: 3D DIAGNOSTIC RIGHT MAMMOGRAM POST ULTRASOUND BIOPSY COMPARISON:  Previous exam(s). ACR Breast Density Category a: The breasts are almost entirely fatty. FINDINGS: 3D Mammographic images were obtained following ultrasound guided biopsy of the RIGHT breast mass at 7:30 status post RIGHT mastectomy and implant reconstruction. The biopsy marking clip is in expected position at the site of biopsy. IMPRESSION: Appropriate positioning of the heart shaped biopsy marking clip at the site of biopsy in the LATERAL RIGHT breast. Final Assessment: Post Procedure Mammograms for Marker Placement Electronically Signed   By: Norleen Croak M.D.   On: 01/28/2024 14:47       Assessment & Plan:  Right wrist pain Assessment & Plan: Persistent. Has  seen ortho previously. Has worn splints. S/p injection. Did not help. 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 Discussed referral back to ortho.   Orders: -     Ambulatory referral to Orthopedic Surgery  Peripheral neuropathy due to chemotherapy [facility]) Assessment & Plan: Stable.    Hearing loss, unspecified hearing loss type, unspecified  laterality Assessment & Plan: Has noticed for a least 18 months. Discussed hearing evaluation. Refer to ENT.   Orders: -     Ambulatory referral to ENT  History of breast cancer Assessment & Plan: Followed by oncology. S/p bilateral mastectomy. Just completed XRT.       Allena Hamilton, MD

## 2024-02-20 ENCOUNTER — Other Ambulatory Visit: Payer: Self-pay

## 2024-02-29 ENCOUNTER — Encounter: Payer: Self-pay | Admitting: Internal Medicine

## 2024-02-29 DIAGNOSIS — H9190 Unspecified hearing loss, unspecified ear: Secondary | ICD-10-CM | POA: Insufficient documentation

## 2024-02-29 DIAGNOSIS — Z853 Personal history of malignant neoplasm of breast: Secondary | ICD-10-CM | POA: Insufficient documentation

## 2024-02-29 DIAGNOSIS — G620 Drug-induced polyneuropathy: Secondary | ICD-10-CM | POA: Insufficient documentation

## 2024-02-29 NOTE — Assessment & Plan Note (Signed)
 Stable

## 2024-02-29 NOTE — Assessment & Plan Note (Signed)
 Has noticed for a least 18 months. Discussed hearing evaluation. Refer to ENT.

## 2024-02-29 NOTE — Assessment & Plan Note (Signed)
 Followed by oncology. S/p bilateral mastectomy. Just completed XRT.

## 2024-02-29 NOTE — Assessment & Plan Note (Signed)
 Persistent. Has seen ortho previously. Has worn splints. S/p injection. Did not help. 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 Discussed referral back to ortho.

## 2024-03-01 ENCOUNTER — Inpatient Hospital Stay: Attending: Oncology | Admitting: *Deleted

## 2024-03-01 DIAGNOSIS — C50411 Malignant neoplasm of upper-outer quadrant of right female breast: Secondary | ICD-10-CM

## 2024-03-01 DIAGNOSIS — Z9189 Other specified personal risk factors, not elsewhere classified: Secondary | ICD-10-CM

## 2024-03-01 NOTE — Progress Notes (Signed)
 SUBJECTIVE: Pt returns for her 3 month L-Dex screen.    PAIN:  Are you having pain? No   SOZO SCREENING: Patient was assessed today using the SOZO machine to determine the lymphedema index score. This was compared to her baseline score. It was determined that she is within the recommended range when compared to her baseline and no further action is needed at this time. She will continue SOZO screenings. These are done every 3 months for 2 years post operatively followed by every 6 months for 2 years, and then annually.     L-DEX FLOWSHEETS                L-DEX LYMPHEDEMA SCREENING    Measurement Type Unilateral     L-DEX MEASUREMENT EXTREMITY Upper Extremity     POSITION  Standing     DOMINANT SIDE Right     At Risk Side Right    BASELINE SCORE (UNILATERAL) 0.1    L-DEX SCORE (UNILATERAL) -2.9    VALUE CHANGE (UNILAT) -3.0

## 2024-03-30 ENCOUNTER — Encounter: Payer: Self-pay | Admitting: Plastic Surgery

## 2024-03-30 ENCOUNTER — Ambulatory Visit: Admitting: Plastic Surgery

## 2024-03-30 VITALS — BP 123/83 | HR 82 | Ht 64.0 in | Wt 114.6 lb

## 2024-03-30 DIAGNOSIS — C50411 Malignant neoplasm of upper-outer quadrant of right female breast: Secondary | ICD-10-CM

## 2024-03-30 DIAGNOSIS — N6459 Other signs and symptoms in breast: Secondary | ICD-10-CM | POA: Diagnosis not present

## 2024-03-30 DIAGNOSIS — Z853 Personal history of malignant neoplasm of breast: Secondary | ICD-10-CM

## 2024-03-30 DIAGNOSIS — Z9013 Acquired absence of bilateral breasts and nipples: Secondary | ICD-10-CM | POA: Diagnosis not present

## 2024-03-30 NOTE — Progress Notes (Signed)
   Subjective:    Patient ID: Eileen Hart, female    DOB: [birth year], 33 y.o.   MRN: [id number]  The patient is a 33 year old female here for follow-up after breast reconstruction.  On March 17 her expanders were removed and implants were placed she has Mentor smooth round ultra high-profile 400 cc implants.  She had an area of concern in her breast and an ultrasound was done which showed fibrosis tissue with dense fibrosis and calcifications and some fat necrosis the biopsy was negative for any kind of malignancy and a clip was placed and this was done in August.  The patient has some tenderness on the lateral aspect of the right breast.  This feels right where the ADM and the implant meet and I can feel both of them.  It is likely where the implant is rubbing over the ribs.  She is overall doing really well and feeling much better since the negative biopsy.      Review of Systems  Constitutional: Negative.   Eyes: Negative.   Respiratory: Negative.    Cardiovascular: Negative.   Gastrointestinal: Negative.   Genitourinary: Negative.        Objective:   Physical Exam Vitals reviewed.  Constitutional:      Appearance: Normal appearance.  HENT:     Head: Atraumatic.  Cardiovascular:     Rate and Rhythm: Normal rate.     Pulses: Normal pulses.  Skin:    Capillary Refill: Capillary refill takes less than 2 seconds.  Neurological:     Mental Status: She is alert and oriented to person, place, and time.  Psychiatric:        Mood and Affect: Mood normal.        Behavior: Behavior normal.        Thought Content: Thought content normal.       Assessment & Plan:     ICD-10-CM   1. Malignant neoplasm of upper-outer quadrant of right breast in female, estrogen receptor negative (HCC)  C50.411    Z17.1       Continue massage and lets let everything settle for a few months if she wants to do fat grafting we can certainly do that for the upper pole fullness and to alleviate some  of the rippling. Pictures were obtained of the patient and placed in the chart with the patient's or guardian's permission.

## 2024-04-01 ENCOUNTER — Other Ambulatory Visit: Payer: Self-pay

## 2024-04-02 ENCOUNTER — Ambulatory Visit: Admitting: Oncology

## 2024-04-05 ENCOUNTER — Encounter: Payer: Self-pay | Admitting: Oncology

## 2024-04-05 ENCOUNTER — Other Ambulatory Visit: Payer: Self-pay

## 2024-04-05 ENCOUNTER — Inpatient Hospital Stay: Attending: Oncology | Admitting: Oncology

## 2024-04-05 VITALS — BP 123/80 | HR 97 | Temp 98.1°F | Resp 17 | Ht 64.0 in | Wt 115.7 lb

## 2024-04-05 DIAGNOSIS — Z853 Personal history of malignant neoplasm of breast: Secondary | ICD-10-CM | POA: Diagnosis not present

## 2024-04-05 DIAGNOSIS — Z9221 Personal history of antineoplastic chemotherapy: Secondary | ICD-10-CM | POA: Diagnosis not present

## 2024-04-05 DIAGNOSIS — Z08 Encounter for follow-up examination after completed treatment for malignant neoplasm: Secondary | ICD-10-CM | POA: Diagnosis not present

## 2024-04-05 NOTE — Progress Notes (Signed)
 numbness and tingling to feet remains unchanged.  Inquiring whether additional scans are warranted.

## 2024-04-05 NOTE — Progress Notes (Signed)
 Hematology/Oncology Consult note [facility]  [phone] [phone]  Patient Care Team: Glendia Shad, MD as PCP - General (Internal Medicine) Georgina Shasta POUR, RN as Oncology Nurse Navigator Melanee Annah BROCKS, MD as Consulting Physician (Oncology) Jordis Laneta FALCON, MD as Consulting Physician (General Surgery) Dillingham, Estefana RAMAN, DO as Attending Physician (Plastic Surgery)   Name of the patient: Eileen Hart  [id number]  June 28, 1990   Date of visit: 04/05/24  Diagnosis- clinical prognostic stage Ib invasive mammary carcinoma of the right breast cT1 cN0 M0 triple negative   Chief complaint/ Reason for visit-routine follow-up visit for breast cancer  Heme/Onc history:  Patient is a 33 year old female who underwent a bilateral diagnostic mammogram on 10/07/2022 after she felt a palpable area of concern in her right breast. Mammogram showed a 1.6 x 1 x 1.5 cm solid mass in the right breast 9:30 position 5 cm from the nipple. There was also an adjacent 6 x 9 x 2 mm mass in the right breast. No right axillary adenopathy. The dominant mass was biopsied and was consistent with invasive mammary carcinoma grade 3 ER and HER2 negative.    Bilateral breast MRI showed additional areas of concern in the right breast.  2 additional enhancing masses 1 in the 12 o'clock position and 1 in the 4 o'clock position.  2 adjacent prominent right axillary lymph nodes.  2 intermittent masses in the left breast 9 o'clock position and upper outer left breast.  This was followed by a dedicated ultrasound.  She had 3 breast biopsies in the right side and 1 left breast biopsy and all of them were negative for malignancy.  On the ultrasound her right axillary lymph nodes appeared normal and therefore biopsy was not recommended for the same.   PET CT scan showed focal hypermetabolic activity in the right breast adjacent to the biopsy clip but no other additional areas of concern.   Baseline MUGA scan showed normal EF.   Given that additional biopsies were negative for malignancy and the only biopsy-proven site was 1.6 cm triple negative breast cancer with negative lymph nodes plan was to do [facility] Taxol  chemotherapy neoadjuvant without opting for keynote 522 regimen.    Mild decrease in the size of primary breast mass after 4 cycles of AC chemotherapy. Carboplatin  added to taxol  neoadjuvantly   Patient underwent bilateral mastectomy with reconstruction on 05/08/2023.No malignancy noted in the left breast. She was found to have 0.6 cm invasive ductal carcinoma high-grade in the right breast. There was another area of fibromatoid change noted adjacent to it and the intervening parenchyma between these 2 areas showed an additional 0.4 cm invasive ductal carcinoma 2 sentinel lymph nodes negative for malignancy. No definitive response to presurgical therapy in the invasive carcinoma. Overall cancer cellularity 95%. ypT1b N0.     Interval history- Eileen Hart is a 33 year old female who presents with ongoing fatigue and scar tissue discomfort.  She experiences persistent fatigue and has not returned to her baseline energy levels. She feels generally well otherwise, with no new aches or pains.  She has discomfort related to scar tissue, which she attributes to movement and rubbing, causing an achy sensation. She consulted a plastic surgeon last week regarding this issue.  This was biopsied and was not consistent with malignancy.  She has returned to work without issues related to hand or arm movements, stating that her hands are functioning well.  Extensive blood work was done in  July, which was fairly normal. No new aches, pains, or issues with fine arm movements or hand function.  ECOG PS- 0 Pain scale- 0   Review of systems- Review of Systems  Constitutional:  Positive for malaise/fatigue. Negative for chills, fever and weight loss.  HENT:  Negative for congestion, ear  discharge and nosebleeds.   Eyes:  Negative for blurred vision.  Respiratory:  Negative for cough, hemoptysis, sputum production, shortness of breath and wheezing.   Cardiovascular:  Negative for chest pain, palpitations, orthopnea and claudication.  Gastrointestinal:  Negative for abdominal pain, blood in stool, constipation, diarrhea, heartburn, melena, nausea and vomiting.  Genitourinary:  Negative for dysuria, flank pain, frequency, hematuria and urgency.  Musculoskeletal:  Negative for back pain, joint pain and myalgias.  Skin:  Negative for rash.  Neurological:  Negative for dizziness, tingling, focal weakness, seizures, weakness and headaches.  Endo/Heme/Allergies:  Does not bruise/bleed easily.  Psychiatric/Behavioral:  Negative for depression and suicidal ideas. The patient does not have insomnia.       No Known Allergies   Past Medical History:  Diagnosis Date   Chlamydia 01/25/2010   Depression with anxiety    Gestational hypertension    resolved after delivery   Immunization, viral disease    gardasil series completed   Invasive carcinoma of breast (HCC)    right   Tobacco abuse 05/02/2016     Past Surgical History:  Procedure Laterality Date   AUGMENTATION MAMMAPLASTY Bilateral    saline implants 2015   BREAST BIOPSY Right 10/14/2022   us  biopsy/ coil clip/ path pending   BREAST BIOPSY Right 10/14/2022   US  RT BREAST BX W LOC DEV 1ST LESION IMG BX SPEC US  GUIDE 10/14/2022 [facility]   BREAST BIOPSY Left 10/29/2022   Us  Core Bx Ribbon clip- path pending   BREAST BIOPSY Right 10/29/2022   US  Core 1130 Venus Clip- path pending   BREAST BIOPSY Right 10/29/2022   Us  Core Bx Retroareolar heart clip-path pending   BREAST BIOPSY Right 10/29/2022   Us  Core Bx Ribbon Clip path pending   BREAST BIOPSY Right 10/29/2022   US  RT BREAST BX W LOC DEV EA ADD LESION IMG BX SPEC US  GUIDE 10/29/2022 [facility]   BREAST BIOPSY Right 10/29/2022   US  RT BREAST BX W  LOC DEV 1ST LESION IMG BX SPEC US  GUIDE 10/29/2022 [facility]   BREAST BIOPSY Right 10/29/2022   US  RT BREAST BX W LOC DEV EA ADD LESION IMG BX SPEC US  GUIDE 10/29/2022 [facility]   BREAST BIOPSY Left 10/29/2022   US  LT BREAST BX W LOC DEV 1ST LESION IMG BX SPEC US  GUIDE 10/29/2022 [facility]   BREAST BIOPSY Right 01/28/2024   US  RT BREAST BX W LOC DEV 1ST LESION IMG BX SPEC US  GUIDE 01/28/2024 [facility]   BREAST ENHANCEMENT SURGERY  2015   BREAST RECONSTRUCTION WITH PLACEMENT OF TISSUE EXPANDER AND FLEX HD (ACELLULAR HYDRATED DERMIS) Bilateral 05/08/2023   Procedure: Bilateral immediate breast reconstruction with expanders and Flex HD placement;  Surgeon: Lowery Estefana RAMAN, DO;  Location: [facility];  Service: Plastics;  Laterality: Bilateral;   CESAREAN SECTION  2014   MASTECTOMY W/ SENTINEL NODE BIOPSY Bilateral 05/08/2023   Procedure: MASTECTOMY Simple WITH SENTINEL LYMPH NODE BIOPSY, RNFA to assist;  Surgeon: Jordis Laneta FALCON, MD;  Location: [facility];  Service: General;  Laterality: Bilateral;   PORT-A-CATH REMOVAL  04/2023   PORTACATH PLACEMENT N/A 10/24/2022   PortA Cath removed; Procedure: INSERTION PORT-A-CATH;  Surgeon: Jordis Laneta  F, MD;  Location: [facility];  Service: General;  Laterality: N/A;   REMOVAL OF BILATERAL TISSUE EXPANDERS WITH PLACEMENT OF BILATERAL BREAST IMPLANTS Bilateral 09/08/2023   Procedure: REMOVAL, TISSUE EXPANDER, BREAST, BILATERAL, WITH BILATERAL IMPLANT IMPLANT INSERTION;  Surgeon: Lowery Estefana RAMAN, DO;  Location: [facility];  Service: Plastics;  Laterality: Bilateral;   TONSILLECTOMY      Social History   Socioeconomic History   Marital status: Single    Spouse name: Not on file   Number of children: 1   Years of education: 15   Highest education level: Not on file  Occupational History   Occupation: Groomer    Comment: [employer]  Tobacco Use   Smoking status: Former    Current packs/day: 0.00    Types: Cigarettes    Quit  date: 07/25/2018    Years since quitting: 5.7    Passive exposure: Past   Smokeless tobacco: Never   Tobacco comments:    quit 07/2018  Vaping Use   Vaping status: Former   Quit date: 07/25/2022  Substance and Sexual Activity   Alcohol use: No    Alcohol/week: 0.0 standard drinks of alcohol   Drug use: No   Sexual activity: Yes    Birth control/protection: None  Other Topics Concern   Not on file  Social History Narrative   Not on file   Social Drivers of Health   Financial Resource Strain: Low Risk  (03/08/2024)   Received from [facility]   Overall Financial Resource Strain (CARDIA)    Difficulty of Paying Living Expenses: Not very hard  Food Insecurity: No Food Insecurity (03/08/2024)   Received from [facility]   Hunger Vital Sign    Within the past 12 months, you worried that your food would run out before you got the money to buy more.: Never true    Within the past 12 months, the food you bought just didn't last and you didn't have money to get more.: Never true  Transportation Needs: No Transportation Needs (03/08/2024)   Received from [facility] - Transportation    In the past 12 months, has lack of transportation kept you from medical appointments or from getting medications?: No    Lack of Transportation (Non-Medical): No  Physical Activity: Not on file  Stress: Not on file  Social Connections: Not on file  Intimate Partner Violence: Not At Risk (05/08/2023)   Humiliation, Afraid, Rape, and Kick questionnaire    Fear of Current or Ex-Partner: No    Emotionally Abused: No    Physically Abused: No    Sexually Abused: No    Family History  Problem Relation Age of Onset   Breast cancer Other 35       mat 2nd cousin   Diabetes Neg Hx    Heart disease Neg Hx    Hypertension Neg Hx    Ovarian cancer Neg Hx    Colon cancer Neg Hx     No current outpatient medications on file.  Physical exam:   Vitals:   04/05/24 1525  BP: 123/80  Pulse: 97  Resp: 17  Temp: 98.1 F (36.7 C)  TempSrc: Tympanic  SpO2: 100%  Weight: 115 lb 11.2 oz (52.5 kg)  Height: 5' 4 (1.626 m)   Physical Exam Cardiovascular:     Rate and Rhythm: Normal rate and regular rhythm.     Heart sounds: Normal heart sounds.  Pulmonary:  Effort: Pulmonary effort is normal.     Breath sounds: Normal breath sounds.  Skin:    General: Skin is warm and dry.  Neurological:     Mental Status: She is alert and oriented to person, place, and time.      I have personally reviewed labs listed below:    Latest Ref Rng & Units 12/31/2023    8:52 AM  CMP  Glucose 70 - 99 mg/dL 93   BUN 6 - 20 mg/dL 10   Creatinine 9.55 - 1.00 mg/dL 9.33   Sodium 864 - 854 mmol/L 135   Potassium 3.5 - 5.1 mmol/L 3.9   Chloride 98 - 111 mmol/L 106   CO2 22 - 32 mmol/L 23   Calcium 8.9 - 10.3 mg/dL 8.8   Total Protein 6.5 - 8.1 g/dL 7.0   Total Bilirubin 0.0 - 1.2 mg/dL 1.0   Alkaline Phos 38 - 126 U/L 66   AST 15 - 41 U/L 17   ALT 0 - 44 U/L 13       Latest Ref Rng & Units 12/31/2023    8:52 AM  CBC  WBC 4.0 - 10.5 K/uL 4.3   Hemoglobin 12.0 - 15.0 g/dL 87.5   Hematocrit 63.9 - 46.0 % 35.5   Platelets 150 - 400 K/uL 175       Assessment and plan- Patient is a 34 y.o. female with history of stage Ib invasive mammary carcinoma of the right breast cT1 cN0 M0 triple negative status post neoadjuvant AC CarboTaxol chemotherapy. She had 2 foci of residual disease 0.6 and 0.4 cm after neoadjuvant chemotherapy.  She completed 8 cycles of adjuvant Xeloda  and this is a routine surveillance visit  Clinically patient is doing well with no concerning signs and symptoms of recurrence based on today's exam.  Her labs from July 2025 were unremarkable.  I will see her back in 6 months no labs   Visit Diagnosis 1. Encounter for follow-up surveillance of breast cancer      Dr. Annah Skene, MD, MPH [facility] [phone] 04/05/2024 4:27 PM

## 2024-04-20 ENCOUNTER — Ambulatory Visit: Admitting: Plastic Surgery

## 2024-04-23 ENCOUNTER — Encounter: Payer: Self-pay | Admitting: Oncology

## 2024-05-24 ENCOUNTER — Inpatient Hospital Stay: Attending: Oncology | Admitting: *Deleted

## 2024-05-24 DIAGNOSIS — Z9189 Other specified personal risk factors, not elsewhere classified: Secondary | ICD-10-CM

## 2024-05-24 DIAGNOSIS — Z171 Estrogen receptor negative status [ER-]: Secondary | ICD-10-CM

## 2024-05-24 NOTE — Progress Notes (Signed)
 SUBJECTIVE: Pt returns for her 3 month L-Dex screen.    PAIN:  Are you having pain? No   SOZO SCREENING: Patient was assessed today using the SOZO machine to determine the lymphedema index score. This was compared to her baseline score. It was determined that she is within the recommended range when compared to her baseline and no further action is needed at this time. She will continue SOZO screenings. These are done every 3 months for 2 years post operatively followed by every 6 months for 2 years, and then annually.     L-DEX FLOWSHEETS                L-DEX LYMPHEDEMA SCREENING    Measurement Type Unilateral     L-DEX MEASUREMENT EXTREMITY Upper Extremity     POSITION  Standing     DOMINANT SIDE Right     At Risk Side Right    BASELINE SCORE (UNILATERAL) 0.1    L-DEX SCORE (UNILATERAL) -5.4    VALUE CHANGE (UNILAT) -5.5

## 2024-07-27 ENCOUNTER — Telehealth: Payer: Self-pay | Admitting: *Deleted

## 2024-07-27 ENCOUNTER — Encounter: Payer: Self-pay | Admitting: Oncology

## 2024-07-27 DIAGNOSIS — Z171 Estrogen receptor negative status [ER-]: Secondary | ICD-10-CM

## 2024-07-27 NOTE — Telephone Encounter (Signed)
 I will call to triage

## 2024-07-27 NOTE — Telephone Encounter (Signed)
Opened duplicate encounter in error

## 2024-07-27 NOTE — Telephone Encounter (Signed)
 See triage note.

## 2024-08-16 ENCOUNTER — Ambulatory Visit: Admitting: Surgery

## 2024-08-23 ENCOUNTER — Inpatient Hospital Stay: Admitting: *Deleted

## 2024-10-11 ENCOUNTER — Inpatient Hospital Stay: Admitting: Oncology

## 2024-12-09 ENCOUNTER — Encounter: Admitting: Dermatology

## 2025-02-21 ENCOUNTER — Encounter: Admitting: Internal Medicine

## 2025-04-19 ENCOUNTER — Ambulatory Visit: Admitting: Plastic Surgery
